# Patient Record
Sex: Female | Born: 1950 | Race: Black or African American | Hispanic: No | Smoking: Current every day smoker
Health system: Southern US, Community
[De-identification: ages and names within clinical notes are randomized; demographics above are authoritative.]

## PROBLEM LIST (undated history)

## (undated) DIAGNOSIS — K219 Gastro-esophageal reflux disease without esophagitis: Secondary | ICD-10-CM

## (undated) DIAGNOSIS — E782 Mixed hyperlipidemia: Secondary | ICD-10-CM

## (undated) DIAGNOSIS — E119 Type 2 diabetes mellitus without complications: Secondary | ICD-10-CM

## (undated) DIAGNOSIS — G4730 Sleep apnea, unspecified: Secondary | ICD-10-CM

## (undated) DIAGNOSIS — G894 Chronic pain syndrome: Secondary | ICD-10-CM

## (undated) DIAGNOSIS — M170 Bilateral primary osteoarthritis of knee: Secondary | ICD-10-CM

## (undated) DIAGNOSIS — E669 Obesity, unspecified: Secondary | ICD-10-CM

## (undated) DIAGNOSIS — D333 Benign neoplasm of cranial nerves: Secondary | ICD-10-CM

## (undated) DIAGNOSIS — F419 Anxiety disorder, unspecified: Secondary | ICD-10-CM

## (undated) DIAGNOSIS — D509 Iron deficiency anemia, unspecified: Secondary | ICD-10-CM

## (undated) DIAGNOSIS — Z9889 Other specified postprocedural states: Secondary | ICD-10-CM

## (undated) DIAGNOSIS — J209 Acute bronchitis, unspecified: Secondary | ICD-10-CM

## (undated) DIAGNOSIS — I10 Essential (primary) hypertension: Secondary | ICD-10-CM

## (undated) DIAGNOSIS — Z720 Tobacco use: Secondary | ICD-10-CM

## (undated) DIAGNOSIS — E1142 Type 2 diabetes mellitus with diabetic polyneuropathy: Secondary | ICD-10-CM

## (undated) DIAGNOSIS — R42 Dizziness and giddiness: Secondary | ICD-10-CM

## (undated) DIAGNOSIS — Z79899 Other long term (current) drug therapy: Secondary | ICD-10-CM

## (undated) DIAGNOSIS — C801 Malignant (primary) neoplasm, unspecified: Secondary | ICD-10-CM

## (undated) DIAGNOSIS — I2510 Atherosclerotic heart disease of native coronary artery without angina pectoris: Secondary | ICD-10-CM

## (undated) HISTORY — DX: Atherosclerotic heart disease of native coronary artery without angina pectoris: I25.10

## (undated) HISTORY — DX: Other specified postprocedural states: Z98.89

## (undated) HISTORY — DX: Malignant (primary) neoplasm, unspecified: C80.1

## (undated) HISTORY — DX: Other long term (current) drug therapy: Z79.899

## (undated) HISTORY — PX: LIPOMA EXCISION: SHX5283

## (undated) HISTORY — DX: Chronic pain syndrome: G89.4

## (undated) HISTORY — DX: Mixed hyperlipidemia: E78.2

## (undated) HISTORY — DX: Bilateral primary osteoarthritis of knee: M17.0

## (undated) HISTORY — DX: Acute bronchitis, unspecified: J20.9

## (undated) HISTORY — DX: Type 2 diabetes mellitus without complications: E11.9

## (undated) HISTORY — DX: Dizziness and giddiness: R42

## (undated) HISTORY — DX: Other specified postprocedural states: Z98.890

## (undated) HISTORY — DX: Essential (primary) hypertension: I10

## (undated) HISTORY — DX: Obesity, unspecified: E66.9

## (undated) HISTORY — DX: Benign neoplasm of cranial nerves: D33.3

## (undated) HISTORY — DX: Tobacco use: Z72.0

## (undated) HISTORY — DX: Type 2 diabetes mellitus with diabetic polyneuropathy: E11.42

---

## 1988-04-12 HISTORY — PX: TOTAL ABDOMINAL HYSTERECTOMY W/ BILATERAL SALPINGOOPHORECTOMY: SHX83

## 1997-10-09 HISTORY — PX: OTHER SURGICAL HISTORY: SHX169

## 1998-08-05 ENCOUNTER — Encounter: Admission: RE | Admit: 1998-08-05 | Discharge: 1998-11-03 | Payer: Self-pay

## 1998-08-09 DIAGNOSIS — Z9889 Other specified postprocedural states: Secondary | ICD-10-CM

## 1998-08-09 HISTORY — DX: Other specified postprocedural states: Z98.89

## 1998-08-09 HISTORY — PX: CARPAL TUNNEL RELEASE: SHX101

## 1998-09-08 HISTORY — PX: CARPAL TUNNEL RELEASE: SHX101

## 1998-09-10 ENCOUNTER — Encounter: Admission: RE | Admit: 1998-09-10 | Discharge: 1998-12-09 | Payer: Self-pay | Admitting: Orthopedic Surgery

## 1998-11-10 ENCOUNTER — Emergency Department (HOSPITAL_COMMUNITY): Admission: EM | Admit: 1998-11-10 | Discharge: 1998-11-10 | Payer: Self-pay

## 1999-01-03 ENCOUNTER — Encounter: Admission: RE | Admit: 1999-01-03 | Discharge: 1999-01-21 | Payer: Self-pay | Admitting: Neurosurgery

## 1999-01-27 ENCOUNTER — Encounter: Admission: RE | Admit: 1999-01-27 | Discharge: 1999-02-24 | Payer: Self-pay

## 1999-04-29 ENCOUNTER — Ambulatory Visit (HOSPITAL_COMMUNITY): Admission: RE | Admit: 1999-04-29 | Discharge: 1999-04-29 | Payer: Self-pay | Admitting: Internal Medicine

## 1999-06-29 ENCOUNTER — Encounter: Payer: Self-pay | Admitting: Emergency Medicine

## 1999-06-29 ENCOUNTER — Emergency Department (HOSPITAL_COMMUNITY): Admission: EM | Admit: 1999-06-29 | Discharge: 1999-06-29 | Payer: Self-pay | Admitting: Emergency Medicine

## 1999-09-27 ENCOUNTER — Encounter: Payer: Self-pay | Admitting: Anesthesiology

## 1999-09-27 ENCOUNTER — Encounter: Admission: RE | Admit: 1999-09-27 | Discharge: 1999-12-26 | Payer: Self-pay | Admitting: Anesthesiology

## 1999-10-25 ENCOUNTER — Encounter: Payer: Self-pay | Admitting: Anesthesiology

## 2000-01-15 ENCOUNTER — Ambulatory Visit (HOSPITAL_COMMUNITY): Admission: RE | Admit: 2000-01-15 | Discharge: 2000-01-15 | Payer: Self-pay | Admitting: Neurosurgery

## 2000-10-09 DIAGNOSIS — M810 Age-related osteoporosis without current pathological fracture: Secondary | ICD-10-CM

## 2002-10-09 HISTORY — PX: CORONARY ANGIOPLASTY WITH STENT PLACEMENT: SHX49

## 2005-03-02 ENCOUNTER — Inpatient Hospital Stay (HOSPITAL_COMMUNITY): Admission: EM | Admit: 2005-03-02 | Discharge: 2005-03-03 | Payer: Self-pay | Admitting: Emergency Medicine

## 2005-03-03 ENCOUNTER — Ambulatory Visit: Payer: Self-pay | Admitting: Cardiology

## 2005-05-10 ENCOUNTER — Ambulatory Visit: Payer: Self-pay | Admitting: Cardiology

## 2005-08-23 ENCOUNTER — Ambulatory Visit: Payer: Self-pay | Admitting: Internal Medicine

## 2005-10-20 ENCOUNTER — Ambulatory Visit: Payer: Self-pay | Admitting: Internal Medicine

## 2005-10-26 ENCOUNTER — Ambulatory Visit: Payer: Self-pay | Admitting: Internal Medicine

## 2005-11-03 ENCOUNTER — Ambulatory Visit: Payer: Self-pay | Admitting: Cardiology

## 2005-11-16 ENCOUNTER — Encounter: Admission: RE | Admit: 2005-11-16 | Discharge: 2006-02-14 | Payer: Self-pay | Admitting: Internal Medicine

## 2005-11-22 ENCOUNTER — Ambulatory Visit: Payer: Self-pay | Admitting: Internal Medicine

## 2005-12-26 ENCOUNTER — Ambulatory Visit: Payer: Self-pay | Admitting: Internal Medicine

## 2006-02-16 ENCOUNTER — Ambulatory Visit: Payer: Self-pay | Admitting: Family Medicine

## 2006-02-27 ENCOUNTER — Encounter: Admission: RE | Admit: 2006-02-27 | Discharge: 2006-02-27 | Payer: Self-pay | Admitting: Family Medicine

## 2006-03-08 ENCOUNTER — Ambulatory Visit: Payer: Self-pay | Admitting: Internal Medicine

## 2006-03-25 ENCOUNTER — Emergency Department (HOSPITAL_COMMUNITY): Admission: EM | Admit: 2006-03-25 | Discharge: 2006-03-25 | Payer: Self-pay | Admitting: Emergency Medicine

## 2006-03-29 ENCOUNTER — Ambulatory Visit: Payer: Self-pay | Admitting: Family Medicine

## 2006-04-17 ENCOUNTER — Ambulatory Visit: Admission: RE | Admit: 2006-04-17 | Discharge: 2006-04-17 | Payer: Self-pay | Admitting: Internal Medicine

## 2006-04-19 ENCOUNTER — Ambulatory Visit: Payer: Self-pay

## 2006-04-24 ENCOUNTER — Ambulatory Visit: Payer: Self-pay | Admitting: Internal Medicine

## 2006-04-27 ENCOUNTER — Ambulatory Visit (HOSPITAL_COMMUNITY): Admission: RE | Admit: 2006-04-27 | Discharge: 2006-04-27 | Payer: Self-pay | Admitting: Internal Medicine

## 2006-05-15 ENCOUNTER — Ambulatory Visit: Payer: Self-pay | Admitting: Internal Medicine

## 2006-05-17 ENCOUNTER — Ambulatory Visit: Payer: Self-pay | Admitting: Cardiology

## 2006-06-01 ENCOUNTER — Ambulatory Visit: Payer: Self-pay | Admitting: Internal Medicine

## 2006-06-03 ENCOUNTER — Emergency Department (HOSPITAL_COMMUNITY): Admission: EM | Admit: 2006-06-03 | Discharge: 2006-06-03 | Payer: Self-pay | Admitting: Emergency Medicine

## 2006-06-05 ENCOUNTER — Ambulatory Visit: Payer: Self-pay | Admitting: Internal Medicine

## 2006-06-12 ENCOUNTER — Ambulatory Visit: Payer: Self-pay | Admitting: Internal Medicine

## 2006-06-19 ENCOUNTER — Encounter: Admission: RE | Admit: 2006-06-19 | Discharge: 2006-07-12 | Payer: Self-pay | Admitting: Orthopedic Surgery

## 2006-08-17 ENCOUNTER — Ambulatory Visit: Payer: Self-pay | Admitting: Internal Medicine

## 2006-08-21 ENCOUNTER — Ambulatory Visit: Payer: Self-pay | Admitting: Internal Medicine

## 2006-09-06 ENCOUNTER — Encounter: Admission: RE | Admit: 2006-09-06 | Discharge: 2006-09-19 | Payer: Self-pay | Admitting: Orthopedic Surgery

## 2006-11-08 ENCOUNTER — Ambulatory Visit: Payer: Self-pay | Admitting: Cardiology

## 2006-11-09 ENCOUNTER — Ambulatory Visit: Payer: Self-pay | Admitting: Internal Medicine

## 2006-11-11 ENCOUNTER — Ambulatory Visit (HOSPITAL_COMMUNITY): Admission: RE | Admit: 2006-11-11 | Discharge: 2006-11-11 | Payer: Self-pay | Admitting: Internal Medicine

## 2006-11-26 ENCOUNTER — Ambulatory Visit: Payer: Self-pay | Admitting: Internal Medicine

## 2006-12-18 ENCOUNTER — Encounter: Admission: RE | Admit: 2006-12-18 | Discharge: 2007-01-23 | Payer: Self-pay | Admitting: Orthopedic Surgery

## 2006-12-20 ENCOUNTER — Ambulatory Visit: Payer: Self-pay | Admitting: Internal Medicine

## 2007-02-08 ENCOUNTER — Ambulatory Visit: Payer: Self-pay | Admitting: Internal Medicine

## 2007-03-20 ENCOUNTER — Ambulatory Visit (HOSPITAL_COMMUNITY): Admission: RE | Admit: 2007-03-20 | Discharge: 2007-03-20 | Payer: Self-pay | Admitting: Internal Medicine

## 2007-04-02 ENCOUNTER — Ambulatory Visit: Payer: Self-pay | Admitting: Internal Medicine

## 2007-05-27 ENCOUNTER — Ambulatory Visit: Payer: Self-pay | Admitting: Cardiology

## 2007-10-15 ENCOUNTER — Ambulatory Visit: Payer: Self-pay | Admitting: Internal Medicine

## 2007-10-15 DIAGNOSIS — I2510 Atherosclerotic heart disease of native coronary artery without angina pectoris: Secondary | ICD-10-CM

## 2007-10-15 DIAGNOSIS — G894 Chronic pain syndrome: Secondary | ICD-10-CM | POA: Insufficient documentation

## 2007-10-15 DIAGNOSIS — M1710 Unilateral primary osteoarthritis, unspecified knee: Secondary | ICD-10-CM

## 2007-10-15 DIAGNOSIS — I10 Essential (primary) hypertension: Secondary | ICD-10-CM

## 2007-10-15 DIAGNOSIS — F17200 Nicotine dependence, unspecified, uncomplicated: Secondary | ICD-10-CM | POA: Insufficient documentation

## 2007-10-15 DIAGNOSIS — J209 Acute bronchitis, unspecified: Secondary | ICD-10-CM

## 2007-10-15 DIAGNOSIS — E782 Mixed hyperlipidemia: Secondary | ICD-10-CM

## 2007-10-15 DIAGNOSIS — E119 Type 2 diabetes mellitus without complications: Secondary | ICD-10-CM | POA: Insufficient documentation

## 2007-10-15 DIAGNOSIS — E1140 Type 2 diabetes mellitus with diabetic neuropathy, unspecified: Secondary | ICD-10-CM

## 2007-10-15 DIAGNOSIS — E669 Obesity, unspecified: Secondary | ICD-10-CM | POA: Insufficient documentation

## 2007-10-15 LAB — CONVERTED CEMR LAB
Bilirubin Urine: NEGATIVE
Blood Glucose, Fingerstick: 104
Glucose, Urine, Semiquant: NEGATIVE
Specific Gravity, Urine: <1.005
Urobilinogen, UA: NEGATIVE
pH: 7.5

## 2007-10-16 ENCOUNTER — Encounter (INDEPENDENT_AMBULATORY_CARE_PROVIDER_SITE_OTHER): Payer: Self-pay | Admitting: Internal Medicine

## 2007-10-17 ENCOUNTER — Encounter (INDEPENDENT_AMBULATORY_CARE_PROVIDER_SITE_OTHER): Payer: Self-pay | Admitting: Internal Medicine

## 2007-10-17 LAB — CONVERTED CEMR LAB
ALT: 8 units/L (ref 0–35)
Albumin: 4.5 g/dL (ref 3.5–5.2)
Basophils Absolute: 0 10*3/uL (ref 0.0–0.1)
CO2: 28 meq/L (ref 19–32)
Calcium: 9.6 mg/dL (ref 8.4–10.5)
Chloride: 102 meq/L (ref 96–112)
Cholesterol: 185 mg/dL (ref 0–200)
Creatinine, Ser: 0.74 mg/dL (ref 0.40–1.20)
Hemoglobin: 13 g/dL (ref 12.0–15.0)
Lymphocytes Relative: 22 % (ref 12–46)
Monocytes Absolute: 0.6 10*3/uL (ref 0.1–1.0)
Neutro Abs: 5.2 10*3/uL (ref 1.7–7.7)
Potassium: 3.8 meq/L (ref 3.5–5.3)
RBC: 4.59 M/uL (ref 3.87–5.11)
RDW: 14 % (ref 11.5–15.5)
Total Protein: 7.7 g/dL (ref 6.0–8.3)

## 2007-10-22 ENCOUNTER — Ambulatory Visit: Payer: Self-pay | Admitting: Internal Medicine

## 2007-10-22 LAB — CONVERTED CEMR LAB: Blood Glucose, Fingerstick: 117

## 2007-11-04 ENCOUNTER — Telehealth (INDEPENDENT_AMBULATORY_CARE_PROVIDER_SITE_OTHER): Payer: Self-pay | Admitting: Internal Medicine

## 2007-11-21 ENCOUNTER — Telehealth (INDEPENDENT_AMBULATORY_CARE_PROVIDER_SITE_OTHER): Payer: Self-pay | Admitting: Internal Medicine

## 2008-04-02 ENCOUNTER — Telehealth (INDEPENDENT_AMBULATORY_CARE_PROVIDER_SITE_OTHER): Payer: Self-pay | Admitting: Internal Medicine

## 2008-04-03 ENCOUNTER — Ambulatory Visit: Payer: Self-pay | Admitting: Internal Medicine

## 2008-04-03 LAB — CONVERTED CEMR LAB
AST: 13 units/L (ref 0–37)
Albumin: 4.2 g/dL (ref 3.5–5.2)
Alkaline Phosphatase: 38 units/L — ABNORMAL LOW (ref 39–117)
Cholesterol: 137 mg/dL (ref 0–200)
HDL: 45 mg/dL (ref >39)
Indirect Bilirubin: 0.3 mg/dL (ref 0.0–0.9)
Total Protein: 7 g/dL (ref 6.0–8.3)
Triglycerides: 71 mg/dL (ref <150)

## 2008-04-16 ENCOUNTER — Ambulatory Visit (HOSPITAL_COMMUNITY): Admission: RE | Admit: 2008-04-16 | Discharge: 2008-04-16 | Payer: Self-pay | Admitting: Internal Medicine

## 2008-04-23 ENCOUNTER — Encounter (INDEPENDENT_AMBULATORY_CARE_PROVIDER_SITE_OTHER): Payer: Self-pay | Admitting: Internal Medicine

## 2008-05-18 ENCOUNTER — Encounter (INDEPENDENT_AMBULATORY_CARE_PROVIDER_SITE_OTHER): Payer: Self-pay | Admitting: Internal Medicine

## 2008-07-03 ENCOUNTER — Ambulatory Visit: Payer: Self-pay | Admitting: Internal Medicine

## 2008-07-05 ENCOUNTER — Telehealth (INDEPENDENT_AMBULATORY_CARE_PROVIDER_SITE_OTHER): Payer: Self-pay | Admitting: Internal Medicine

## 2008-07-07 ENCOUNTER — Telehealth (INDEPENDENT_AMBULATORY_CARE_PROVIDER_SITE_OTHER): Payer: Self-pay | Admitting: Internal Medicine

## 2008-07-17 ENCOUNTER — Telehealth (INDEPENDENT_AMBULATORY_CARE_PROVIDER_SITE_OTHER): Payer: Self-pay | Admitting: Internal Medicine

## 2008-07-20 ENCOUNTER — Encounter (INDEPENDENT_AMBULATORY_CARE_PROVIDER_SITE_OTHER): Payer: Self-pay | Admitting: Internal Medicine

## 2008-07-20 ENCOUNTER — Ambulatory Visit (HOSPITAL_COMMUNITY): Admission: RE | Admit: 2008-07-20 | Discharge: 2008-07-20 | Payer: Self-pay | Admitting: Internal Medicine

## 2008-07-23 ENCOUNTER — Encounter (INDEPENDENT_AMBULATORY_CARE_PROVIDER_SITE_OTHER): Payer: Self-pay | Admitting: Internal Medicine

## 2008-07-30 ENCOUNTER — Encounter (INDEPENDENT_AMBULATORY_CARE_PROVIDER_SITE_OTHER): Payer: Self-pay | Admitting: *Deleted

## 2008-08-02 ENCOUNTER — Encounter (INDEPENDENT_AMBULATORY_CARE_PROVIDER_SITE_OTHER): Payer: Self-pay | Admitting: Internal Medicine

## 2008-08-05 ENCOUNTER — Telehealth (INDEPENDENT_AMBULATORY_CARE_PROVIDER_SITE_OTHER): Payer: Self-pay | Admitting: Internal Medicine

## 2008-08-15 ENCOUNTER — Telehealth (INDEPENDENT_AMBULATORY_CARE_PROVIDER_SITE_OTHER): Payer: Self-pay | Admitting: Family Medicine

## 2008-08-27 ENCOUNTER — Telehealth (INDEPENDENT_AMBULATORY_CARE_PROVIDER_SITE_OTHER): Payer: Self-pay | Admitting: Internal Medicine

## 2008-09-09 ENCOUNTER — Telehealth (INDEPENDENT_AMBULATORY_CARE_PROVIDER_SITE_OTHER): Payer: Self-pay | Admitting: Internal Medicine

## 2008-11-06 ENCOUNTER — Ambulatory Visit: Payer: Self-pay | Admitting: Internal Medicine

## 2008-11-06 DIAGNOSIS — R42 Dizziness and giddiness: Secondary | ICD-10-CM

## 2008-11-06 LAB — CONVERTED CEMR LAB
Blood Glucose, Fingerstick: 104
Blood in Urine, dipstick: NEGATIVE
Hgb A1c MFr Bld: 6.2 %
Microalb, Ur: 1.1 mg/dL (ref 0.00–1.89)
Nitrite: NEGATIVE
Protein, U semiquant: NEGATIVE

## 2008-11-19 ENCOUNTER — Ambulatory Visit: Payer: Self-pay | Admitting: Internal Medicine

## 2008-12-07 ENCOUNTER — Telehealth (INDEPENDENT_AMBULATORY_CARE_PROVIDER_SITE_OTHER): Payer: Self-pay | Admitting: Internal Medicine

## 2008-12-07 LAB — CONVERTED CEMR LAB
BUN: 15 mg/dL (ref 6–23)
CO2: 27 meq/L (ref 19–32)
Calcium: 10 mg/dL (ref 8.4–10.5)
Chloride: 101 meq/L (ref 96–112)
Cholesterol: 162 mg/dL (ref 0–200)
Creatinine, Ser: 0.69 mg/dL (ref 0.40–1.20)
Eosinophils Relative: 4 % (ref 0–5)
HCT: 40.6 % (ref 36.0–46.0)
HDL: 56 mg/dL (ref >39)
Hemoglobin: 12.8 g/dL (ref 12.0–15.0)
Lymphocytes Relative: 26 % (ref 12–46)
MCHC: 31.5 g/dL (ref 30.0–36.0)
Monocytes Absolute: 0.6 10*3/uL (ref 0.1–1.0)
Monocytes Relative: 8 % (ref 3–12)
Neutro Abs: 4.5 10*3/uL (ref 1.7–7.7)
RBC: 4.52 M/uL (ref 3.87–5.11)
RDW: 13.2 % (ref 11.5–15.5)
Total Bilirubin: 0.3 mg/dL (ref 0.3–1.2)
Total CHOL/HDL Ratio: 2.9
Triglycerides: 86 mg/dL (ref <150)
VLDL: 17 mg/dL (ref 0–40)

## 2008-12-10 ENCOUNTER — Encounter: Payer: Self-pay | Admitting: Cardiovascular Disease

## 2008-12-10 ENCOUNTER — Ambulatory Visit: Payer: Self-pay | Admitting: Cardiovascular Disease

## 2008-12-10 DIAGNOSIS — I2510 Atherosclerotic heart disease of native coronary artery without angina pectoris: Secondary | ICD-10-CM

## 2008-12-11 ENCOUNTER — Encounter (INDEPENDENT_AMBULATORY_CARE_PROVIDER_SITE_OTHER): Payer: Self-pay | Admitting: Internal Medicine

## 2008-12-17 ENCOUNTER — Telehealth (INDEPENDENT_AMBULATORY_CARE_PROVIDER_SITE_OTHER): Payer: Self-pay | Admitting: *Deleted

## 2008-12-21 ENCOUNTER — Encounter: Payer: Self-pay | Admitting: Cardiovascular Disease

## 2008-12-21 ENCOUNTER — Ambulatory Visit: Payer: Self-pay

## 2008-12-30 ENCOUNTER — Telehealth (INDEPENDENT_AMBULATORY_CARE_PROVIDER_SITE_OTHER): Payer: Self-pay | Admitting: Internal Medicine

## 2009-01-07 ENCOUNTER — Telehealth (INDEPENDENT_AMBULATORY_CARE_PROVIDER_SITE_OTHER): Payer: Self-pay | Admitting: Internal Medicine

## 2009-01-19 ENCOUNTER — Telehealth (INDEPENDENT_AMBULATORY_CARE_PROVIDER_SITE_OTHER): Payer: Self-pay | Admitting: *Deleted

## 2009-01-25 ENCOUNTER — Encounter (INDEPENDENT_AMBULATORY_CARE_PROVIDER_SITE_OTHER): Payer: Self-pay | Admitting: Internal Medicine

## 2009-01-28 ENCOUNTER — Telehealth (INDEPENDENT_AMBULATORY_CARE_PROVIDER_SITE_OTHER): Payer: Self-pay | Admitting: Internal Medicine

## 2009-02-01 ENCOUNTER — Ambulatory Visit: Payer: Self-pay | Admitting: Internal Medicine

## 2009-02-01 LAB — CONVERTED CEMR LAB
Bilirubin, Direct: 0.1 mg/dL (ref 0.0–0.3)
Indirect Bilirubin: 0.3 mg/dL (ref 0.0–0.9)
LDL Cholesterol: 64 mg/dL (ref 0–99)
Total Bilirubin: 0.4 mg/dL (ref 0.3–1.2)
Total Protein: 7.3 g/dL (ref 6.0–8.3)
VLDL: 21 mg/dL (ref 0–40)

## 2009-02-05 ENCOUNTER — Encounter (INDEPENDENT_AMBULATORY_CARE_PROVIDER_SITE_OTHER): Payer: Self-pay | Admitting: Internal Medicine

## 2009-02-08 ENCOUNTER — Encounter (INDEPENDENT_AMBULATORY_CARE_PROVIDER_SITE_OTHER): Payer: Self-pay | Admitting: Internal Medicine

## 2009-03-09 ENCOUNTER — Encounter (INDEPENDENT_AMBULATORY_CARE_PROVIDER_SITE_OTHER): Payer: Self-pay | Admitting: Internal Medicine

## 2009-03-24 ENCOUNTER — Encounter (INDEPENDENT_AMBULATORY_CARE_PROVIDER_SITE_OTHER): Payer: Self-pay | Admitting: Internal Medicine

## 2009-03-25 ENCOUNTER — Telehealth (INDEPENDENT_AMBULATORY_CARE_PROVIDER_SITE_OTHER): Payer: Self-pay | Admitting: Internal Medicine

## 2009-04-03 ENCOUNTER — Emergency Department (HOSPITAL_COMMUNITY): Admission: EM | Admit: 2009-04-03 | Discharge: 2009-04-03 | Payer: Self-pay | Admitting: Emergency Medicine

## 2009-04-09 ENCOUNTER — Telehealth (INDEPENDENT_AMBULATORY_CARE_PROVIDER_SITE_OTHER): Payer: Self-pay | Admitting: Internal Medicine

## 2009-04-14 ENCOUNTER — Emergency Department (HOSPITAL_COMMUNITY): Admission: EM | Admit: 2009-04-14 | Discharge: 2009-04-14 | Payer: Self-pay | Admitting: Emergency Medicine

## 2009-04-20 ENCOUNTER — Encounter: Admission: RE | Admit: 2009-04-20 | Discharge: 2009-07-01 | Payer: Self-pay | Admitting: Internal Medicine

## 2009-05-05 ENCOUNTER — Encounter (INDEPENDENT_AMBULATORY_CARE_PROVIDER_SITE_OTHER): Payer: Self-pay | Admitting: Internal Medicine

## 2009-05-21 ENCOUNTER — Emergency Department (HOSPITAL_COMMUNITY): Admission: EM | Admit: 2009-05-21 | Discharge: 2009-05-21 | Payer: Self-pay | Admitting: Emergency Medicine

## 2009-06-17 ENCOUNTER — Ambulatory Visit: Payer: Self-pay | Admitting: Cardiovascular Disease

## 2009-06-22 ENCOUNTER — Ambulatory Visit: Payer: Self-pay | Admitting: Family Medicine

## 2009-06-30 ENCOUNTER — Encounter (INDEPENDENT_AMBULATORY_CARE_PROVIDER_SITE_OTHER): Payer: Self-pay | Admitting: Internal Medicine

## 2009-07-19 ENCOUNTER — Encounter (INDEPENDENT_AMBULATORY_CARE_PROVIDER_SITE_OTHER): Payer: Self-pay | Admitting: Internal Medicine

## 2009-08-23 ENCOUNTER — Ambulatory Visit: Payer: Self-pay | Admitting: Physical Medicine & Rehabilitation

## 2009-08-23 ENCOUNTER — Encounter
Admission: RE | Admit: 2009-08-23 | Discharge: 2009-10-07 | Payer: Self-pay | Admitting: Physical Medicine & Rehabilitation

## 2009-08-27 ENCOUNTER — Ambulatory Visit (HOSPITAL_COMMUNITY)
Admission: RE | Admit: 2009-08-27 | Discharge: 2009-08-27 | Payer: Self-pay | Admitting: Physical Medicine & Rehabilitation

## 2009-09-10 ENCOUNTER — Ambulatory Visit: Payer: Self-pay | Admitting: Physical Medicine & Rehabilitation

## 2009-09-16 ENCOUNTER — Encounter (INDEPENDENT_AMBULATORY_CARE_PROVIDER_SITE_OTHER): Payer: Self-pay | Admitting: Internal Medicine

## 2009-09-18 ENCOUNTER — Ambulatory Visit (HOSPITAL_COMMUNITY)
Admission: RE | Admit: 2009-09-18 | Discharge: 2009-09-18 | Payer: Self-pay | Admitting: Physical Medicine & Rehabilitation

## 2009-10-07 ENCOUNTER — Ambulatory Visit: Payer: Self-pay | Admitting: Physical Medicine & Rehabilitation

## 2009-11-02 ENCOUNTER — Encounter
Admission: RE | Admit: 2009-11-02 | Discharge: 2009-11-02 | Payer: Self-pay | Admitting: Physical Medicine & Rehabilitation

## 2009-12-21 ENCOUNTER — Ambulatory Visit (HOSPITAL_COMMUNITY): Admission: RE | Admit: 2009-12-21 | Discharge: 2009-12-21 | Payer: Self-pay | Admitting: Urology

## 2009-12-21 ENCOUNTER — Encounter: Admission: RE | Admit: 2009-12-21 | Discharge: 2009-12-21 | Payer: Self-pay | Admitting: Internal Medicine

## 2009-12-21 ENCOUNTER — Encounter: Payer: Self-pay | Admitting: Cardiovascular Disease

## 2010-01-03 ENCOUNTER — Ambulatory Visit: Payer: Self-pay | Admitting: Cardiovascular Disease

## 2010-01-12 ENCOUNTER — Telehealth (INDEPENDENT_AMBULATORY_CARE_PROVIDER_SITE_OTHER): Payer: Self-pay | Admitting: *Deleted

## 2010-01-17 ENCOUNTER — Encounter (INDEPENDENT_AMBULATORY_CARE_PROVIDER_SITE_OTHER): Payer: Self-pay | Admitting: Urology

## 2010-01-17 ENCOUNTER — Inpatient Hospital Stay (HOSPITAL_COMMUNITY): Admission: RE | Admit: 2010-01-17 | Discharge: 2010-01-20 | Payer: Self-pay | Admitting: Urology

## 2010-02-11 ENCOUNTER — Encounter
Admission: RE | Admit: 2010-02-11 | Discharge: 2010-05-05 | Payer: Self-pay | Admitting: Physical Medicine & Rehabilitation

## 2010-02-11 ENCOUNTER — Telehealth (INDEPENDENT_AMBULATORY_CARE_PROVIDER_SITE_OTHER): Payer: Self-pay | Admitting: Internal Medicine

## 2010-02-15 ENCOUNTER — Ambulatory Visit: Payer: Self-pay | Admitting: Physical Medicine & Rehabilitation

## 2010-03-21 ENCOUNTER — Ambulatory Visit: Payer: Self-pay | Admitting: Physical Medicine & Rehabilitation

## 2010-04-13 ENCOUNTER — Encounter: Admission: RE | Admit: 2010-04-13 | Discharge: 2010-04-13 | Payer: Self-pay | Admitting: Internal Medicine

## 2010-05-05 ENCOUNTER — Ambulatory Visit: Payer: Self-pay | Admitting: Physical Medicine & Rehabilitation

## 2010-05-19 ENCOUNTER — Encounter
Admission: RE | Admit: 2010-05-19 | Discharge: 2010-08-17 | Payer: Self-pay | Source: Home / Self Care | Admitting: Physical Medicine & Rehabilitation

## 2010-05-20 ENCOUNTER — Ambulatory Visit: Payer: Self-pay | Admitting: Physical Medicine & Rehabilitation

## 2010-06-17 ENCOUNTER — Ambulatory Visit: Payer: Self-pay | Admitting: Physical Medicine & Rehabilitation

## 2010-06-27 ENCOUNTER — Ambulatory Visit: Payer: Self-pay | Admitting: Physical Medicine & Rehabilitation

## 2010-07-12 ENCOUNTER — Ambulatory Visit: Payer: Self-pay | Admitting: Cardiovascular Disease

## 2010-07-12 DIAGNOSIS — R609 Edema, unspecified: Secondary | ICD-10-CM

## 2010-07-18 ENCOUNTER — Ambulatory Visit: Payer: Self-pay | Admitting: Internal Medicine

## 2010-07-18 ENCOUNTER — Ambulatory Visit: Payer: Self-pay

## 2010-07-18 ENCOUNTER — Encounter: Payer: Self-pay | Admitting: Cardiovascular Disease

## 2010-07-18 ENCOUNTER — Ambulatory Visit (HOSPITAL_COMMUNITY): Admission: RE | Admit: 2010-07-18 | Discharge: 2010-07-18 | Payer: Self-pay | Admitting: Cardiovascular Disease

## 2010-07-22 ENCOUNTER — Ambulatory Visit: Payer: Self-pay | Admitting: Physical Medicine & Rehabilitation

## 2010-08-04 ENCOUNTER — Ambulatory Visit: Payer: Self-pay | Admitting: Physical Medicine & Rehabilitation

## 2010-08-16 ENCOUNTER — Ambulatory Visit: Payer: Self-pay | Admitting: Cardiovascular Disease

## 2010-08-19 ENCOUNTER — Ambulatory Visit (HOSPITAL_COMMUNITY): Admission: RE | Admit: 2010-08-19 | Discharge: 2010-08-19 | Payer: Self-pay | Admitting: Urology

## 2010-08-30 ENCOUNTER — Encounter
Admission: RE | Admit: 2010-08-30 | Discharge: 2010-11-08 | Payer: Self-pay | Source: Home / Self Care | Attending: Physical Medicine & Rehabilitation | Admitting: Physical Medicine & Rehabilitation

## 2010-09-08 ENCOUNTER — Ambulatory Visit: Payer: Self-pay | Admitting: Physical Medicine & Rehabilitation

## 2010-10-11 ENCOUNTER — Encounter
Admission: RE | Admit: 2010-10-11 | Discharge: 2010-10-17 | Payer: Self-pay | Source: Home / Self Care | Attending: Physical Medicine & Rehabilitation | Admitting: Physical Medicine & Rehabilitation

## 2010-10-17 ENCOUNTER — Ambulatory Visit
Admission: RE | Admit: 2010-10-17 | Discharge: 2010-10-17 | Payer: Self-pay | Source: Home / Self Care | Attending: Physical Medicine & Rehabilitation | Admitting: Physical Medicine & Rehabilitation

## 2010-10-30 ENCOUNTER — Encounter: Payer: Self-pay | Admitting: Internal Medicine

## 2010-10-30 ENCOUNTER — Encounter: Payer: Self-pay | Admitting: Physical Medicine & Rehabilitation

## 2010-11-08 NOTE — Assessment & Plan Note (Signed)
Summary: 1 month rov/sl   Visit Type:  1 month f/u Primary Provider:  Victorino December   CC:  ankles tender/ red spot/ left side neck / face pain.Marland KitchenMarland KitchenMarland KitchenMarland Kitchenpt denies any other caridac complaints.  History of Present Illness: 60 yo AAF with h/o CAD s/p MI and stents x 3 in 2004, HTN, Hyperlipidemia, DM type II who presents today for a routine visit. Echo in March 2010 showed normal LV size and function with EF of 60% and mild aortic valve calcification. Her stress myoview showed no ischemia at that time.  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   She has been doing well. Swelling in feet is unchanged. Echo showed normal LV size and function without any significant valvular problems. She is active and describes no chest pain, SOB, palpitations.  She does describe facial and neck pain yesterday, described as tightness but no associated SOB, palpitaitons or chest pain. Her lower ext swelling improves with compression stockings and leg elevation.   Current Medications (verified): 1)  Celexa 40 Mg  Tabs (Citalopram Hydrobromide) .... One Tablet By Mouth Daily **needs Office Visit** 2)  Aspirin 81 Mg Tbec (Aspirin) .... Take One Tablet By Mouth Daily 3)  Metformin Hcl 1000 Mg Tabs (Metformin Hcl) .... Take 1 Tablet By Mouth Two Times A Day 4)  Diovan Hct 160-12.5 Mg  Tabs (Valsartan-Hydrochlorothiazide) .Marland Kitchen.. 1 By Mouth Two Times A Day 5)  Metoprolol Succinate 50 Mg  Tb24 (Metoprolol Succinate) .Marland Kitchen.. 1 By Mouth Two Times A Day 6)  Nitrolingual 0.4 Mg/spray Soln (Nitroglycerin) .... One Spray Under Tongue Every 5 Minutes As Needed For Chest Pain---May Repeat Times Three 7)  Alendronate Sodium 70 Mg  Tabs (Alendronate Sodium) .Marland Kitchen.. 1 By Mouth Q Week 8)  Simvastatin 40 Mg Tabs (Simvastatin) .Marland Kitchen.. 1 Tab At Bedtime 9)  Omeprazole 40 Mg Cpdr (Omeprazole) .... Take One Tablet By Mouth Once Daily. 10)  Vitamin D 2000 Unit Tabs (Cholecalciferol) .... Take One Tablet By  Mouth Once Daily. 11)  Fish Oil 1200 Mg Caps (Omega-3 Fatty Acids) .Marland Kitchen.. 1 Cap Once Daily 12)  Lyrica 50 Mg Caps (Pregabalin) .Marland Kitchen.. 1 Cap Three Times A Day  Allergies (verified): No Known Drug Allergies  Past History:  Past Medical History: Reviewed history from 12/10/2008 and no changes required. 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 Divorced. Disability mainly for back and chronic intermittent vertigo--since tumor removed--no surgical correction for back. Lives alone, keeps her grandson--28 yo daughter is mother.   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 Tobacco use at 1ppd for 40 years, currently 3-4 cigarettes per day.  Review of Systems       The patient complains of leg swelling.  The patient denies fatigue, malaise, fever, weight gain/loss, vision loss, decreased hearing, hoarseness, chest pain, palpitations, shortness of breath, prolonged cough, wheezing, sleep apnea, coughing up blood, abdominal pain, blood in stool, nausea, vomiting, diarrhea, heartburn, incontinence, blood in urine, muscle weakness, joint pain, rash, skin lesions, headache, fainting, dizziness, depression, anxiety, enlarged lymph nodes, easy bruising or bleeding, and environmental allergies.  Vital Signs:  Patient profile:   60 year old female Menstrual status:  hysterectomy Height:      63 inches Weight:       163.4 pounds BMI:     29.05 Pulse rate:   62 / minute Pulse rhythm:   regular Resp:     18 per minute BP sitting:   102 / 80  (left arm) Cuff size:   large  Vitals Entered By: Danielle Rankin, CMA (August 16, 2010 10:12 AM)  Physical Exam  General:  General: Well developed, well nourished, NAD Musculoskeletal: Muscle strength 5/5 all ext Psychiatric: Mood and affect normal Neck: No JVD, no carotid bruits, no thyromegaly, no lymphadenopathy. Lungs:Clear bilaterally, no wheezes, rhonci, crackles CV: RRR no murmurs, gallops rubs Abdomen: soft, NT, ND, BS present Extremities: 1+ bilateral lower ext  edema, pulses 2+.    Echocardiogram  Procedure date:  07/18/2010  Findings:       Left ventricle: The cavity size was normal. Wall thickness was   normal. Systolic function was normal. The estimated ejection   fraction was in the range of 55% to 60%. Doppler parameters are   consistent with abnormal left ventricular relaxation (grade 1   diastolic dysfunction)  Impression & Recommendations:  Problem # 1:  CAD, NATIVE VESSEL (ICD-414.01) Stable. No changes in therapy.   Her updated medication list for this problem includes:    Aspirin 81 Mg Tbec (Aspirin) .Marland Kitchen... Take one tablet by mouth daily    Metoprolol Succinate 50 Mg Tb24 (Metoprolol succinate) .Marland Kitchen... 1 by mouth two times a day    Nitrolingual 0.4 Mg/spray Soln (Nitroglycerin) ..... One spray under tongue every 5 minutes as needed for chest pain---may repeat times three  Problem # 2:  EDEMA (ICD-782.3) Likely dependent edema. Compression stockings, salt reduction and leg elevation recommended. No evidence of LV dysfunction.   Patient Instructions: 1)  Your physician recommends that you schedule a follow-up appointment in: 6 months 2)  Your physician recommends that you continue on your current medications as directed. Please refer to the Current Medication list given to you today. Prescriptions: NITROLINGUAL 0.4 MG/SPRAY  SOLN (NITROGLYCERIN) One spray under tongue every 5 minutes as needed for chest pain---may repeat times three  #25 x 6   Entered by:   Whitney Maeola Sarah RN   Authorized by:   Verne Carrow, MD   Signed by:   Ellender Hose RN on 08/16/2010   Method used:   [facility]        [facility] [address] [phone]* (retail)       [address]       [place], [place]  [address]       Ph: [phone] or [phone]       Fax: [phone]   RxID:   [id number]

## 2010-11-08 NOTE — Assessment & Plan Note (Signed)
Summary: f16m   Visit Type:  f/u visit Primary Provider:  Sander,Robyn   CC:  No cardiac complaints.  History of Present Illness: 60 yo AAF with h/o CAD s/p MI and stents x 3 in 2004, HTN, Hyperlipidemia, DM type II who presents today for a routine visit. At the visit in March of 2010, I ordered an echo which showed normal LV size and function with EF of 60% and mild aortic valve calcification. Her stress myoview showed no ischemia at that time.  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   She has been feeling great. She is active and describes no chest pain, SOB, palpitations. She has chronic issues with rectal pain and when it subsides, she gets dizzy and weak and passes out. This has been happening for the last ten years. It has not been felt to be cardiac in the past.  Recent carotids as above with no severe disease. Colonoscopy five years ago was ok per patient. She has been taking all of her medications as prescribed. She keeps her grandson and this keeps her busy. She tells me that a spot has been discovered on her kidney and she has an upcoming surgery with planned right nephrectomy on April 11th with Dr. Laverle Patter at [facility].      Current Medications (verified): 1)  Tramadol-Acetaminophen 37.5-325 Mg Tabs (Tramadol-Acetaminophen) .... 2 Tabs 4 Times A Day 2)  Celexa 40 Mg  Tabs (Citalopram Hydrobromide) .Marland Kitchen.. 1 By Mouth Daily 3)  Glipizide Xl 5 Mg Tb24 (Glipizide) .Marland Kitchen.. 1 By Mouth Two Times A Day 4)  Aspirin 325 Mg Tabs (Aspirin) .... Take 1 Tab By Mouth Every Day 5)  Metformin Hcl 1000 Mg Tabs (Metformin Hcl) .... Take 1 Tablet By Mouth Two Times A Day 6)  Niaspan 500 Mg Tbcr (Niacin (Antihyperlipidemic)) .Marland Kitchen.. 1 By Mouth Once Daily 7)  Diovan Hct 160-12.5 Mg  Tabs (Valsartan-Hydrochlorothiazide) .Marland Kitchen.. 1 By Mouth Two Times A Day 8)  Metoprolol Succinate 50 Mg  Tb24 (Metoprolol Succinate) .Marland Kitchen.. 1 By Mouth Two Times A Day 9)   Nitroquick 0.4 Mg  Subl (Nitroglycerin) .Marland Kitchen.. 1 Tab Sl As Needed Chest Pain.  May Repeat Q5 Min X 2 If Not Resolved. 10)  Alendronate Sodium 70 Mg  Tabs (Alendronate Sodium) .Marland Kitchen.. 1 By Mouth Q Week 11)  Simvastatin 80 Mg Tabs (Simvastatin) .Marland Kitchen.. 1 Tab By Mouth Daily 12)  Omeprazole 40 Mg Cpdr (Omeprazole) .... Take One Tablet By Mouth Once Daily. 13)  Vitamin D 2000 Unit Tabs (Cholecalciferol) .... Take One Tablet By Mouth Once Daily.  Allergies (verified): No Known Drug Allergies  Past History:  Past Medical History: Reviewed history from 12/10/2008 and no changes required. 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 Divorced. Disability mainly for back and chronic intermittent vertigo--since tumor removed--no surgical correction for back. Lives alone, keeps her grandson--28 yo daughter is mother.   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 Tobacco use at 1ppd for 40 years, currently 3-4 cigarettes per day.  Review of Systems       The  patient complains of dizziness.  The patient denies fatigue, malaise, fever, weight gain/loss, vision loss, decreased hearing, hoarseness, chest pain, palpitations, shortness of breath, prolonged cough, wheezing, sleep apnea, coughing up blood, abdominal pain, blood in stool, nausea, vomiting, diarrhea, heartburn, incontinence, blood in urine, muscle weakness, joint  pain, leg swelling, rash, skin lesions, headache, fainting, depression, anxiety, enlarged lymph nodes, easy bruising or bleeding, and environmental allergies.    Vital Signs:  Patient profile:   60 year old female Menstrual status:  hysterectomy Height:      63 inches Weight:      157 pounds BMI:     27.91 Pulse rate:   63 / minute BP sitting:   118 / 80  (left arm)  Vitals Entered By: Laurance Flatten CMA (January 03, 2010 9:22 AM)  Physical Exam  General:  General: Well developed, well nourished, NAD HEENT: OP clear, mucus membranes moist Psychiatric: Mood and affect normal Neck: No JVD, no carotid bruits, no thyromegaly, no lymphadenopathy. Lungs:Clear bilaterally, no wheezes, rhonci, crackles CV: RRR no murmurs, gallops rubs Abdomen: soft, NT, ND, BS present Extremities: No edema, pulses 2+.    EKG  Procedure date:  01/03/2010  Findings:      NSR, rate 63 bpm. Septal infarct, unchanged from old EKG.   Impression & Recommendations:  Problem # 1:  CAD, NATIVE VESSEL (ICD-414.01)  Stable. No change in therapy. She will not need further cardiac workup before planned surgical procedure.  OK to proceed to nephrectomy from cardiac standpoint.   Her updated medication list for this problem includes:    Aspirin 325 Mg Tabs (Aspirin) .Marland Kitchen... Take 1 tab by mouth every day    Metoprolol Succinate 50 Mg Tb24 (Metoprolol succinate) .Marland Kitchen... 1 by mouth two times a day    Nitroquick 0.4 Mg Subl (Nitroglycerin) .Marland Kitchen... 1 tab sl as needed chest pain.  may repeat q5 min x 2 if not resolved.  Orders: EKG w/ Interpretation (93000)  Problem # 2:  HYPERTENSION (ICD-401.9) Blood pressure well controlled on current therapy. No changes today.   Her updated medication list for this problem includes:    Aspirin 325 Mg Tabs (Aspirin) .Marland Kitchen... Take 1 tab by mouth every day    Diovan Hct 160-12.5 Mg Tabs (Valsartan-hydrochlorothiazide) .Marland Kitchen... 1 by mouth two times a day    Metoprolol Succinate 50 Mg Tb24  (Metoprolol succinate) .Marland Kitchen... 1 by mouth two times a day  Problem # 3:  DIZZINESS (ICD-780.4) Likely vagal secondary to severe rectal pain. May need colonoscopy to evaluate. Will leave this to primary care.   Patient Instructions: 1)  Your physician recommends that you schedule a follow-up appointment in: 6 months 2)  Your physician recommends that you continue on your current medications as directed. Please refer to the Current Medication list given to you today.

## 2010-11-08 NOTE — Progress Notes (Signed)
Summary: ? PCP  Phone Note Outgoing Call   Summary of Call: Theresa Cline pt - call and see if she is still a pt in this office getting refills on celexa and have asked that she f/u but does not appear that she has not come in Initial call taken by: Lehman Prom FNP,  Feb 11, 2010 4:23 PM  Follow-up for Phone Call        Left message on answering machine for pt to return call at [phone]. Follow-up by: Vesta Mixer CMA,  Feb 14, 2010 12:07 PM  Additional Follow-up for Phone Call Additional follow up Details #1::        Spoke with pt she is no longer out pt and will [facility]. Additional Follow-up by: Vesta Mixer CMA,  Feb 16, 2010 5:36 PM

## 2010-11-08 NOTE — Medication Information (Signed)
Summary: RX Folder/DRUG REVEIW PROGRAM  RX Folder/DRUG REVEIW PROGRAM   Imported By: Arta Bruce 11/18/2009 14:54:32  _____________________________________________________________________  External Attachment:    Type:   Image     Comment:   External Document

## 2010-11-08 NOTE — Consult Note (Signed)
Summary: [facility]  [facility]   Imported By: Marylou Mccoy 03/16/2010 16:43:39  _____________________________________________________________________  External Attachment:    Type:   Image     Comment:   External Document

## 2010-11-08 NOTE — Progress Notes (Signed)
  Faxed 12 lead,Stress over to [facility] pre-opp to fax [phone] [facility]  January 12, 2010 9:33 AM

## 2010-11-08 NOTE — Assessment & Plan Note (Signed)
Summary: 6 month rov.sl   Visit Type:  6 mo f/u Primary Provider:  Victorino December   CC:  edema/legs/feet.....denies any cp or sob.  History of Present Illness: 60 yo AAF with h/o CAD s/p MI and stents x 3 in 2004, HTN, Hyperlipidemia, DM type II who presents today for a routine visit. Echo in March 2010 showed normal LV size and function with EF of 60% and mild aortic valve calcification. Her stress myoview showed no ischemia at that time.  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   She has been feeling great. She is active and describes no chest pain, SOB, palpitations.  She has noticed bilateral lower extremity swelling for the last 3-4 weeks. Her Niaspan was stopped, Zocor was lowered to 40 mg per day and Glipizide was stopped in primary care. Lasix was tried but this did not help with the swellilng.  These medication changes have not helped with the swelling.  Colonoscopy five years ago was ok per patient. She has been taking all of her medications as prescribed.   Current Medications (verified): 1)  Tramadol-Acetaminophen 37.5-325 Mg Tabs (Tramadol-Acetaminophen) .... 2 Tabs 4 Times A Day 2)  Celexa 40 Mg  Tabs (Citalopram Hydrobromide) .... One Tablet By Mouth Daily **needs Office Visit** 3)  Aspirin 325 Mg Tabs (Aspirin) .... Take 1 Tab By Mouth Every Day 4)  Metformin Hcl 1000 Mg Tabs (Metformin Hcl) .... Take 1 Tablet By Mouth Two Times A Day 5)  Diovan Hct 160-12.5 Mg  Tabs (Valsartan-Hydrochlorothiazide) .Marland Kitchen.. 1 By Mouth Two Times A Day 6)  Metoprolol Succinate 50 Mg  Tb24 (Metoprolol Succinate) .Marland Kitchen.. 1 By Mouth Two Times A Day 7)  Nitrolingual 0.4 Mg/spray Soln (Nitroglycerin) .... One Spray Under Tongue Every 5 Minutes As Needed For Chest Pain---May Repeat Times Three 8)  Alendronate Sodium 70 Mg  Tabs (Alendronate Sodium) .Marland Kitchen.. 1 By Mouth Q Week 9)  Simvastatin 40 Mg Tabs (Simvastatin) .Marland Kitchen.. 1 Tab At Bedtime 10)  Omeprazole 40 Mg Cpdr  (Omeprazole) .... Take One Tablet By Mouth Once Daily. 11)  Vitamin D 2000 Unit Tabs (Cholecalciferol) .... Take One Tablet By Mouth Once Daily. 12)  Fish Oil 1200 Mg Caps (Omega-3 Fatty Acids) .Marland Kitchen.. 1 Cap Once Daily  Allergies (verified): No Known Drug Allergies  Past History:  Past Medical History: Reviewed history from 12/10/2008 and no changes required. 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 Divorced. Disability mainly for back and chronic intermittent vertigo--since tumor removed--no surgical correction for back. Lives alone, keeps her grandson--28 yo daughter is mother.   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 Tobacco use at 1ppd for 40 years, currently 3-4 cigarettes per day.  Review of Systems       The patient complains of leg swelling.  The patient denies fatigue, malaise, fever, weight gain/loss, vision loss, decreased hearing, hoarseness, chest pain, palpitations, shortness of breath, prolonged cough, wheezing, sleep apnea, coughing up blood, abdominal pain, blood in stool, nausea, vomiting, diarrhea, heartburn, incontinence, blood in urine, muscle weakness, joint pain, rash, skin lesions, headache, fainting, dizziness,  depression, anxiety, enlarged lymph nodes, easy bruising or bleeding, and environmental allergies.    Vital Signs:  Patient profile:   60 year old  female Menstrual status:  hysterectomy Height:      63 inches Weight:      166 pounds Pulse rate:   64 / minute Pulse rhythm:   regular BP sitting:   118 / 70  (left arm) Cuff size:   regular  Vitals Entered By: Danielle Rankin, CMA (July 12, 2010 8:48 AM)  Physical Exam  General:  General: Well developed, well nourished, NAD Musculoskeletal: Muscle strength 5/5 all ext Psychiatric: Mood and affect normal Neck: No JVD, no carotid bruits, no thyromegaly, no lymphadenopathy. Lungs:Clear bilaterally, no wheezes, rhonci, crackles CV: RRR no murmurs, gallops rubs Abdomen: soft, NT, ND, BS present Extremities: 1+ bilateral lower ext  edema, pulses 2+.    Impression & Recommendations:  Problem # 1:  CAD, NATIVE VESSEL (ICD-414.01) [place]. No angina.   Her updated medication list for this problem includes:    Aspirin 325 Mg Tabs (Aspirin) .Marland Kitchen... Take 1 tab by mouth every day    Metoprolol Succinate 50 Mg Tb24 (Metoprolol succinate) .Marland Kitchen... 1 by mouth two times a day    Nitrolingual 0.4 Mg/spray Soln (Nitroglycerin) ..... One spray under tongue every 5 minutes as needed for chest pain---may repeat times three  Problem # 2:  EDEMA (ICD-782.3) Most likely dependent edema. Not c/w DVT as it is bilateral. Will repeat echo to assess LVEF. Will see her back in 4 weeks to reassess and review echo.   Other Orders: Echocardiogram (Echo)  Patient Instructions: 1)  Your physician recommends that you schedule a follow-up appointment in: 4 weeks. 2)  Your physician recommends that you continue on your current medications as directed. Please refer to the Current Medication list given to you today. 3)  Your physician has requested that you have an echocardiogram.  Echocardiography is a painless test that uses sound waves to create images of your heart. It provides your doctor with information about the size and shape of your heart and how well your heart's chambers and valves are working.  This  procedure takes approximately one hour. There are no restrictions for this procedure. Prescriptions: NITROLINGUAL 0.4 MG/SPRAY SOLN (NITROGLYCERIN) One spray under tongue every 5 minutes as needed for chest pain---may repeat times three  #1 x 6   Entered by:   Danielle Rankin, CMA   Authorized by:   Verne Carrow, MD   Signed by:   Danielle Rankin, CMA on 07/12/2010   Method used:   [facility]        [facility] [address] [phone]* (retail)       [address]       [place], [place]  [address]       Ph: [phone] or [phone]       Fax: [phone]   RxID:   [id number]

## 2010-11-24 ENCOUNTER — Encounter: Payer: Medicare Other | Admitting: Physical Medicine & Rehabilitation

## 2010-11-24 ENCOUNTER — Encounter: Payer: Medicare Other | Attending: Physical Medicine & Rehabilitation

## 2010-11-24 DIAGNOSIS — M48061 Spinal stenosis, lumbar region without neurogenic claudication: Secondary | ICD-10-CM | POA: Insufficient documentation

## 2010-11-24 DIAGNOSIS — IMO0002 Reserved for concepts with insufficient information to code with codable children: Secondary | ICD-10-CM | POA: Insufficient documentation

## 2010-11-24 DIAGNOSIS — M79609 Pain in unspecified limb: Secondary | ICD-10-CM | POA: Insufficient documentation

## 2010-11-24 NOTE — Procedures (Signed)
Theresa Cline, VEILLEUX            ACCOUNT NO.:  [id number]  MEDICAL RECORD NO.:  [id number]           PATIENT TYPE:  LOCATION:                                 FACILITY:  PHYSICIAN:  Erick Colace, M.D.DATE OF BIRTH:  12-Mar-1951  DATE OF PROCEDURE: DATE OF DISCHARGE:                              OPERATIVE REPORT  REASON FOR PROCEDURE:  This is a left L4-5 transforaminal lumbar epidural steroid injection under fluoroscopic guidance.  INDICATIONS:  Lumbar radicular pain, has some multilevel lumbar spinal stenosis.  Pain does run down the left leg exclusively and described as dull, stabbing, aching, interferes with activity at 9/10 level, only partially response to medication management.  Informed consent was obtained after describing risks and benefits of the procedure with the patient.  These include bleeding, bruising and infection.  She elects to proceed and has given written consent.  The patient was placed prone on fluoroscopy table.  Betadine prep, sterile drape, a 25-gauge inch and half needle was used to anesthetize the skin and subcu tissue with 1% lidocaine x2 L.  Then, a 22-gauge 3.5-inch spinal needle was inserted in L4-5 intervertebral foramen, left side. AP, lateral, and oblique images.  Omnipaque 180 under live fluoro demonstrated good nerve root outline subcuticular spread followed by injection of  1 mL of 10 mg/mL dexamethasone and 2 mL of 1% MPF lidocaine.  The patient tolerated the procedure well.  Pre and post injection vitals stable.  Post injection instructions given.  Pre injection pain level 8/10, post injection 0/10.     Erick Colace, M.D. Electronically Signed    AEK/MEDQ  D:  11/24/2010 10:54:51  T:  11/24/2010 11:34:18  Job:  [id number]

## 2010-12-28 LAB — HEMOGLOBIN AND HEMATOCRIT, BLOOD: Hemoglobin: 10.6 g/dL — ABNORMAL LOW (ref 12.0–15.0)

## 2010-12-28 LAB — GLUCOSE, CAPILLARY
Glucose-Capillary: 124 mg/dL — ABNORMAL HIGH (ref 70–99)
Glucose-Capillary: 147 mg/dL — ABNORMAL HIGH (ref 70–99)
Glucose-Capillary: 160 mg/dL — ABNORMAL HIGH (ref 70–99)
Glucose-Capillary: 182 mg/dL — ABNORMAL HIGH (ref 70–99)
Glucose-Capillary: 202 mg/dL — ABNORMAL HIGH (ref 70–99)
Glucose-Capillary: 231 mg/dL — ABNORMAL HIGH (ref 70–99)
Glucose-Capillary: 87 mg/dL (ref 70–99)

## 2010-12-28 LAB — BASIC METABOLIC PANEL
BUN: 5 mg/dL — ABNORMAL LOW (ref 6–23)
CO2: 27 mEq/L (ref 19–32)
CO2: 29 mEq/L (ref 19–32)
CO2: 30 mEq/L (ref 19–32)
Calcium: 9.1 mg/dL (ref 8.4–10.5)
Chloride: 103 mEq/L (ref 96–112)
Creatinine, Ser: 1.14 mg/dL (ref 0.4–1.2)
GFR calc non Af Amer: >60 mL/min (ref >60)
Glucose, Bld: 154 mg/dL — ABNORMAL HIGH (ref 70–99)
Glucose, Bld: 168 mg/dL — ABNORMAL HIGH (ref 70–99)
Glucose, Bld: 90 mg/dL (ref 70–99)
Potassium: 3.8 mEq/L (ref 3.5–5.1)
Potassium: 4.1 mEq/L (ref 3.5–5.1)
Potassium: 4.6 mEq/L (ref 3.5–5.1)
Sodium: 133 mEq/L — ABNORMAL LOW (ref 135–145)
Sodium: 134 mEq/L — ABNORMAL LOW (ref 135–145)
Sodium: 137 mEq/L (ref 135–145)

## 2010-12-28 LAB — CBC
HCT: 35.1 % — ABNORMAL LOW (ref 36.0–46.0)
Hemoglobin: 11.6 g/dL — ABNORMAL LOW (ref 12.0–15.0)
MCHC: 32.9 g/dL (ref 30.0–36.0)
RDW: 14.1 % (ref 11.5–15.5)

## 2010-12-30 ENCOUNTER — Other Ambulatory Visit (HOSPITAL_COMMUNITY): Payer: Self-pay | Admitting: Internal Medicine

## 2010-12-30 DIAGNOSIS — Z1231 Encounter for screening mammogram for malignant neoplasm of breast: Secondary | ICD-10-CM

## 2011-01-02 ENCOUNTER — Encounter: Payer: Medicare Other | Attending: Physical Medicine & Rehabilitation

## 2011-01-02 ENCOUNTER — Ambulatory Visit: Payer: Medicare Other | Admitting: Physical Medicine & Rehabilitation

## 2011-01-02 DIAGNOSIS — IMO0002 Reserved for concepts with insufficient information to code with codable children: Secondary | ICD-10-CM | POA: Insufficient documentation

## 2011-01-02 DIAGNOSIS — M79609 Pain in unspecified limb: Secondary | ICD-10-CM | POA: Insufficient documentation

## 2011-01-02 DIAGNOSIS — M48061 Spinal stenosis, lumbar region without neurogenic claudication: Secondary | ICD-10-CM | POA: Insufficient documentation

## 2011-01-15 LAB — GLUCOSE, CAPILLARY: Glucose-Capillary: 88 mg/dL (ref 70–99)

## 2011-01-16 LAB — DIFFERENTIAL
Basophils Absolute: 0 10*3/uL (ref 0.0–0.1)
Basophils Relative: 0 % (ref 0–1)
Monocytes Absolute: 0.7 10*3/uL (ref 0.1–1.0)
Neutro Abs: 6.3 10*3/uL (ref 1.7–7.7)
Neutrophils Relative %: 68 % (ref 43–77)

## 2011-01-16 LAB — CBC
HCT: 38.9 % (ref 36.0–46.0)
Hemoglobin: 13.1 g/dL (ref 12.0–15.0)
MCV: 89.6 fL (ref 78.0–100.0)
RBC: 4.34 MIL/uL (ref 3.87–5.11)
WBC: 9.2 10*3/uL (ref 4.0–10.5)

## 2011-01-16 LAB — POCT I-STAT, CHEM 8
Calcium, Ion: 1.21 mmol/L (ref 1.12–1.32)
Chloride: 98 mEq/L (ref 96–112)
HCT: 41 % (ref 36.0–46.0)
Potassium: 3.5 mEq/L (ref 3.5–5.1)
Sodium: 139 mEq/L (ref 135–145)

## 2011-01-16 LAB — URINALYSIS, ROUTINE W REFLEX MICROSCOPIC
Ketones, ur: NEGATIVE mg/dL
Nitrite: NEGATIVE
pH: 5 (ref 5.0–8.0)

## 2011-01-16 LAB — GLUCOSE, CAPILLARY

## 2011-01-17 ENCOUNTER — Ambulatory Visit (HOSPITAL_COMMUNITY)
Admission: RE | Admit: 2011-01-17 | Discharge: 2011-01-17 | Disposition: A | Discharge status: Home / Self Care | Payer: Medicare Other | Source: Ambulatory Visit | Attending: Internal Medicine | Admitting: Internal Medicine

## 2011-01-17 DIAGNOSIS — Z1231 Encounter for screening mammogram for malignant neoplasm of breast: Secondary | ICD-10-CM | POA: Insufficient documentation

## 2011-02-03 ENCOUNTER — Ambulatory Visit: Payer: Medicare Other | Admitting: Physical Medicine & Rehabilitation

## 2011-02-03 ENCOUNTER — Encounter: Payer: Medicare Other | Attending: Physical Medicine & Rehabilitation

## 2011-02-03 DIAGNOSIS — IMO0002 Reserved for concepts with insufficient information to code with codable children: Secondary | ICD-10-CM

## 2011-02-03 DIAGNOSIS — M48061 Spinal stenosis, lumbar region without neurogenic claudication: Secondary | ICD-10-CM

## 2011-02-03 DIAGNOSIS — M1710 Unilateral primary osteoarthritis, unspecified knee: Secondary | ICD-10-CM

## 2011-02-03 DIAGNOSIS — M7650 Patellar tendinitis, unspecified knee: Secondary | ICD-10-CM

## 2011-02-03 DIAGNOSIS — M79609 Pain in unspecified limb: Secondary | ICD-10-CM | POA: Insufficient documentation

## 2011-02-03 NOTE — Assessment & Plan Note (Signed)
CHIEF COMPLAINT:  Bilateral knee pain.  HISTORY:  A 60 year old female with lumbar spinal stenosis.  She has had left lower extremity radicular discomfort and did well after left L4 selective nerve root blocks on January 02, 2011.  She has known DJD of the knees, rated as moderate mainly affecting the medial compartment and more apparent on the right side than the left side radiographically based on x-rays in 2007, however, more symptomatic on the left side currently.  She underwent a blinded injection of the left knee at her primary care physician's office, but this was not helpful for her.  She continues have knee pain and was told that she may need surgery at some point, but she has definitively ruled that out.  She has not tried any other conservative care such as physical therapy.  I reviewed her x-rays in 2007, and shows some medial joint line, moderate degenerative changes, small osteophytes.  No subchondral sclerosis.  No flattening of the femoral condyles.  Pain diagram shows rather diffuse pain, but once again she is really isolating to the knee more or less at this point.  REVIEW OF SYSTEMS:  Numbness, tingling, which have improved in the left lower extremity, confusion, anxiety, spasms, dizziness, nausea, vomiting, poor appetite.  She follows up with Dr. Myrtie Soman, for her renal carcinoma check up 6- month.  She follows with her primary doctor for her diabetes.  Current pain meds include Ultracet two p.o. t.i.d.  She is off the gabapentin because it caused her some swelling.  PHYSICAL EXAMINATION:  MUSCULOSKELETAL:  Knees have pain bilaterally to medial and lateral joint line palpation as well as palpating the quadriceps tendon and patellar tendon.  No posterior pain.  No pain along the hamstring insertion sites or the hamstring tendons.  Given her body habitus, difficult to appreciate any effusion.  Lower extremity strength is normal.  IMPRESSION:  Bilateral knee  osteoarthritis.  She may have a patellar or plus/minus quadriceps tendonitis superimposed.  PLAN: 1. We will send her to physical therapy one time a week x4 weeks for     quad strengthening and then instruct home exercise. 2. I will see her back for left knee injection under ultrasound     guidance if there is a significant suprapatellar effusion can drain     that first.  Discussed with patient, agrees with plan.  Recheck     blood pressure today, if it is still elevated, follow up with     primary MD, Dr. Nicholos Johns.     Erick Colace, M.D. Electronically Signed   AEK/MedQ D:  02/03/2011 10:46:43  T:  02/03/2011 22:11:04  Job #:  [id number]  cc:   Georgianne Fick, M.D. Fax: [phone]

## 2011-02-16 ENCOUNTER — Ambulatory Visit
Payer: Medicare Other | Attending: Physical Medicine & Rehabilitation | Admitting: Rehabilitative and Restorative Service Providers"

## 2011-02-16 DIAGNOSIS — M25669 Stiffness of unspecified knee, not elsewhere classified: Secondary | ICD-10-CM | POA: Insufficient documentation

## 2011-02-16 DIAGNOSIS — M25569 Pain in unspecified knee: Secondary | ICD-10-CM | POA: Insufficient documentation

## 2011-02-16 DIAGNOSIS — IMO0001 Reserved for inherently not codable concepts without codable children: Secondary | ICD-10-CM | POA: Insufficient documentation

## 2011-02-16 DIAGNOSIS — M6281 Muscle weakness (generalized): Secondary | ICD-10-CM | POA: Insufficient documentation

## 2011-02-20 ENCOUNTER — Ambulatory Visit: Payer: Medicare Other | Admitting: Physical Therapy

## 2011-02-21 NOTE — Assessment & Plan Note (Signed)
[facility]ALTHCARE                            CARDIOLOGY OFFICE NOTE   Theresa Cline, Theresa Cline                   MRN:          [id number]  DATE:05/27/2007                            DOB:          [birth year]    PRIMARY CARE PHYSICIAN:  Marcene Duos, M.D.   REASON FOR VISIT:  Followup coronary artery disease.   HISTORY OF PRESENT ILLNESS:  Theresa Cline was in the office back in  January. She has coronary artery disease status post previous  interventions in 2004, based on limited information. She has had  reassuring ischemic evaluation here including a reassuring Myoview in  July of 2007, with normal ejection fraction of 65%. She is not reporting  any significant problems with angina or limiting dyspnea.   Her electrocardiogram today shows sinus rhythm with decreased  anteroseptal Q-waves which are old and nonspecific ST-T wave changes.   I reviewed her medications which are outlined below. Dr.  Delrae Alfred has  been following lipids and diabetes.   ALLERGIES:  No known drug allergies.   MEDICATIONS:  1. Aspirin 325 mg p.o. daily.  2. Metoprolol 50 mg p.o. b.i.d.  3. Diovan/hydrochlorothiazide 160/12.5 mg p.o. b.i.d.  4. Glipizide 5 mg p.o. b.i.d.  5. Niaspan 500 mg p.o. q nightly.  6. Prevacid 30 mg p.o. daily.  7. Simvastatin p.o. q nightly.  8. Fosamax 75 mg weekly.  9. Metformin 1000 mg p.o. b.i.d.  10.Calcium with vitamin D 600 mg p.o. daily.  11.Citalopram 40 mg p.o. daily.   REVIEW OF SYSTEMS:  As described in History of Present Illness.   PHYSICAL EXAMINATION:  Blood pressure 112/65, heart rate 62, Weight: 172  pounds, down from 186. The patient is comfortable and in no acute  distress.  NECK: Reveals no elevated jugular pressure, without bruits.  LUNGS:  Are clear without labored breathing at rest.  CARDIAC: Reveals a regular rate and rhythm without loud murmur or  gallop.  EXTREMITIES: Show no significant pitting edema.   IMPRESSIONS AND RECOMMENDATIONS:  1. Coronary artery disease as outlined above. The patient is stable      symptomatically and we will plan to continue medical therapy with      symptom observation over the next six months. No specific changes      made today.  2. Otherwise, continue regular followup with Dr.  Delrae Alfred.     Theresa Sidle, MD  Electronically Signed    SGM/MedQ  DD: 05/27/2007  DT: 05/27/2007  Job #: [id number]   cc:   Marcene Duos, M.D.

## 2011-02-22 ENCOUNTER — Ambulatory Visit: Payer: Medicare Other

## 2011-02-24 NOTE — Assessment & Plan Note (Signed)
[facility]ALTHCARE                              CARDIOLOGY OFFICE NOTE   Theresa Cline, Theresa Cline                   MRN:          [id number]  DATE:05/17/2006                            DOB:          [birth year]    REASON FOR VISIT:  Routine follow up.   HISTORY OF PRESENT ILLNESS:  Theresa Cline was last seen in the office in  January.  Her history is outlined in my previous note.  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  She denies angina and  limiting shortness of breath and in fact had a recent followup myocardial  perfusion study on April 19, 2006 indicating no evidence of ischemia with a  normal ejection fraction of 65%.  Her medical regimen is stable and is  outlined below.  She does state that she is being considered for other  potential surgery on her left foot and is also due to see an orthopedic  surgery about her knees.  No surgical plans are in place as yet, however.   ALLERGIES:  No known drug allergies.   PRESENT MEDICATIONS:  1. Aspirin 325 mg p.o. q.d.  2. Metoprolol 150 mg p.o. b.i.d.  3. Diovan/HCT 160/12.5 mg p.o. b.i.d.  4. Glimepiride 5 mg p.o. b.i.d.  5. Niaspan 500 mg p.o. q.h.s.  6. Prevacid 30 mg p.o. q.d.  7. Zocor 20 mg p.o. q.h.s.  8. Celexa 20 mg p.o. q.d.  9. Fosamax 75 mg p.o. weekly.  10.Metformin 1000 mg p.o. b.i.d.  11.Nitro spray p.r.n.  12.Vicodin p.r.n.   REVIEW OF SYMPTOMS:  __________ .  She has bilateral knee pain when she  walks.  She is having no palpitations, dizziness or syncope.  She has had  some nausea recently.   PHYSICAL EXAMINATION:  VITAL SIGNS:  Blood pressure is 122/80, heart rate is  66, weight is 201 pounds which is down from 210.  NECK:  Examination of the neck reveals no jugular venous pressure.  LUNGS:  Clear without labored breathing.  CARDIAC:  A regular rate and rhythm, somewhat distant heart  sounds, no loud  murmur or S3 gallop.  EXTREMITIES:  No significant pitting edema.   IMPRESSION:  1. History of coronary artery disease status post previous percutaneous      interventions in 2004 at a facility in [place], [place] (details not      available).  A recent follow up Myoview study was negative for ischemia      and showed a normal ejection fraction of 65%.  Will therefore plan to      continue medical therapy and symptom observation with an office visit      over the next six months.  2. Hyperlipidemia and type 2 diabetes mellitus, followed by Dr. Concepcion Elk.      The patient states she is due for a lipid profile later this month.      Her goal LDL cholesterol should be in the 70-80 range.  Theresa Sidle, MD    SGM/MedQ  DD:  05/17/2006  DT:  05/17/2006  Job #:  [id number]   cc:   Fleet Contras, MD

## 2011-02-24 NOTE — Discharge Summary (Signed)
[name], Cline NO.:  [id number]   MEDICAL RECORD NO.:  [id number]          PATIENT TYPE:  INP   LOCATION:  3732                         FACILITY:  [facility]   PHYSICIAN:  Jonelle Sidle, M.D. LHCDATE OF BIRTH:  1951-10-04   DATE OF ADMISSION:  03/02/2005  DATE OF DISCHARGE:  03/03/2005                                 DISCHARGE SUMMARY   PROCEDURES:  Adenosine Myoview Mar 03, 2005.   REASON FOR ADMISSION:  [name] is a 60 year old female with reported  history of coronary artery disease, status post prior stenting in September  and October of 2004, at [facility] in [place], [place], who  presented to the emergency room with chest pain.  Please refer to admission  note for full details.   LABORATORY DATA:  Hemoglobin 11.6, hematocrit 34, MCV 85, WBC 5.7, platelets  211,000 on admission, potassium 3.5, glucose 92, BUN 11, creatinine 0.9,  normal liver enzymes on admission.  Cardiac enzymes:  Normal CPK-MB and  troponin-I markers x3.  Lipid profile:  Total cholesterol 123, triglycerides  109, HDL 38, LDL 63.  Iron profile:  Decreased iron at 36, normal ferritin.   Admission chest x-ray:  No acute changes.   HOSPITAL COURSE:  The patient was admitted for overnight observation and  further evaluation of chest pain in the setting of a prior history of  coronary artery disease, status post previous interventions.   Serial cardiac markers were all within normal limits.   The patient was thus referred for a pharmacologic stress Myoview test  performed on the day of subsequent discharge.  The results were reviewed by  Dr. Dietrich Pates, who reported this as a negative study.   The patient was thus cleared for discharge.   DISCHARGE MEDICATIONS:  The patient was instructed to resume all previous  home medications.  Discharge medications are:  1.  Aspirin 325 mg p.o. daily.  2.  Metformin 1 g b.i.d.  3.  Avandia 4 mg b.i.d.  4.  Metoprolol 50 mg  b.i.d.  5.  Diovan 160/12.5 mg b.i.d.  6.  Altace 10 mg daily.  7.  Flexeril 10 mg t.i.d.  8.  Glipizide ER 5 mg b.i.d.  9.  Niaspan 500 mg q.h.s.  10. Darvocet-N 100 b.i.d. and p.r.n.  11. Amitriptyline 100 mg q.h.s.  12. Lexapro 10 mg daily.  13. Crestor 10 mg daily.  14. Zetia 10 mg daily.  15. Prilosec 20 mg daily.  16. Nitrostat p.r.n.   DISCHARGE INSTRUCTIONS:  The patient will follow up with Dr. Simona Huh on  Wednesday, March 15, 2005, at 3:15 p.m.   The patient is also strongly instructed to establish with a primary care  physician here in [place].   DISCHARGE DIAGNOSES:  1.  Nonischemic chest pain.      1.  Normal serial cardiac markers.      2.  Negative adenosine Myoview on Mar 03, 2005.  2.  Coronary artery disease.      1.  Status post prior stenting September and October 2004 ([facility], [place]).  3.  Multiple cardiac risk factors.      1.  Hypertension.      2.  Hyperlipidemia.      3.  History of tobacco.      4.  Type 2 diabetes mellitus.      5.  Age.  4.  Gastroesophageal reflux disease.  5.  Anxiety disorder.  6.  Normocytic anemia.      GS/MEDQ  D:  03/03/2005  T:  03/04/2005  Job:  [id number]

## 2011-02-24 NOTE — Assessment & Plan Note (Signed)
[facility]ALTH[facility]ARE                            CARDIOLOGY OFFICE NOTE   Theresa, Cline                   MRN:          [id number]  DATE:11/08/2006                            DOB:          1950-12-25    PRIMARY CARE:  By Dr. Julieanne Manson at [facility].   REASON FOR VISIT:  Preoperative evaluation.   HISTORY OF PRESENT ILLNESS:  I last saw Theresa Cline back in August of  2007.  She has a history of previous percutaneous interventions in 2004  at a facility in [place], [place].  The details of this are not entirely  clear.  She did have a followup myocardial perfusion study in July of  2007, which was normal, showing no evidence of ischemia with a normal  ejection fraction of 65%.  She has not been bothered by angina or  limiting dyspnea on exertion.  She tells me that she has had some falls  and injured her left knee.  She is apparently to undergo arthroscopic  knee surgery in late February with Dr. Ranell Patrick.  Her electrocardiogram  today shows sinus bradycardia at 57 b.p.m. with old septal Q waves and  nonspecific T wave changes.  Her medications were reviewed.   ALLERGIES:  NO KNOWN DRUG ALLERGIES.   PRESENT MEDICATIONS:  1. Aspirin 325 mg p.o. daily.  2. Metoprolol 50 mg p.o. b.i.d.  3. Diovan HCT 160/12.5 mg p.o. b.i.d.  4. Glipizide 5 mg p.o. b.i.d.  5. Niaspan 500 mg p.o. q.h.s.  6. Prevacid 30 mg p.o. daily.  7. Simvastatin 20 mg p.o. q.h.s.  8. Fosamax 75 mg weekly.  9. Metformin 1000 mg p.o. b.i.d.  10.Fiber supplements.  11.Calcium with Vitamin D 600 mg p.o. daily.  12.Citalopram 40 mg p.o. daily.  13.Nitroglycerin spray p.r.n.  14.Vicodin q.6h. p.r.n.   REVIEW OF SYSTEMS:  As described in the History of Present Illness.   EXAMINATION:  Blood pressure is 122/78, heart rate is 58, weight is 186  pounds.  The patient is comfortable and in no acute distress.  She  ambulates with a 4-point cane.  She is limited by leg  discomfort.  HEENT:  Conjunctiva is normal.  Oropharynx is clear.  NECK:  Supple without elevated jugular venous pressure without bruits,  no thyromegaly is noted.  LUNGS:  Essentially clear without labored breathing.  CARDIAC:  A regular rate and rhythm without loud murmur or S3 gallop.  ABDOMEN:  Soft, nontender, normoactive bowel sounds.  EXTREMITIES:  No significant pitting edema.  Distal pulses are 2+.  Skin  warm and dry.  NEURO/PSYCHIATRIC:  The patient is alert and oriented x3.   IMPRESSION AND RECOMMENDATIONS:  1. History of coronary disease with percutaneous interventions in 2004      at an outside facility, details not available.  Followup myocardial      perfusion study in July of last year showed no evidence of scar or      ischemia, with a normal ejection fraction of 65% and she is not      manifesting any active angina or heart failure symptoms.  Her  medical regimen is reasonable and electrocardiogram is stable.  I      think it would be, therefore, reasonable to proceed with planned      left knee arthroscopic surgery without any other additional cardiac      testing with an acceptable perioperative risk.  She could hold      aspirin as needed temporarily for this procedure, but I would      suggest reinstituting it when she has healed postoperatively.  We      will otherwise plan to see her back in 6 months for symptom review.      She will continue to follow with Dr. Delrae Alfred.  2. Further plans to follow.     Jonelle Sidle, MD  Electronically Signed    SGM/MedQ  DD: 11/08/2006  DT: 11/08/2006  Job #: [id number]   cc:   Julieanne Manson, Dr.  Almedia Balls. Ranell Patrick, M.D.

## 2011-02-24 NOTE — H&P (Signed)
NAMEMarland Kitchen  KYNLEIGH, ARTZ NO.:  [id number]   MEDICAL RECORD NO.:  [id number]          PATIENT TYPE:  INP   LOCATION:  1831                         FACILITY:  [facility]   PHYSICIAN:  Jonelle Sidle, M.D. LHCDATE OF BIRTH:  Feb 26, 1951   DATE OF ADMISSION:  03/02/2005  DATE OF DISCHARGE:                                HISTORY & PHYSICAL   REASON FOR ADMISSION:  Ms. Volcy is a 60 year old female, recently  relocated here in [place] from [place], [place], with reported history of  coronary artery disease.  She presents to the emergency room with chest  discomfort, and is now referred to [place] for further evaluation.   The patient presents with no documentation of her cardiac history; however,  she reports having undergo prior stenting (times two) in September 2004,  followed by an additional stent the next month.  All of this again taking  place a [place] in [place], [place].   The patient also reports having been hospitalized overnight this past  February at he said hospital, and then discharged the following morning  after having ruled out for myocardial infarction.  I asked her if she has  had any repeat catheterizations since 2004, and she denies this.  However,  she does report having had a follow up stress test, but does not recall when  or where.  The patient reports having done well since her hospitalization  for chest pain this past February.  However, this morning at approximately  10:30 a.m., while sitting in a chair, she developed sudden anterior chest  pain (9/10), which she states was reminiscent of her past presentations.  However, there was no radiation to the upper extremities or neck/jaw.  She  also had no associated diaphoresis or dyspnea.  She reports having chronic  nausea.  She took an initial dose of nitroglycerin spray, with no reported  relief; however, her chest discomfort, which initially lasted only a few  seconds in duration,  then recurred, and she took a second dose of  nitroglycerin spray, at this time with reported improvement.  She activated  EMS, and was transported to the emergency room.  She was treated with 4 baby  aspirin en route, and has not had any more nitroglycerin nor additional  therapies here in the emergency room.  She reports being pain free on  arrival, and has not had any recurrent chest pain.   On presentation, electrocardiogram reveals normal sinus rhythm with no acute  changes.  Initial cardiac markers are normal.   ALLERGIES:  No known drug allergies.   HOME MEDICATIONS:  1.  Metformin 1000 mg b.i.d.  2.  Avandia 4 mg b.i.d.  3.  Aspirin 325 mg daily.  4.  Metoprolol 50 mg b.i.d.  5.  Diovan 160/12.5 mg b.i.d.  6.  Altace 10 mg daily.  7.  Flexeril 10 mg t.i.d.  8.  Glipizide ER 5 mg b.i.d.  9.  Niaspan 500 mg q.h.s.  10. Darvocet-N 100 one tablet b.i.d. and p.r.n.  11. Amitriptyline 100 mg q.h.s.  12. Lexapro 10 mg daily.  13. Crestor 10 mg  daily.  14. Zetia 10 mg daily.  15. Prilosec 20 mg daily.  16. Nitrostat as directed.   PAST MEDICAL HISTORY:  1.  Coronary artery disease (as described above; the patient reports being      on Plavix since undergoing stenting until February of this year).  2.  Type 2 diabetes mellitus.  3.  Hypertension.  4.  Hyperlipidemia.  5.  History of tobacco smoking.  6.  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  7.  She has history of panic attacks in 2000.  8.  Degenerative joint disease of the spine.  9.  Vertigo.  10. Status post hysterectomy.  11. Cesarean section (times three).  12. She was diagnosed with hemorrhoids in March of this year, with a      reportedly negative colonoscopy.   SOCIAL HISTORY:  The patient currently living in [place] with one of her  three daughters.  She has not  smoked to since August of 2004 (prior history  of smoking since the age of 49).  Denies alcohol use.   FAMILY HISTORY:  Mother, age 62, alive and well.  Father deceased in his  early 18s from complications from leukemia.  Her siblings have no know  coronary artery disease, history of hypertension.   REVIEW OF SYSTEMS:  Denies exertional chest discomfort, but has chronic  dyspnea.  Denies orthopnea, paroxysmal nocturnal dyspnea, or  tachypalpitations.  Has chronic lower extremity edema.  Denies any history  of hospitalization for congestive heart failure.  Denies history of stroke.  Has chronic nausea.  Has reflux symptoms.  Denies any recent evidence of  upper or lower GI bleed.  The remaining systems negative.   PHYSICAL EXAMINATION:  VITAL SIGNS:  Blood pressure 103/55, pulse 79 and  regular, respirations 31, temperature 98.3.  SAO2 of 96% on room air.  GENERAL:  A 60 year old female, obese, in no apparent distress.  HEENT:  Normocephalic and atraumatic.  NECK:  Bilateral carotid pulses without bruits.  Unable to assess for  jugular venous distention.  LUNGS:  Clear to auscultation in all fields.  HEART:  Regular rate and rhythm (S1 and S2).  Positive S4.  No murmurs.  ABDOMEN:  Protuberant, nontender, with intact bowel sounds.  No bruits.  EXTREMITIES:  Preserved bilateral femoral pulses with soft bilateral bruits.  Diminished but palpable dorsalis pedis pulses with 1+ pedal edema.  NEUROLOGIC:  Flat affect, but no focal deficits.   ADMISSION CHEST X-RAY:  Bibasilar atelectasis.   ELECTROCARDIOGRAM:  Normal sinus rhythm at 69 beats per minute with normal  axis.  Q wave in lead V1.  No acute changes.   LABORATORY DATA:  Hemoglobin 11.6, hematocrit 34, MCV 85.  WBC 5.7,  platelets 211, sodium 140, potassium 3.5, BUN 11, creatinine 0.9, glucose  92.  Liver enzymes normal.  Albumin 3.4.  Cardiac enzymes (POC):  MB less than 1.0.  Troponin I less than 0.05.  INR of 1.0.    IMPRESSION:  1.  Chest pain/known coronary artery disease.      1.  Relieved with nitroglycerin.  2.  Coronary artery disease.      1.  Status post prior stenting in September and October 2004 [place],          [place]).      2.  Treated with Plavix greater than 1 year.  3.  Multiple cardiac risk factors.      1.  Hypertension.      2.  Hyperlipidemia.      3.  History of tobacco.      4.  Type 2 diabetes mellitus.      5.  Age.  4.  Gastroesophageal reflux disease.  5.  Anxiety disorder.  6.  Normocytic anemia.   PLAN:  The patient will be admitted to a telemetry bed for continued monitor  and further evaluation.  We will cycle cardiac markers to rule out  myocardial infarction.  The patient is on a very good medication regimen,  which we will continue, except for Diovan, given that she is already on  Altace.  We will reassess the need for both of these medications prior to  discharge.  Additionally, she will be started on IV heparin.  Intravenous  nitroglycerin will also be added if she has any recurrent pain.  In addition  to cardiac markers, we will repeat a CBC in the morning, check an iron  profile, and stools for guaiac, TSH level, and fasting lipid profile in the  morning.   Recommendation is to proceed with cardiac catheterization in the morning, if  serial cardiac markers are abnormal.  If, however, the patient rules out for  myocardial infarction, then plan will be to proceed with an adenosine  Myoview.  We will arrange for this to be done here in the hospital.  Of  note, however, the patient will need to be reassessed with respect to her  clinical status in the morning, prior to proceeding with the scheduled  stress test.  Additionally, we will require prior cardiac catheterization  records from [facility] in [place], [place].      GS/MEDQ  D:  03/02/2005  T:  03/02/2005  Job:  [id number]

## 2011-02-28 ENCOUNTER — Ambulatory Visit: Payer: Medicare Other | Admitting: Rehabilitative and Restorative Service Providers"

## 2011-03-02 ENCOUNTER — Ambulatory Visit: Payer: Medicare Other | Admitting: Rehabilitative and Restorative Service Providers"

## 2011-03-03 ENCOUNTER — Ambulatory Visit: Payer: Medicare Other | Admitting: Physical Medicine & Rehabilitation

## 2011-03-03 ENCOUNTER — Encounter: Payer: Medicare Other | Attending: Physical Medicine & Rehabilitation

## 2011-03-03 DIAGNOSIS — M79609 Pain in unspecified limb: Secondary | ICD-10-CM | POA: Insufficient documentation

## 2011-03-03 DIAGNOSIS — IMO0002 Reserved for concepts with insufficient information to code with codable children: Secondary | ICD-10-CM | POA: Insufficient documentation

## 2011-03-03 DIAGNOSIS — M1710 Unilateral primary osteoarthritis, unspecified knee: Secondary | ICD-10-CM

## 2011-03-03 DIAGNOSIS — M48061 Spinal stenosis, lumbar region without neurogenic claudication: Secondary | ICD-10-CM | POA: Insufficient documentation

## 2011-03-13 ENCOUNTER — Encounter: Payer: Self-pay | Admitting: Cardiovascular Disease

## 2011-03-14 ENCOUNTER — Ambulatory Visit: Payer: Medicare Other | Attending: Physical Medicine & Rehabilitation | Admitting: Physical Therapy

## 2011-03-14 DIAGNOSIS — M25569 Pain in unspecified knee: Secondary | ICD-10-CM | POA: Insufficient documentation

## 2011-03-14 DIAGNOSIS — M6281 Muscle weakness (generalized): Secondary | ICD-10-CM | POA: Insufficient documentation

## 2011-03-14 DIAGNOSIS — M25669 Stiffness of unspecified knee, not elsewhere classified: Secondary | ICD-10-CM | POA: Insufficient documentation

## 2011-03-14 DIAGNOSIS — IMO0001 Reserved for inherently not codable concepts without codable children: Secondary | ICD-10-CM | POA: Insufficient documentation

## 2011-03-16 ENCOUNTER — Encounter: Payer: Medicare Other | Admitting: Physical Therapy

## 2011-03-21 ENCOUNTER — Ambulatory Visit: Payer: Medicare Other | Admitting: Rehabilitative and Restorative Service Providers"

## 2011-03-23 ENCOUNTER — Ambulatory Visit: Payer: Medicare Other | Admitting: Rehabilitative and Restorative Service Providers"

## 2011-03-29 ENCOUNTER — Ambulatory Visit
Admission: RE | Admit: 2011-03-29 | Discharge: 2011-03-29 | Disposition: A | Discharge status: Home / Self Care | Payer: Medicare Other | Source: Ambulatory Visit | Attending: Internal Medicine | Admitting: Internal Medicine

## 2011-03-29 ENCOUNTER — Other Ambulatory Visit: Payer: Self-pay | Admitting: Internal Medicine

## 2011-03-29 DIAGNOSIS — M79604 Pain in right leg: Secondary | ICD-10-CM

## 2011-03-30 ENCOUNTER — Ambulatory Visit (INDEPENDENT_AMBULATORY_CARE_PROVIDER_SITE_OTHER): Payer: Medicare Other | Admitting: Cardiovascular Disease

## 2011-03-30 ENCOUNTER — Encounter: Payer: Self-pay | Admitting: Cardiovascular Disease

## 2011-03-30 VITALS — BP 140/82 | HR 71 | Resp 16 | Ht 64.0 in | Wt 174.0 lb

## 2011-03-30 DIAGNOSIS — I10 Essential (primary) hypertension: Secondary | ICD-10-CM

## 2011-03-30 DIAGNOSIS — E785 Hyperlipidemia, unspecified: Secondary | ICD-10-CM

## 2011-03-30 DIAGNOSIS — I2510 Atherosclerotic heart disease of native coronary artery without angina pectoris: Secondary | ICD-10-CM

## 2011-03-30 MED ORDER — ATORVASTATIN CALCIUM 20 MG PO TABS: 20.0000 mg | ORAL_TABLET | Freq: Every day | ORAL | Status: DC

## 2011-03-30 MED ORDER — AMLODIPINE BESYLATE 5 MG PO TABS: 5.0000 mg | ORAL_TABLET | Freq: Every day | ORAL | Status: DC

## 2011-03-30 NOTE — Assessment & Plan Note (Signed)
Stable. Will start Lipitor 20 mg po QHS. Continue beta blocker, ASA.

## 2011-03-30 NOTE — Progress Notes (Signed)
History of Present Illness:60 yo AAF with h/o CAD s/p MI and stents x 3 in 2004, HTN, Hyperlipidemia, DM type II who presents today for a routine visit. Echo in March 2010 showed normal LV size and function with EF of 60% and mild aortic valve calcification. Her stress myoview showed no ischemia at that time.  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   She has been doing well. She is active and describes no chest pain, SOB, palpitations.  Echo showed normal LV size and function without any significant valvular problems. Her Zocor was stopped because of leg cramps but leg cramps haven't changed so she wishes to restart Zocor. Her last lipid profile this spring showed total cholesterol of 190 and LDL of 103. Her BP has been elevated at home. Many readings in the 150-170 systolically. She had a doppler yesterday at primary care that showed 3.4 cm AAA and apparently normal LE dopplers.  Past Medical History  Diagnosis Date  . S/P carpal tunnel release   . Vertigo   . Tobacco abuse   . Encounter for long-term (current) use of other medications   . Osteoporosis   . Left acoustic neuroma     Hx of  . Diabetic peripheral neuropathy   . Mixed hyperlipidemia   . Primary osteoarthritis of both knees   . Chronic pain syndrome   . Obesity   . Hypertension   . CAD (coronary artery disease)   . Diabetes mellitus type II, controlled   . Bronchitis, acute   . Diabetes type 2, controlled     Past Surgical History  Procedure Date  . Resection of left acoustic neuroma 1999  . Coronary angioplasty with stent placement 2004    Normal LV function  . Total abdominal hysterectomy w/ bilateral salpingoophorectomy 04/12/1988    For fibroid tumors; [place], [place]  . Carpal tunnel release 08/09/1998    Right  . Carpal tunnel release 09/08/1998    Left  . Removal lipoma     Right foot  . Cesarean section     x3    Current Outpatient Prescriptions    Medication Sig Dispense Refill  . alendronate (FOSAMAX) 70 MG tablet Take 70 mg by mouth every 7 (seven) days. Take with a full glass of water on an empty stomach.       Marland Kitchen aspirin 81 MG EC tablet Take 81 mg by mouth daily.        . Cholecalciferol (VITAMIN D3) 2000 UNITS TABS Take 1 tablet by mouth daily.        . citalopram (CELEXA) 40 MG tablet Take 40 mg by mouth daily. **Needs office visit.**       . fish oil-omega-3 fatty acids 1000 MG capsule Take 1 g by mouth daily.        . metFORMIN (GLUCOPHAGE) 1000 MG tablet Take 1,000 mg by mouth 2 (two) times daily with a meal.        . metoprolol (TOPROL-XL) 50 MG 24 hr tablet Take 50 mg by mouth 2 (two) times daily.        . nitroGLYCERIN (NITROLINGUAL) 0.4 MG/SPRAY spray Place 1 spray under the tongue every 5 (five) minutes as needed. May repeat up to 3 doses.       Marland Kitchen omeprazole (PRILOSEC) 40 MG capsule Take 40 mg by mouth daily.        . valsartan-hydrochlorothiazide (DIOVAN-HCT) 160-12.5 MG per tablet Take 1 tablet by mouth  2 (two) times daily.        Marland Kitchen DISCONTD: pregabalin (LYRICA) 50 MG capsule Take 50 mg by mouth 3 (three) times daily.        Marland Kitchen DISCONTD: simvastatin (ZOCOR) 40 MG tablet Take 40 mg by mouth at bedtime.          No Known Allergies  History   Social History  . Marital Status: Divorced    Spouse Name: N/A    Number of Children: N/A  . Years of Education: N/A   Occupational History  . 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 Smoking status: Current Everyday Smoker -- 1.0 packs/day for 40 years    Types: Cigarettes  . Smokeless tobacco: Not on file  . Alcohol Use: Not on file  . Drug Use: Not on file  . Sexually Active: Not on file   Other Topics Concern  . Not on file   Social History Narrative   DivorcedDisability mainly for back and chronic intermittent vertigo - since tumor removed - no surgical correction for backLives alone, keeps her grandson, 4 yr old  daughter is a mother    Family History  Problem Relation Age of Onset  . Leukemia Mother     CLL  . Diabetes type II Mother   . Other Mother     Hypercholesterolemia  . Osteoarthritis Mother   . COPD Mother   . Hypertension Mother   . Hodgkin's lymphoma Father   . Diabetes type II Sister   . Osteoporosis Sister   . Paranoid behavior Brother     Unsure if diagnosed with Schizophrenia  . Diabetes type II Sister   . Hypertension Sister   . Diabetes type II Sister   . Hypertension Sister   . Schizophrenia Sister   . Heart disease Sister     Enlarged heart  . Glaucoma Sister   . Alcohol abuse Sister   . Glaucoma Sister   . Alcohol abuse Sister   . Bipolar disorder Daughter   . Lupus Daughter     SLE    Review of Systems:  As stated in the HPI and otherwise negative.   BP 140/82  Pulse 71  Resp 16  Ht 5\' 4"  (1.626 m)  Wt 174 lb (78.926 kg)  BMI 29.87 kg/m2  Physical Examination: General: Well developed, well nourished, NAD HEENT: OP clear, mucus membranes moist SKIN: warm, dry. No rashes. Neuro: No focal deficits Musculoskeletal: Muscle strength 5/5 all ext Psychiatric: Mood and affect normal Neck: No JVD, no carotid bruits, no thyromegaly, no lymphadenopathy. Lungs:Clear bilaterally, no wheezes, rhonci, crackles Cardiovascular: Regular rate and rhythm. No murmurs, gallops or rubs. Abdomen:Soft. Bowel sounds present. Non-tender.  Extremities: No lower extremity edema. Pulses are 2 + in the bilateral DP/PT.  EKG:NSR, rate 71 bpm. Poor R wave progression.

## 2011-03-30 NOTE — Patient Instructions (Signed)
Your physician recommends that you schedule a follow-up appointment in: 6 months  

## 2011-03-30 NOTE — Assessment & Plan Note (Signed)
Elevated. Will add Norvasc 5 mg po Qdaily.

## 2011-04-03 ENCOUNTER — Encounter: Payer: Medicare Other | Admitting: Physical Medicine & Rehabilitation

## 2011-04-03 ENCOUNTER — Encounter: Payer: Medicare Other | Attending: Physical Medicine & Rehabilitation

## 2011-04-03 DIAGNOSIS — M79609 Pain in unspecified limb: Secondary | ICD-10-CM | POA: Insufficient documentation

## 2011-04-03 DIAGNOSIS — IMO0002 Reserved for concepts with insufficient information to code with codable children: Secondary | ICD-10-CM | POA: Insufficient documentation

## 2011-04-03 DIAGNOSIS — M48061 Spinal stenosis, lumbar region without neurogenic claudication: Secondary | ICD-10-CM | POA: Insufficient documentation

## 2011-04-06 ENCOUNTER — Encounter: Payer: Medicare Other | Admitting: Physical Medicine & Rehabilitation

## 2011-04-06 ENCOUNTER — Ambulatory Visit: Payer: Medicare Other

## 2011-04-06 ENCOUNTER — Encounter: Payer: Medicare Other | Attending: Physical Medicine & Rehabilitation

## 2011-04-06 DIAGNOSIS — M1710 Unilateral primary osteoarthritis, unspecified knee: Secondary | ICD-10-CM

## 2011-04-06 DIAGNOSIS — M79609 Pain in unspecified limb: Secondary | ICD-10-CM

## 2011-04-06 DIAGNOSIS — M545 Low back pain, unspecified: Secondary | ICD-10-CM | POA: Insufficient documentation

## 2011-04-06 DIAGNOSIS — M7650 Patellar tendinitis, unspecified knee: Secondary | ICD-10-CM

## 2011-04-06 DIAGNOSIS — M48061 Spinal stenosis, lumbar region without neurogenic claudication: Secondary | ICD-10-CM | POA: Insufficient documentation

## 2011-04-06 DIAGNOSIS — IMO0002 Reserved for concepts with insufficient information to code with codable children: Secondary | ICD-10-CM

## 2011-04-07 NOTE — Procedures (Signed)
Theresa Cline, Theresa Cline            ACCOUNT NO.:  [id number]  MEDICAL RECORD NO.:  [id number]           PATIENT TYPE:  LOCATION:                                 FACILITY:  PHYSICIAN:  Erick Colace, M.D.DATE OF BIRTH:  22-Oct-1950  DATE OF PROCEDURE: DATE OF DISCHARGE:                              OPERATIVE REPORT  REASON FOR VISIT:  Left L4-5 transforaminal lumbar epidural steroid injection under fluoroscopic guidance.  INDICATIONS:  Lumbar pain with radiculitis, history of spinal stenosis, has had good relief with prior lumbosacral epidural steroid injection. Pain is only partially responsive to medication management and other conservative care and interferes with activities, rating scale of 8/10.  Informed consent was obtained after describing risks and benefits of the procedure with the patient.  These include bleeding, bruising and infection.  She elects to proceed and has given written consent.  The patient placed prone on fluoroscopy table.  Betadine prep, sterile drape, 25-gauge inch and half needle was used to anesthetize the skin and subcutaneous tissue 1% lidocaine x2 mL.  Then, a 22-gauge, 3-1/2- inch spinal needle was inserted under fluoroscopic guidance and left L4- 5 intervertebral foramen.  AP, lateral and oblique images utilized. Omnipaque 180 under live fluoro demonstrated good epidural as well as nerve root spread followed by injection of 1 mL of 10 mg/mL dexamethasone, 2 mL of 1% MPF lidocaine.  The patient tolerated the procedure well.  Postprocedure instructions given.  I will see her back in 1 month.  We planned to do right knee ultrasound-guided injection of her knee OA.     Erick Colace, M.D. Electronically Signed    AEK/MEDQ  D:  04/06/2011 45:40:98  T:  04/06/2011 11:91:47  Job:  [id number]

## 2011-05-22 ENCOUNTER — Encounter: Payer: Medicare Other | Admitting: Physical Medicine & Rehabilitation

## 2011-05-22 ENCOUNTER — Encounter: Payer: Medicare Other | Attending: Physical Medicine & Rehabilitation

## 2011-05-22 DIAGNOSIS — IMO0002 Reserved for concepts with insufficient information to code with codable children: Secondary | ICD-10-CM | POA: Insufficient documentation

## 2011-05-22 DIAGNOSIS — M79609 Pain in unspecified limb: Secondary | ICD-10-CM | POA: Insufficient documentation

## 2011-05-22 DIAGNOSIS — M48061 Spinal stenosis, lumbar region without neurogenic claudication: Secondary | ICD-10-CM | POA: Insufficient documentation

## 2011-05-22 DIAGNOSIS — M1710 Unilateral primary osteoarthritis, unspecified knee: Secondary | ICD-10-CM

## 2011-05-23 NOTE — Procedures (Signed)
NAMEJORDON, BOURQUIN            ACCOUNT NO.:  [id number]  MEDICAL RECORD NO.:  [id number]           PATIENT TYPE:  LOCATION:                                 FACILITY:  PHYSICIAN:  Erick Colace, M.D.DATE OF BIRTH:  May 22, 1951  DATE OF PROCEDURE: DATE OF DISCHARGE:                              OPERATIVE REPORT  This is a right knee ultrasound-guided knee injection.  INDICATION:  Right knee osteoarthritis pain only partially response to medication management and other conservative care interfering with activities.  Informed consent was obtained after describing risks and benefits of the procedure with the patient.  These include bleeding, bruising and infection.  She elects to proceed and has given written consent.  The patient was placed in a reclined position.  Area pre-scanned with ultrasound scanner at 12 Hz.  Identified suprapatellar bursa at 3 cm. Area was marked and prepped with Betadine, and the skin and subcu tissues were infiltrated with 1 mL of 1% lidocaine using a 25-gauge inch and a half needle.  Then, a 50-mm echo block needle 22-gauge was inserted under ultrasound guidance targeting the suprapatellar bursa, needle was visualized under direct ultrasound guidance, image stored followed by injection of 1 mL of 40 mg/mL Depo-Medrol 4 mL of 1% lidocaine.  The patient tolerated procedure well.  Postprocedure instructions given.  I will see her back in 1 month.  She is starting to experience the left lower extremity discomfort 2 months post epidural L4-5, will repeat.     Erick Colace, M.D. Electronically Signed    AEK/MEDQ  D:  05/22/2011 10:00:06  T:  05/22/2011 12:44:42  Job:  [id number]

## 2011-05-30 ENCOUNTER — Other Ambulatory Visit: Payer: Self-pay | Admitting: Internal Medicine

## 2011-05-30 DIAGNOSIS — R55 Syncope and collapse: Secondary | ICD-10-CM

## 2011-06-05 ENCOUNTER — Other Ambulatory Visit: Payer: Medicare Other

## 2011-06-06 ENCOUNTER — Ambulatory Visit: Payer: Medicare Other | Admitting: Cardiology

## 2011-06-06 ENCOUNTER — Other Ambulatory Visit: Payer: Medicare Other

## 2011-06-06 ENCOUNTER — Ambulatory Visit
Admission: RE | Admit: 2011-06-06 | Discharge: 2011-06-06 | Disposition: A | Discharge status: Home / Self Care | Payer: Medicare Other | Source: Ambulatory Visit | Attending: Internal Medicine | Admitting: Internal Medicine

## 2011-06-06 DIAGNOSIS — R55 Syncope and collapse: Secondary | ICD-10-CM

## 2011-06-22 ENCOUNTER — Encounter: Payer: Medicare Other | Attending: Physical Medicine & Rehabilitation

## 2011-06-22 ENCOUNTER — Encounter: Payer: Medicare Other | Admitting: Physical Medicine & Rehabilitation

## 2011-06-22 DIAGNOSIS — M48061 Spinal stenosis, lumbar region without neurogenic claudication: Secondary | ICD-10-CM | POA: Insufficient documentation

## 2011-06-22 DIAGNOSIS — M79609 Pain in unspecified limb: Secondary | ICD-10-CM | POA: Insufficient documentation

## 2011-06-22 DIAGNOSIS — M47817 Spondylosis without myelopathy or radiculopathy, lumbosacral region: Secondary | ICD-10-CM

## 2011-06-22 DIAGNOSIS — IMO0002 Reserved for concepts with insufficient information to code with codable children: Secondary | ICD-10-CM | POA: Insufficient documentation

## 2011-06-22 NOTE — Procedures (Signed)
NAMELASHANN, Theresa Cline            ACCOUNT NO.:  [id number]  MEDICAL RECORD NO.:  [id number]           PATIENT TYPE:  O  LOCATION:  [facility]                          FACILITY:  [facility]  PHYSICIAN:  Erick Colace, M.D.DATE OF BIRTH:  Dec 08, 1950  DATE OF PROCEDURE: DATE OF DISCHARGE:                              OPERATIVE REPORT  PROCEDURE:  Left L5 dorsal ramus injection at left L4 and left L3 medial branch blocks under fluoroscopic guidance.  INDICATIONS:  Lumbar spondylosis, low back pain with some radiation to the proximal left lower extremity.  Pain was not responsive to epidural injection.  She is 8 months post radiofrequency on the left side.  Informed consent was obtained after describing the risks and benefits of the procedure with the patient, these include bleeding, bruising, and infection.  She elects to proceed and has given written consent. Because it is unclear whether her pain is more radicular versus facet mediated, repeating medial branch blocks and if positive, the plan is to repeat RF on the left side.  Time-out taken for proper patient and procedure.  The patient was placed on prone position, area marked and prepped with Betadine alcohol, entered with 25-gauge inch and half needle, 1 mL of 1% lidocaine injected into each of three sites followed by its insertion of 22-gauge 5-inch spinal needle, inserted to bone contact first starting the left S1 SAP sacral junction, bone contact made confirmed with 0.5 milliliters of Omnipaque 180 followed by injection of 0.5 mL of 1 mL of 4 mg/mL dexamethasone and 2 mL of 2% lidocaine.  Then, the left L5 SAP transverse process junction targeted bone contact made.  Omnipaque 180 x0.5 mL demonstrated no intravascular uptake, then 0.5 mL of dexamethasone lidocaine solution was injected, then left L4 SAP transverse process junction targeted bone contact made.  Omnipaque 180 x0.5 mL demonstrated no intravascular uptake and 0.5 mL of  the dexamethasone lidocaine solution was injected.  The patient tolerated the procedure well.  Pre and postinjection vitals stable.  Postinjection instructions given.  Preinjection pain level 8/10, postinjection 0/10. We will repeat RF in 3 weeks given that this will be a temporary effect.     Erick Colace, M.D. Electronically Signed    AEK/MEDQ  D:  06/22/2011 09:57:10  T:  06/22/2011 10:27:26  Job:  [id number]

## 2011-06-27 ENCOUNTER — Ambulatory Visit: Payer: Medicare Other | Admitting: Cardiovascular Disease

## 2011-07-01 ENCOUNTER — Emergency Department (HOSPITAL_COMMUNITY)
Admission: EM | Admit: 2011-07-01 | Discharge: 2011-07-02 | Disposition: A | Discharge status: Home / Self Care | Payer: Medicare Other | Attending: Emergency Medicine | Admitting: Emergency Medicine

## 2011-07-01 DIAGNOSIS — Z79899 Other long term (current) drug therapy: Secondary | ICD-10-CM | POA: Insufficient documentation

## 2011-07-01 DIAGNOSIS — R609 Edema, unspecified: Secondary | ICD-10-CM | POA: Insufficient documentation

## 2011-07-01 DIAGNOSIS — I10 Essential (primary) hypertension: Secondary | ICD-10-CM | POA: Insufficient documentation

## 2011-07-01 DIAGNOSIS — F17200 Nicotine dependence, unspecified, uncomplicated: Secondary | ICD-10-CM | POA: Insufficient documentation

## 2011-07-01 DIAGNOSIS — E119 Type 2 diabetes mellitus without complications: Secondary | ICD-10-CM | POA: Insufficient documentation

## 2011-07-01 DIAGNOSIS — M7989 Other specified soft tissue disorders: Secondary | ICD-10-CM | POA: Insufficient documentation

## 2011-07-02 LAB — URINE MICROSCOPIC-ADD ON

## 2011-07-02 LAB — DIFFERENTIAL
Basophils Absolute: 0 10*3/uL (ref 0.0–0.1)
Basophils Relative: 0 % (ref 0–1)
Eosinophils Relative: 3 % (ref 0–5)
Monocytes Absolute: 0.7 10*3/uL (ref 0.1–1.0)
Neutro Abs: 2.8 10*3/uL (ref 1.7–7.7)

## 2011-07-02 LAB — BASIC METABOLIC PANEL
GFR calc Af Amer: >60 mL/min (ref >60)
GFR calc non Af Amer: 52 mL/min — ABNORMAL LOW (ref >60)
Potassium: 3.6 mEq/L (ref 3.5–5.1)
Sodium: 137 mEq/L (ref 135–145)

## 2011-07-02 LAB — URINALYSIS, ROUTINE W REFLEX MICROSCOPIC
Nitrite: NEGATIVE
Specific Gravity, Urine: 1.009 (ref 1.005–1.030)
Urobilinogen, UA: 0.2 mg/dL (ref 0.0–1.0)

## 2011-07-02 LAB — CBC
Hemoglobin: 11.5 g/dL — ABNORMAL LOW (ref 12.0–15.0)
MCHC: 32.2 g/dL (ref 30.0–36.0)
Platelets: 214 10*3/uL (ref 150–400)
RDW: 13.1 % (ref 11.5–15.5)

## 2011-07-03 LAB — URINE CULTURE: Culture  Setup Time: 201209231126

## 2011-07-13 ENCOUNTER — Ambulatory Visit (HOSPITAL_COMMUNITY)
Admission: RE | Admit: 2011-07-13 | Discharge: 2011-07-13 | Disposition: A | Discharge status: Home / Self Care | Payer: Medicare Other | Source: Ambulatory Visit | Attending: Physical Medicine & Rehabilitation | Admitting: Physical Medicine & Rehabilitation

## 2011-07-13 ENCOUNTER — Other Ambulatory Visit: Payer: Self-pay | Admitting: Physical Medicine & Rehabilitation

## 2011-07-13 ENCOUNTER — Encounter: Payer: Medicare Other | Admitting: Physical Medicine & Rehabilitation

## 2011-07-13 ENCOUNTER — Encounter: Payer: Medicare Other | Attending: Physical Medicine & Rehabilitation

## 2011-07-13 DIAGNOSIS — M79606 Pain in leg, unspecified: Secondary | ICD-10-CM

## 2011-07-13 DIAGNOSIS — M79609 Pain in unspecified limb: Secondary | ICD-10-CM | POA: Insufficient documentation

## 2011-07-13 DIAGNOSIS — IMO0002 Reserved for concepts with insufficient information to code with codable children: Secondary | ICD-10-CM | POA: Insufficient documentation

## 2011-07-13 DIAGNOSIS — M48061 Spinal stenosis, lumbar region without neurogenic claudication: Secondary | ICD-10-CM | POA: Insufficient documentation

## 2011-07-13 DIAGNOSIS — M47817 Spondylosis without myelopathy or radiculopathy, lumbosacral region: Secondary | ICD-10-CM

## 2011-07-13 NOTE — Procedures (Signed)
Theresa Cline, Theresa Cline            ACCOUNT NO.:  [id number]  MEDICAL RECORD NO.:  [id number]           PATIENT TYPE:  O  LOCATION:  [facility]                          FACILITY:  [facility]  PHYSICIAN:  Erick Colace, M.D.DATE OF BIRTH:  11-12-1950  DATE OF PROCEDURE:  07/13/2011 DATE OF DISCHARGE:                              OPERATIVE REPORT  PROCEDURE:  This is a left L5 dorsal ramus, left L4 and left L3 medial branch radiofrequency neurotomy under fluoroscopic guidance.  INDICATION:  Lumbar spondylosis without myelopathy.  Pain is only partially responsive to medication management and other conservative care.  Interfering with activities.  Informed consent was obtained after describing risks and benefits of the procedure with the patient.  Her Oswestry score today is 60.  She elects to proceed and has given written consent.  The patient placed prone on fluoroscopy table.  Betadine prep, sterile drape, 25-gauge inch and half needle was used to anesthetize the skin and subcutaneous tissue, 1% lidocaine x2 mL.  Then, a 20 gauge 10- cm RF needle with 10-mm curved active tip was inserted under fluoroscopic guidance starting the left S1 SAP sacral junction, bone contact made, confirmed with lateral imaging.  Sensory stem at 50 Hz followed by motor stem at 2 Hz confirmed proper needle location followed by injection 1 mL of solution containing 1 mL of 4 mg/mL dexamethasone and 2 mL of 1% MPF lidocaine.  A left L5 SAP transverse process junction targeted, bone contact made, confirmed with lateral imaging.  Sensory stem at 50 Hz followed by motor stem at 2 Hz confirmed proper needle location followed by injection 1 mL of dexamethasone-lidocaine solution. Then, left L5 SAP transverse process junction targeted, bone contact made, confirmed with lateral imaging.  Sensory stem at 50 Hz followed motor stem at 2 Hz confirmed proper needle location.  Then, radiofrequency lesioning was performed 70  degrees Celsius x90 seconds. The patient tolerated the procedure well.  Postprocedure instructions given.  The patient returned in 1 month for followup visit.  ADDENDUM:  She gives a history of her grandson accidently kicking her left shin, we will check an x-ray given that she does have swelling and pain over that area.     Erick Colace, M.D. Electronically Signed    AEK/MEDQ  D:  07/13/2011 10:26:36  T:  07/13/2011 11:00:59  Job:  [id number]

## 2011-07-17 ENCOUNTER — Encounter: Payer: Self-pay | Admitting: Cardiovascular Disease

## 2011-07-17 ENCOUNTER — Ambulatory Visit (INDEPENDENT_AMBULATORY_CARE_PROVIDER_SITE_OTHER): Payer: Medicare Other | Admitting: Cardiovascular Disease

## 2011-07-17 ENCOUNTER — Other Ambulatory Visit: Payer: Self-pay | Admitting: Internal Medicine

## 2011-07-17 VITALS — BP 110/80 | HR 66 | Resp 18 | Ht 63.0 in | Wt 173.8 lb

## 2011-07-17 DIAGNOSIS — R609 Edema, unspecified: Secondary | ICD-10-CM

## 2011-07-17 DIAGNOSIS — I2510 Atherosclerotic heart disease of native coronary artery without angina pectoris: Secondary | ICD-10-CM

## 2011-07-17 DIAGNOSIS — R55 Syncope and collapse: Secondary | ICD-10-CM

## 2011-07-17 NOTE — Assessment & Plan Note (Signed)
Stable. Continue current therapy. No changes. Lipids are followed in primary care.

## 2011-07-17 NOTE — Assessment & Plan Note (Signed)
She is known to have moderate carotid artery stenosis but recent carotid artery dopplers in primary care unchanged per pt. I think her syncopal event is most likely vasovagal following the bowel movement. No evidence that this was cardiac. Her EKG today is normal. NO chest pain or change in energy level. NO further cardiac workup.

## 2011-07-17 NOTE — Progress Notes (Signed)
History of Present Illness:60 yo AAF with h/o CAD s/p MI and stents x 3 in 2004, HTN, Hyperlipidemia, DM type II who presents today for a routine visit.  Her stress myoview in 2010 showed no ischemia. 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 Echo October 2010 showed normal LV size and function without any significant valvular problems. Her last lipid profile June 2012  showed total cholesterol of 186 and LDL of 103, HDL 56.  I last saw her in June 2012. 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   Since her last visit in our office, she had a syncopal event. She tells me that she was sitting on the toilet and when she stood up, she passed out. This was following a bowel movement. She felt lightheaded when standing and passed out. No recurrences over the last two months. No chest pain. Her breathing has been ok. Her carotid arteries were looked at in primary care and dopplers per patient were unchanged. She was seen in the ED at [facility] 07/02/11 for LE edema. She was given Lasix for 10 days and this has resolved. She says her swelling has gotten better. She is elevating her legs when sitting and watching salt intake. Overall feels well.   Past Medical History  Diagnosis Date  . S/P carpal tunnel release   . Vertigo   . Tobacco abuse   . Encounter for long-term (current) use of other medications   . Osteoporosis   . Left acoustic neuroma     Hx of  . Diabetic peripheral neuropathy   . Mixed hyperlipidemia   . Primary osteoarthritis of both knees   . Chronic pain syndrome   . Obesity   . Hypertension   . CAD (coronary artery disease)   . Diabetes mellitus type II, controlled   . Bronchitis, acute   . Diabetes type 2, controlled     Past Surgical History  Procedure Date  . Resection of left acoustic neuroma 1999  . Coronary angioplasty with stent placement 2004    Normal LV function    . Total abdominal hysterectomy w/ bilateral salpingoophorectomy 04/12/1988    For fibroid tumors; [place], [place]  . Carpal tunnel release 08/09/1998    Right  . Carpal tunnel release 09/08/1998    Left  . Removal lipoma     Right foot  . Cesarean section     x3    Current Outpatient Prescriptions  Medication Sig Dispense Refill  . alendronate (FOSAMAX) 70 MG tablet Take 70 mg by mouth every 7 (seven) days. Take with a full glass of water on an empty stomach.       Marland Kitchen aspirin 81 MG EC tablet Take 81 mg by mouth daily.        . Cholecalciferol (VITAMIN D3) 2000 UNITS TABS Take 1 tablet by mouth daily.        . citalopram (CELEXA) 40 MG tablet Take 40 mg by mouth daily. **Needs office visit.**       . fish oil-omega-3 fatty acids 1000 MG capsule Take 1 g by mouth daily.        . metFORMIN (GLUCOPHAGE) 1000 MG tablet Take 1,000 mg by mouth 2 (two) times daily with a meal.        . metoprolol (TOPROL-XL) 100 MG 24 hr tablet Take 100 mg by mouth daily.        Marland Kitchen  nitroGLYCERIN (NITROLINGUAL) 0.4 MG/SPRAY spray Place 1 spray under the tongue every 5 (five) minutes as needed. May repeat up to 3 doses.       Marland Kitchen omeprazole (PRILOSEC) 40 MG capsule Take 40 mg by mouth daily.        . traMADol-acetaminophen (ULTRACET) 37.5-325 MG per tablet Take 1 tablet by mouth every 6 (six) hours as needed.        . valsartan-hydrochlorothiazide (DIOVAN-HCT) 160-12.5 MG per tablet Take 1 tablet by mouth 2 (two) times daily.          No Known Allergies  History   Social History  . Marital Status: Divorced    Spouse Name: N/A    Number of Children: N/A  . Years of Education: N/A   Occupational History  . 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 Smoking status: Current Everyday Smoker -- 1.0 packs/day for 40 years    Types: Cigarettes  . Smokeless tobacco: Not on file  . Alcohol Use: Not on file  . Drug Use: Not on file  . Sexually Active: Not on file    Other Topics Concern  . Not on file   Social History Narrative   DivorcedDisability mainly for back and chronic intermittent vertigo - since tumor removed - no surgical correction for backLives alone, keeps her grandson, 87 yr old daughter is a mother    Family History  Problem Relation Age of Onset  . Leukemia Mother     CLL  . Diabetes type II Mother   . Other Mother     Hypercholesterolemia  . Osteoarthritis Mother   . COPD Mother   . Hypertension Mother   . Hodgkin's lymphoma Father   . Diabetes type II Sister   . Osteoporosis Sister   . Paranoid behavior Brother     Unsure if diagnosed with Schizophrenia  . Diabetes type II Sister   . Hypertension Sister   . Diabetes type II Sister   . Hypertension Sister   . Schizophrenia Sister   . Heart disease Sister     Enlarged heart  . Glaucoma Sister   . Alcohol abuse Sister   . Glaucoma Sister   . Alcohol abuse Sister   . Bipolar disorder Daughter   . Lupus Daughter     SLE    Review of Systems:  As stated in the HPI and otherwise negative.   BP 110/80  Pulse 66  Resp 18  Ht 5\' 3"  (1.6 m)  Wt 173 lb 12.8 oz (78.835 kg)  BMI 30.79 kg/m2  Physical Examination: General: Well developed, well nourished, NAD HEENT: OP clear, mucus membranes moist SKIN: warm, dry. No rashes. Neuro: No focal deficits Musculoskeletal: Muscle strength 5/5 all ext Psychiatric: Mood and affect normal Neck: No JVD, no carotid bruits, no thyromegaly, no lymphadenopathy. Lungs:Clear bilaterally, no wheezes, rhonci, crackles Cardiovascular: Regular rate and rhythm. No murmurs, gallops or rubs. Abdomen:Soft. Bowel sounds present. Non-tender.  Extremities: No lower extremity edema. Pulses are 2 + in the bilateral DP/PT.  EKG:NSR, rate 66 bpm. Normal EKG

## 2011-07-17 NOTE — Patient Instructions (Signed)
Your physician wants you to follow-up in:  12 months.  You will receive a reminder letter in the mail two months in advance. If you don't receive a letter, please call our office to schedule the follow-up appointment.   

## 2011-07-21 ENCOUNTER — Other Ambulatory Visit: Payer: Self-pay | Admitting: Internal Medicine

## 2011-07-21 DIAGNOSIS — M79604 Pain in right leg: Secondary | ICD-10-CM

## 2011-07-21 DIAGNOSIS — M79605 Pain in left leg: Secondary | ICD-10-CM

## 2011-07-27 ENCOUNTER — Telehealth: Payer: Self-pay | Admitting: Cardiovascular Disease

## 2011-07-27 NOTE — Telephone Encounter (Signed)
Pt called and she had she woke up at 4am. She said she had pain in her stomach and was hurting under left breast. She said she felt her heart skip a beat last week She was concerned please call

## 2011-07-27 NOTE — Telephone Encounter (Signed)
Spoke with pt who reports she woke up this morning and had fleeting episode of pain in Left armpit area. Pain lasted only a few seconds and went away on own. No shortness of breath and no symptoms since then or prior to this episode. Also noted one skipped beat the night before last. No pain at that time. No skipped beats since this time.  She is seeing primary MD for swelling in lower legs.  I asked her to continue to monitor and let us know if she had pain again or increase in palpitations.

## 2011-07-28 ENCOUNTER — Other Ambulatory Visit: Payer: Medicare Other

## 2011-07-31 ENCOUNTER — Other Ambulatory Visit: Payer: Self-pay | Admitting: Internal Medicine

## 2011-07-31 DIAGNOSIS — M79604 Pain in right leg: Secondary | ICD-10-CM

## 2011-07-31 DIAGNOSIS — I872 Venous insufficiency (chronic) (peripheral): Secondary | ICD-10-CM

## 2011-08-01 ENCOUNTER — Ambulatory Visit
Admission: RE | Admit: 2011-08-01 | Discharge: 2011-08-01 | Disposition: A | Discharge status: Home / Self Care | Payer: Medicare Other | Source: Ambulatory Visit | Attending: Internal Medicine | Admitting: Internal Medicine

## 2011-08-01 DIAGNOSIS — I872 Venous insufficiency (chronic) (peripheral): Secondary | ICD-10-CM

## 2011-08-01 DIAGNOSIS — M79604 Pain in right leg: Secondary | ICD-10-CM

## 2011-08-02 ENCOUNTER — Other Ambulatory Visit: Payer: Medicare Other

## 2011-08-15 ENCOUNTER — Encounter: Payer: PRIVATE HEALTH INSURANCE | Attending: Physical Medicine & Rehabilitation

## 2011-08-15 ENCOUNTER — Ambulatory Visit: Payer: Medicare Other | Admitting: Physical Medicine & Rehabilitation

## 2011-08-15 DIAGNOSIS — M48061 Spinal stenosis, lumbar region without neurogenic claudication: Secondary | ICD-10-CM | POA: Insufficient documentation

## 2011-08-15 DIAGNOSIS — IMO0002 Reserved for concepts with insufficient information to code with codable children: Secondary | ICD-10-CM

## 2011-08-15 DIAGNOSIS — M79609 Pain in unspecified limb: Secondary | ICD-10-CM | POA: Insufficient documentation

## 2011-08-15 DIAGNOSIS — R209 Unspecified disturbances of skin sensation: Secondary | ICD-10-CM

## 2011-08-15 NOTE — Assessment & Plan Note (Signed)
HISTORY:  Theresa Cline returns today, she indicates that her radiofrequency neurotomy performed on July 13, 2011 was not helpful like her medial branch blocks.  She did get immediate release some pain afterwards but not longer term.  We also reviewed her x-ray of the left tib and fib which showed no acute bony changes.  She did have a hematoma over that area.  She has had no other new issues other than continued to have left lower extremity pain.  I reviewed her notes in the past we have done left L4-5 transforaminal nerve root blocks in April and then later in June.  Has not had 1 since June.  She has had complaints of left lower extremity discomfort continuing.  She also complains of jerking in her arms.  She has had no trauma.  She denies any neck pain.  She wonders whether it could be the tramadol, however, she has been on this for over a year without difficulties.  She has not had any new medicines per her report.  REVIEW OF SYSTEMS:  Positive for numbness, tremor, tingling in the left leg, primarily confusion, depression, anxiety, nausea.  Her other physicians include Dr. Nicholos Johns as well as Dr. Heloise Purpura.  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  She had a kidney tumor removed by Dr. Laverle Patter in 2011.  SOCIAL HISTORY:  Lives alone, divorced, 2-pack per week smoker.  PHYSICAL EXAMINATION:  VITAL SIGNS:  Her blood pressure 105/58, pulse 71, respirations 18, and O2 saturation 98% on room air. GENERAL:  No acute distress.  Mood and affect appropriate. MUSCULOSKELETAL:  Her back has no tenderness to palpation.  Her lumbar spine range of motion 50% forward flexion, extension, lateral rotation, and bending.  Straight leg raising test is negative.  She does have decreased DTR on the left.  Patellar normal on the right.  She has hyperactive reflexes in bilateral upper extremity and her neck range of motion is 75% range, forward flexion, extension, lateral  rotation, and bending.  Her neck has no tenderness to palpation on the cervical spine area.  IMPRESSION: 1. Lumbar spinal stenosis.  I have once again reviewed her MRI, she     has central stenosis at L4-5 which is severe , there is no real     foraminal stenosis at that level.  I think we can repeat her L4-5     transforaminal injection on the left side. 2. Arm jerking.  She has had neck pain for over 10 years.  She has     lumbar stenosis, I question whether she may be developing some     cervical stenosis, we will just start out with some x-rays of her     neck to see if she has some significant spondylosis 5 views. 3. In terms of her medications, we will keep her on the same,  I     really do not think the tramadol causing jerking the arm she has     been on this for over a year.     Erick Colace, M.D. Electronically Signed    AEK/MedQ D:  08/15/2011 11:07:45  T:  08/15/2011 11:47:25  Job #:  [id number]  cc:   Heloise Purpura, MD Fax: [phone]

## 2011-08-18 ENCOUNTER — Ambulatory Visit (HOSPITAL_COMMUNITY)
Admission: RE | Admit: 2011-08-18 | Discharge: 2011-08-18 | Disposition: A | Discharge status: Home / Self Care | Payer: PRIVATE HEALTH INSURANCE | Source: Ambulatory Visit | Attending: Urology | Admitting: Urology

## 2011-08-18 ENCOUNTER — Other Ambulatory Visit: Payer: Self-pay | Admitting: Physical Medicine & Rehabilitation

## 2011-08-18 ENCOUNTER — Ambulatory Visit (HOSPITAL_COMMUNITY)
Admission: RE | Admit: 2011-08-18 | Discharge: 2011-08-18 | Disposition: A | Discharge status: Home / Self Care | Payer: PRIVATE HEALTH INSURANCE | Source: Ambulatory Visit | Attending: Physical Medicine & Rehabilitation | Admitting: Physical Medicine & Rehabilitation

## 2011-08-18 ENCOUNTER — Other Ambulatory Visit (HOSPITAL_COMMUNITY): Payer: Self-pay | Admitting: Urology

## 2011-08-18 DIAGNOSIS — M47812 Spondylosis without myelopathy or radiculopathy, cervical region: Secondary | ICD-10-CM | POA: Insufficient documentation

## 2011-08-18 DIAGNOSIS — C649 Malignant neoplasm of unspecified kidney, except renal pelvis: Secondary | ICD-10-CM

## 2011-08-18 DIAGNOSIS — R52 Pain, unspecified: Secondary | ICD-10-CM

## 2011-08-18 DIAGNOSIS — I252 Old myocardial infarction: Secondary | ICD-10-CM | POA: Insufficient documentation

## 2011-08-18 DIAGNOSIS — F17200 Nicotine dependence, unspecified, uncomplicated: Secondary | ICD-10-CM | POA: Insufficient documentation

## 2011-08-18 DIAGNOSIS — I10 Essential (primary) hypertension: Secondary | ICD-10-CM | POA: Insufficient documentation

## 2011-08-18 DIAGNOSIS — R259 Unspecified abnormal involuntary movements: Secondary | ICD-10-CM | POA: Insufficient documentation

## 2011-09-14 ENCOUNTER — Encounter: Payer: PRIVATE HEALTH INSURANCE | Attending: Physical Medicine & Rehabilitation

## 2011-09-14 ENCOUNTER — Ambulatory Visit: Payer: Medicare Other | Admitting: Physical Medicine & Rehabilitation

## 2011-09-14 DIAGNOSIS — M48061 Spinal stenosis, lumbar region without neurogenic claudication: Secondary | ICD-10-CM | POA: Insufficient documentation

## 2011-09-14 DIAGNOSIS — IMO0002 Reserved for concepts with insufficient information to code with codable children: Secondary | ICD-10-CM | POA: Insufficient documentation

## 2011-09-14 DIAGNOSIS — M79609 Pain in unspecified limb: Secondary | ICD-10-CM | POA: Insufficient documentation

## 2011-09-14 NOTE — Assessment & Plan Note (Signed)
PROCEDURE:  Left L4-5 transforaminal lumbar epidural steroid injection under fluoroscopic guidance.  INDICATION:  Left L4 radicular pain.  Informed consent was obtained after describing risks and benefits of the procedure with the patient.  These include bleeding, bruising and infection.  She elects to proceed and has given written consent.  The patient placed prone on fluoroscopy table.  Betadine prep, sterile drape, proper patient and procedure confirmed, 1% lidocaine x2 mL injected under skin and subcu tissue, 25-gauge inch and half needle, followed by insertion of a 22-gauge, 3.5 inch spinal needle into the left L4-5 intervertebral foramen.  AP, lateral and oblique images utilized.  Omnipaque 180 under live fluoro demonstrated good nerve root and epidural spread x2 mL.  Then, a solution containing 2 mL of 1% lidocaine MPF plus 1 mL of 10 mg/mL dexamethasone were injected.  The patient tolerated the procedure well.  Postprocedure instructions given. I will see her back in 3 months.     Erick Colace, M.D. Electronically Signed    AEK/MedQ D:  09/14/2011 11:27:11  T:  09/14/2011 11:40:41  Job #:  [id number]

## 2011-10-21 ENCOUNTER — Other Ambulatory Visit: Payer: Self-pay | Admitting: Cardiovascular Disease

## 2011-10-23 ENCOUNTER — Other Ambulatory Visit: Payer: Self-pay

## 2011-10-23 MED ORDER — NITROGLYCERIN 0.4 MG/SPRAY TL SOLN: 1.0000 | Status: DC | PRN

## 2011-12-11 ENCOUNTER — Encounter: Payer: Self-pay | Admitting: Physical Medicine & Rehabilitation

## 2011-12-11 ENCOUNTER — Ambulatory Visit (HOSPITAL_BASED_OUTPATIENT_CLINIC_OR_DEPARTMENT_OTHER): Payer: PRIVATE HEALTH INSURANCE | Admitting: Physical Medicine & Rehabilitation

## 2011-12-11 ENCOUNTER — Encounter: Payer: PRIVATE HEALTH INSURANCE | Attending: Physical Medicine & Rehabilitation

## 2011-12-11 DIAGNOSIS — M7061 Trochanteric bursitis, right hip: Secondary | ICD-10-CM

## 2011-12-11 DIAGNOSIS — C801 Malignant (primary) neoplasm, unspecified: Secondary | ICD-10-CM | POA: Insufficient documentation

## 2011-12-11 DIAGNOSIS — M7062 Trochanteric bursitis, left hip: Secondary | ICD-10-CM | POA: Insufficient documentation

## 2011-12-11 DIAGNOSIS — M48061 Spinal stenosis, lumbar region without neurogenic claudication: Secondary | ICD-10-CM

## 2011-12-11 DIAGNOSIS — M79609 Pain in unspecified limb: Secondary | ICD-10-CM | POA: Insufficient documentation

## 2011-12-11 DIAGNOSIS — IMO0002 Reserved for concepts with insufficient information to code with codable children: Secondary | ICD-10-CM | POA: Insufficient documentation

## 2011-12-11 DIAGNOSIS — M76899 Other specified enthesopathies of unspecified lower limb, excluding foot: Secondary | ICD-10-CM

## 2011-12-11 NOTE — Progress Notes (Signed)
  PROCEDURE RECORD [facility]   Name: Theresa Cline DOB:1950-10-29 MRN: [id number]  Date:12/11/2011  Physician: Claudette Laws, MD    Nurse/CMA: Noralyn Pick, CMA  Allergies:  Allergies  Allergen Reactions  . Adhesive (Tape) Rash    Consent Signed: yes  Is patient diabetic? yes  CBG today? 82  Pregnant: no LMP: No LMP recorded. Patient has had a hysterectomy. (age [age])  Anticoagulants: no Anti-inflammatory: no Antibiotics: no  Procedure: Hip injection  Position: L side  RN/CMA Noralyn Pick, CMA     Time 9:39am     BP 124/76     Pulse 70     Respirations 14     O2 Sat 96     S/S 6     Pain Level 7-8/10      D/C home with Marylene Land, patient A & O X 3, D/C instructions reviewed, and sits independently.

## 2011-12-11 NOTE — Patient Instructions (Signed)
Joint Injection Care After Refer to this sheet in the next few days. These instructions provide you with information on caring for yourself after you have had a joint injection. Your caregiver also may give you more specific instructions. Your treatment has been planned according to current medical practices, but problems sometimes occur. Call your caregiver if you have any problems or questions after your procedure. After any type of joint injection, it is not uncommon to experience:  Soreness, swelling, or bruising around the injection site.   Mild numbness, tingling, or weakness around the injection site caused by the numbing medicine used before or with the injection.  It also is possible to experience the following effects associated with the specific agent after injection:  Iodine-based contrast agents:   Allergic reaction (itching, hives, widespread redness, and swelling beyond the injection site).   Corticosteroids (These effects are rare.):   Allergic reaction.   Increased blood sugar levels (If you have diabetes and you notice that your blood sugar levels have increased, notify your caregiver).   Increased blood pressure levels.   Mood swings.   Hyaluronic acid in the use of viscosupplementation.   Temporary heat or redness.   Temporary rash and itching.   Increased fluid accumulation in the injected joint.  These effects all should resolve within a day after your procedure.  HOME CARE INSTRUCTIONS  Limit yourself to light activity the day of your procedure. Avoid lifting heavy objects, bending, stooping, or twisting.   Take prescription or over-the-counter pain medication as directed by your caregiver.   You may apply ice to your injection site to reduce pain and swelling the day of your procedure. Ice may be applied 3 to 4 times:   Put ice in a plastic bag.   Place a towel between your skin and the bag.   Leave the ice on for no longer than 15 to 20 minutes  each time.  SEEK IMMEDIATE MEDICAL CARE IF:   Pain and swelling get worse rather than better or extend beyond the injection site.   Numbness does not go away.   Blood or fluid continues to leak from the injection site.   You have chest pain.   You have swelling of your face or tongue.   You have trouble breathing or you become dizzy.   You develop a fever, chills, or severe tenderness at the injection site that last longer than 1 day.  MAKE SURE YOU:  Understand these instructions.   Watch your condition.   Get help right away if you are not doing well or if you get worse.  Document Released: 06/08/2011 Document Revised: 09/14/2011 Document Reviewed: 06/08/2011 [employer] Patient Information 2012 [employer]. 

## 2011-12-11 NOTE — Progress Notes (Signed)
  Aspiration/Injection Procedure Note KAYRON KALMAR [id number] January 20, 1951  Procedure: Injection Indications: R hip troch bursitis  Procedure Details Consent: Risks of procedure as well as the alternatives and risks of each were explained to the (patient/caregiver).  Consent for procedure obtained. Time Out: Verified patient identification, verified procedure, site/side was marked, verified correct patient position, special equipment/implants available, medications/allergies/relevent history reviewed, required imaging and test results available.  Performed   Local Anesthesia Used:Lidocaine 1% plain; 4mL  1 ml 40mg /ml depomedrol Amount of Fluid Aspirated: na Character of Fluid: na Fluid was sent for:na A sterile dressing was applied.  Patient did tolerate procedure well. Estimated blood loss: nil  Addaleigh Nicholls E 12/11/2011, 10:17 AM

## 2012-01-02 ENCOUNTER — Other Ambulatory Visit (HOSPITAL_COMMUNITY): Payer: Self-pay | Admitting: Internal Medicine

## 2012-01-02 DIAGNOSIS — Z1231 Encounter for screening mammogram for malignant neoplasm of breast: Secondary | ICD-10-CM

## 2012-01-18 ENCOUNTER — Ambulatory Visit (HOSPITAL_BASED_OUTPATIENT_CLINIC_OR_DEPARTMENT_OTHER): Payer: PRIVATE HEALTH INSURANCE | Admitting: Physical Medicine & Rehabilitation

## 2012-01-18 ENCOUNTER — Encounter: Payer: PRIVATE HEALTH INSURANCE | Attending: Physical Medicine & Rehabilitation

## 2012-01-18 ENCOUNTER — Encounter: Payer: Self-pay | Admitting: Physical Medicine & Rehabilitation

## 2012-01-18 VITALS — BP 118/69 | HR 72 | Resp 16 | Ht 64.0 in | Wt 174.0 lb

## 2012-01-18 DIAGNOSIS — M1710 Unilateral primary osteoarthritis, unspecified knee: Secondary | ICD-10-CM

## 2012-01-18 DIAGNOSIS — M48061 Spinal stenosis, lumbar region without neurogenic claudication: Secondary | ICD-10-CM | POA: Insufficient documentation

## 2012-01-18 DIAGNOSIS — M79609 Pain in unspecified limb: Secondary | ICD-10-CM | POA: Insufficient documentation

## 2012-01-18 DIAGNOSIS — M1711 Unilateral primary osteoarthritis, right knee: Secondary | ICD-10-CM | POA: Insufficient documentation

## 2012-01-18 DIAGNOSIS — IMO0002 Reserved for concepts with insufficient information to code with codable children: Secondary | ICD-10-CM | POA: Insufficient documentation

## 2012-01-18 NOTE — Patient Instructions (Signed)
Knee Injection Joint injections are shots. Your caregiver will place a needle into your knee joint. The needle is used to put medicine into the joint. These shots can be used to help treat different painful knee conditions such as osteoarthritis, bursitis, local flare-ups of rheumatoid arthritis, and pseudogout. Anti-inflammatory medicines such as corticosteroids and anesthetics are the most common medicines used for joint and soft tissue injections.  PROCEDURE  The skin over the kneecap will be cleaned with an antiseptic solution.   Your caregiver will inject a small amount of a local anesthetic (a medicine like Novocaine) just under the skin in the area that was cleaned.   After the area becomes numb, a second injection is done. This second injection usually includes an anesthetic and an anti-inflammatory medicine called a steroid or cortisone. The needle is carefully placed in between the kneecap and the knee, and the medicine is injected into the joint space.   After the injection is done, the needle is removed. Your caregiver may place a bandage over the injection site. The whole procedure takes no more than a couple of minutes.  BEFORE THE PROCEDURE  Wash all of the skin around the entire knee area. Try to remove any loose, scaling skin. There is no other specific preparation necessary unless advised otherwise by your caregiver. LET YOUR CAREGIVER KNOW ABOUT:   Allergies.   Medications taken including herbs, eye drops, over the counter medications, and creams.   Use of steroids (by mouth or creams).   Possible pregnancy, if applicable.   Previous problems with anesthetics or Novocaine.   History of blood clots (thrombophlebitis).   History of bleeding or blood problems.   Previous surgery.   Other health problems.  RISKS AND COMPLICATIONS Side effects from cortisone shots are rare. They include:   Slight bruising of the skin.   Shrinkage of the normal fatty tissue under the  skin where the shot was given.   Increase in pain after the shot.   Infection.   Weakening of tendons or tendon rupture.   Allergic reaction to the medicine.   Diabetics may have a temporary increase in their blood sugar after a shot.   Cortisone can temporarily weaken the immune system. While receiving these shots, you should not get certain vaccines. Also, avoid contact with anyone who has chickenpox or measles. Especially if you have never had these diseases or have not been previously immunized. Your immune system may not be strong enough to fight off the infection while the cortisone is in your system.  AFTER THE PROCEDURE   You can go home after the procedure.   You may need to put ice on the joint 15 to 20 minutes every 3 or 4 hours until the pain goes away.   You may need to put an elastic bandage on the joint.  HOME CARE INSTRUCTIONS   Only take over-the-counter or prescription medicines for pain, discomfort, or fever as directed by your caregiver.   You should avoid stressing the joint. Unless advised otherwise, avoid activities that put a lot of pressure on a knee joint, such as:   Jogging.   Bicycling.   Recreational climbing.   Hiking.   Laying down and elevating the leg/knee above the level of your heart can help to minimize swelling.  SEEK MEDICAL CARE IF:   You have repeated or worsening swelling.   There is drainage from the puncture area.   You develop red streaking that extends above or below the   site where the needle was inserted.  SEEK IMMEDIATE MEDICAL CARE IF:   You develop a fever.   You have pain that gets worse even though you are taking pain medicine.   The area is red and warm, and you have trouble moving the joint.  MAKE SURE YOU:   Understand these instructions.   Will watch your condition.   Will get help right away if you are not doing well or get worse.  Document Released: 12/17/2006 Document Revised: 09/14/2011 Document  Reviewed: 09/13/2007 [employer] Patient Information 2012 [employer]. 

## 2012-01-18 NOTE — Progress Notes (Signed)
Knee injection With ultrasound guidance  Indication:R Knee pain not relieved by medication management and other conservative care.  Informed consent was obtained after describing risks and benefits of the procedure with the patient, this includes bleeding, bruising, infection and medication side effects. The patient wishes to proceed and has given written consent. The patient was placed in a recumbent position. The medial aspect of the knee was marked and prepped with Betadine and alcohol. It was then entered with a 25-gauge 1-1/2 inch needle and 1 mL of 1% lidocaine was injected into the skin and subcutaneous tissue. Then another 40mm ECHO block needle was inserted into the knee joint. After negative draw back for blood, a solution containing one ML of 40 mg per mL depomedrol and 3 mL of 1% lidocaine were injected. The patient tolerated the procedure well. Post procedure instructions were given.

## 2012-02-05 ENCOUNTER — Ambulatory Visit (HOSPITAL_COMMUNITY)
Admission: RE | Admit: 2012-02-05 | Discharge: 2012-02-05 | Disposition: A | Discharge status: Home / Self Care | Payer: PRIVATE HEALTH INSURANCE | Source: Ambulatory Visit | Attending: Internal Medicine | Admitting: Internal Medicine

## 2012-02-05 DIAGNOSIS — Z1231 Encounter for screening mammogram for malignant neoplasm of breast: Secondary | ICD-10-CM

## 2012-02-14 ENCOUNTER — Other Ambulatory Visit: Payer: Self-pay | Admitting: Physical Medicine & Rehabilitation

## 2012-03-24 ENCOUNTER — Other Ambulatory Visit: Payer: Self-pay | Admitting: Physical Medicine & Rehabilitation

## 2012-04-15 ENCOUNTER — Ambulatory Visit: Payer: PRIVATE HEALTH INSURANCE | Admitting: Physical Medicine & Rehabilitation

## 2012-04-15 ENCOUNTER — Encounter: Payer: PRIVATE HEALTH INSURANCE | Attending: Physical Medicine & Rehabilitation

## 2012-04-15 DIAGNOSIS — M48061 Spinal stenosis, lumbar region without neurogenic claudication: Secondary | ICD-10-CM | POA: Insufficient documentation

## 2012-04-15 DIAGNOSIS — M79609 Pain in unspecified limb: Secondary | ICD-10-CM | POA: Insufficient documentation

## 2012-04-15 DIAGNOSIS — IMO0002 Reserved for concepts with insufficient information to code with codable children: Secondary | ICD-10-CM | POA: Insufficient documentation

## 2012-04-16 ENCOUNTER — Encounter: Payer: Self-pay | Admitting: Physical Medicine & Rehabilitation

## 2012-04-16 ENCOUNTER — Ambulatory Visit (HOSPITAL_BASED_OUTPATIENT_CLINIC_OR_DEPARTMENT_OTHER): Payer: PRIVATE HEALTH INSURANCE | Admitting: Physical Medicine & Rehabilitation

## 2012-04-16 VITALS — BP 115/63 | HR 72 | Resp 14 | Ht 63.0 in | Wt 169.0 lb

## 2012-04-16 DIAGNOSIS — M1711 Unilateral primary osteoarthritis, right knee: Secondary | ICD-10-CM

## 2012-04-16 DIAGNOSIS — M1710 Unilateral primary osteoarthritis, unspecified knee: Secondary | ICD-10-CM

## 2012-04-16 NOTE — Progress Notes (Signed)
Subjective:    Patient ID: ALSACE DOWD, female    DOB: Jul 24, 1951, 61 y.o.   MRN: [id number]  HPI  Pain Inventory Average Pain 8 Pain Right Now 8 My pain is sharp, stabbing and aching  In the last 24 hours, has pain interfered with the following? General activity 10 Relation with others 10 Enjoyment of life 10 What TIME of day is your pain at its worst? all the time Sleep (in general) Poor  Pain is worse with: bending, inactivity and standing Pain improves with: medication and injections Relief from Meds: 7  Mobility use a cane how many minutes can you walk? 20-30 ability to climb steps?  no do you drive?  no  Function disabled: date disabled 1999 I need assistance with the following:  shopping  Neuro/Psych bladder control problems weakness trouble walking spasms dizziness confusion anxiety loss of taste or smell  Prior Studies Any changes since last visit?  no  Physicians involved in your care Any changes since last visit?  no   Family History  Problem Relation Age of Onset  . Leukemia Mother     CLL  . Diabetes type II Mother   . Other Mother     Hypercholesterolemia  . Osteoarthritis Mother   . COPD Mother   . Hypertension Mother   . Hodgkin's lymphoma Father   . Diabetes type II Sister   . Osteoporosis Sister   . Paranoid behavior Brother     Unsure if diagnosed with Schizophrenia  . Diabetes type II Sister   . Hypertension Sister   . Diabetes type II Sister   . Hypertension Sister   . Schizophrenia Sister   . Heart disease Sister     Enlarged heart  . Glaucoma Sister   . Alcohol abuse Sister   . Glaucoma Sister   . Alcohol abuse Sister   . Bipolar disorder Daughter   . Lupus Daughter     SLE   History   Social History  . Marital Status: Divorced    Spouse Name: N/A    Number of Children: N/A  . Years of Education: N/A   Occupational History  . 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 Smoking status: Current Everyday Smoker -- 1.0 packs/day for 40 years    Types: Cigarettes  . Smokeless tobacco: None  . Alcohol Use: None  . Drug Use: None  . Sexually Active: None   Other Topics Concern  . None   Social History Narrative   DivorcedDisability mainly for back and chronic intermittent vertigo - since tumor removed - no surgical correction for backLives alone, keeps her grandson, 24 yr old daughter is a mother   Past Surgical History  Procedure Date  . Resection of left acoustic neuroma 1999  . Coronary angioplasty with stent placement 2004    Normal LV function  . Total abdominal hysterectomy w/ bilateral salpingoophorectomy 04/12/1988    For fibroid tumors; [place], [place]  . Carpal tunnel release 08/09/1998    Right  . Carpal tunnel release 09/08/1998    Left  . Removal lipoma     Right foot  . Cesarean section     x3   Past Medical History  Diagnosis Date  . S/P carpal tunnel release   . Vertigo   . Tobacco abuse   . Encounter for long-term (current) use of other medications   .  Osteoporosis   . Left acoustic neuroma     Hx of  . Diabetic peripheral neuropathy   . Mixed hyperlipidemia   . Primary osteoarthritis of both knees   . Chronic pain syndrome   . Obesity   . Hypertension   . CAD (coronary artery disease)   . Diabetes mellitus type II, controlled   . Bronchitis, acute   . Diabetes type 2, controlled   . Cancer     Renal s/p nephrectomy   BP 115/63  Pulse 72  Resp 14  Ht 5\' 3"  (1.6 m)  Wt 169 lb (76.658 kg)  BMI 29.94 kg/m2  SpO2 98%     Review of Systems  Constitutional: Positive for diaphoresis and appetite change.  Musculoskeletal: Positive for gait problem.  Neurological: Positive for dizziness and weakness.  Psychiatric/Behavioral: Positive for confusion and dysphoric mood.  All other systems reviewed and are negative.       Objective:   Physical Exam        Assessment & Plan:  1.  Right knee osteoarthritis  Right knee injection under ultrasound guidance Indication is right knee osteo- arthritisonly partially responsive to conservative care. She's had good response to ultrasound guided knee injections. Informed consent was obtained after describing risks and benefits of the procedure with patient this includes bleeding bruising and infection. She elects to proceed and has given written consent Patient placed in a recumbent position A lateral knee marked and prepped with Betadine and alcohol. 1 cc 1% lidocaine infiltrated into the skin and subcutaneous tissue A 22-gauge echo block needle 40 mm was inserted into the right suprapatellar bursa. 1 cc of 40 mg per cc Depo-Medrol and 4 cc of 1% lidocaine injected. Patient tolerated procedure well. Will inject the left knee next month

## 2012-04-30 ENCOUNTER — Telehealth: Payer: Self-pay | Admitting: Cardiovascular Disease

## 2012-04-30 ENCOUNTER — Telehealth: Payer: Self-pay

## 2012-04-30 NOTE — Telephone Encounter (Signed)
Patient called was told spoke to Dr.McAlhany he advised continue same medication.Neck pain not likely to be caused by blockage.Advised if continues to have left neck pain and irregular heart beat call back.

## 2012-04-30 NOTE — Telephone Encounter (Signed)
New msg Pt wants about heart skipping beats, no chest pain or pressure. Please call

## 2012-04-30 NOTE — Telephone Encounter (Signed)
Patient called stated last night while sitting on couch she had  pain left neck that radiated into chest then had sensation of heart skipping.States episode lasted appox 1 second.This morning she had a flushing sensation and felt like heart skipping lasted appox 1/2 second,this time no left neck pain,no chest pain.Patient stated she had carotid dopplers 06/06/11 was told she had blockage and she was worried about blockage.Will let Dr.McAlhany know and call her back.

## 2012-04-30 NOTE — Telephone Encounter (Signed)
Correction E Cardio 24 hr monitor was not put on patient entered in wrong chart.

## 2012-04-30 NOTE — Telephone Encounter (Signed)
E Cardio 24hr holter monitor was put on patient today 04/30/12 at 11:30 am.Patient will return monitor 05/02/12.

## 2012-05-18 ENCOUNTER — Other Ambulatory Visit: Payer: Self-pay | Admitting: Cardiovascular Disease

## 2012-05-24 ENCOUNTER — Ambulatory Visit (HOSPITAL_BASED_OUTPATIENT_CLINIC_OR_DEPARTMENT_OTHER): Payer: PRIVATE HEALTH INSURANCE | Admitting: Physical Medicine & Rehabilitation

## 2012-05-24 ENCOUNTER — Encounter: Payer: Self-pay | Admitting: Physical Medicine & Rehabilitation

## 2012-05-24 ENCOUNTER — Encounter: Payer: PRIVATE HEALTH INSURANCE | Attending: Physical Medicine & Rehabilitation

## 2012-05-24 VITALS — BP 122/62 | HR 72 | Resp 14 | Ht 63.0 in | Wt 161.0 lb

## 2012-05-24 DIAGNOSIS — M1710 Unilateral primary osteoarthritis, unspecified knee: Secondary | ICD-10-CM

## 2012-05-24 DIAGNOSIS — IMO0002 Reserved for concepts with insufficient information to code with codable children: Secondary | ICD-10-CM | POA: Insufficient documentation

## 2012-05-24 DIAGNOSIS — M79609 Pain in unspecified limb: Secondary | ICD-10-CM | POA: Insufficient documentation

## 2012-05-24 DIAGNOSIS — M48061 Spinal stenosis, lumbar region without neurogenic claudication: Secondary | ICD-10-CM | POA: Insufficient documentation

## 2012-05-24 MED ORDER — CYCLOBENZAPRINE HCL 5 MG PO TABS: 5.0000 mg | ORAL_TABLET | Freq: Three times a day (TID) | ORAL | Status: DC | PRN

## 2012-05-24 NOTE — Progress Notes (Signed)
Subjective:    Patient ID: Theresa Cline, female    DOB: 1951/08/07, 61 y.o.   MRN: [id number]  HPI  Pain Inventory Average Pain 9 Pain Right Now 9 My pain is sharp, stabbing and aching  In the last 24 hours, has pain interfered with the following? General activity 9 Relation with others 10 Enjoyment of life 10 What TIME of day is your pain at its worst? all the time Sleep (in general) Poor  Pain is worse with: walking, bending, inactivity and standing Pain improves with: medication Relief from Meds: 8  Mobility use a cane  Function disabled: date disabled 03/1998 I need assistance with the following:  shopping  Neuro/Psych numbness tremor tingling trouble walking dizziness confusion anxiety loss of taste or smell  Prior Studies Any changes since last visit?  no  Physicians involved in your care Any changes since last visit?  no   Family History  Problem Relation Age of Onset  . Leukemia Mother     CLL  . Diabetes type II Mother   . Other Mother     Hypercholesterolemia  . Osteoarthritis Mother   . COPD Mother   . Hypertension Mother   . Hodgkin's lymphoma Father   . Diabetes type II Sister   . Osteoporosis Sister   . Paranoid behavior Brother     Unsure if diagnosed with Schizophrenia  . Diabetes type II Sister   . Hypertension Sister   . Diabetes type II Sister   . Hypertension Sister   . Schizophrenia Sister   . Heart disease Sister     Enlarged heart  . Glaucoma Sister   . Alcohol abuse Sister   . Glaucoma Sister   . Alcohol abuse Sister   . Bipolar disorder Daughter   . Lupus Daughter     SLE   History   Social History  . Marital Status: Divorced    Spouse Name: N/A    Number of Children: N/A  . Years of Education: N/A   Occupational History  . 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 Smoking status: Current Everyday Smoker -- 1.0 packs/day for 40 years    Types:  Cigarettes  . Smokeless tobacco: None  . Alcohol Use: None  . Drug Use: None  . Sexually Active: None   Other Topics Concern  . None   Social History Narrative   DivorcedDisability mainly for back and chronic intermittent vertigo - since tumor removed - no surgical correction for backLives alone, keeps her grandson, 28 yr old daughter is a mother   Past Surgical History  Procedure Date  . Resection of left acoustic neuroma 1999  . Coronary angioplasty with stent placement 2004    Normal LV function  . Total abdominal hysterectomy w/ bilateral salpingoophorectomy 04/12/1988    For fibroid tumors; [place], [place]  . Carpal tunnel release 08/09/1998    Right  . Carpal tunnel release 09/08/1998    Left  . Removal lipoma     Right foot  . Cesarean section     x3   Past Medical History  Diagnosis Date  . S/P carpal tunnel release   . Vertigo   . Tobacco abuse   . Encounter for long-term (current) use of other medications   . Osteoporosis   . Left acoustic neuroma     Hx of  . Diabetic peripheral neuropathy   .  Mixed hyperlipidemia   . Primary osteoarthritis of both knees   . Chronic pain syndrome   . Obesity   . Hypertension   . CAD (coronary artery disease)   . Diabetes mellitus type II, controlled   . Bronchitis, acute   . Diabetes type 2, controlled   . Cancer     Renal s/p nephrectomy   BP 122/62  Pulse 72  Resp 14  Ht 5\' 3"  (1.6 m)  Wt 161 lb (73.029 kg)  BMI 28.52 kg/m2     Review of Systems  Constitutional: Positive for diaphoresis, appetite change and unexpected weight change.  Respiratory: Positive for shortness of breath.   Gastrointestinal: Positive for nausea.  Musculoskeletal: Positive for myalgias, arthralgias and gait problem.  Neurological: Positive for dizziness and numbness.  Psychiatric/Behavioral: Positive for confusion. The patient is nervous/anxious.   All other systems reviewed and are negative.       Objective:   Physical  Exam        Assessment & Plan:   Left knee injection under ultrasound guidance  Indication is left knee osteo- arthritisonly partially responsive to conservative care.  She's had good response to ultrasound guided knee injections.  Informed consent was obtained after describing risks and benefits of the procedure with patient this includes bleeding bruising and infection. She elects to proceed and has given written consent  Patient placed in a recumbent position  A lateral knee marked and prepped with Betadine and alcohol. 1 cc 1% lidocaine infiltrated into the skin and subcutaneous tissue  A 22-gauge echo block needle 40 mm was inserted into the right suprapatellar bursa. 1 cc of 40 mg per cc Depo-Medrol and 4 cc of 1% lidocaine injected. Patient tolerated procedure well.  Will inject theright knee Oct

## 2012-05-24 NOTE — Patient Instructions (Addendum)
May ice the left knee when you get home 20 minutes up to 4 times a day over the next day May resume regular activities tomorrow I will inject the right knee in October

## 2012-07-19 ENCOUNTER — Encounter: Payer: PRIVATE HEALTH INSURANCE | Attending: Physical Medicine & Rehabilitation

## 2012-07-19 ENCOUNTER — Ambulatory Visit (HOSPITAL_BASED_OUTPATIENT_CLINIC_OR_DEPARTMENT_OTHER): Payer: PRIVATE HEALTH INSURANCE | Admitting: Physical Medicine & Rehabilitation

## 2012-07-19 ENCOUNTER — Encounter: Payer: Self-pay | Admitting: Physical Medicine & Rehabilitation

## 2012-07-19 VITALS — BP 114/72 | HR 76 | Resp 16 | Ht 63.0 in | Wt 166.0 lb

## 2012-07-19 DIAGNOSIS — M1711 Unilateral primary osteoarthritis, right knee: Secondary | ICD-10-CM

## 2012-07-19 DIAGNOSIS — IMO0002 Reserved for concepts with insufficient information to code with codable children: Secondary | ICD-10-CM | POA: Insufficient documentation

## 2012-07-19 DIAGNOSIS — M48061 Spinal stenosis, lumbar region without neurogenic claudication: Secondary | ICD-10-CM | POA: Insufficient documentation

## 2012-07-19 DIAGNOSIS — M1710 Unilateral primary osteoarthritis, unspecified knee: Secondary | ICD-10-CM

## 2012-07-19 DIAGNOSIS — M79609 Pain in unspecified limb: Secondary | ICD-10-CM | POA: Insufficient documentation

## 2012-07-19 NOTE — Progress Notes (Signed)
Subjective:    Patient ID: Theresa Cline, female    DOB: 12-16-1950, 61 y.o.   MRN: [id number]  HPI  Pain Inventory Average Pain 8 Pain Right Now 8 My pain is sharp, stabbing and aching  In the last 24 hours, has pain interfered with the following? General activity 10 Relation with others 10 Enjoyment of life 10 What TIME of day is your pain at its worst? all the time Sleep (in general) Poor  Pain is worse with: bending, inactivity and standing Pain improves with: medication and injections Relief from Meds: 7  Mobility use a cane how many minutes can you walk? 20-30 ability to climb steps?  no do you drive?  no  Function disabled: date disabled 1999 I need assistance with the following:  shopping  Neuro/Psych bladder control problems weakness trouble walking spasms dizziness confusion anxiety loss of taste or smell  Prior Studies Any changes since last visit?  no  Physicians involved in your care Any changes since last visit?  no   Family History  Problem Relation Age of Onset  . Leukemia Mother     CLL  . Diabetes type II Mother   . Other Mother     Hypercholesterolemia  . Osteoarthritis Mother   . COPD Mother   . Hypertension Mother   . Hodgkin's lymphoma Father   . Diabetes type II Sister   . Osteoporosis Sister   . Paranoid behavior Brother     Unsure if diagnosed with Schizophrenia  . Diabetes type II Sister   . Hypertension Sister   . Diabetes type II Sister   . Hypertension Sister   . Schizophrenia Sister   . Heart disease Sister     Enlarged heart  . Glaucoma Sister   . Alcohol abuse Sister   . Glaucoma Sister   . Alcohol abuse Sister   . Bipolar disorder Daughter   . Lupus Daughter     SLE   History   Social History  . Marital Status: Divorced    Spouse Name: N/A    Number of Children: N/A  . Years of Education: N/A   Occupational History  . 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 Smoking status: Current Every Day Smoker -- 1.0 packs/day for 40 years    Types: Cigarettes  . Smokeless tobacco: None  . Alcohol Use: None  . Drug Use: None  . Sexually Active: None   Other Topics Concern  . None   Social History Narrative   DivorcedDisability mainly for back and chronic intermittent vertigo - since tumor removed - no surgical correction for backLives alone, keeps her grandson, 90 yr old daughter is a mother   Past Surgical History  Procedure Date  . Resection of left acoustic neuroma 1999  . Coronary angioplasty with stent placement 2004    Normal LV function  . Total abdominal hysterectomy w/ bilateral salpingoophorectomy 04/12/1988    For fibroid tumors; [place], [place]  . Carpal tunnel release 08/09/1998    Right  . Carpal tunnel release 09/08/1998    Left  . Removal lipoma     Right foot  . Cesarean section     x3   Past Medical History  Diagnosis Date  . S/P carpal tunnel release   . Vertigo   . Tobacco abuse   . Encounter for long-term (current) use of other medications   .  Osteoporosis   . Left acoustic neuroma     Hx of  . Diabetic peripheral neuropathy   . Mixed hyperlipidemia   . Primary osteoarthritis of both knees   . Chronic pain syndrome   . Obesity   . Hypertension   . CAD (coronary artery disease)   . Diabetes mellitus type II, controlled   . Bronchitis, acute   . Diabetes type 2, controlled   . Cancer     Renal s/p nephrectomy   BP 114/72  Pulse 76  Resp 16  Ht 5\' 3"  (1.6 m)  Wt 166 lb (75.297 kg)  BMI 29.41 kg/m2  SpO2 98%     Review of Systems  Constitutional: Positive for diaphoresis and appetite change.  Musculoskeletal: Positive for gait problem.  Neurological: Positive for dizziness and weakness.  Psychiatric/Behavioral: Positive for confusion and dysphoric mood.  All other systems reviewed and are negative.       Objective:   Physical Exam        Assessment & Plan:  1.  Right knee osteoarthritis  Right knee injection under ultrasound guidance Indication is right knee osteo- arthritisonly partially responsive to conservative care. She's had good response to ultrasound guided knee injections. Informed consent was obtained after describing risks and benefits of the procedure with patient this includes bleeding bruising and infection. She elects to proceed and has given written consent Patient placed in a recumbent position A lateral knee marked and prepped with Betadine and alcohol. 1 cc 1% lidocaine infiltrated into the skin and subcutaneous tissue A 22-gauge echo block needle 40 mm was inserted into the right suprapatellar bursa. 1 cc of 40 mg per cc Depo-Medrol and 4 cc of 1% lidocaine injected. Patient tolerated procedure well. Will inject the left knee next month

## 2012-07-19 NOTE — Patient Instructions (Signed)
you'll see me in one month for the left knee injection I recommend aquatic exercise or bicycling to help reduce knee pain during exercise

## 2012-08-29 ENCOUNTER — Ambulatory Visit (HOSPITAL_BASED_OUTPATIENT_CLINIC_OR_DEPARTMENT_OTHER): Payer: PRIVATE HEALTH INSURANCE | Admitting: Physical Medicine & Rehabilitation

## 2012-08-29 ENCOUNTER — Encounter: Payer: Self-pay | Admitting: Physical Medicine & Rehabilitation

## 2012-08-29 ENCOUNTER — Encounter: Payer: PRIVATE HEALTH INSURANCE | Attending: Physical Medicine & Rehabilitation

## 2012-08-29 VITALS — BP 146/86 | HR 69 | Resp 14 | Ht 63.0 in | Wt 167.0 lb

## 2012-08-29 DIAGNOSIS — IMO0002 Reserved for concepts with insufficient information to code with codable children: Secondary | ICD-10-CM | POA: Insufficient documentation

## 2012-08-29 DIAGNOSIS — M79609 Pain in unspecified limb: Secondary | ICD-10-CM | POA: Insufficient documentation

## 2012-08-29 DIAGNOSIS — M25461 Effusion, right knee: Secondary | ICD-10-CM

## 2012-08-29 DIAGNOSIS — M48061 Spinal stenosis, lumbar region without neurogenic claudication: Secondary | ICD-10-CM | POA: Insufficient documentation

## 2012-08-29 DIAGNOSIS — M25469 Effusion, unspecified knee: Secondary | ICD-10-CM

## 2012-08-29 MED ORDER — DICLOFENAC SODIUM 1 % TD GEL: 2.0000 g | Freq: Four times a day (QID) | TRANSDERMAL | Status: DC

## 2012-08-29 NOTE — Patient Instructions (Signed)
Will run some fluid analysis to see whether he may have gout or pseudogout If this looks normal then you'll need to see Dr. Devonne Doughty about knee arthroscopy or MRI We will start Voltaren gel for your knee pain Your ultrasound showed no tears of your ligament on this side either knee or your patellar lig

## 2012-08-29 NOTE — Progress Notes (Signed)
  Subjective:    Patient ID: Theresa Cline, female    DOB: January 14, 1951, 61 y.o.   MRN: [id number]  HPI  Complains of right greater than left knee pain. Underwent right knee injection last month which lasted for short  Time No hip pain.  Review of Systems     Objective:   Physical Exam  Musculoskeletal:       Right knee: She exhibits decreased range of motion, swelling and bony tenderness. She exhibits no ecchymosis, no deformity, no erythema, no LCL laxity, normal patellar mobility and no MCL laxity. tenderness found. Patellar tendon tenderness noted.       Left knee: She exhibits decreased range of motion and swelling. She exhibits no ecchymosis, no deformity, no erythema, no LCL laxity, normal patellar mobility and no MCL laxity. tenderness found. Patellar tendon tenderness noted.          Assessment & Plan:  1. Knee pain Limited ultrasound of the right knee demonstrates intact MCL Normal LCL No suprapatellar effusion Patellar tendon is normal Popliteal fossa bursitis 1 cm x 2.5 cm at a depth of 1.5 cm  Ultrasound guided aspiration of the Baker cyst Right knee Area was pre-scanned marked and prepped with Betadine and under direct ultrasound imaging visualization a 21-gauge 2 inch needle was inserted into the Baker's cyst. 3 cc of fluid were aspirated. This was clear no blood tinge Synovial fluid was sent for crystals  If Crystal announces is negative then recommend orthopedic referral to assess for meniscal tear Trial of Voltaren gel

## 2012-09-17 ENCOUNTER — Telehealth: Payer: Self-pay

## 2012-09-17 DIAGNOSIS — M1711 Unilateral primary osteoarthritis, right knee: Secondary | ICD-10-CM

## 2012-09-17 NOTE — Telephone Encounter (Signed)
Patient tried to make appointment with orthopedic but they would like a referral.  She would like to go to Dr Patsy Lager.  Please advise and place order.

## 2012-09-17 NOTE — Telephone Encounter (Signed)
I ordered a referral not sure if it's been a work

## 2012-09-18 NOTE — Telephone Encounter (Signed)
Referral faxed to [facility].  [phone] Fax.

## 2012-09-24 ENCOUNTER — Encounter: Payer: PRIVATE HEALTH INSURANCE | Attending: Physical Medicine & Rehabilitation

## 2012-09-24 ENCOUNTER — Encounter: Payer: Self-pay | Admitting: Physical Medicine & Rehabilitation

## 2012-09-24 ENCOUNTER — Ambulatory Visit (HOSPITAL_BASED_OUTPATIENT_CLINIC_OR_DEPARTMENT_OTHER): Payer: PRIVATE HEALTH INSURANCE | Admitting: Physical Medicine & Rehabilitation

## 2012-09-24 VITALS — BP 114/61 | HR 81 | Resp 14 | Ht 63.0 in | Wt 171.4 lb

## 2012-09-24 DIAGNOSIS — E1140 Type 2 diabetes mellitus with diabetic neuropathy, unspecified: Secondary | ICD-10-CM

## 2012-09-24 DIAGNOSIS — M48061 Spinal stenosis, lumbar region without neurogenic claudication: Secondary | ICD-10-CM | POA: Insufficient documentation

## 2012-09-24 DIAGNOSIS — IMO0002 Reserved for concepts with insufficient information to code with codable children: Secondary | ICD-10-CM | POA: Insufficient documentation

## 2012-09-24 DIAGNOSIS — M25561 Pain in right knee: Secondary | ICD-10-CM

## 2012-09-24 DIAGNOSIS — E1142 Type 2 diabetes mellitus with diabetic polyneuropathy: Secondary | ICD-10-CM

## 2012-09-24 DIAGNOSIS — M1710 Unilateral primary osteoarthritis, unspecified knee: Secondary | ICD-10-CM

## 2012-09-24 DIAGNOSIS — E1149 Type 2 diabetes mellitus with other diabetic neurological complication: Secondary | ICD-10-CM

## 2012-09-24 DIAGNOSIS — M79609 Pain in unspecified limb: Secondary | ICD-10-CM | POA: Insufficient documentation

## 2012-09-24 DIAGNOSIS — M25569 Pain in unspecified knee: Secondary | ICD-10-CM

## 2012-09-24 MED ORDER — TRAMADOL HCL 50 MG PO TABS: 100.0000 mg | ORAL_TABLET | Freq: Three times a day (TID) | ORAL | Status: DC | PRN

## 2012-09-24 NOTE — Progress Notes (Signed)
Subjective:    Patient ID: Theresa Cline, female    DOB: 01/12/1951, 61 y.o.   MRN: [id number]  HPI History of renal carcinoma which is doing well. Has another appointment with urology in about 6 months. Has one kidney. Complains of numbness in the right foot as well as left little toe Has a history of diabetic neuropathy Also has a history of lumbar stenosis with last MRI in 2011. This demonstrated L4-L5 stenosis. In addition she has a history of knee pain. Did not benefit from ultrasound-guided knee injection in October or with aspiration of Baker cyst in November. Pain Inventory Average Pain 10 Pain Right Now 10 My pain is sharp, stabbing and aching  In the last 24 hours, has pain interfered with the following? General activity 10 Relation with others 10 Enjoyment of life 10 What TIME of day is your pain at its worst? all of the time Sleep (in general) Poor  Pain is worse with: walking, inactivity and standing Pain improves with: medication Relief from Meds: 2  Mobility use a cane how many minutes can you walk? 5-10 ability to climb steps?  no do you drive?  no  Function disabled: date disabled 1999 I need assistance with the following:  shopping  Neuro/Psych bladder control problems weakness numbness tremor tingling trouble walking dizziness confusion anxiety loss of taste or smell  Prior Studies Any changes since last visit?  no  Physicians involved in your care Any changes since last visit?  no   Family History  Problem Relation Age of Onset  . Leukemia Mother     CLL  . Diabetes type II Mother   . Other Mother     Hypercholesterolemia  . Osteoarthritis Mother   . COPD Mother   . Hypertension Mother   . Hodgkin's lymphoma Father   . Diabetes type II Sister   . Osteoporosis Sister   . Paranoid behavior Brother     Unsure if diagnosed with Schizophrenia  . Diabetes type II Sister   . Hypertension Sister   . Diabetes type II Sister   .  Hypertension Sister   . Schizophrenia Sister   . Heart disease Sister     Enlarged heart  . Glaucoma Sister   . Alcohol abuse Sister   . Glaucoma Sister   . Alcohol abuse Sister   . Bipolar disorder Daughter   . Lupus Daughter     SLE   History   Social History  . Marital Status: Divorced    Spouse Name: N/A    Number of Children: N/A  . Years of Education: N/A   Occupational History  . 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 Smoking status: Current Every Day Smoker -- 1.0 packs/day for 40 years    Types: Cigarettes  . Smokeless tobacco: Never Used  . Alcohol Use: None  . Drug Use: None  . Sexually Active: None   Other Topics Concern  . None   Social History Narrative   DivorcedDisability mainly for back and chronic intermittent vertigo - since tumor removed - no surgical correction for backLives alone, keeps her grandson, 61 yr old daughter is a mother   Past Surgical History  Procedure Date  . Resection of left acoustic neuroma 1999  . Coronary angioplasty with stent placement 2004    Normal LV function  . Total abdominal hysterectomy w/ bilateral salpingoophorectomy 04/12/1988  For fibroid tumors; [place], [place]  . Carpal tunnel release 08/09/1998    Right  . Carpal tunnel release 09/08/1998    Left  . Removal lipoma     Right foot  . Cesarean section     x3   Past Medical History  Diagnosis Date  . S/P carpal tunnel release   . Vertigo   . Tobacco abuse   . Encounter for long-term (current) use of other medications   . Osteoporosis   . Left acoustic neuroma     Hx of  . Diabetic peripheral neuropathy   . Mixed hyperlipidemia   . Primary osteoarthritis of both knees   . Chronic pain syndrome   . Obesity   . Hypertension   . CAD (coronary artery disease)   . Diabetes mellitus type II, controlled   . Bronchitis, acute   . Diabetes type 2, controlled   . Cancer     Renal s/p nephrectomy    BP 114/61  Pulse 81  Resp 14  Ht 5\' 3"  (1.6 m)  Wt 171 lb 6.4 oz (77.747 kg)  BMI 30.36 kg/m2  SpO2 96%    Review of Systems  Constitutional: Positive for diaphoresis and appetite change.  Cardiovascular: Positive for leg swelling.  Musculoskeletal:       Knee pain       Objective:   Physical Exam  Constitutional: She is oriented to person, place, and time. She appears well-developed and well-nourished.  HENT:  Head: Normocephalic and atraumatic.  Musculoskeletal:       Right knee: She exhibits effusion. She exhibits normal range of motion, no LCL laxity, normal patellar mobility and no MCL laxity.       Lumbar back: She exhibits decreased range of motion and pain.       Negative SLR Lumbosacral paraspinal tenderness  Neurological: She is alert and oriented to person, place, and time. She has normal strength.       Decreased sensation around the dorsal foot scar which was from attempted lipoma resection by podiatrist Left S1 dermatomal reduced sensation  Psychiatric: She has a normal mood and affect.          Assessment & Plan:  1. Bilateral knee pain I believe she has meniscal tears and would like orthopedics to reevaluate.  2. Lumbar spinal stenosis certainly this can cause pain radiating to the lower tremor these. Also causing back pain  3. Diabetic neuropathy her exam does not show a glove stocking distribution.  4. Chronic pain syndrome we'll change from Ultracet to tramadol,If orthopedics does not think the knees are the main cause of her knee pain will need to reorder MRI of the lumbar spine to see if there is any progression in spinal stenosis  Not a good candidate for nonsteroidal anti-inflammatories due to one kidney  Over half of the 25 min visit was spent counseling and coordinating care.

## 2012-10-04 ENCOUNTER — Telehealth: Payer: Self-pay | Admitting: *Deleted

## 2012-10-04 NOTE — Telephone Encounter (Signed)
Orthopedic office has been called and they have received the referral. They will be in touch with patient once they get word from doctor.  Patient is aware.

## 2012-10-04 NOTE — Telephone Encounter (Signed)
Calling to find out if referral was made to orthopedics and what she should do.  Diane I see the order. Can you let Samona know if this has been done?

## 2012-10-05 ENCOUNTER — Emergency Department (HOSPITAL_COMMUNITY)
Admission: EM | Admit: 2012-10-05 | Discharge: 2012-10-05 | Disposition: A | Discharge status: Home / Self Care | Payer: PRIVATE HEALTH INSURANCE | Attending: Emergency Medicine | Admitting: Emergency Medicine

## 2012-10-05 ENCOUNTER — Encounter (HOSPITAL_COMMUNITY): Payer: Self-pay | Admitting: Emergency Medicine

## 2012-10-05 DIAGNOSIS — R609 Edema, unspecified: Secondary | ICD-10-CM | POA: Insufficient documentation

## 2012-10-05 DIAGNOSIS — Z79899 Other long term (current) drug therapy: Secondary | ICD-10-CM | POA: Insufficient documentation

## 2012-10-05 DIAGNOSIS — R269 Unspecified abnormalities of gait and mobility: Secondary | ICD-10-CM | POA: Insufficient documentation

## 2012-10-05 DIAGNOSIS — F17200 Nicotine dependence, unspecified, uncomplicated: Secondary | ICD-10-CM | POA: Insufficient documentation

## 2012-10-05 DIAGNOSIS — E1149 Type 2 diabetes mellitus with other diabetic neurological complication: Secondary | ICD-10-CM | POA: Insufficient documentation

## 2012-10-05 DIAGNOSIS — E782 Mixed hyperlipidemia: Secondary | ICD-10-CM | POA: Insufficient documentation

## 2012-10-05 DIAGNOSIS — I10 Essential (primary) hypertension: Secondary | ICD-10-CM | POA: Insufficient documentation

## 2012-10-05 DIAGNOSIS — M549 Dorsalgia, unspecified: Secondary | ICD-10-CM | POA: Insufficient documentation

## 2012-10-05 DIAGNOSIS — G894 Chronic pain syndrome: Secondary | ICD-10-CM | POA: Insufficient documentation

## 2012-10-05 DIAGNOSIS — G909 Disorder of the autonomic nervous system, unspecified: Secondary | ICD-10-CM | POA: Insufficient documentation

## 2012-10-05 DIAGNOSIS — D649 Anemia, unspecified: Secondary | ICD-10-CM

## 2012-10-05 DIAGNOSIS — I252 Old myocardial infarction: Secondary | ICD-10-CM | POA: Insufficient documentation

## 2012-10-05 DIAGNOSIS — E669 Obesity, unspecified: Secondary | ICD-10-CM | POA: Insufficient documentation

## 2012-10-05 DIAGNOSIS — I2510 Atherosclerotic heart disease of native coronary artery without angina pectoris: Secondary | ICD-10-CM | POA: Insufficient documentation

## 2012-10-05 DIAGNOSIS — M810 Age-related osteoporosis without current pathological fracture: Secondary | ICD-10-CM | POA: Insufficient documentation

## 2012-10-05 DIAGNOSIS — Z8739 Personal history of other diseases of the musculoskeletal system and connective tissue: Secondary | ICD-10-CM | POA: Insufficient documentation

## 2012-10-05 DIAGNOSIS — Z8669 Personal history of other diseases of the nervous system and sense organs: Secondary | ICD-10-CM | POA: Insufficient documentation

## 2012-10-05 DIAGNOSIS — R600 Localized edema: Secondary | ICD-10-CM

## 2012-10-05 DIAGNOSIS — Z8709 Personal history of other diseases of the respiratory system: Secondary | ICD-10-CM | POA: Insufficient documentation

## 2012-10-05 LAB — CBC WITH DIFFERENTIAL/PLATELET
Basophils Absolute: 0 K/uL (ref 0.0–0.1)
Basophils Relative: 1 % (ref 0–1)
Eosinophils Absolute: 0.2 10*3/uL (ref 0.0–0.7)
Eosinophils Relative: 4 % (ref 0–5)
HCT: 32.4 % — ABNORMAL LOW (ref 36.0–46.0)
Hemoglobin: 10.7 g/dL — ABNORMAL LOW (ref 12.0–15.0)
Lymphocytes Relative: 34 % (ref 12–46)
Lymphs Abs: 2 10*3/uL (ref 0.7–4.0)
MCH: 29.3 pg (ref 26.0–34.0)
MCHC: 33 g/dL (ref 30.0–36.0)
MCV: 88.8 fL (ref 78.0–100.0)
Monocytes Absolute: 0.6 K/uL (ref 0.1–1.0)
Monocytes Relative: 11 % (ref 3–12)
Neutro Abs: 2.9 K/uL (ref 1.7–7.7)
Neutrophils Relative %: 51 % (ref 43–77)
Platelets: 222 K/uL (ref 150–400)
RBC: 3.65 MIL/uL — ABNORMAL LOW (ref 3.87–5.11)
RDW: 12.7 % (ref 11.5–15.5)
WBC: 5.7 K/uL (ref 4.0–10.5)

## 2012-10-05 LAB — COMPREHENSIVE METABOLIC PANEL
Albumin: 3.4 g/dL — ABNORMAL LOW (ref 3.5–5.2)
BUN: 17 mg/dL (ref 6–23)
Calcium: 9.6 mg/dL (ref 8.4–10.5)
Creatinine, Ser: 1.06 mg/dL (ref 0.50–1.10)
GFR calc Af Amer: 63 mL/min — ABNORMAL LOW (ref >90)
Glucose, Bld: 80 mg/dL (ref 70–99)
Total Protein: 7.2 g/dL (ref 6.0–8.3)

## 2012-10-05 LAB — COMPREHENSIVE METABOLIC PANEL WITH GFR
ALT: 8 U/L (ref 0–35)
AST: 20 U/L (ref 0–37)
Alkaline Phosphatase: 67 U/L (ref 39–117)
CO2: 30 meq/L (ref 19–32)
Chloride: 97 meq/L (ref 96–112)
GFR calc non Af Amer: 55 mL/min — ABNORMAL LOW (ref >90)
Potassium: 3.8 meq/L (ref 3.5–5.1)
Sodium: 137 meq/L (ref 135–145)
Total Bilirubin: 0.2 mg/dL — ABNORMAL LOW (ref 0.3–1.2)

## 2012-10-05 LAB — URINALYSIS, ROUTINE W REFLEX MICROSCOPIC
Bilirubin Urine: NEGATIVE
Glucose, UA: NEGATIVE mg/dL
Hgb urine dipstick: NEGATIVE
Ketones, ur: NEGATIVE mg/dL
Leukocytes, UA: NEGATIVE
Nitrite: NEGATIVE
Protein, ur: NEGATIVE mg/dL
Specific Gravity, Urine: 1.009 (ref 1.005–1.030)
Urobilinogen, UA: 0.2 mg/dL (ref 0.0–1.0)
pH: 5.5 (ref 5.0–8.0)

## 2012-10-05 LAB — PRO B NATRIURETIC PEPTIDE: Pro B Natriuretic peptide (BNP): 77.6 pg/mL (ref 0–125)

## 2012-10-05 MED ORDER — OXYCODONE-ACETAMINOPHEN 5-325 MG PO TABS: ORAL_TABLET | ORAL | Status: DC

## 2012-10-05 MED ORDER — OXYCODONE-ACETAMINOPHEN 5-325 MG PO TABS
2.0000 | ORAL_TABLET | Freq: Once | ORAL | Status: AC
  Administered 2012-10-05: 2 via ORAL
  Filled 2012-10-05: qty 2

## 2012-10-05 MED ORDER — FUROSEMIDE 20 MG PO TABS
20.0000 mg | ORAL_TABLET | Freq: Once | ORAL | Status: AC
  Administered 2012-10-05: 20 mg via ORAL
  Filled 2012-10-05: qty 1

## 2012-10-05 MED ORDER — POTASSIUM CHLORIDE ER 10 MEQ PO TBCR: 10.0000 meq | EXTENDED_RELEASE_TABLET | Freq: Every day | ORAL | Status: DC

## 2012-10-05 MED ORDER — FUROSEMIDE 20 MG PO TABS: 20.0000 mg | ORAL_TABLET | Freq: Every day | ORAL | Status: DC

## 2012-10-05 NOTE — ED Notes (Signed)
Pt c/o leg swelling since Thanksgiving that has gotten worse for the past 3 days.  Takes gabapentin for diabetic neuropathy.

## 2012-10-05 NOTE — ED Provider Notes (Signed)
History     CSN: [id number]  Arrival date & time 10/05/12  1030   First MD Initiated Contact with Patient 10/05/12 1138      Chief Complaint  Patient presents with  . Leg Swelling    (Consider location/radiation/quality/duration/timing/severity/associated sxs/prior treatment) HPI Comments: Patient has a worsening history of bilateral lower extremity pain and swelling over the last several days. She reports that she's had a problem with swelling for the last one to 2 years. She reports that she's had back problems and gait difficulty exacerbated by a history of vertigo for approximately 20 years. She does use a cane most of the time when she is ambulatory. Family reports that she was evaluated in the emergency once before for the swelling with no definitive diagnosis. She also sees her primary care physician but no definite diagnosis has been given at that time either. She does take Diovan for blood pressure. 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 She does not have a history of congestive heart failure that she is aware of. She also denies any liver problems. No recent change in medications. No fever or chills, rash. She reports both legs are symmetric and equally painful. She takes tramadol for chronic pain and also for discomfort in her right knee that has been a problem recently. That is not enough to take care of her pain issue and thus came to the emergency department for further evaluation. She denies any chest pain, shortness of breath, pleurisy. No cough, congestion, sore throat or other symptoms of a URI.  The history is provided by the patient, a relative and medical records.    Past Medical History  Diagnosis Date  . S/P carpal tunnel release   . Vertigo   . Tobacco abuse   . Encounter for long-term (current) use of other medications   . Osteoporosis   . Left acoustic neuroma     Hx of  . Diabetic peripheral neuropathy   . Mixed  hyperlipidemia   . Primary osteoarthritis of both knees   . Chronic pain syndrome   . Obesity   . Hypertension   . CAD (coronary artery disease)   . Diabetes mellitus type II, controlled   . Bronchitis, acute   . Diabetes type 2, controlled   . Cancer     Renal s/p nephrectomy    Past Surgical History  Procedure Date  . Resection of left acoustic neuroma 1999  . Coronary angioplasty with stent placement 2004    Normal LV function  . Total abdominal hysterectomy w/ bilateral salpingoophorectomy 04/12/1988    For fibroid tumors; [place], [place]  . Carpal tunnel release 08/09/1998    Right  . Carpal tunnel release 09/08/1998    Left  . Removal lipoma     Right foot  . Cesarean section     x3    Family History  Problem Relation Age of Onset  . Leukemia Mother     CLL  . Diabetes type II Mother   . Other Mother     Hypercholesterolemia  . Osteoarthritis Mother   . COPD Mother   . Hypertension Mother   . Hodgkin's lymphoma Father   . Diabetes type II Sister   . Osteoporosis Sister   . Paranoid behavior Brother     Unsure if diagnosed with Schizophrenia  . Diabetes type II Sister   . Hypertension Sister   . Diabetes type II Sister   .  Hypertension Sister   . Schizophrenia Sister   . Heart disease Sister     Enlarged heart  . Glaucoma Sister   . Alcohol abuse Sister   . Glaucoma Sister   . Alcohol abuse Sister   . Bipolar disorder Daughter   . Lupus Daughter     SLE    History  Substance Use Topics  . Smoking status: Current Every Day Smoker -- 1.0 packs/day for 40 years    Types: Cigarettes  . Smokeless tobacco: Never Used  . Alcohol Use: Not on file    OB History    Grav Para Term Preterm Abortions TAB SAB Ect Mult Living                  Review of Systems  Constitutional: Negative for fever and chills.  HENT: Negative for congestion, sore throat and rhinorrhea.   Respiratory: Negative for chest tightness.   Cardiovascular: Positive for leg  swelling. Negative for chest pain and palpitations.  Gastrointestinal: Negative for nausea, vomiting and abdominal pain.  Musculoskeletal: Positive for back pain and gait problem.  Neurological: Negative for headaches.  All other systems reviewed and are negative.    Allergies  Adhesive  Home Medications   Current Outpatient Rx  Name  Route  Sig  Dispense  Refill  . ALENDRONATE SODIUM 70 MG PO TABS   Oral   Take 70 mg by mouth every 7 (seven) days. Take with a full glass of water on an empty stomach.          . AMLODIPINE BESYLATE 5 MG PO TABS   Oral   Take 5 mg by mouth daily.         . ATORVASTATIN CALCIUM 40 MG PO TABS   Oral   Take 40 mg by mouth daily.          Marland Kitchen VITAMIN D3 2000 UNITS PO TABS   Oral   Take 1 tablet by mouth daily.           Marland Kitchen CITALOPRAM HYDROBROMIDE 40 MG PO TABS   Oral   Take 40 mg by mouth daily. **Needs office visit.**          . CYCLOBENZAPRINE HCL 5 MG PO TABS   Oral   Take 10 mg by mouth 3 (three) times daily as needed.         . OMEGA-3 FATTY ACIDS 1000 MG PO CAPS   Oral   Take 1 g by mouth daily.           Marland Kitchen METFORMIN HCL 1000 MG PO TABS   Oral   Take 1,000 mg by mouth 2 (two) times daily with a meal.           . METOPROLOL SUCCINATE ER 100 MG PO TB24   Oral   Take 100 mg by mouth daily.           Marland Kitchen OMEPRAZOLE 40 MG PO CPDR   Oral   Take 40 mg by mouth daily.           . TRAMADOL HCL 50 MG PO TABS   Oral   Take 2 tablets (100 mg total) by mouth every 8 (eight) hours as needed for pain.   180 tablet   1   . VALSARTAN-HYDROCHLOROTHIAZIDE 160-12.5 MG PO TABS   Oral   Take 1 tablet by mouth 2 (two) times daily.           . FUROSEMIDE 20 MG  PO TABS   Oral   Take 1 tablet (20 mg total) by mouth daily.   7 tablet   0   . OXYCODONE-ACETAMINOPHEN 5-325 MG PO TABS      1-2 tablets po q 6 hours prn moderate to severe pain   20 tablet   0   . POTASSIUM CHLORIDE ER 10 MEQ PO TBCR   Oral   Take 1  tablet (10 mEq total) by mouth daily.   7 tablet   0     BP 112/55  Pulse 77  Temp 97.6 F (36.4 C) (Oral)  Resp 16  SpO2 99%  Physical Exam  Vitals reviewed. Constitutional: She appears well-developed and well-nourished.  HENT:  Head: Normocephalic and atraumatic.  Neck: Normal range of motion. Neck supple.  Cardiovascular: Normal rate, regular rhythm, S1 normal and S2 normal.  Exam reveals no decreased pulses.   No murmur heard. Pulses:      Dorsalis pedis pulses are 2+ on the right side, and 2+ on the left side.  Pulmonary/Chest: Effort normal. No respiratory distress. She has no wheezes.  Abdominal: Soft. She exhibits no distension. There is no tenderness.  Musculoskeletal:       Right lower leg: She exhibits tenderness, swelling and edema. She exhibits no bony tenderness, no deformity and no laceration.       Left lower leg: She exhibits tenderness, swelling and edema. She exhibits no bony tenderness, no deformity and no laceration.       B lower extremity swelling, edema at 2 +.  Diffuse tenderness at both lower legs.  Neurological: She is alert.  Skin: Skin is warm.    ED Course  Procedures (including critical care time)  Labs Reviewed  CBC WITH DIFFERENTIAL - Abnormal; Notable for the following:    RBC 3.65 (*)     Hemoglobin 10.7 (*)     HCT 32.4 (*)     All other components within normal limits  COMPREHENSIVE METABOLIC PANEL - Abnormal; Notable for the following:    Albumin 3.4 (*)     Total Bilirubin 0.2 (*)     GFR calc non Af Amer 55 (*)     GFR calc Af Amer 63 (*)     All other components within normal limits  PRO B NATRIURETIC PEPTIDE  URINALYSIS, ROUTINE W REFLEX MICROSCOPIC   No results found.   1. Peripheral edema   2. Anemia     ra sat is 99% and I interpret to be normal  MDM   Patient with acute on chronic lower extremity edema that is symmetric. No significant risk factors for DVT or PE. BNP is normal. She's not in any respiratory  distress. She denies any chest pain or pleuritic pain. Her renal function is normal. Her hemoglobin is slightly low but close to her baseline. She is given TED hose for her symptoms and Percocet was given which reports significantly improve her pain. Plan is for her to followup as an outpatient with her primary care physician. I will also prescribe her by mouth Lasix. She reports that she has been on Lasix in the past who her primary care physician and at that time her potassium was relative lower. We'll also give her a supplement of potassium to take with her Lasix.        Gavin Pound. Odalys Win, MD 10/05/12 1440

## 2012-10-05 NOTE — Discharge Instructions (Signed)
Edema Edema is an abnormal build-up of fluids in tissues. Because this is partly dependent on gravity (water flows to the lowest place), it is more common in the legs and thighs (lower extremities). It is also common in the looser tissues, like around the eyes. Painless swelling of the feet and ankles is common and increases as a person ages. It may affect both legs and may include the calves or even thighs. When squeezed, the fluid may move out of the affected area and may leave a dent for a few moments. CAUSES   Prolonged standing or sitting in one place for extended periods of time. Movement helps pump tissue fluid into the veins, and absence of movement prevents this, resulting in edema.  Varicose veins. The valves in the veins do not work as well as they should. This causes fluid to leak into the tissues.  Fluid and salt overload.  Injury, burn, or surgery to the leg, ankle, or foot, may damage veins and allow fluid to leak out.  Sunburn damages vessels. Leaky vessels allow fluid to go out into the sunburned tissues.  Allergies (from insect bites or stings, medications or chemicals) cause swelling by allowing vessels to become leaky.  Protein in the blood helps keep fluid in your vessels. Low protein, as in malnutrition, allows fluid to leak out.  Hormonal changes, including pregnancy and menstruation, cause fluid retention. This fluid may leak out of vessels and cause edema.  Medications that cause fluid retention. Examples are sex hormones, blood pressure medications, steroid treatment, or anti-depressants.  Some illnesses cause edema, especially heart failure, kidney disease, or liver disease.  Surgery that cuts veins or lymph nodes, such as surgery done for the heart or for breast cancer, may result in edema. DIAGNOSIS  Your caregiver is usually easily able to determine what is causing your swelling (edema) by simply asking what is wrong (getting a history) and examining you (doing  a physical). Sometimes x-rays, EKG (electrocardiogram or heart tracing), and blood work may be done to evaluate for underlying medical illness. TREATMENT  General treatment includes:  Leg elevation (or elevation of the affected body part).  Restriction of fluid intake.  Prevention of fluid overload.  Compression of the affected body part. Compression with elastic bandages or support stockings squeezes the tissues, preventing fluid from entering and forcing it back into the blood vessels.  Diuretics (also called water pills or fluid pills) pull fluid out of your body in the form of increased urination. These are effective in reducing the swelling, but can have side effects and must be used only under your caregiver's supervision. Diuretics are appropriate only for some types of edema. The specific treatment can be directed at any underlying causes discovered. Heart, liver, or kidney disease should be treated appropriately. HOME CARE INSTRUCTIONS   Elevate the legs (or affected body part) above the level of the heart, while lying down.  Avoid sitting or standing still for prolonged periods of time.  Avoid putting anything directly under the knees when lying down, and do not wear constricting clothing or garters on the upper legs.  Exercising the legs causes the fluid to work back into the veins and lymphatic channels. This may help the swelling go down.  The pressure applied by elastic bandages or support stockings can help reduce ankle swelling.  A low-salt diet may help reduce fluid retention and decrease the ankle swelling.  Take any medications exactly as prescribed. SEEK MEDICAL CARE IF:  Your edema is   not responding to recommended treatments. SEEK IMMEDIATE MEDICAL CARE IF:   You develop shortness of breath or chest pain.  You cannot breathe when you lay down; or if, while lying down, you have to get up and go to the window to get your breath.  You are having increasing  swelling without relief from treatment.  You develop a fever over 102 F (38.9 C).  You develop pain or redness in the areas that are swollen.  Tell your caregiver right away if you have gained 3 lb/1.4 kg in 1 day or 5 lb/2.3 kg in a week. MAKE SURE YOU:   Understand these instructions.  Will watch your condition.  Will get help right away if you are not doing well or get worse. Document Released: 09/25/2005 Document Revised: 03/26/2012 Document Reviewed: 05/13/2008 [employer] Patient Information 2013 [employer].  

## 2012-10-08 ENCOUNTER — Ambulatory Visit: Payer: PRIVATE HEALTH INSURANCE | Attending: Physical Medicine & Rehabilitation | Admitting: Physical Therapy

## 2012-10-08 DIAGNOSIS — IMO0001 Reserved for inherently not codable concepts without codable children: Secondary | ICD-10-CM | POA: Insufficient documentation

## 2012-10-08 DIAGNOSIS — M546 Pain in thoracic spine: Secondary | ICD-10-CM | POA: Insufficient documentation

## 2012-10-08 DIAGNOSIS — R262 Difficulty in walking, not elsewhere classified: Secondary | ICD-10-CM | POA: Insufficient documentation

## 2012-10-14 ENCOUNTER — Ambulatory Visit: Payer: PRIVATE HEALTH INSURANCE | Attending: Physical Medicine & Rehabilitation | Admitting: Physical Therapy

## 2012-10-14 DIAGNOSIS — M546 Pain in thoracic spine: Secondary | ICD-10-CM | POA: Insufficient documentation

## 2012-10-14 DIAGNOSIS — R262 Difficulty in walking, not elsewhere classified: Secondary | ICD-10-CM | POA: Insufficient documentation

## 2012-10-14 DIAGNOSIS — IMO0001 Reserved for inherently not codable concepts without codable children: Secondary | ICD-10-CM | POA: Insufficient documentation

## 2012-10-17 ENCOUNTER — Ambulatory Visit: Payer: PRIVATE HEALTH INSURANCE | Admitting: Physical Therapy

## 2012-10-18 ENCOUNTER — Telehealth: Payer: Self-pay | Admitting: *Deleted

## 2012-10-18 NOTE — Telephone Encounter (Signed)
Patient called stating she is having increased pain. She is taking Tramadol 50 mg 2 tabs every 8 hours prn pain. She would like to know if this can be increased. Advised patient to make an appointment to discuss this with the Dr. In the office.

## 2012-10-21 ENCOUNTER — Ambulatory Visit: Payer: PRIVATE HEALTH INSURANCE | Admitting: Physical Therapy

## 2012-10-22 ENCOUNTER — Ambulatory Visit (HOSPITAL_BASED_OUTPATIENT_CLINIC_OR_DEPARTMENT_OTHER): Payer: PRIVATE HEALTH INSURANCE | Admitting: Physical Medicine & Rehabilitation

## 2012-10-22 ENCOUNTER — Encounter: Payer: Self-pay | Admitting: Physical Medicine & Rehabilitation

## 2012-10-22 ENCOUNTER — Encounter: Payer: PRIVATE HEALTH INSURANCE | Attending: Physical Medicine & Rehabilitation

## 2012-10-22 VITALS — BP 110/63 | HR 73 | Resp 14 | Ht 63.0 in | Wt 175.0 lb

## 2012-10-22 DIAGNOSIS — M48061 Spinal stenosis, lumbar region without neurogenic claudication: Secondary | ICD-10-CM | POA: Insufficient documentation

## 2012-10-22 DIAGNOSIS — IMO0002 Reserved for concepts with insufficient information to code with codable children: Secondary | ICD-10-CM

## 2012-10-22 DIAGNOSIS — M5416 Radiculopathy, lumbar region: Secondary | ICD-10-CM

## 2012-10-22 DIAGNOSIS — M79609 Pain in unspecified limb: Secondary | ICD-10-CM | POA: Insufficient documentation

## 2012-10-22 NOTE — Progress Notes (Signed)
Subjective:    Patient ID: Theresa Cline, female    DOB: March 19, 1951, 62 y.o.   MRN: [id number]  HPI Back pain and knee pain, right greater than left,  Right greater than left hip pain Back pain has been getting worse over time  Knee pain getting worse with time has an appointment with orthopedics scheduled,Only partial relief with ultrasound-guided injections Has been to the emergency department for edema of the legs. Has received Lasix. Will followup with primary physician Pain Inventory Average Pain 10 Pain Right Now 9 My pain is sharp, dull, stabbing and aching  In the last 24 hours, has pain interfered with the following? General activity 10 Relation with others 10 Enjoyment of life 10 What TIME of day is your pain at its worst? all the time Sleep (in general) Poor  Pain is worse with: walking, bending and inactivity Pain improves with: rest Relief from Meds: 0  Mobility use a cane how many minutes can you walk? 10 ability to climb steps?  no do you drive?  no  Function disabled: date disabled 06/99 I need assistance with the following:  household duties and shopping  Neuro/Psych bladder control problems weakness numbness tremor tingling trouble walking spasms dizziness confusion anxiety loss of taste or smell  Prior Studies Any changes since last visit?  no  Physicians involved in your care Any changes since last visit?  no   Family History  Problem Relation Age of Onset  . Leukemia Mother     CLL  . Diabetes type II Mother   . Other Mother     Hypercholesterolemia  . Osteoarthritis Mother   . COPD Mother   . Hypertension Mother   . Hodgkin's lymphoma Father   . Diabetes type II Sister   . Osteoporosis Sister   . Paranoid behavior Brother     Unsure if diagnosed with Schizophrenia  . Diabetes type II Sister   . Hypertension Sister   . Diabetes type II Sister   . Hypertension Sister   . Schizophrenia Sister   . Heart disease Sister      Enlarged heart  . Glaucoma Sister   . Alcohol abuse Sister   . Glaucoma Sister   . Alcohol abuse Sister   . Bipolar disorder Daughter   . Lupus Daughter     SLE   History   Social History  . Marital Status: Divorced    Spouse Name: N/A    Number of Children: N/A  . Years of Education: N/A   Occupational History  . 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 Smoking status: Current Every Day Smoker -- 1.0 packs/day for 40 years    Types: Cigarettes  . Smokeless tobacco: Never Used  . Alcohol Use: None  . Drug Use: None  . Sexually Active: None   Other Topics Concern  . None   Social History Narrative   DivorcedDisability mainly for back and chronic intermittent vertigo - since tumor removed - no surgical correction for backLives alone, keeps her grandson, 32 yr old daughter is a mother   Past Surgical History  Procedure Date  . Resection of left acoustic neuroma 1999  . Coronary angioplasty with stent placement 2004    Normal LV function  . Total abdominal hysterectomy w/ bilateral salpingoophorectomy 04/12/1988    For fibroid tumors; [place], [place]  . Carpal tunnel release 08/09/1998  Right  . Carpal tunnel release 09/08/1998    Left  . Removal lipoma     Right foot  . Cesarean section     x3   Past Medical History  Diagnosis Date  . S/P carpal tunnel release   . Vertigo   . Tobacco abuse   . Encounter for long-term (current) use of other medications   . Osteoporosis   . Left acoustic neuroma     Hx of  . Diabetic peripheral neuropathy   . Mixed hyperlipidemia   . Primary osteoarthritis of both knees   . Chronic pain syndrome   . Obesity   . Hypertension   . CAD (coronary artery disease)   . Diabetes mellitus type II, controlled   . Bronchitis, acute   . Diabetes type 2, controlled   . Cancer     Renal s/p nephrectomy   BP 110/63  Pulse 73  Resp 14  Ht 5\' 3"  (1.6 m)  Wt 175 lb (79.379 kg)   BMI 31.00 kg/m2  SpO2 97%     Review of Systems  Constitutional: Positive for appetite change and unexpected weight change.  Cardiovascular: Positive for leg swelling.  Gastrointestinal: Positive for nausea.  Musculoskeletal: Positive for back pain and gait problem.  Neurological: Positive for dizziness, tremors, weakness and numbness.  Psychiatric/Behavioral: Positive for confusion. The patient is nervous/anxious.   All other systems reviewed and are negative.       Objective:   Physical Exam  Nursing note and vitals reviewed. Constitutional: She is oriented to person, place, and time. She appears well-developed and well-nourished.  HENT:  Head: Normocephalic and atraumatic.  Eyes: Conjunctivae normal and EOM are normal. Pupils are equal, round, and reactive to light.  Neurological: She is alert and oriented to person, place, and time. She has normal strength and normal reflexes.  Psychiatric: She has a normal mood and affect.   Tenderness to palpation over the greater trochanter of the hip bilateral Knee has tenderness around the patella. Negative straight leg raising test       Assessment & Plan:  1. Trochanteric bursitis despite medication management and other conservative care will inject today 2. Lumbar spinal stenosis with increasing back pain. Last MRI was more than 3 years ago. Will repeat. 3. 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 Question whether back maybe radiating some pain.  She will followup with orthopedics because of history of knee effusion which may be due to a intra-articular problem such as meniscal tear.  Right trochanteric bursa injection Indication right trochanteric bursitis with pain only partially response to medication management and other conservative care. Pain interferes with sleep Patient placed in a left lateral decubitus position area marked and prepped with Betadine and alcohol then entered with a 25-gauge 1.5 inch needle  inserted to bone contact. 1 cc of 40 mg per cc Depo-Medrol and 4 cc of lidocaine were injected patient tolerated procedure well. Post procedure instructions given

## 2012-10-22 NOTE — Patient Instructions (Signed)
See Dr Ranell Patrick for your knee See me next month for L hip injection and followup MRI result Take tylenol with tramadol

## 2012-10-23 ENCOUNTER — Ambulatory Visit: Payer: PRIVATE HEALTH INSURANCE | Admitting: Physical Therapy

## 2012-10-25 ENCOUNTER — Other Ambulatory Visit: Payer: Self-pay | Admitting: Physical Medicine & Rehabilitation

## 2012-10-25 DIAGNOSIS — M48061 Spinal stenosis, lumbar region without neurogenic claudication: Secondary | ICD-10-CM

## 2012-10-28 ENCOUNTER — Ambulatory Visit: Payer: PRIVATE HEALTH INSURANCE | Admitting: Physical Therapy

## 2012-10-28 ENCOUNTER — Ambulatory Visit (HOSPITAL_COMMUNITY): Payer: PRIVATE HEALTH INSURANCE

## 2012-10-28 ENCOUNTER — Ambulatory Visit (HOSPITAL_COMMUNITY): Admission: RE | Admit: 2012-10-28 | Payer: PRIVATE HEALTH INSURANCE | Source: Ambulatory Visit

## 2012-10-30 ENCOUNTER — Ambulatory Visit: Payer: PRIVATE HEALTH INSURANCE | Admitting: Rehabilitation

## 2012-10-31 ENCOUNTER — Ambulatory Visit: Payer: PRIVATE HEALTH INSURANCE | Admitting: Cardiovascular Disease

## 2012-10-31 ENCOUNTER — Ambulatory Visit (HOSPITAL_COMMUNITY): Admission: RE | Admit: 2012-10-31 | Payer: PRIVATE HEALTH INSURANCE | Source: Ambulatory Visit

## 2012-11-01 ENCOUNTER — Telehealth: Payer: Self-pay | Admitting: Physical Medicine & Rehabilitation

## 2012-11-01 DIAGNOSIS — M4800 Spinal stenosis, site unspecified: Secondary | ICD-10-CM

## 2012-11-01 NOTE — Telephone Encounter (Signed)
Order put in for With and Without Contrast.

## 2012-11-01 NOTE — Telephone Encounter (Signed)
[facility] to do MRI on Saturday, but I need an order for with and without contrast. It has been auth'd through [employer]. Need today please

## 2012-11-02 ENCOUNTER — Ambulatory Visit
Admission: RE | Admit: 2012-11-02 | Discharge: 2012-11-02 | Disposition: A | Discharge status: Home / Self Care | Payer: PRIVATE HEALTH INSURANCE | Source: Ambulatory Visit | Attending: Physical Medicine & Rehabilitation | Admitting: Physical Medicine & Rehabilitation

## 2012-11-02 DIAGNOSIS — M5416 Radiculopathy, lumbar region: Secondary | ICD-10-CM

## 2012-11-02 MED ORDER — GADOBENATE DIMEGLUMINE 529 MG/ML IV SOLN
15.0000 mL | Freq: Once | INTRAVENOUS | Status: AC | PRN
  Administered 2012-11-02: 15 mL via INTRAVENOUS

## 2012-11-04 ENCOUNTER — Ambulatory Visit: Payer: PRIVATE HEALTH INSURANCE | Admitting: Physical Therapy

## 2012-11-06 ENCOUNTER — Ambulatory Visit: Payer: PRIVATE HEALTH INSURANCE | Admitting: Physical Therapy

## 2012-11-08 ENCOUNTER — Ambulatory Visit (HOSPITAL_BASED_OUTPATIENT_CLINIC_OR_DEPARTMENT_OTHER): Payer: PRIVATE HEALTH INSURANCE | Admitting: Physical Medicine & Rehabilitation

## 2012-11-08 ENCOUNTER — Encounter: Payer: Self-pay | Admitting: Physical Medicine & Rehabilitation

## 2012-11-08 VITALS — BP 137/84 | HR 101 | Resp 14 | Ht 63.0 in | Wt 169.6 lb

## 2012-11-08 DIAGNOSIS — M1710 Unilateral primary osteoarthritis, unspecified knee: Secondary | ICD-10-CM

## 2012-11-08 DIAGNOSIS — M48061 Spinal stenosis, lumbar region without neurogenic claudication: Secondary | ICD-10-CM

## 2012-11-08 DIAGNOSIS — IMO0002 Reserved for concepts with insufficient information to code with codable children: Secondary | ICD-10-CM

## 2012-11-08 MED ORDER — TRAMADOL HCL 50 MG PO TABS: 100.0000 mg | ORAL_TABLET | Freq: Three times a day (TID) | ORAL | Status: DC | PRN

## 2012-11-08 NOTE — Patient Instructions (Addendum)
Recommend knee surgery before considering any spine surgery If your back starts flaring up there are injections that I can do in this office Continue current medications

## 2012-11-08 NOTE — Progress Notes (Signed)
Subjective:    Theresa Cline ID: Theresa Cline, female    DOB: 02-03-1951, 62 y.o.   MRN: [id number]  HPI Had appointment with orthopedics. Bilateral knee injections done. These are feeling better. Recommended to have total knee replacements later this year. Lumbar spine MRI results reviewed with Theresa Cline. Primary has L4-L5 severe stenosis. Moderate stenosis at L3-L4 multifactorial due to facet arthropathy as well as disc bulging and ligament hypertrophy Pain Inventory Average Pain 0 Pain Right Now 0 My pain is no pain  In the last 24 hours, has pain interfered with the following? General activity 0 Relation with others 0 Enjoyment of life 0 What TIME of day is your pain at its worst? no pain Sleep (in general) Poor  Pain is worse with: unsure Pain improves with: no pain Relief from Meds: 0  Mobility use a cane how many minutes can you walk? 20-30 ability to climb steps?  no do you drive?  no  Function disabled: date disabled 1999 I need assistance with the following:  shopping  Neuro/Psych bladder control problems weakness spasms dizziness confusion anxiety loss of taste or smell  Prior Studies Any changes since last visit?  no  Physicians involved in your care Any changes since last visit?  no   Family History  Problem Relation Age of Onset  . Leukemia Mother     CLL  . Diabetes type II Mother   . Other Mother     Hypercholesterolemia  . Osteoarthritis Mother   . COPD Mother   . Hypertension Mother   . Hodgkin's lymphoma Father   . Diabetes type II Sister   . Osteoporosis Sister   . Paranoid behavior Brother     Unsure if diagnosed with Schizophrenia  . Diabetes type II Sister   . Hypertension Sister   . Diabetes type II Sister   . Hypertension Sister   . Schizophrenia Sister   . Heart disease Sister     Enlarged heart  . Glaucoma Sister   . Alcohol abuse Sister   . Glaucoma Sister   . Alcohol abuse Sister   . Bipolar disorder Daughter   .  Lupus Daughter     SLE   History   Social History  . Marital Status: Divorced    Spouse Name: N/A    Number of Children: N/A  . Years of Education: N/A   Occupational History  . 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 Smoking status: Current Every Day Smoker -- 1.0 packs/day for 40 years    Types: Cigarettes  . Smokeless tobacco: Never Used  . Alcohol Use: None  . Drug Use: None  . Sexually Active: None   Other Topics Concern  . None   Social History Narrative   DivorcedDisability mainly for back and chronic intermittent vertigo - since tumor removed - no surgical correction for backLives alone, keeps her grandson, 87 yr old daughter is a mother   Past Surgical History  Procedure Date  . Resection of left acoustic neuroma 1999  . Coronary angioplasty with stent placement 2004    Normal LV function  . Total abdominal hysterectomy w/ bilateral salpingoophorectomy 04/12/1988    For fibroid tumors; [place], [place]  . Carpal tunnel release 08/09/1998    Right  . Carpal tunnel release 09/08/1998    Left  . Removal lipoma     Right foot  . Cesarean section  x3   Past Medical History  Diagnosis Date  . S/P carpal tunnel release   . Vertigo   . Tobacco abuse   . Encounter for long-term (current) use of other medications   . Osteoporosis   . Left acoustic neuroma     Hx of  . Diabetic peripheral neuropathy   . Mixed hyperlipidemia   . Primary osteoarthritis of both knees   . Chronic pain syndrome   . Obesity   . Hypertension   . CAD (coronary artery disease)   . Diabetes mellitus type II, controlled   . Bronchitis, acute   . Diabetes type 2, controlled   . Cancer     Renal s/p nephrectomy   BP 137/84  Pulse 101  Resp 14  Ht 5\' 3"  (1.6 m)  Wt 169 lb 9.6 oz (76.93 kg)  BMI 30.04 kg/m2  SpO2 97%    Review of Systems  Musculoskeletal: Positive for back pain.  Neurological: Positive for dizziness and  weakness.       Spasms   Psychiatric/Behavioral: Positive for confusion. The Theresa Cline is nervous/anxious.   All other systems reviewed and are negative.       Objective:   Physical Exam  Nursing note and vitals reviewed. Constitutional: Theresa Cline is oriented to person, place, and time. Theresa Cline appears well-developed and well-nourished.  HENT:  Head: Normocephalic and atraumatic.  Eyes: Conjunctivae normal and EOM are normal. Pupils are equal, round, and reactive to light.  Neck: Normal range of motion. Neck supple.  Neurological: Theresa Cline is alert and oriented to person, place, and time.  Psychiatric: Theresa Cline has a normal mood and affect.    Knees have mild tenderness medial aspect mainly on the left side. Minimal swelling. Back has tenderness to palpation in the lumbar paraspinal muscles. Lumbar range of motion is normal. Lower tremor strength is normal. General no acute distress      Assessment & Plan:  1. Knee osteoarthritis will followup with orthopedic surgeon 2. Lumbar spinal stenosis. He can do transforaminal L4-5 injections in the future if Theresa Cline is getting more claudication symptoms. We discussed that Theresa Cline should get the knee surgery done before Theresa Cline decides on any type of spine surgery. We can continue current medications. I'll see the Theresa Cline back in about 3 months Finish physical therapy. Theresa Cline is getting relief with TENS unit

## 2012-11-12 ENCOUNTER — Other Ambulatory Visit (HOSPITAL_COMMUNITY): Payer: Self-pay | Admitting: Internal Medicine

## 2012-11-12 ENCOUNTER — Ambulatory Visit: Payer: PRIVATE HEALTH INSURANCE | Attending: Physical Medicine & Rehabilitation | Admitting: Physical Therapy

## 2012-11-12 DIAGNOSIS — R0602 Shortness of breath: Secondary | ICD-10-CM

## 2012-11-12 DIAGNOSIS — IMO0001 Reserved for inherently not codable concepts without codable children: Secondary | ICD-10-CM | POA: Insufficient documentation

## 2012-11-12 DIAGNOSIS — R262 Difficulty in walking, not elsewhere classified: Secondary | ICD-10-CM | POA: Insufficient documentation

## 2012-11-12 DIAGNOSIS — M546 Pain in thoracic spine: Secondary | ICD-10-CM | POA: Insufficient documentation

## 2012-11-13 ENCOUNTER — Other Ambulatory Visit (HOSPITAL_COMMUNITY): Payer: PRIVATE HEALTH INSURANCE

## 2012-11-14 ENCOUNTER — Ambulatory Visit: Payer: PRIVATE HEALTH INSURANCE | Admitting: Physical Therapy

## 2012-11-18 ENCOUNTER — Ambulatory Visit: Payer: PRIVATE HEALTH INSURANCE | Admitting: Physical Therapy

## 2012-11-18 ENCOUNTER — Ambulatory Visit (HOSPITAL_COMMUNITY): Payer: PRIVATE HEALTH INSURANCE | Attending: Cardiology

## 2012-11-18 DIAGNOSIS — R0989 Other specified symptoms and signs involving the circulatory and respiratory systems: Secondary | ICD-10-CM | POA: Insufficient documentation

## 2012-11-18 DIAGNOSIS — E785 Hyperlipidemia, unspecified: Secondary | ICD-10-CM | POA: Insufficient documentation

## 2012-11-18 DIAGNOSIS — R0602 Shortness of breath: Secondary | ICD-10-CM

## 2012-11-18 DIAGNOSIS — R609 Edema, unspecified: Secondary | ICD-10-CM | POA: Insufficient documentation

## 2012-11-18 DIAGNOSIS — R0609 Other forms of dyspnea: Secondary | ICD-10-CM | POA: Insufficient documentation

## 2012-11-18 DIAGNOSIS — I10 Essential (primary) hypertension: Secondary | ICD-10-CM | POA: Insufficient documentation

## 2012-11-18 DIAGNOSIS — E119 Type 2 diabetes mellitus without complications: Secondary | ICD-10-CM | POA: Insufficient documentation

## 2012-11-18 NOTE — Progress Notes (Signed)
Echocardiogram performed.  

## 2012-11-19 ENCOUNTER — Ambulatory Visit: Payer: PRIVATE HEALTH INSURANCE | Admitting: Physical Therapy

## 2012-11-19 ENCOUNTER — Encounter (HOSPITAL_COMMUNITY): Payer: Self-pay | Admitting: Internal Medicine

## 2012-11-22 ENCOUNTER — Ambulatory Visit: Payer: PRIVATE HEALTH INSURANCE | Admitting: Physical Medicine & Rehabilitation

## 2012-12-04 ENCOUNTER — Encounter: Payer: Self-pay | Admitting: Cardiovascular Disease

## 2012-12-04 ENCOUNTER — Ambulatory Visit (INDEPENDENT_AMBULATORY_CARE_PROVIDER_SITE_OTHER): Payer: PRIVATE HEALTH INSURANCE | Admitting: Cardiovascular Disease

## 2012-12-04 VITALS — BP 117/70 | HR 68 | Ht 63.0 in | Wt 175.0 lb

## 2012-12-04 DIAGNOSIS — I714 Abdominal aortic aneurysm, without rupture: Secondary | ICD-10-CM

## 2012-12-04 DIAGNOSIS — I779 Disorder of arteries and arterioles, unspecified: Secondary | ICD-10-CM

## 2012-12-04 MED ORDER — NITROGLYCERIN 0.4 MG/SPRAY TL SOLN: 1.0000 | Status: DC | PRN

## 2012-12-04 MED ORDER — FUROSEMIDE 20 MG PO TABS: 20.0000 mg | ORAL_TABLET | Freq: Every day | ORAL | Status: DC

## 2012-12-04 NOTE — Patient Instructions (Addendum)
Your physician wants you to follow-up in:  6 months.  You will receive a reminder letter in the mail two months in advance. If you don't receive a letter, please call our office to schedule the follow-up appointment.  Your physician has requested that you have an abdominal aorta duplex. During this test, an ultrasound is used to evaluate the aorta. Allow 30 minutes for this exam. Do not eat after midnight the day before and avoid carbonated beverages   Your physician has requested that you have a carotid duplex. This test is an ultrasound of the carotid arteries in your neck. It looks at blood flow through these arteries that supply the brain with blood. Allow one hour for this exam. There are no restrictions or special instructions.   Your physician has recommended you make the following change in your medication: Stop Norvasc.  Start Furosemide 20 mg by mouth daily

## 2012-12-04 NOTE — Progress Notes (Signed)
History of Present Illness: 62 yo AAF with h/o CAD s/p MI and stents x 3 in 2004, HTN, Hyperlipidemia, DM type II who presents today for a routine visit. Her stress myoview in 2010 showed no ischemia. She is known to have moderate carotid artery disease. Echo October 2010 showed normal LV size and function without any significant valvular problems. 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 I saw her in October 2012 after she had a syncopal event. She told me that she was sitting on the toilet and when she stood up, she passed out. This was following a bowel movement. She felt lightheaded when standing and passed out. No recurrences after that. Her carotid arteries were looked at in primary care and dopplers per patient were unchanged. She was seen in the ED at [facility] 07/02/11 for LE edema. She was given Lasix for 10 days and this resolved.   She is here today for follow up. She is doing well overall. She has no chest pain or SOB. Her only complaint is of lower extremity edema. She was seen in the ED 10/05/12 for LE edema and was given Lasix 20 mg po Qdaily for 7 days and this helped with her swelling. Since she stopped the Lasix, she has had mild daily swelling that resolves at night.   Primary Care Physician: Nicholos Johns  Last Lipid Profile:Lipid Panel     Component Value Date/Time   CHOL 127 02/01/2009 2152   TRIG 107 02/01/2009 2152   HDL 42 02/01/2009 2152   CHOLHDL 3.0 Ratio 02/01/2009 2152   VLDL 21 02/01/2009 2152   LDLCALC 64 02/01/2009 2152     Past Medical History  Diagnosis Date  . S/P carpal tunnel release   . Vertigo   . Tobacco abuse   . Encounter for long-term (current) use of other medications   . Osteoporosis   . Left acoustic neuroma     Hx of  . Diabetic peripheral neuropathy   . Mixed hyperlipidemia   . Primary osteoarthritis of both knees   . Chronic pain syndrome   . Obesity   . Hypertension   . CAD  (coronary artery disease)   . Diabetes mellitus type II, controlled   . Bronchitis, acute   . Diabetes type 2, controlled   . Cancer     Renal s/p nephrectomy    Past Surgical History  Procedure Laterality Date  . Resection of left acoustic neuroma  1999  . Coronary angioplasty with stent placement  2004    Normal LV function  . Total abdominal hysterectomy w/ bilateral salpingoophorectomy  04/12/1988    For fibroid tumors; [place], [place]  . Carpal tunnel release  08/09/1998    Right  . Carpal tunnel release  09/08/1998    Left  . Removal lipoma      Right foot  . Cesarean section      x3    Current Outpatient Prescriptions  Medication Sig Dispense Refill  . alendronate (FOSAMAX) 70 MG tablet Take 70 mg by mouth every 7 (seven) days. Take with a full glass of water on an empty stomach.       Marland Kitchen amLODipine (NORVASC) 5 MG tablet Take 5 mg by mouth daily.      Marland Kitchen aspirin 81 MG tablet Take 81 mg by mouth daily.      Marland Kitchen atorvastatin (LIPITOR) 40 MG tablet Take 40 mg by mouth daily.       Marland Kitchen  Cholecalciferol (VITAMIN D3) 2000 UNITS TABS Take 1 tablet by mouth daily.        . citalopram (CELEXA) 40 MG tablet Take 40 mg by mouth daily. **Needs office visit.**       . cyclobenzaprine (FLEXERIL) 5 MG tablet Take 10 mg by mouth 3 (three) times daily as needed.      . fish oil-omega-3 fatty acids 1000 MG capsule Take 1 g by mouth daily.        . metFORMIN (GLUCOPHAGE) 1000 MG tablet Take 1,000 mg by mouth 2 (two) times daily with a meal.        . omeprazole (PRILOSEC) 40 MG capsule Take 40 mg by mouth daily.        . traMADol (ULTRAM) 50 MG tablet Take 2 tablets (100 mg total) by mouth every 8 (eight) hours as needed for pain.  180 tablet  2  . traMADol-acetaminophen (ULTRACET) 37.5-325 MG per tablet       . valsartan-hydrochlorothiazide (DIOVAN-HCT) 160-12.5 MG per tablet Take 1 tablet by mouth 2 (two) times daily.        . vitamin E 400 UNIT capsule Take 400 Units by mouth daily.      .  metoprolol (TOPROL-XL) 100 MG 24 hr tablet Take 100 mg by mouth daily.         No current facility-administered medications for this visit.    Allergies  Allergen Reactions  . Adhesive (Tape) Rash    History   Social History  . Marital Status: Divorced    Spouse Name: N/A    Number of Children: N/A  . Years of Education: N/A   Occupational History  . 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 Smoking status: Current Every Day Smoker -- 1.00 packs/day for 40 years    Types: Cigarettes  . Smokeless tobacco: Never Used  . Alcohol Use: Not on file  . Drug Use: Not on file  . Sexually Active: Not on file   Other Topics Concern  . Not on file   Social History Narrative   Divorced   Disability mainly for back and chronic intermittent vertigo - since tumor removed - no surgical correction for back   Lives alone, keeps her grandson, 59 yr old daughter is a mother    Family History  Problem Relation Age of Onset  . Leukemia Mother     CLL  . Diabetes type II Mother   . Other Mother     Hypercholesterolemia  . Osteoarthritis Mother   . COPD Mother   . Hypertension Mother   . Hodgkin's lymphoma Father   . Diabetes type II Sister   . Osteoporosis Sister   . Paranoid behavior Brother     Unsure if diagnosed with Schizophrenia  . Diabetes type II Sister   . Hypertension Sister   . Diabetes type II Sister   . Hypertension Sister   . Schizophrenia Sister   . Heart disease Sister     Enlarged heart  . Glaucoma Sister   . Alcohol abuse Sister   . Glaucoma Sister   . Alcohol abuse Sister   . Bipolar disorder Daughter   . Lupus Daughter     SLE    Review of Systems:  As stated in the HPI and otherwise negative.   BP 117/70  Pulse 68  Ht 5\' 3"  (1.6 m)  Wt 175 lb (  79.379 kg)  BMI 31.01 kg/m2  Physical Examination: General: Well developed, well nourished, NAD HEENT: OP clear, mucus membranes moist SKIN: warm,  dry. No rashes. Neuro: No focal deficits Musculoskeletal: Muscle strength 5/5 all ext Psychiatric: Mood and affect normal Neck: No JVD, no carotid bruits, no thyromegaly, no lymphadenopathy. Lungs:Clear bilaterally, no wheezes, rhonci, crackles Cardiovascular: Regular rate and rhythm. No murmurs, gallops or rubs. Abdomen:Soft. Bowel sounds present. Non-tender.  Extremities: No lower extremity edema. Pulses are 2 + in the bilateral DP/PT.  EKG: NSR, rate 68 bpm. Old septal infarct.   Echo 11/18/12:  Left ventricle: The cavity size was normal. Wall thickness was normal. Systolic function was normal. The estimated ejection fraction was in the range of 55% to 60%. Wall motion was normal; there were no regional wall motion abnormalities. Doppler parameters are consistent with abnormal left ventricular relaxation (grade 1 diastolic dysfunction). - Atrial septum: There was redundancy of the septum, with borderline criteria for aneurysm. - Pulmonary arteries: Systolic pressure was mildly increased. PA peak pressure: 43mm Hg (S).  Assessment and Plan:   1. CAD: Stable. Continue current therapy. No changes. Lipids are followed in primary care.   2. Carotid artery disease: Known to have moderate carotid artery disease. Will arrange f/u dopplers.   3. AAA: Known history of AAA. Will arrange f/u abdominal aortic u/s.   4. Lower extremity edema: Will hold Norvasc and will start Lasix 20 mg po Qdaily. She needs a BMET in one week. She is going to have routine blood work in primary care in 1 week. Will ask her primary care doctor to check a BMET with routine blood work.

## 2012-12-06 ENCOUNTER — Encounter: Payer: PRIVATE HEALTH INSURANCE | Attending: Physical Medicine & Rehabilitation

## 2012-12-06 ENCOUNTER — Ambulatory Visit (HOSPITAL_BASED_OUTPATIENT_CLINIC_OR_DEPARTMENT_OTHER): Payer: PRIVATE HEALTH INSURANCE | Admitting: Physical Medicine & Rehabilitation

## 2012-12-06 ENCOUNTER — Encounter: Payer: Self-pay | Admitting: Physical Medicine & Rehabilitation

## 2012-12-06 VITALS — BP 116/70 | HR 72 | Resp 14 | Ht 63.0 in | Wt 174.0 lb

## 2012-12-06 DIAGNOSIS — M7062 Trochanteric bursitis, left hip: Secondary | ICD-10-CM

## 2012-12-06 DIAGNOSIS — M48061 Spinal stenosis, lumbar region without neurogenic claudication: Secondary | ICD-10-CM | POA: Insufficient documentation

## 2012-12-06 DIAGNOSIS — M76899 Other specified enthesopathies of unspecified lower limb, excluding foot: Secondary | ICD-10-CM

## 2012-12-06 DIAGNOSIS — M79609 Pain in unspecified limb: Secondary | ICD-10-CM | POA: Insufficient documentation

## 2012-12-06 DIAGNOSIS — IMO0002 Reserved for concepts with insufficient information to code with codable children: Secondary | ICD-10-CM | POA: Insufficient documentation

## 2012-12-06 NOTE — Progress Notes (Signed)
Subjective:    Patient ID: Theresa Cline, female    DOB: 1951-01-05, 62 y.o.   MRN: [id number]  HPI Recently had echocardiogram. Ejection fraction 5560%. Mild diastolic dysfunction. Knees are still doing okay after injection by orthopedics. Her back still bothers her but no evidence of radiculopathy Pain Inventory Average Pain 2 Pain Right Now 5 My pain is sharp, stabbing and aching  In the last 24 hours, has pain interfered with the following? General activity 6 Relation with others 6 Enjoyment of life 4 What TIME of day is your pain at its worst? all the time Sleep (in general) Fair  Pain is worse with: walking and bending Pain improves with: rest Relief from Meds: 2  Mobility use a cane ability to climb steps?  no do you drive?  no  Function Do you have any goals in this area?  no  Neuro/Psych tremor tingling trouble walking dizziness confusion anxiety loss of taste or smell  Prior Studies Any changes since last visit?  no  Physicians involved in your care Any changes since last visit?  no   Family History  Problem Relation Age of Onset  . Leukemia Mother     CLL  . Diabetes type II Mother   . Other Mother     Hypercholesterolemia  . Osteoarthritis Mother   . COPD Mother   . Hypertension Mother   . Hodgkin's lymphoma Father   . Diabetes type II Sister   . Osteoporosis Sister   . Paranoid behavior Brother     Unsure if diagnosed with Schizophrenia  . Diabetes type II Sister   . Hypertension Sister   . Diabetes type II Sister   . Hypertension Sister   . Schizophrenia Sister   . Heart disease Sister     Enlarged heart  . Glaucoma Sister   . Alcohol abuse Sister   . Glaucoma Sister   . Alcohol abuse Sister   . Bipolar disorder Daughter   . Lupus Daughter     SLE   History   Social History  . Marital Status: Divorced    Spouse Name: N/A    Number of Children: N/A  . Years of Education: N/A   Occupational History  . 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 Smoking status: Current Every Day Smoker -- 1.00 packs/day for 40 years    Types: Cigarettes  . Smokeless tobacco: Never Used  . Alcohol Use: None  . Drug Use: None  . Sexually Active: None   Other Topics Concern  . None   Social History Narrative   Divorced   Disability mainly for back and chronic intermittent vertigo - since tumor removed - no surgical correction for back   Lives alone, keeps her grandson, 72 yr old daughter is a mother   Past Surgical History  Procedure Laterality Date  . Resection of left acoustic neuroma  1999  . Coronary angioplasty with stent placement  2004    Normal LV function  . Total abdominal hysterectomy w/ bilateral salpingoophorectomy  04/12/1988    For fibroid tumors; [place], [place]  . Carpal tunnel release  08/09/1998    Right  . Carpal tunnel release  09/08/1998    Left  . Removal lipoma      Right foot  . Cesarean section      x3   Past Medical History  Diagnosis Date  .  S/P carpal tunnel release   . Vertigo   . Tobacco abuse   . Encounter for long-term (current) use of other medications   . Osteoporosis   . Left acoustic neuroma     Hx of  . Diabetic peripheral neuropathy   . Mixed hyperlipidemia   . Primary osteoarthritis of both knees   . Chronic pain syndrome   . Obesity   . Hypertension   . CAD (coronary artery disease)   . Diabetes mellitus type II, controlled   . Bronchitis, acute   . Diabetes type 2, controlled   . Cancer     Renal s/p nephrectomy   BP 116/70  Pulse 72  Resp 14  Ht 5\' 3"  (1.6 m)  Wt 174 lb (78.926 kg)  BMI 30.83 kg/m2  SpO2 94%     Review of Systems  Constitutional: Positive for diaphoresis and appetite change.  Cardiovascular: Positive for leg swelling.  Gastrointestinal: Positive for nausea.  Musculoskeletal: Positive for back pain and gait problem.  Neurological: Positive for dizziness and tremors.   Psychiatric/Behavioral: The patient is nervous/anxious.   All other systems reviewed and are negative.       Objective:   Physical Exam  Nursing note and vitals reviewed. Constitutional: She appears well-developed and well-nourished.  HENT:  Head: Normocephalic and atraumatic.  Eyes: EOM are normal. Pupils are equal, round, and reactive to light.  Musculoskeletal:       Left hip: She exhibits bony tenderness. She exhibits normal range of motion and normal strength.       Lumbar back: She exhibits tenderness and pain. She exhibits normal range of motion, no deformity and no spasm.  And tenderness over left greater trochanter  Lumbar paraspinal tenderness to palpation in the lower lumbar and sacral area  Psychiatric: She has a normal mood and affect.          Assessment & Plan:  1. Lumbar spinal stenosis no evidence of neurogenic claudication 2. End-stage osteoarthritis of the knees 3. Left trochanteric bursitis with pain only partially responsive to medication management and other conservative care,Will inject today 4. 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 Continue tramadol  Left greater trochanter bursa injection Informed consent was obtained after describing risks and benefits of the procedure with the patient these include bleeding bruising and infection she elects to proceed and has given written consent Patient pace in a right lateral decubitus position area marked and prepped with Betadine alcohol entered with a 21-gauge 2 inch needle. Needle inserted to bone contact and slightly withdrawn. Then a solution containing 1 cc of 40 November cc Depo-Medrol and 4 cc 1% lidocaine were injected. Patient tolerated procedure well. Post procedure instructions given

## 2012-12-06 NOTE — Patient Instructions (Signed)
We did a left greater trochanteric bursa injection today 40 mg of Depo-Medrol and 4 cc of 1% lidocaine. See me in 3 months Call me if he develops shooting pains down her legs, we may need to do an epidural injection C. Your orthopedic surgeon in regards to your knees

## 2012-12-11 ENCOUNTER — Encounter (INDEPENDENT_AMBULATORY_CARE_PROVIDER_SITE_OTHER): Payer: PRIVATE HEALTH INSURANCE

## 2012-12-11 DIAGNOSIS — I714 Abdominal aortic aneurysm, without rupture: Secondary | ICD-10-CM

## 2012-12-17 ENCOUNTER — Encounter (INDEPENDENT_AMBULATORY_CARE_PROVIDER_SITE_OTHER): Payer: PRIVATE HEALTH INSURANCE

## 2012-12-17 DIAGNOSIS — I6529 Occlusion and stenosis of unspecified carotid artery: Secondary | ICD-10-CM

## 2013-01-05 ENCOUNTER — Other Ambulatory Visit: Payer: Self-pay | Admitting: Cardiovascular Disease

## 2013-01-07 ENCOUNTER — Ambulatory Visit (INDEPENDENT_AMBULATORY_CARE_PROVIDER_SITE_OTHER): Payer: PRIVATE HEALTH INSURANCE | Admitting: Radiology

## 2013-01-07 ENCOUNTER — Ambulatory Visit (INDEPENDENT_AMBULATORY_CARE_PROVIDER_SITE_OTHER): Payer: PRIVATE HEALTH INSURANCE | Admitting: Neurology

## 2013-01-07 DIAGNOSIS — R200 Anesthesia of skin: Secondary | ICD-10-CM

## 2013-01-07 DIAGNOSIS — R209 Unspecified disturbances of skin sensation: Secondary | ICD-10-CM

## 2013-01-07 DIAGNOSIS — M79609 Pain in unspecified limb: Secondary | ICD-10-CM

## 2013-01-07 DIAGNOSIS — Z0289 Encounter for other administrative examinations: Secondary | ICD-10-CM

## 2013-01-07 NOTE — Procedures (Signed)
History: 62 years old female, referred by her primary care physician Dr.Ramachandran for evaluation of bilateral feet paresthesia, chronic low back pain for 2 years.  On exam, she has healed right dorsum ankle scar. Bilateral ankle dorsi and plantar flexion strengths was normal, she has trace bilateral Achilles reflexes,  Nerve conduction study:  Bilateral sural sensory responses were normal. Bilateral peroneal to EDB, and tibial motor responses were normal.  bilateral tibial  H. reflexes were normal and symmetric.  Electromyography: Selected needle examination was performed at right lower extremity muscles, and right lumbosacral paraspinal muscles.  The needle examination of right tibialis anterior, tibialis posterior, medial gastrocnemius, vastus lateralis, biceps femoris short head was normal.   There was no spontaneous activity at right lumbosacral paraspinal muscles, right L4, L5, S1,.  In conclusion: This is a normal study. There is no electrodiagnostic evidence of large fiber peripheral neuropathy, or right lumbosacral radiculopathy

## 2013-02-03 ENCOUNTER — Ambulatory Visit: Payer: PRIVATE HEALTH INSURANCE | Admitting: Physical Medicine & Rehabilitation

## 2013-02-03 ENCOUNTER — Ambulatory Visit (HOSPITAL_BASED_OUTPATIENT_CLINIC_OR_DEPARTMENT_OTHER): Payer: PRIVATE HEALTH INSURANCE | Admitting: Physical Medicine & Rehabilitation

## 2013-02-03 ENCOUNTER — Encounter: Payer: PRIVATE HEALTH INSURANCE | Attending: Physical Medicine & Rehabilitation

## 2013-02-03 ENCOUNTER — Encounter: Payer: Self-pay | Admitting: Physical Medicine & Rehabilitation

## 2013-02-03 VITALS — BP 125/60 | HR 70 | Resp 14 | Ht 63.0 in | Wt 176.0 lb

## 2013-02-03 DIAGNOSIS — M48061 Spinal stenosis, lumbar region without neurogenic claudication: Secondary | ICD-10-CM | POA: Insufficient documentation

## 2013-02-03 DIAGNOSIS — M1710 Unilateral primary osteoarthritis, unspecified knee: Secondary | ICD-10-CM

## 2013-02-03 DIAGNOSIS — IMO0002 Reserved for concepts with insufficient information to code with codable children: Secondary | ICD-10-CM | POA: Insufficient documentation

## 2013-02-03 DIAGNOSIS — M79609 Pain in unspecified limb: Secondary | ICD-10-CM | POA: Insufficient documentation

## 2013-02-03 MED ORDER — DICLOFENAC SODIUM 1 % TD GEL: 2.0000 g | Freq: Four times a day (QID) | TRANSDERMAL | Status: DC

## 2013-02-03 MED ORDER — TRAMADOL HCL 50 MG PO TABS: 100.0000 mg | ORAL_TABLET | Freq: Three times a day (TID) | ORAL | Status: DC | PRN

## 2013-02-03 NOTE — Patient Instructions (Addendum)
Call Dr. Ranell Patrick regarding your knee surgery He will prescribe pain medicine after the surgery

## 2013-02-03 NOTE — Progress Notes (Signed)
Subjective:    Patient ID: ANTOINE Cline, female    DOB: 07-28-1951, 62 y.o.   MRN: [id number]  HPI Recurrence of knee pain Left knee popped the other day, no falls Patient plans to get total knee replacement done Her back still bothers her but no evidence of radiculopathy  Pain Inventory Average Pain 9 Pain Right Now 9 My pain is sharp, burning and aching  In the last 24 hours, has pain interfered with the following? General activity 9 Relation with others 10 Enjoyment of life 10 What TIME of day is your pain at its worst? all the time Sleep (in general) Fair  Pain is worse with: walking and sitting Pain improves with: therapy/exercise Relief from Meds: 4  Mobility use a cane how many minutes can you walk? 20-30 ability to climb steps?  no do you drive?  no  Function retired  Neuro/Psych bowel control problems spasms dizziness anxiety loss of taste or smell  Prior Studies Any changes since last visit?  no  Physicians involved in your care Any changes since last visit?  no   Family History  Problem Relation Age of Onset  . Leukemia Mother     CLL  . Diabetes type II Mother   . Other Mother     Hypercholesterolemia  . Osteoarthritis Mother   . COPD Mother   . Hypertension Mother   . Hodgkin's lymphoma Father   . Diabetes type II Sister   . Osteoporosis Sister   . Paranoid behavior Brother     Unsure if diagnosed with Schizophrenia  . Diabetes type II Sister   . Hypertension Sister   . Diabetes type II Sister   . Hypertension Sister   . Schizophrenia Sister   . Heart disease Sister     Enlarged heart  . Glaucoma Sister   . Alcohol abuse Sister   . Glaucoma Sister   . Alcohol abuse Sister   . Bipolar disorder Daughter   . Lupus Daughter     SLE   History   Social History  . Marital Status: Divorced    Spouse Name: N/A    Number of Children: N/A  . Years of Education: N/A   Occupational History  . 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 Smoking status: Current Every Day Smoker -- 1.00 packs/day for 40 years    Types: Cigarettes  . Smokeless tobacco: Never Used  . Alcohol Use: None  . Drug Use: None  . Sexually Active: None   Other Topics Concern  . None   Social History Narrative   Divorced   Disability mainly for back and chronic intermittent vertigo - since tumor removed - no surgical correction for back   Lives alone, keeps her grandson, 1 yr old daughter is a mother   Past Surgical History  Procedure Laterality Date  . Resection of left acoustic neuroma  1999  . Coronary angioplasty with stent placement  2004    Normal LV function  . Total abdominal hysterectomy w/ bilateral salpingoophorectomy  04/12/1988    For fibroid tumors; [place], [place]  . Carpal tunnel release  08/09/1998    Right  . Carpal tunnel release  09/08/1998    Left  . Removal lipoma      Right foot  . Cesarean section      x3   Past Medical History  Diagnosis Date  . S/P  carpal tunnel release   . Vertigo   . Tobacco abuse   . Encounter for long-term (current) use of other medications   . Osteoporosis   . Left acoustic neuroma     Hx of  . Diabetic peripheral neuropathy   . Mixed hyperlipidemia   . Primary osteoarthritis of both knees   . Chronic pain syndrome   . Obesity   . Hypertension   . CAD (coronary artery disease)   . Diabetes mellitus type II, controlled   . Bronchitis, acute   . Diabetes type 2, controlled   . Cancer     Renal s/p nephrectomy   BP 125/60  Pulse 70  Resp 14  Ht 5\' 3"  (1.6 m)  Wt 176 lb (79.833 kg)  BMI 31.18 kg/m2  SpO2 96%     Review of Systems  Constitutional: Positive for diaphoresis and appetite change.  Neurological: Positive for dizziness.  Psychiatric/Behavioral: The patient is nervous/anxious.   All other systems reviewed and are negative.       Objective:   Physical Exam  Bilateral knees have limited  flexion. Full extension. Mild effusion bilaterally Back has tenderness palpation lumbar and thoracic paraspinals as well as over the gluteus muscles as well as the greater trochanters. Motor strength is 5/5 in bilateral lower extremities in hip flexors knee extensors ankle dorsi flexion plantar flexor Gait no evidence of toe drag or knee stability      Assessment & Plan:  1. Lumbar spinal stenosis no evidence of neurogenic claudication  2. End-stage osteoarthritis of the knees  3. Left trochanteric bursitis with pain only partially responsive to medication management and other conservative care,Will inject today  4. 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

## 2013-02-24 ENCOUNTER — Other Ambulatory Visit (HOSPITAL_COMMUNITY): Payer: Self-pay | Admitting: Internal Medicine

## 2013-02-24 DIAGNOSIS — Z1231 Encounter for screening mammogram for malignant neoplasm of breast: Secondary | ICD-10-CM

## 2013-02-28 ENCOUNTER — Ambulatory Visit: Payer: PRIVATE HEALTH INSURANCE | Admitting: Physical Medicine & Rehabilitation

## 2013-03-05 ENCOUNTER — Ambulatory Visit (HOSPITAL_COMMUNITY)
Admission: RE | Admit: 2013-03-05 | Discharge: 2013-03-05 | Disposition: A | Discharge status: Home / Self Care | Payer: PRIVATE HEALTH INSURANCE | Source: Ambulatory Visit | Attending: Internal Medicine | Admitting: Internal Medicine

## 2013-03-05 DIAGNOSIS — Z1231 Encounter for screening mammogram for malignant neoplasm of breast: Secondary | ICD-10-CM | POA: Insufficient documentation

## 2013-03-07 ENCOUNTER — Ambulatory Visit (HOSPITAL_COMMUNITY)
Admission: RE | Admit: 2013-03-07 | Discharge: 2013-03-07 | Disposition: A | Discharge status: Home / Self Care | Payer: PRIVATE HEALTH INSURANCE | Source: Ambulatory Visit | Attending: Urology | Admitting: Urology

## 2013-03-07 ENCOUNTER — Other Ambulatory Visit (HOSPITAL_COMMUNITY): Payer: Self-pay | Admitting: Urology

## 2013-03-07 DIAGNOSIS — C649 Malignant neoplasm of unspecified kidney, except renal pelvis: Secondary | ICD-10-CM

## 2013-03-07 DIAGNOSIS — I10 Essential (primary) hypertension: Secondary | ICD-10-CM | POA: Insufficient documentation

## 2013-03-07 DIAGNOSIS — F17200 Nicotine dependence, unspecified, uncomplicated: Secondary | ICD-10-CM | POA: Insufficient documentation

## 2013-06-05 ENCOUNTER — Encounter: Payer: PRIVATE HEALTH INSURANCE | Attending: Physical Medicine & Rehabilitation

## 2013-06-05 ENCOUNTER — Encounter: Payer: Self-pay | Admitting: Physical Medicine & Rehabilitation

## 2013-06-05 ENCOUNTER — Ambulatory Visit (HOSPITAL_BASED_OUTPATIENT_CLINIC_OR_DEPARTMENT_OTHER): Payer: PRIVATE HEALTH INSURANCE | Admitting: Physical Medicine & Rehabilitation

## 2013-06-05 VITALS — BP 112/67 | HR 66 | Resp 16 | Ht 63.0 in | Wt 154.0 lb

## 2013-06-05 DIAGNOSIS — Z5181 Encounter for therapeutic drug level monitoring: Secondary | ICD-10-CM

## 2013-06-05 DIAGNOSIS — M48062 Spinal stenosis, lumbar region with neurogenic claudication: Secondary | ICD-10-CM | POA: Insufficient documentation

## 2013-06-05 DIAGNOSIS — M7120 Synovial cyst of popliteal space [Baker], unspecified knee: Secondary | ICD-10-CM | POA: Insufficient documentation

## 2013-06-05 DIAGNOSIS — M1710 Unilateral primary osteoarthritis, unspecified knee: Secondary | ICD-10-CM | POA: Insufficient documentation

## 2013-06-05 DIAGNOSIS — G894 Chronic pain syndrome: Secondary | ICD-10-CM | POA: Insufficient documentation

## 2013-06-05 DIAGNOSIS — M48061 Spinal stenosis, lumbar region without neurogenic claudication: Secondary | ICD-10-CM

## 2013-06-05 DIAGNOSIS — Z79899 Other long term (current) drug therapy: Secondary | ICD-10-CM

## 2013-06-05 DIAGNOSIS — M76899 Other specified enthesopathies of unspecified lower limb, excluding foot: Secondary | ICD-10-CM

## 2013-06-05 DIAGNOSIS — M7061 Trochanteric bursitis, right hip: Secondary | ICD-10-CM

## 2013-06-05 NOTE — Progress Notes (Signed)
Subjective:    Patient ID: Theresa Cline, female    DOB: 14-Aug-1951, 62 y.o.   MRN: [id number]  HPI Awaiting Left knee replacement, swelling in back of knee. Back pain radiating to LE, worse when on back Difficult to lay on back due to hip bursitis Pain Inventory Average Pain 8 Pain Right Now 8 My pain is sharp, tingling and aching  In the last 24 hours, has pain interfered with the following? General activity 9 Relation with others 10 Enjoyment of life 0 What TIME of day is your pain at its worst? constant Sleep (in general) Fair  Pain is worse with: walking, bending and inactivity Pain improves with: injections Relief from Meds: 0  Mobility use a cane how many minutes can you walk? 20 ability to climb steps?  no do you drive?  no  Function disabled: date disabled 1999 I need assistance with the following:  shopping  Neuro/Psych dizziness confusion depression anxiety loss of taste or smell  Prior Studies Any changes since last visit?  no  Physicians involved in your care Any changes since last visit?  no   Family History  Problem Relation Age of Onset  . Leukemia Mother     CLL  . Diabetes type II Mother   . Other Mother     Hypercholesterolemia  . Osteoarthritis Mother   . COPD Mother   . Hypertension Mother   . Hodgkin's lymphoma Father   . Diabetes type II Sister   . Osteoporosis Sister   . Paranoid behavior Brother     Unsure if diagnosed with Schizophrenia  . Diabetes type II Sister   . Hypertension Sister   . Diabetes type II Sister   . Hypertension Sister   . Schizophrenia Sister   . Heart disease Sister     Enlarged heart  . Glaucoma Sister   . Alcohol abuse Sister   . Glaucoma Sister   . Alcohol abuse Sister   . Bipolar disorder Daughter   . Lupus Daughter     SLE   History   Social History  . Marital Status: Divorced    Spouse Name: N/A    Number of Children: N/A  . Years of Education: N/A   Occupational History  .  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 Smoking status: Current Every Day Smoker -- 1.00 packs/day for 40 years    Types: Cigarettes  . Smokeless tobacco: Never Used  . Alcohol Use: None  . Drug Use: None  . Sexual Activity: None   Other Topics Concern  . None   Social History Narrative   Divorced   Disability mainly for back and chronic intermittent vertigo - since tumor removed - no surgical correction for back   Lives alone, keeps her grandson, 20 yr old daughter is a mother   Past Surgical History  Procedure Laterality Date  . Resection of left acoustic neuroma  1999  . Coronary angioplasty with stent placement  2004    Normal LV function  . Total abdominal hysterectomy w/ bilateral salpingoophorectomy  04/12/1988    For fibroid tumors; [place], [place]  . Carpal tunnel release  08/09/1998    Right  . Carpal tunnel release  09/08/1998    Left  . Removal lipoma      Right foot  . Cesarean section      x3   Past Medical History  Diagnosis Date  . S/P carpal tunnel release   . Vertigo   . Tobacco abuse   . Encounter for long-term (current) use of other medications   . Osteoporosis   . Left acoustic neuroma     Hx of  . Diabetic peripheral neuropathy   . Mixed hyperlipidemia   . Primary osteoarthritis of both knees   . Chronic pain syndrome   . Obesity   . Hypertension   . CAD (coronary artery disease)   . Diabetes mellitus type II, controlled   . Bronchitis, acute   . Diabetes type 2, controlled   . Cancer     Renal s/p nephrectomy   BP 112/67  Pulse 66  Resp 16  Ht 5\' 3"  (1.6 m)  Wt 154 lb (69.854 kg)  BMI 27.29 kg/m2  SpO2 96%     Review of Systems  Constitutional: Positive for fever, chills, appetite change and unexpected weight change.  Gastrointestinal: Positive for nausea and vomiting.  Neurological: Positive for dizziness.  Psychiatric/Behavioral: Positive for confusion and dysphoric mood.  The patient is nervous/anxious.   All other systems reviewed and are negative.       Objective:   Physical Exam Bilateral knees have limited flexion. Full extension. Mod effusion Left knee Back has tenderness palpation lumbar and thoracic paraspinals as well as over the gluteus muscles as well as the greater trochanters.  Motor strength is 5/5 in bilateral lower extremities in hip flexors knee extensors ankle dorsi flexion plantar flexor  Gait no evidence of toe drag or knee stability        Assessment & Plan:  1. 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 End-stage osteoarthritis of the knees , Left knee Baker's cyst 3. R>L trochanteric bursitis with pain only partially responsive to medication management and other conservative care,Will inject today  4. Chronic pain syndrome managed with nonnarcotic medications  Trochanteric bursa injection  without ultrasound guidance  Indication Trochanteric bursitis. Exam has tenderness over the greater trochanter of the hip. Pain has not responded to conservative care such as exercise therapy and oral medications. Pain interferes with sleep or with mobility Informed consent was obtained after describing risks and benefits of the procedure with the patient these include bleeding bruising and infection. Patient has signed written consent form. Patient placed in a lateral decubitus position with the affected hip superior. Point of maximal pain was palpated marked and prepped with Betadine and entered with a needle to bone contact. Needle slightly withdrawn then 40 mg Depo-Medrol with 4 cc 1% lidocaine were injected. Patient tolerated procedure well. Post procedure instructions given.

## 2013-06-05 NOTE — Patient Instructions (Addendum)
Hip Bursitis  Bursitis is a swelling and soreness (inflammation) of a fluid-filled sac (bursa). This sac overlies and protects the joints.   CAUSES   · Injury.  · Overuse of the muscles surrounding the joint.  · Arthritis.  · Gout.  · Infection.  · Cold weather.  · Inadequate warm-up and conditioning prior to activities.  The cause may not be known.   SYMPTOMS   · Mild to severe irritation.  · Tenderness and swelling over the outside of the hip.  · Pain with motion of the hip.  · If the bursa becomes infected, a fever may be present. Redness, tenderness, and warmth will develop over the hip.  Symptoms usually lessen in 3 to 4 weeks with treatment, but can come back.  TREATMENT  If conservative treatment does not work, your caregiver may advise draining the bursa and injecting cortisone into the area. This may speed up the healing process. This may also be used as an initial treatment of choice.  HOME CARE INSTRUCTIONS   · Apply ice to the affected area for 15-20 minutes every 3 to 4 hours while awake for the first 2 days. Put the ice in a plastic bag and place a towel between the bag of ice and your skin.  · Rest the painful joint as much as possible, but continue to put the joint through a normal range of motion at least 4 times per day. When the pain lessens, begin normal, slow movements and usual activities to help prevent stiffness of the hip.  · Only take over-the-counter or prescription medicines for pain, discomfort, or fever as directed by your caregiver.  · Use crutches to limit weight bearing on the hip joint, if advised.  · Elevate your painful hip to reduce swelling. Use pillows for propping and cushioning your legs and hips.  · Gentle massage may provide comfort and decrease swelling.  SEEK IMMEDIATE MEDICAL CARE IF:   · Your pain increases even during treatment, or you are not improving.  · You have a fever.  · You have heat and inflammation over the involved bursa.  · You have any other questions or  concerns.  MAKE SURE YOU:   · Understand these instructions.  · Will watch your condition.  · Will get help right away if you are not doing well or get worse.  Document Released: 03/17/2002 Document Revised: 12/18/2011 Document Reviewed: 10/14/2008  [employer]® Patient Information ©2014 [employer].

## 2013-06-11 ENCOUNTER — Encounter: Payer: Self-pay | Admitting: Cardiovascular Disease

## 2013-06-11 ENCOUNTER — Ambulatory Visit (INDEPENDENT_AMBULATORY_CARE_PROVIDER_SITE_OTHER): Payer: PRIVATE HEALTH INSURANCE | Admitting: Cardiovascular Disease

## 2013-06-11 VITALS — BP 144/84 | HR 70 | Ht 63.0 in | Wt 151.0 lb

## 2013-06-11 DIAGNOSIS — I714 Abdominal aortic aneurysm, without rupture, unspecified: Secondary | ICD-10-CM

## 2013-06-11 DIAGNOSIS — R609 Edema, unspecified: Secondary | ICD-10-CM

## 2013-06-11 DIAGNOSIS — R600 Localized edema: Secondary | ICD-10-CM

## 2013-06-11 DIAGNOSIS — I2510 Atherosclerotic heart disease of native coronary artery without angina pectoris: Secondary | ICD-10-CM

## 2013-06-11 DIAGNOSIS — I779 Disorder of arteries and arterioles, unspecified: Secondary | ICD-10-CM

## 2013-06-11 NOTE — Progress Notes (Signed)
History of Present Illness: 62 yo AAF with h/o CAD s/p MI and stents x 3 in 2004, HTN, Hyperlipidemia, DM type II who presents today for a routine visit. Her stress myoview in 2010 showed no ischemia. She is known to have mild carotid artery disease. Echo October 2010 showed normal LV size and function without any significant valvular problems. 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 I repeated her AAA u/s March 2014 and the AAA is small. I saw her in October 2012 after she had a syncopal event. She told me that she was sitting on the toilet and when she stood up, she passed out. This was following a bowel movement. She felt lightheaded when standing and passed out. She does have vertigo and had an acoustic neuroma resected in 1999. No recurrences after that. She is on Lasix for chronic lower ext edema.   She is here today for follow up. She is doing well overall. She has no chest pain or SOB. She forgot her medications last night so her BP is up today. LE edema is resolved on Lasix.   Primary Care Physician: Nicholos Johns  Last Lipid Profile: Followed in primary care.   Past Medical History  Diagnosis Date  . S/P carpal tunnel release   . Vertigo   . Tobacco abuse   . Encounter for long-term (current) use of other medications   . Osteoporosis   . Left acoustic neuroma     Hx of  . Diabetic peripheral neuropathy   . Mixed hyperlipidemia   . Primary osteoarthritis of both knees   . Chronic pain syndrome   . Obesity   . Hypertension   . CAD (coronary artery disease)   . Diabetes mellitus type II, controlled   . Bronchitis, acute   . Diabetes type 2, controlled   . Cancer     Renal s/p nephrectomy    Past Surgical History  Procedure Laterality Date  . Resection of left acoustic neuroma  1999  . Coronary angioplasty with stent placement  2004    Normal LV function  . Total abdominal hysterectomy w/ bilateral  salpingoophorectomy  04/12/1988    For fibroid tumors; [place], [place]  . Carpal tunnel release  08/09/1998    Right  . Carpal tunnel release  09/08/1998    Left  . Removal lipoma      Right foot  . Cesarean section      x3    Current Outpatient Prescriptions  Medication Sig Dispense Refill  . alendronate (FOSAMAX) 70 MG tablet Take 70 mg by mouth every 7 (seven) days. Take with a full glass of water on an empty stomach.       Marland Kitchen amLODipine (NORVASC) 5 MG tablet TAKE 1 TABLET EVERY DAY  30 tablet  5  . aspirin 81 MG tablet Take 81 mg by mouth daily.      Marland Kitchen atorvastatin (LIPITOR) 40 MG tablet Take 40 mg by mouth daily.       . citalopram (CELEXA) 40 MG tablet Take 40 mg by mouth daily. **Needs office visit.**       . cyclobenzaprine (FLEXERIL) 5 MG tablet Take 10 mg by mouth 3 (three) times daily as needed.      . diclofenac sodium (VOLTAREN) 1 % GEL Apply 2 g topically 4 (four) times daily.  3 Tube  2  . fish oil-omega-3 fatty acids 1000 MG capsule Take 1  g by mouth daily.        . furosemide (LASIX) 20 MG tablet Take 1 tablet (20 mg total) by mouth daily.  30 tablet  6  . metFORMIN (GLUCOPHAGE) 1000 MG tablet Take 1,000 mg by mouth 2 (two) times daily with a meal.        . metoprolol (TOPROL-XL) 100 MG 24 hr tablet Take 100 mg by mouth daily.        . nitroGLYCERIN (NITROLINGUAL) 0.4 MG/SPRAY spray Place 1 spray under the tongue every 5 (five) minutes as needed for chest pain.  12 g  6  . omeprazole (PRILOSEC) 40 MG capsule Take 40 mg by mouth daily.        . traMADol (ULTRAM) 50 MG tablet Take 2 tablets (100 mg total) by mouth every 8 (eight) hours as needed for pain.  180 tablet  2  . valsartan-hydrochlorothiazide (DIOVAN-HCT) 160-12.5 MG per tablet Take 1 tablet by mouth 2 (two) times daily.        . vitamin E 400 UNIT capsule Take 400 Units by mouth daily.       No current facility-administered medications for this visit.    Allergies  Allergen Reactions  . Gabapentin Swelling    . Adhesive [Tape] Rash    History   Social History  . Marital Status: Divorced    Spouse Name: N/A    Number of Children: N/A  . Years of Education: N/A   Occupational History  . 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 Smoking status: Current Every Day Smoker -- 1.00 packs/day for 40 years    Types: Cigarettes  . Smokeless tobacco: Never Used  . Alcohol Use: Not on file  . Drug Use: Not on file  . Sexual Activity: Not on file   Other Topics Concern  . Not on file   Social History Narrative   Divorced   Disability mainly for back and chronic intermittent vertigo - since tumor removed - no surgical correction for back   Lives alone, keeps her grandson, 54 yr old daughter is a mother    Family History  Problem Relation Age of Onset  . Leukemia Mother     CLL  . Diabetes type II Mother   . Other Mother     Hypercholesterolemia  . Osteoarthritis Mother   . COPD Mother   . Hypertension Mother   . Hodgkin's lymphoma Father   . Diabetes type II Sister   . Osteoporosis Sister   . Paranoid behavior Brother     Unsure if diagnosed with Schizophrenia  . Diabetes type II Sister   . Hypertension Sister   . Diabetes type II Sister   . Hypertension Sister   . Schizophrenia Sister   . Heart disease Sister     Enlarged heart  . Glaucoma Sister   . Alcohol abuse Sister   . Glaucoma Sister   . Alcohol abuse Sister   . Bipolar disorder Daughter   . Lupus Daughter     SLE    Review of Systems:  As stated in the HPI and otherwise negative.   BP 144/84  Pulse 70  Ht 5\' 3"  (1.6 m)  Wt 151 lb (68.493 kg)  BMI 26.76 kg/m2  Physical Examination: General: Well developed, well nourished, NAD HEENT: OP clear, mucus membranes moist SKIN: warm, dry. No rashes. Neuro: No focal deficits Musculoskeletal: Muscle  strength 5/5 all ext Psychiatric: Mood and affect normal Neck: No JVD, no carotid bruits, no thyromegaly, no  lymphadenopathy. Lungs:Clear bilaterally, no wheezes, rhonci, crackles Cardiovascular: Regular rate and rhythm. No murmurs, gallops or rubs. Abdomen:Soft. Bowel sounds present. Non-tender.  Extremities: No lower extremity edema. Pulses are 2 + in the bilateral DP/PT.  Assessment and Plan:   1. CAD: Stable. Continue current therapy. No changes. Lipids are followed in primary care.   2. Carotid artery disease: Mild disease by dopplers March 2014. Repeat March 2015.    3. AAA: Known history of AAA. Mild by u/s March 2014. Repeat March 2016.    4. Lower extremity edema: Norvasc has been held. LE edema better on scheduled Lasix.  She will use extra as needed.

## 2013-06-11 NOTE — Patient Instructions (Addendum)
Your physician wants you to follow-up in:  6 months. You will receive a reminder letter in the mail two months in advance. If you don't receive a letter, please call our office to schedule the follow-up appointment.   

## 2013-07-21 ENCOUNTER — Ambulatory Visit (INDEPENDENT_AMBULATORY_CARE_PROVIDER_SITE_OTHER): Payer: PRIVATE HEALTH INSURANCE | Admitting: Podiatry

## 2013-07-21 ENCOUNTER — Encounter: Payer: Self-pay | Admitting: Podiatry

## 2013-07-21 DIAGNOSIS — E1149 Type 2 diabetes mellitus with other diabetic neurological complication: Secondary | ICD-10-CM

## 2013-07-21 DIAGNOSIS — Q828 Other specified congenital malformations of skin: Secondary | ICD-10-CM

## 2013-07-21 NOTE — Progress Notes (Signed)
Subjective:     Patient ID: Theresa Cline, female   DOB: 18-Jul-1951, 62 y.o.   MRN: [id number]  Diabetes   patient states that she is a diabetic has neurological symptoms and reduced circulation and has lesions that become very painful. States the diabetic shoes have been helpful but she is due for a new pair   Review of Systems  All other systems reviewed and are negative.       Objective:   Physical Exam  Nursing note and vitals reviewed. Constitutional: She appears well-nourished.  Musculoskeletal: Normal range of motion.  Neurological: She is alert.  Skin: Skin is warm.   Patient is keratotic lesions fifth digit left over right and fifth metatarsal bilateral.   lesions are painful when pressed. Are diminished bilateral  Assessment:     At risk diabetic with lesion formation and structural malalignment    Plan:     H&P performed and discussion about daily inspections reviewed. I then debrided the lesions on both feet which was tolerated well with no iatrogenic bleeding noted. We will get approval for diabetic shoes

## 2013-08-20 ENCOUNTER — Other Ambulatory Visit: Payer: Self-pay | Admitting: Physical Medicine & Rehabilitation

## 2013-08-23 ENCOUNTER — Other Ambulatory Visit: Payer: Self-pay | Admitting: Physical Medicine & Rehabilitation

## 2013-08-25 NOTE — Telephone Encounter (Signed)
Continue current prescription with 2 refills

## 2013-08-28 ENCOUNTER — Encounter: Payer: PRIVATE HEALTH INSURANCE | Attending: Physical Medicine & Rehabilitation

## 2013-08-28 ENCOUNTER — Encounter: Payer: Self-pay | Admitting: Physical Medicine & Rehabilitation

## 2013-08-28 ENCOUNTER — Ambulatory Visit (HOSPITAL_BASED_OUTPATIENT_CLINIC_OR_DEPARTMENT_OTHER): Payer: PRIVATE HEALTH INSURANCE | Admitting: Physical Medicine & Rehabilitation

## 2013-08-28 ENCOUNTER — Encounter: Payer: Self-pay | Admitting: *Deleted

## 2013-08-28 VITALS — BP 136/86 | HR 68 | Resp 14 | Ht 63.0 in | Wt 149.6 lb

## 2013-08-28 DIAGNOSIS — M7120 Synovial cyst of popliteal space [Baker], unspecified knee: Secondary | ICD-10-CM | POA: Insufficient documentation

## 2013-08-28 DIAGNOSIS — M48062 Spinal stenosis, lumbar region with neurogenic claudication: Secondary | ICD-10-CM | POA: Insufficient documentation

## 2013-08-28 DIAGNOSIS — M76899 Other specified enthesopathies of unspecified lower limb, excluding foot: Secondary | ICD-10-CM | POA: Diagnosis not present

## 2013-08-28 DIAGNOSIS — G894 Chronic pain syndrome: Secondary | ICD-10-CM | POA: Insufficient documentation

## 2013-08-28 DIAGNOSIS — M549 Dorsalgia, unspecified: Secondary | ICD-10-CM | POA: Diagnosis present

## 2013-08-28 DIAGNOSIS — M7061 Trochanteric bursitis, right hip: Secondary | ICD-10-CM

## 2013-08-28 DIAGNOSIS — M1710 Unilateral primary osteoarthritis, unspecified knee: Secondary | ICD-10-CM | POA: Insufficient documentation

## 2013-08-28 NOTE — Progress Notes (Signed)
Subjective:    Patient ID: Theresa Cline, female    DOB: 12-Jan-1951, 62 y.o.   MRN: [id number]  HPI 4 falls in the last 2 months no LOC, tripped on dishwasher.  No serious injury.  Had balance program in 2009.  This has been going on since Brain surgery 1999 Left acoustic neuroma  Left hip more painful than Right, cannot lay more than 10 minutes  Also scheduled for sleep study Pain Inventory Average Pain 9 Pain Right Now 9 My pain is tingling and aching  In the last 24 hours, has pain interfered with the following? General activity 9 Relation with others 9 Enjoyment of life 9 What TIME of day is your pain at its worst? constant Sleep (in general) Poor  Pain is worse with: walking, bending and standing Pain improves with: rest and injections Relief from Meds: 0  Mobility use a cane how many minutes can you walk? 30 ability to climb steps?  no do you drive?  no  Function retired  Neuro/Psych weakness dizziness confusion depression anxiety loss of taste or smell  Prior Studies Any changes since last visit?  no  Physicians involved in your care Any changes since last visit?  no   Family History  Problem Relation Age of Onset  . Leukemia Mother     CLL  . Diabetes type II Mother   . Other Mother     Hypercholesterolemia  . Osteoarthritis Mother   . COPD Mother   . Hypertension Mother   . Hodgkin's lymphoma Father   . Diabetes type II Sister   . Osteoporosis Sister   . Paranoid behavior Brother     Unsure if diagnosed with Schizophrenia  . Diabetes type II Sister   . Hypertension Sister   . Diabetes type II Sister   . Hypertension Sister   . Schizophrenia Sister   . Heart disease Sister     Enlarged heart  . Glaucoma Sister   . Alcohol abuse Sister   . Glaucoma Sister   . Alcohol abuse Sister   . Bipolar disorder Daughter   . Lupus Daughter     SLE   History   Social History  . Marital Status: Divorced    Spouse Name: N/A    Number  of Children: N/A  . Years of Education: N/A   Occupational History  . 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 Smoking status: Current Every Day Smoker -- 1.00 packs/day for 40 years    Types: Cigarettes  . Smokeless tobacco: Never Used  . Alcohol Use: None  . Drug Use: None  . Sexual Activity: None   Other Topics Concern  . None   Social History Narrative   Divorced   Disability mainly for back and chronic intermittent vertigo - since tumor removed - no surgical correction for back   Lives alone, keeps her grandson, 80 yr old daughter is a mother   Past Surgical History  Procedure Laterality Date  . Resection of left acoustic neuroma  1999  . Coronary angioplasty with stent placement  2004    Normal LV function  . Total abdominal hysterectomy w/ bilateral salpingoophorectomy  04/12/1988    For fibroid tumors; [place], [place]  . Carpal tunnel release  08/09/1998    Right  . Carpal tunnel release  09/08/1998    Left  . Removal lipoma  Right foot  . Cesarean section      x3   Past Medical History  Diagnosis Date  . S/P carpal tunnel release   . Vertigo   . Tobacco abuse   . Encounter for long-term (current) use of other medications   . Osteoporosis   . Left acoustic neuroma     Hx of  . Diabetic peripheral neuropathy   . Mixed hyperlipidemia   . Primary osteoarthritis of both knees   . Chronic pain syndrome   . Obesity   . Hypertension   . CAD (coronary artery disease)   . Diabetes mellitus type II, controlled   . Bronchitis, acute   . Diabetes type 2, controlled   . Cancer     Renal s/p nephrectomy   BP 136/86  Pulse 68  Resp 14  Ht 5\' 3"  (1.6 m)  Wt 149 lb 9.6 oz (67.858 kg)  BMI 26.51 kg/m2  SpO2 93%     Review of Systems  Constitutional: Positive for appetite change.  Gastrointestinal: Positive for nausea and vomiting.  Musculoskeletal: Positive for back pain.  Neurological: Positive for  dizziness and weakness.  Psychiatric/Behavioral: Positive for confusion and dysphoric mood. The patient is nervous/anxious.   All other systems reviewed and are negative.       Objective:   Physical Exam  Tenderness to palpation over the left greater trochanter of the hip      Assessment & Plan:  1.  L>R hip trochanteric bursitis   Left Trochanteric bursa injection  without ultrasound guidance  Indication Left Trochanteric bursitis. Exam has tenderness over the greater trochanter of the hip. Pain has not responded to conservative care such as exercise therapy and oral medications. Pain interferes with sleep or with mobility Informed consent was obtained after describing risks and benefits of the procedure with the patient these include bleeding bruising and infection. Patient has signed written consent form. Patient placed in a lateral decubitus position with the affected hip superior. Point of maximal pain was palpated marked and prepped with Betadine and entered with a needle to bone contact. Needle slightly withdrawn then 6mg  betamethasone with 4 cc 1% lidocaine were injected. Patient tolerated procedure well. Post procedure instructions given.  2.  Balance d/o multifactorial, hx acoustic neuroma with residual deficit.  Also has orthopedic issues and peripheral neuropathy, patient will resume her home exercise program from physical therapy. Also recommended formal physical therapy, the patient will call me if she wishes to proceed with this

## 2013-09-25 ENCOUNTER — Encounter (HOSPITAL_COMMUNITY): Payer: Self-pay | Admitting: Emergency Medicine

## 2013-09-25 ENCOUNTER — Emergency Department (HOSPITAL_COMMUNITY): Payer: PRIVATE HEALTH INSURANCE

## 2013-09-25 ENCOUNTER — Inpatient Hospital Stay (HOSPITAL_COMMUNITY)
Admission: EM | Admit: 2013-09-25 | Discharge: 2013-09-28 | DRG: 195 | Disposition: A | Discharge status: Home / Self Care | Payer: PRIVATE HEALTH INSURANCE | Attending: Internal Medicine | Admitting: Internal Medicine

## 2013-09-25 DIAGNOSIS — Z806 Family history of leukemia: Secondary | ICD-10-CM

## 2013-09-25 DIAGNOSIS — Z833 Family history of diabetes mellitus: Secondary | ICD-10-CM

## 2013-09-25 DIAGNOSIS — Z7982 Long term (current) use of aspirin: Secondary | ICD-10-CM

## 2013-09-25 DIAGNOSIS — Z79899 Other long term (current) drug therapy: Secondary | ICD-10-CM

## 2013-09-25 DIAGNOSIS — F17200 Nicotine dependence, unspecified, uncomplicated: Secondary | ICD-10-CM | POA: Diagnosis present

## 2013-09-25 DIAGNOSIS — E876 Hypokalemia: Secondary | ICD-10-CM

## 2013-09-25 DIAGNOSIS — I2510 Atherosclerotic heart disease of native coronary artery without angina pectoris: Secondary | ICD-10-CM | POA: Diagnosis present

## 2013-09-25 DIAGNOSIS — IMO0002 Reserved for concepts with insufficient information to code with codable children: Secondary | ICD-10-CM

## 2013-09-25 DIAGNOSIS — Z905 Acquired absence of kidney: Secondary | ICD-10-CM

## 2013-09-25 DIAGNOSIS — E119 Type 2 diabetes mellitus without complications: Secondary | ICD-10-CM

## 2013-09-25 DIAGNOSIS — J209 Acute bronchitis, unspecified: Secondary | ICD-10-CM

## 2013-09-25 DIAGNOSIS — Z8249 Family history of ischemic heart disease and other diseases of the circulatory system: Secondary | ICD-10-CM

## 2013-09-25 DIAGNOSIS — G894 Chronic pain syndrome: Secondary | ICD-10-CM | POA: Diagnosis present

## 2013-09-25 DIAGNOSIS — E782 Mixed hyperlipidemia: Secondary | ICD-10-CM | POA: Diagnosis present

## 2013-09-25 DIAGNOSIS — E1140 Type 2 diabetes mellitus with diabetic neuropathy, unspecified: Secondary | ICD-10-CM | POA: Diagnosis present

## 2013-09-25 DIAGNOSIS — R197 Diarrhea, unspecified: Secondary | ICD-10-CM

## 2013-09-25 DIAGNOSIS — Z818 Family history of other mental and behavioral disorders: Secondary | ICD-10-CM

## 2013-09-25 DIAGNOSIS — I10 Essential (primary) hypertension: Secondary | ICD-10-CM

## 2013-09-25 DIAGNOSIS — E1142 Type 2 diabetes mellitus with diabetic polyneuropathy: Secondary | ICD-10-CM | POA: Diagnosis present

## 2013-09-25 DIAGNOSIS — Z85528 Personal history of other malignant neoplasm of kidney: Secondary | ICD-10-CM

## 2013-09-25 DIAGNOSIS — Z807 Family history of other malignant neoplasms of lymphoid, hematopoietic and related tissues: Secondary | ICD-10-CM

## 2013-09-25 DIAGNOSIS — E669 Obesity, unspecified: Secondary | ICD-10-CM | POA: Diagnosis present

## 2013-09-25 DIAGNOSIS — J09X2 Influenza due to identified novel influenza A virus with other respiratory manifestations: Principal | ICD-10-CM

## 2013-09-25 DIAGNOSIS — E1149 Type 2 diabetes mellitus with other diabetic neurological complication: Secondary | ICD-10-CM | POA: Diagnosis present

## 2013-09-25 LAB — CBC WITH DIFFERENTIAL/PLATELET
Eosinophils Absolute: 0 10*3/uL (ref 0.0–0.7)
Eosinophils Relative: 0 % (ref 0–5)
HCT: 36.1 % (ref 36.0–46.0)
Lymphocytes Relative: 9 % — ABNORMAL LOW (ref 12–46)
Lymphs Abs: 0.4 10*3/uL — ABNORMAL LOW (ref 0.7–4.0)
MCH: 28.8 pg (ref 26.0–34.0)
MCV: 84.5 fL (ref 78.0–100.0)
Monocytes Absolute: 0.7 10*3/uL (ref 0.1–1.0)
Monocytes Relative: 17 % — ABNORMAL HIGH (ref 3–12)
Platelets: 172 10*3/uL (ref 150–400)
RBC: 4.27 MIL/uL (ref 3.87–5.11)
WBC: 4.1 10*3/uL (ref 4.0–10.5)

## 2013-09-25 LAB — URINALYSIS, ROUTINE W REFLEX MICROSCOPIC
Bilirubin Urine: NEGATIVE
Glucose, UA: NEGATIVE mg/dL
Hgb urine dipstick: NEGATIVE
Specific Gravity, Urine: 1.015 (ref 1.005–1.030)
Urobilinogen, UA: 0.2 mg/dL (ref 0.0–1.0)

## 2013-09-25 LAB — COMPREHENSIVE METABOLIC PANEL
ALT: 12 U/L (ref 0–35)
AST: 24 U/L (ref 0–37)
Albumin: 3.3 g/dL — ABNORMAL LOW (ref 3.5–5.2)
Alkaline Phosphatase: 57 U/L (ref 39–117)
Chloride: 91 mEq/L — ABNORMAL LOW (ref 96–112)
GFR calc Af Amer: 88 mL/min — ABNORMAL LOW (ref >90)
GFR calc non Af Amer: 76 mL/min — ABNORMAL LOW (ref >90)
Glucose, Bld: 122 mg/dL — ABNORMAL HIGH (ref 70–99)
Potassium: 2.7 mEq/L — CL (ref 3.5–5.1)
Total Bilirubin: 0.3 mg/dL (ref 0.3–1.2)

## 2013-09-25 LAB — GLUCOSE, CAPILLARY: Glucose-Capillary: 92 mg/dL (ref 70–99)

## 2013-09-25 MED ORDER — POTASSIUM CHLORIDE CRYS ER 20 MEQ PO TBCR
40.0000 meq | EXTENDED_RELEASE_TABLET | Freq: Once | ORAL | Status: AC
  Administered 2013-09-25: 40 meq via ORAL
  Filled 2013-09-25: qty 2

## 2013-09-25 MED ORDER — HYDROCODONE-ACETAMINOPHEN 5-325 MG PO TABS
1.0000 | ORAL_TABLET | Freq: Four times a day (QID) | ORAL | Status: DC | PRN
  Administered 2013-09-25 – 2013-09-27 (×5): 2 via ORAL
  Filled 2013-09-25 (×5): qty 2

## 2013-09-25 MED ORDER — IOHEXOL 300 MG/ML  SOLN
100.0000 mL | Freq: Once | INTRAMUSCULAR | Status: AC | PRN
  Administered 2013-09-25: 80 mL via INTRAVENOUS

## 2013-09-25 MED ORDER — VALSARTAN-HYDROCHLOROTHIAZIDE 160-12.5 MG PO TABS: 1.0000 | ORAL_TABLET | Freq: Two times a day (BID) | ORAL | Status: DC

## 2013-09-25 MED ORDER — ALBUTEROL SULFATE (5 MG/ML) 0.5% IN NEBU: 2.5000 mg | INHALATION_SOLUTION | Freq: Four times a day (QID) | RESPIRATORY_TRACT | Status: DC | PRN

## 2013-09-25 MED ORDER — CITALOPRAM HYDROBROMIDE 40 MG PO TABS
60.0000 mg | ORAL_TABLET | Freq: Every day | ORAL | Status: DC
  Administered 2013-09-25 – 2013-09-26 (×2): 60 mg via ORAL
  Filled 2013-09-25 (×2): qty 1

## 2013-09-25 MED ORDER — MORPHINE SULFATE 4 MG/ML IJ SOLN
4.0000 mg | Freq: Once | INTRAMUSCULAR | Status: AC
  Administered 2013-09-25: 4 mg via INTRAVENOUS
  Filled 2013-09-25: qty 1

## 2013-09-25 MED ORDER — FLUTICASONE PROPIONATE 50 MCG/ACT NA SUSP: 1.0000 | Freq: Every day | NASAL | Status: DC | PRN

## 2013-09-25 MED ORDER — CLONAZEPAM 0.5 MG PO TABS
0.5000 mg | ORAL_TABLET | Freq: Every day | ORAL | Status: DC
  Administered 2013-09-25 – 2013-09-27 (×3): 0.5 mg via ORAL
  Filled 2013-09-25 (×3): qty 1

## 2013-09-25 MED ORDER — ASPIRIN EC 81 MG PO TBEC
81.0000 mg | DELAYED_RELEASE_TABLET | Freq: Every day | ORAL | Status: DC
  Administered 2013-09-25 – 2013-09-28 (×4): 81 mg via ORAL
  Filled 2013-09-25 (×4): qty 1

## 2013-09-25 MED ORDER — LEVOFLOXACIN IN D5W 500 MG/100ML IV SOLN
500.0000 mg | INTRAVENOUS | Status: DC
  Administered 2013-09-25 – 2013-09-26 (×2): 500 mg via INTRAVENOUS
  Filled 2013-09-25 (×3): qty 100

## 2013-09-25 MED ORDER — METOPROLOL SUCCINATE ER 100 MG PO TB24
100.0000 mg | ORAL_TABLET | Freq: Every day | ORAL | Status: DC
  Administered 2013-09-26 – 2013-09-28 (×3): 100 mg via ORAL
  Filled 2013-09-25 (×3): qty 1

## 2013-09-25 MED ORDER — HYDRALAZINE HCL 20 MG/ML IJ SOLN
5.0000 mg | Freq: Four times a day (QID) | INTRAMUSCULAR | Status: DC | PRN
  Administered 2013-09-25: 5 mg via INTRAVENOUS
  Filled 2013-09-25: qty 1

## 2013-09-25 MED ORDER — VITAMIN E 180 MG (400 UNIT) PO CAPS
400.0000 [IU] | ORAL_CAPSULE | Freq: Every day | ORAL | Status: DC
  Administered 2013-09-25 – 2013-09-28 (×4): 400 [IU] via ORAL
  Filled 2013-09-25 (×4): qty 1

## 2013-09-25 MED ORDER — POTASSIUM CHLORIDE 10 MEQ/100ML IV SOLN
10.0000 meq | INTRAVENOUS | Status: DC
  Administered 2013-09-25 (×2): 10 meq via INTRAVENOUS
  Filled 2013-09-25 (×2): qty 100

## 2013-09-25 MED ORDER — SODIUM CHLORIDE 0.9 % IJ SOLN
3.0000 mL | Freq: Two times a day (BID) | INTRAMUSCULAR | Status: DC
  Administered 2013-09-25 – 2013-09-27 (×3): 3 mL via INTRAVENOUS

## 2013-09-25 MED ORDER — ONDANSETRON HCL 4 MG/2ML IJ SOLN: 4.0000 mg | Freq: Once | INTRAMUSCULAR | Status: DC

## 2013-09-25 MED ORDER — IRBESARTAN 150 MG PO TABS
150.0000 mg | ORAL_TABLET | Freq: Two times a day (BID) | ORAL | Status: DC
  Administered 2013-09-25 – 2013-09-28 (×6): 150 mg via ORAL
  Filled 2013-09-25 (×7): qty 1

## 2013-09-25 MED ORDER — INSULIN ASPART 100 UNIT/ML ~~LOC~~ SOLN
0.0000 [IU] | Freq: Three times a day (TID) | SUBCUTANEOUS | Status: DC
  Administered 2013-09-26: 12:00:00 via SUBCUTANEOUS
  Administered 2013-09-27: 2 [IU] via SUBCUTANEOUS

## 2013-09-25 MED ORDER — GUAIFENESIN-DM 100-10 MG/5ML PO SYRP
5.0000 mL | ORAL_SOLUTION | ORAL | Status: DC | PRN
  Administered 2013-09-26 – 2013-09-27 (×2): 5 mL via ORAL
  Filled 2013-09-25 (×2): qty 10

## 2013-09-25 MED ORDER — HYDROMORPHONE HCL PF 1 MG/ML IJ SOLN: 1.0000 mg | Freq: Once | INTRAMUSCULAR | Status: DC

## 2013-09-25 MED ORDER — ENOXAPARIN SODIUM 40 MG/0.4ML ~~LOC~~ SOLN
40.0000 mg | SUBCUTANEOUS | Status: DC
  Administered 2013-09-25 – 2013-09-27 (×3): 40 mg via SUBCUTANEOUS
  Filled 2013-09-25 (×4): qty 0.4

## 2013-09-25 MED ORDER — HYDROCHLOROTHIAZIDE 12.5 MG PO CAPS
12.5000 mg | ORAL_CAPSULE | Freq: Two times a day (BID) | ORAL | Status: DC
  Administered 2013-09-25 – 2013-09-28 (×6): 12.5 mg via ORAL
  Filled 2013-09-25 (×8): qty 1

## 2013-09-25 MED ORDER — IPRATROPIUM BROMIDE 0.02 % IN SOLN: 0.5000 mg | Freq: Four times a day (QID) | RESPIRATORY_TRACT | Status: DC | PRN

## 2013-09-25 MED ORDER — AMLODIPINE BESYLATE 5 MG PO TABS
5.0000 mg | ORAL_TABLET | Freq: Every day | ORAL | Status: DC
  Administered 2013-09-25 – 2013-09-28 (×4): 5 mg via ORAL
  Filled 2013-09-25 (×4): qty 1

## 2013-09-25 MED ORDER — IOHEXOL 300 MG/ML  SOLN
50.0000 mL | Freq: Once | INTRAMUSCULAR | Status: AC | PRN
  Administered 2013-09-25: 50 mL via ORAL

## 2013-09-25 MED ORDER — SODIUM CHLORIDE 0.9 % IV SOLN
INTRAVENOUS | Status: AC
  Administered 2013-09-25: 17:00:00 via INTRAVENOUS

## 2013-09-25 MED ORDER — TRAMADOL HCL 50 MG PO TABS
50.0000 mg | ORAL_TABLET | Freq: Three times a day (TID) | ORAL | Status: DC | PRN
  Filled 2013-09-25: qty 1

## 2013-09-25 MED ORDER — ATORVASTATIN CALCIUM 40 MG PO TABS
40.0000 mg | ORAL_TABLET | Freq: Every day | ORAL | Status: DC
  Administered 2013-09-25 – 2013-09-28 (×4): 40 mg via ORAL
  Filled 2013-09-25 (×4): qty 1

## 2013-09-25 NOTE — ED Notes (Signed)
Pt unable to obtain UA specimen. Encouraged pt to notify staff when she needs to go again.

## 2013-09-25 NOTE — Progress Notes (Signed)
Called ED to get report on pt. Put on hold for 4 minutes. Hung up and called back. No pick up

## 2013-09-25 NOTE — ED Notes (Signed)
Per pt/family, cold symptoms on and off for 2 weeks-saw PCP and was given cough med and antibiotics-not helping and has not gotten better

## 2013-09-25 NOTE — ED Provider Notes (Signed)
CSN: [id number]     Arrival date & time 09/25/13  0945 History   First MD Initiated Contact with Patient 09/25/13 1003     Chief Complaint  Patient presents with  . Fever   (Consider location/radiation/quality/duration/timing/severity/associated sxs/prior Treatment) HPI Comments: Patient presents to the emergency department with chief complaint of cough and cold-like symptoms. She states that she's had a cough for several weeks, but it has acutely worsened over the past 2 days. Patient was seen by her primary care provider at the beginning of December, and was given some antibiotics, but she states that these have not been helping. She endorses running a fever up to 102. She denies nausea, vomiting, diarrhea, or constipation. Additionally, she endorses some right lower quadrant abdominal pain, and states that she has had a right nephrectomy approximately 2 years ago. This was removed secondary to cancer. She states that she also has generalized arthralgias and myalgias.  The history is provided by the patient. No language interpreter was used.    Past Medical History  Diagnosis Date  . S/P carpal tunnel release   . Vertigo   . Tobacco abuse   . Encounter for long-term (current) use of other medications   . Osteoporosis   . Left acoustic neuroma     Hx of  . Diabetic peripheral neuropathy   . Mixed hyperlipidemia   . Primary osteoarthritis of both knees   . Chronic pain syndrome   . Obesity   . Hypertension   . CAD (coronary artery disease)   . Diabetes mellitus type II, controlled   . Bronchitis, acute   . Diabetes type 2, controlled   . Cancer     Renal s/p nephrectomy   Past Surgical History  Procedure Laterality Date  . Resection of left acoustic neuroma  1999  . Coronary angioplasty with stent placement  2004    Normal LV function  . Total abdominal hysterectomy w/ bilateral salpingoophorectomy  04/12/1988    For fibroid tumors; [place], [place]  . Carpal tunnel release   08/09/1998    Right  . Carpal tunnel release  09/08/1998    Left  . Removal lipoma      Right foot  . Cesarean section      x3   Family History  Problem Relation Age of Onset  . Leukemia Mother     CLL  . Diabetes type II Mother   . Other Mother     Hypercholesterolemia  . Osteoarthritis Mother   . COPD Mother   . Hypertension Mother   . Hodgkin's lymphoma Father   . Diabetes type II Sister   . Osteoporosis Sister   . Paranoid behavior Brother     Unsure if diagnosed with Schizophrenia  . Diabetes type II Sister   . Hypertension Sister   . Diabetes type II Sister   . Hypertension Sister   . Schizophrenia Sister   . Heart disease Sister     Enlarged heart  . Glaucoma Sister   . Alcohol abuse Sister   . Glaucoma Sister   . Alcohol abuse Sister   . Bipolar disorder Daughter   . Lupus Daughter     SLE   History  Substance Use Topics  . Smoking status: Current Every Day Smoker -- 1.00 packs/day for 40 years    Types: Cigarettes  . Smokeless tobacco: Never Used  . Alcohol Use: Not on file   OB History   Grav Para Term Preterm Abortions TAB SAB Ect  Mult Living                 Review of Systems  All other systems reviewed and are negative.    Allergies  Gabapentin and Adhesive  Home Medications   Current Outpatient Rx  Name  Route  Sig  Dispense  Refill  . alendronate (FOSAMAX) 70 MG tablet   Oral   Take 70 mg by mouth every 7 (seven) days. Take with a full glass of water on an empty stomach.          Marland Kitchen amLODipine (NORVASC) 5 MG tablet      TAKE 1 TABLET EVERY DAY   30 tablet   5   . aspirin 81 MG tablet   Oral   Take 81 mg by mouth daily.         Marland Kitchen atorvastatin (LIPITOR) 40 MG tablet   Oral   Take 40 mg by mouth daily.          . citalopram (CELEXA) 40 MG tablet   Oral   Take 60 mg by mouth daily. **Needs office visit.**         . cyclobenzaprine (FLEXERIL) 5 MG tablet   Oral   Take 10 mg by mouth 3 (three) times daily as  needed.         . diclofenac sodium (VOLTAREN) 1 % GEL   Topical   Apply 2 g topically 4 (four) times daily.   3 Tube   2   . fish oil-omega-3 fatty acids 1000 MG capsule   Oral   Take 1 g by mouth daily.           . furosemide (LASIX) 20 MG tablet   Oral   Take 1 tablet (20 mg total) by mouth daily.   30 tablet   6   . metFORMIN (GLUCOPHAGE) 1000 MG tablet   Oral   Take 1,000 mg by mouth 2 (two) times daily with a meal.           . metoprolol (TOPROL-XL) 100 MG 24 hr tablet   Oral   Take 100 mg by mouth daily.           . nitroGLYCERIN (NITROLINGUAL) 0.4 MG/SPRAY spray   Sublingual   Place 1 spray under the tongue every 5 (five) minutes as needed for chest pain.   12 g   6   . omeprazole (PRILOSEC) 40 MG capsule   Oral   Take 40 mg by mouth daily.           . traMADol (ULTRAM) 50 MG tablet      TAKE 2 TABLETS (100 MG TOTAL) BY MOUTH EVERY 8 (EIGHT) HOURS AS NEEDED FOR PAIN.   180 tablet   2   . valsartan-hydrochlorothiazide (DIOVAN-HCT) 160-12.5 MG per tablet   Oral   Take 1 tablet by mouth 2 (two) times daily.           . vitamin E 400 UNIT capsule   Oral   Take 400 Units by mouth daily.          BP 173/86  Pulse 86  Temp(Src) 98.9 F (37.2 C) (Oral)  Resp 18  SpO2 96% Physical Exam  Nursing note and vitals reviewed. Constitutional: She is oriented to person, place, and time. She appears well-developed and well-nourished.  HENT:  Head: Normocephalic and atraumatic.  Eyes: Conjunctivae and EOM are normal. Pupils are equal, round, and reactive to light.  Neck: Normal range  of motion. Neck supple.  Cardiovascular: Normal rate and regular rhythm.  Exam reveals no gallop and no friction rub.   No murmur heard. Pulmonary/Chest: Effort normal and breath sounds normal. No respiratory distress. She has no wheezes. She has no rales. She exhibits no tenderness.  Abdominal: Soft. Bowel sounds are normal. She exhibits no distension and no mass.  There is tenderness. There is no rebound and no guarding.  Right lower quadrant tenderness palpation, no other focal abdominal tenderness, no fluid wave, or signs of peritonitis  Musculoskeletal: Normal range of motion. She exhibits no edema and no tenderness.  Neurological: She is alert and oriented to person, place, and time.  Skin: Skin is warm and dry.  Psychiatric: She has a normal mood and affect. Her behavior is normal. Judgment and thought content normal.    ED Course  Procedures (including critical care time) Results for orders placed during the hospital encounter of 09/25/13  URINALYSIS, ROUTINE W REFLEX MICROSCOPIC      Result Value Range   Color, Urine YELLOW  YELLOW   APPearance CLEAR  CLEAR   Specific Gravity, Urine 1.015  1.005 - 1.030   pH 7.0  5.0 - 8.0   Glucose, UA NEGATIVE  NEGATIVE mg/dL   Hgb urine dipstick NEGATIVE  NEGATIVE   Bilirubin Urine NEGATIVE  NEGATIVE   Ketones, ur NEGATIVE  NEGATIVE mg/dL   Protein, ur NEGATIVE  NEGATIVE mg/dL   Urobilinogen, UA 0.2  0.0 - 1.0 mg/dL   Nitrite NEGATIVE  NEGATIVE   Leukocytes, UA NEGATIVE  NEGATIVE  CBC WITH DIFFERENTIAL      Result Value Range   WBC 4.1  4.0 - 10.5 K/uL   RBC 4.27  3.87 - 5.11 MIL/uL   Hemoglobin 12.3  12.0 - 15.0 g/dL   HCT 95.6  21.3 - 08.6 %   MCV 84.5  78.0 - 100.0 fL   MCH 28.8  26.0 - 34.0 pg   MCHC 34.1  30.0 - 36.0 g/dL   RDW 57.8  46.9 - 62.9 %   Platelets 172  150 - 400 K/uL   Neutrophils Relative % 74  43 - 77 %   Neutro Abs 3.0  1.7 - 7.7 K/uL   Lymphocytes Relative 9 (*) 12 - 46 %   Lymphs Abs 0.4 (*) 0.7 - 4.0 K/uL   Monocytes Relative 17 (*) 3 - 12 %   Monocytes Absolute 0.7  0.1 - 1.0 K/uL   Eosinophils Relative 0  0 - 5 %   Eosinophils Absolute 0.0  0.0 - 0.7 K/uL   Basophils Relative 1  0 - 1 %   Basophils Absolute 0.0  0.0 - 0.1 K/uL  COMPREHENSIVE METABOLIC PANEL      Result Value Range   Sodium 131 (*) 135 - 145 mEq/L   Potassium 2.7 (*) 3.5 - 5.1 mEq/L   Chloride  91 (*) 96 - 112 mEq/L   CO2 24  19 - 32 mEq/L   Glucose, Bld 122 (*) 70 - 99 mg/dL   BUN 11  6 - 23 mg/dL   Creatinine, Ser 5.28  0.50 - 1.10 mg/dL   Calcium 9.2  8.4 - 41.3 mg/dL   Total Protein 7.2  6.0 - 8.3 g/dL   Albumin 3.3 (*) 3.5 - 5.2 g/dL   AST 24  0 - 37 U/L   ALT 12  0 - 35 U/L   Alkaline Phosphatase 57  39 - 117 U/L   Total Bilirubin  0.3  0.3 - 1.2 mg/dL   GFR calc non Af Amer 76 (*) >90 mL/min   GFR calc Af Amer 88 (*) >90 mL/min  POCT I-STAT TROPONIN I      Result Value Range   Troponin i, poc 0.01  0.00 - 0.08 ng/mL   Comment 3            Dg Chest 2 View  09/25/2013   CLINICAL DATA:  Fever with cough and congestion as well as headache and body aches for 2 weeks.  EXAM: CHEST  2 VIEW  COMPARISON:  03/07/2013  FINDINGS: The heart size and mediastinal contours are within normal limits. Both lungs are clear. The remainder of the exam is unchanged.  IMPRESSION: No active cardiopulmonary disease.   Electronically Signed   By: Elberta Fortis M.D.   On: 09/25/2013 10:53   Ct Abdomen Pelvis W Contrast  09/25/2013   CLINICAL DATA:  Fever.  Right lower quadrant pain.  EXAM: CT ABDOMEN AND PELVIS WITH CONTRAST  TECHNIQUE: Multidetector CT imaging of the abdomen and pelvis was performed using the standard protocol following bolus administration of intravenous contrast.  CONTRAST:  50mL OMNIPAQUE IOHEXOL 300 MG/ML SOLN, 80mL OMNIPAQUE IOHEXOL 300 MG/ML SOLN  COMPARISON:  CT 03/05/2012.  FINDINGS: Liver normal. Spleen normal. Pancreas normal. Gallbladder is nondistended. No biliary distention. Hepatic veins, portal vein, splenic vein patent.  Adrenals normal. Right nephrectomy. Right renal bed is unremarkable. Left kidney is unremarkable. No hydronephrosis. No evidence of obstructing ureteral stone. Bladder is nondistended. Hysterectomy. No adnexal mass. No free fluid.  No significant adenopathy. Shotty retroperitoneal lymph nodes are noted. These are nonspecific. Aortoiliac and visceral  vascular calcification consistent with atherosclerotic vascular disease. Left renal origin atherosclerotic vascular disease is noted. Given that the left kidney is the patient's remaining kidney further evaluation of the left renal artery should be considered. Left renal vein is patent.  Appendix is not identified. Several slightly distended loops of small and large bowel are noted. This is a nonspecific finding. Mild soft tissue prominence in the region of the ileocecal region. A cecal mass cannot be excluded. Although these changes may be related to stool, inflammatory, infectious, or malignant etiologies could present in this fashion and colonoscopy suggested for further evaluation. No free air is identified. Circumferential soft tissue thickening noted in the region the gastric antrum. Endoscopic evaluation is suggested as inflammatory and/or malignant changes could present in this fashion. Distal esophagus is normal.  Heart size is normal. Abdominal wall is intact. Lung bases are clear. Degenerative changes lumbar spine and both hips. Degenerative changes of the SI joints.  IMPRESSION: 1. Circumferential gastric antral wall thickening. Antritis or gastric malignancy could present in this fashion and endoscopic evaluation is suggested for further evaluation. Soft tissue thickening in the ileocecal region. Colonoscopy suggested for further evaluation of this region. 2. Right nephrectomy.  No evidence of metastatic disease. 3. Aortoiliac and visceral atherosclerotic vascular disease. There appears to be a left renal artery origin stenosis. Given that this is the patient's only kidney further evaluation of the left renal artery is suggested.   Electronically Signed   By: Maisie Fus  Register   On: 09/25/2013 13:03       EKG Interpretation   None       MDM   1. Hypokalemia     Patient with cough, and myalgias, generally feeling poor. Will check labs, chest x-ray. Also, the patient states that she has  some right lower quadrant tenderness. She  has also had a right nephrectomy. Will order CT scan for further evaluation, but labs first to check creatinine.  Patient discussed with Dr. Effie Shy.  Patient will be admitted by medicine for hypokalemia.    Roxy Horseman, PA-C 09/25/13 [phone]

## 2013-09-25 NOTE — ED Provider Notes (Signed)
Medical screening examination/treatment/procedure(s) were performed by non-physician practitioner and as supervising physician I was immediately available for consultation/collaboration.  Kniyah Khun L Kayson Bullis, MD 09/25/13 1758 

## 2013-09-25 NOTE — H&P (Signed)
History and Physical   Theresa Cline:[id number] DOB: 09/24/1951 DOA: 09/25/2013  Referring physician: Dr. Effie Shy PCP: Georgianne Fick, MD  Specialists: none  Chief Complaint: cough/SOB/diarrhea  HPI: Theresa Cline is a 62 y.o. female has a past medical history significant for DM, CAD s/p MI and stenting followed by [facility], chronic pain, remote R nephrectomy due to cancer presents to the ED with cold-like symptoms for the past 2 weeks worsening in the past 2 days. She saw her PCP about 2 weeks ago when she was started on Amoxicillin. She appreciated no improvement and in fact about 2-3 days ago she started having profuse diarrhea and poor po intake due to loss of appetite. Endorses subjective fever/chills/myalgias for the past 3 days as well. Also has a cough for the past 2 weeks. In the ED blood work was remarkable for K of 2.7, CXR without acute findings.   Review of Systems: as per HPI otherwise negative.   Past Medical History  Diagnosis Date  . S/P carpal tunnel release   . Vertigo   . Tobacco abuse   . Encounter for long-term (current) use of other medications   . Osteoporosis   . Left acoustic neuroma     Hx of  . Diabetic peripheral neuropathy   . Mixed hyperlipidemia   . Primary osteoarthritis of both knees   . Chronic pain syndrome   . Obesity   . Hypertension   . CAD (coronary artery disease)   . Diabetes mellitus type II, controlled   . Bronchitis, acute   . Diabetes type 2, controlled   . Cancer     Renal s/p nephrectomy   Past Surgical History  Procedure Laterality Date  . Resection of left acoustic neuroma  1999  . Coronary angioplasty with stent placement  2004    Normal LV function  . Total abdominal hysterectomy w/ bilateral salpingoophorectomy  04/12/1988    For fibroid tumors; [place], [place]  . Carpal tunnel release  08/09/1998    Right  . Carpal tunnel release  09/08/1998    Left  . Removal lipoma      Right foot  .  Cesarean section      x3   Social History:  reports that she has been smoking Cigarettes.  She has a 40 pack-year smoking history. She has never used smokeless tobacco. She reports that she does not drink alcohol or use illicit drugs.  Allergies  Allergen Reactions  . Gabapentin Swelling  . Adhesive [Tape] Rash    Family History  Problem Relation Age of Onset  . Leukemia Mother     CLL  . Diabetes type II Mother   . Other Mother     Hypercholesterolemia  . Osteoarthritis Mother   . COPD Mother   . Hypertension Mother   . Hodgkin's lymphoma Father   . Diabetes type II Sister   . Osteoporosis Sister   . Paranoid behavior Brother     Unsure if diagnosed with Schizophrenia  . Diabetes type II Sister   . Hypertension Sister   . Diabetes type II Sister   . Hypertension Sister   . Schizophrenia Sister   . Heart disease Sister     Enlarged heart  . Glaucoma Sister   . Alcohol abuse Sister   . Glaucoma Sister   . Alcohol abuse Sister   . Bipolar disorder Daughter   . Lupus Daughter     SLE   Prior to Admission medications  Medication Sig Start Date End Date Taking? Authorizing Provider  alendronate (FOSAMAX) 70 MG tablet Take 70 mg by mouth every Sunday. Take with a full glass of water on an empty stomach.   Yes Historical Provider, MD  amLODipine (NORVASC) 5 MG tablet Take 5 mg by mouth daily.   Yes Historical Provider, MD  amoxicillin (AMOXIL) 500 MG capsule Take 500 mg by mouth 3 (three) times daily. 09/11/13  Yes Historical Provider, MD  aspirin EC 81 MG tablet Take 81 mg by mouth daily.   Yes Historical Provider, MD  atorvastatin (LIPITOR) 40 MG tablet Take 40 mg by mouth daily.  08/19/12  Yes Historical Provider, MD  citalopram (CELEXA) 40 MG tablet Take 60 mg by mouth daily. **Needs office visit.**   Yes Historical Provider, MD  clonazePAM (KLONOPIN) 0.5 MG tablet Take 0.5 mg by mouth at bedtime.   Yes Historical Provider, MD  fish oil-omega-3 fatty acids 1000 MG  capsule Take 1 g by mouth daily.     Yes Historical Provider, MD  fluticasone (FLONASE) 50 MCG/ACT nasal spray Place 1 spray into both nostrils daily as needed for allergies or rhinitis.   Yes Historical Provider, MD  furosemide (LASIX) 20 MG tablet Take 10 mg by mouth daily.   Yes Historical Provider, MD  guaiFENesin-dextromethorphan (ROBITUSSIN DM) 100-10 MG/5ML syrup Take 5 mLs by mouth every 4 (four) hours as needed for cough.   Yes Historical Provider, MD  metFORMIN (GLUCOPHAGE) 1000 MG tablet Take 1,000 mg by mouth 2 (two) times daily with a meal.     Yes Historical Provider, MD  metoprolol (TOPROL-XL) 100 MG 24 hr tablet Take 100 mg by mouth daily.     Yes Historical Provider, MD  nitroGLYCERIN (NITROLINGUAL) 0.4 MG/SPRAY spray Place 1 spray under the tongue every 5 (five) minutes x 3 doses as needed for chest pain.   Yes Historical Provider, MD  omeprazole (PRILOSEC) 40 MG capsule Take 40 mg by mouth daily.     Yes Historical Provider, MD  traMADol (ULTRAM) 50 MG tablet Take 50 mg by mouth every 8 (eight) hours as needed for moderate pain.   Yes Historical Provider, MD  valsartan-hydrochlorothiazide (DIOVAN-HCT) 160-12.5 MG per tablet Take 1 tablet by mouth 2 (two) times daily.     Yes Historical Provider, MD  vitamin E 400 UNIT capsule Take 400 Units by mouth daily.   Yes Historical Provider, MD   Physical Exam: Filed Vitals:   09/25/13 1300 09/25/13 1433 09/25/13 1446 09/25/13 1451  BP:  189/97 195/78   Pulse: 74 73 76 79  Temp:      TempSrc:      Resp: 15     SpO2: 98% 95% 98% 96%     General:  No apparent distress  Eyes: PERRL, EOMI, no scleral icterus  ENT: moist oropharynx  Neck: supple, no JVD  Cardiovascular: regular rate without MRG; 2+ peripheral pulses  Respiratory: decreased breath sounds overall, no wheezing  Abdomen: soft, non tender to palpation, positive bowel sounds, no guarding, no rebound  Skin: no rashes   Musculoskeletal: no peripheral edema    Psychiatric: normal mood and affect   Neurologic: non focal  Labs on Admission:  Basic Metabolic Panel:  Recent Labs Lab 09/25/13 1050  NA 131*  K 2.7*  CL 91*  CO2 24  GLUCOSE 122*  BUN 11  CREATININE 0.80  CALCIUM 9.2   Liver Function Tests:  Recent Labs Lab 09/25/13 1050  AST 24  ALT 12  ALKPHOS 57  BILITOT 0.3  PROT 7.2  ALBUMIN 3.3*   CBC:  Recent Labs Lab 09/25/13 1050  WBC 4.1  NEUTROABS 3.0  HGB 12.3  HCT 36.1  MCV 84.5  PLT 172   BNP (last 3 results)  Recent Labs  10/05/12 1110  PROBNP 77.6   Radiological Exams on Admission: Dg Chest 2 View  09/25/2013   CLINICAL DATA:  Fever with cough and congestion as well as headache and body aches for 2 weeks.  EXAM: CHEST  2 VIEW  COMPARISON:  03/07/2013  FINDINGS: The heart size and mediastinal contours are within normal limits. Both lungs are clear. The remainder of the exam is unchanged.  IMPRESSION: No active cardiopulmonary disease.   Electronically Signed   By: Elberta Fortis M.D.   On: 09/25/2013 10:53   Ct Abdomen Pelvis W Contrast  09/25/2013   CLINICAL DATA:  Fever.  Right lower quadrant pain.  EXAM: CT ABDOMEN AND PELVIS WITH CONTRAST  TECHNIQUE: Multidetector CT imaging of the abdomen and pelvis was performed using the standard protocol following bolus administration of intravenous contrast.  CONTRAST:  50mL OMNIPAQUE IOHEXOL 300 MG/ML SOLN, 80mL OMNIPAQUE IOHEXOL 300 MG/ML SOLN  COMPARISON:  CT 03/05/2012.  FINDINGS: Liver normal. Spleen normal. Pancreas normal. Gallbladder is nondistended. No biliary distention. Hepatic veins, portal vein, splenic vein patent.  Adrenals normal. Right nephrectomy. Right renal bed is unremarkable. Left kidney is unremarkable. No hydronephrosis. No evidence of obstructing ureteral stone. Bladder is nondistended. Hysterectomy. No adnexal mass. No free fluid.  No significant adenopathy. Shotty retroperitoneal lymph nodes are noted. These are nonspecific. Aortoiliac  and visceral vascular calcification consistent with atherosclerotic vascular disease. Left renal origin atherosclerotic vascular disease is noted. Given that the left kidney is the patient's remaining kidney further evaluation of the left renal artery should be considered. Left renal vein is patent.  Appendix is not identified. Several slightly distended loops of small and large bowel are noted. This is a nonspecific finding. Mild soft tissue prominence in the region of the ileocecal region. A cecal mass cannot be excluded. Although these changes may be related to stool, inflammatory, infectious, or malignant etiologies could present in this fashion and colonoscopy suggested for further evaluation. No free air is identified. Circumferential soft tissue thickening noted in the region the gastric antrum. Endoscopic evaluation is suggested as inflammatory and/or malignant changes could present in this fashion. Distal esophagus is normal.  Heart size is normal. Abdominal wall is intact. Lung bases are clear. Degenerative changes lumbar spine and both hips. Degenerative changes of the SI joints.  IMPRESSION: 1. Circumferential gastric antral wall thickening. Antritis or gastric malignancy could present in this fashion and endoscopic evaluation is suggested for further evaluation. Soft tissue thickening in the ileocecal region. Colonoscopy suggested for further evaluation of this region. 2. Right nephrectomy.  No evidence of metastatic disease. 3. Aortoiliac and visceral atherosclerotic vascular disease. There appears to be a left renal artery origin stenosis. Given that this is the patient's only kidney further evaluation of the left renal artery is suggested.   Electronically Signed   By: Maisie Fus  Register   On: 09/25/2013 13:03    EKG: Independently reviewed.  Assessment/Plan Principal Problem:   BRONCHITIS, ACUTE Active Problems:   DIABETES MELLITUS, TYPE II, CONTROLLED   DIABETIC PERIPHERAL NEUROPATHY    Mixed hyperlipidemia   OBESITY   TOBACCO ABUSE   Chronic pain syndrome   HYPERTENSION   CAD   Hypokalemia   Diarrhea   Generalized  weakness - will test for flu given myalgias. Does have sick contacts with flu-like symptoms.  Cough with sputum production - patient not with a COPD diagnosis but smoked all her life. I suspect underlying COPD may benefit from PFTs as an outpatient. Duonebs, Levofloxacin. No wheezing and will hold steroids.  Tobacco abuse - counseled for cessation.  CAD - seems stable, no chest pain.  Diarrhea - IV hydration, will check for C diff given recent Abx as an outpatient.  Hypokalemia - due to diarrhea, replete and recheck.  HTN CT scan with gastric antral wall thickening - she will likely benefit from GI evaluation in the future. CT findings were discussed with the patient and recommended a GI referral once her acute issues resolve and likely as an outpatient.   Diet: diabetic Fluids: NS DVT Prophylaxis: Lovenox  Code Status: Full  Family Communication: daughter  Disposition Plan: inpatient  Time spent: 40  Costin M. Elvera Lennox, MD [facility] Pager [phone]  If 7PM-7AM, please contact night-coverage [email or web address] Password [email or web address] 09/25/2013, 4:52 PM

## 2013-09-26 DIAGNOSIS — I10 Essential (primary) hypertension: Secondary | ICD-10-CM

## 2013-09-26 DIAGNOSIS — J209 Acute bronchitis, unspecified: Secondary | ICD-10-CM

## 2013-09-26 DIAGNOSIS — J09X2 Influenza due to identified novel influenza A virus with other respiratory manifestations: Principal | ICD-10-CM | POA: Diagnosis present

## 2013-09-26 LAB — GLUCOSE, CAPILLARY
Glucose-Capillary: 75 mg/dL (ref 70–99)
Glucose-Capillary: 308 mg/dL — ABNORMAL HIGH (ref 70–99)
Glucose-Capillary: 78 mg/dL (ref 70–99)

## 2013-09-26 LAB — BASIC METABOLIC PANEL
BUN: 10 mg/dL (ref 6–23)
CO2: 22 mEq/L (ref 19–32)
Chloride: 98 mEq/L (ref 96–112)
GFR calc Af Amer: 85 mL/min — ABNORMAL LOW (ref >90)
GFR calc non Af Amer: 73 mL/min — ABNORMAL LOW (ref >90)
Glucose, Bld: 104 mg/dL — ABNORMAL HIGH (ref 70–99)
Potassium: 3.1 mEq/L — ABNORMAL LOW (ref 3.5–5.1)
Sodium: 134 mEq/L — ABNORMAL LOW (ref 135–145)

## 2013-09-26 LAB — CBC
HCT: 35.9 % — ABNORMAL LOW (ref 36.0–46.0)
Hemoglobin: 12.1 g/dL (ref 12.0–15.0)
RBC: 4.22 MIL/uL (ref 3.87–5.11)
RDW: 13.9 % (ref 11.5–15.5)
WBC: 3.4 10*3/uL — ABNORMAL LOW (ref 4.0–10.5)

## 2013-09-26 LAB — INFLUENZA PANEL BY PCR (TYPE A & B)
H1N1 flu by pcr: NOT DETECTED
Influenza A By PCR: POSITIVE — AB

## 2013-09-26 MED ORDER — OSELTAMIVIR PHOSPHATE 75 MG PO CAPS
75.0000 mg | ORAL_CAPSULE | Freq: Two times a day (BID) | ORAL | Status: DC
  Administered 2013-09-26 – 2013-09-28 (×5): 75 mg via ORAL
  Filled 2013-09-26 (×6): qty 1

## 2013-09-26 MED ORDER — CITALOPRAM HYDROBROMIDE 40 MG PO TABS
40.0000 mg | ORAL_TABLET | Freq: Every day | ORAL | Status: DC
  Administered 2013-09-27 – 2013-09-28 (×2): 40 mg via ORAL
  Filled 2013-09-26 (×2): qty 1

## 2013-09-26 MED ORDER — POTASSIUM CHLORIDE CRYS ER 20 MEQ PO TBCR
40.0000 meq | EXTENDED_RELEASE_TABLET | Freq: Once | ORAL | Status: AC
  Administered 2013-09-26: 40 meq via ORAL
  Filled 2013-09-26: qty 2

## 2013-09-26 NOTE — Progress Notes (Signed)
UR completed. Patient changed to inpatient r/t requiring IV antibiotics and IVF 

## 2013-09-26 NOTE — Progress Notes (Signed)
[facility] PROGRESS NOTE  Theresa Cline:[id number] DOB: December 31, 1950 DOA: 09/25/2013  PCP: Georgianne Fick, MD  Brief HPI: Theresa Cline is a 62 y.o. female has a past medical history significant for DM, CAD s/p MI and stenting followed by [facility] cardiology, chronic pain, remote R nephrectomy due to cancer who presented to the ED with cold-like symptoms for the past 2 weeks worsening in the past 2 days. She saw her PCP about 2 weeks ago when she was started on Amoxicillin. She appreciated no improvement and in fact about 2-3 days ago she started having profuse diarrhea and poor po intake due to loss of appetite. Endorses subjective fever/chills/myalgias for the past 3 days as well.   Past medical history:  Past Medical History  Diagnosis Date  . S/P carpal tunnel release   . Vertigo   . Tobacco abuse   . Encounter for long-term (current) use of other medications   . Osteoporosis   . Left acoustic neuroma     Hx of  . Diabetic peripheral neuropathy   . Mixed hyperlipidemia   . Primary osteoarthritis of both knees   . Chronic pain syndrome   . Obesity   . Hypertension   . CAD (coronary artery disease)   . Diabetes mellitus type II, controlled   . Bronchitis, acute   . Diabetes type 2, controlled   . Cancer     Renal s/p nephrectomy    Consultants: None  Procedures: None  Antibiotics: Levaquin Tamiflu  Subjective: Patient feels slightly better. Two episodes of diarrhea since last night. Has a dry cough. Generalized weakness and body aches.  Objective: Vital Signs  Filed Vitals:   09/25/13 1848 09/25/13 1857 09/25/13 2200 09/26/13 0600  BP: 194/110 180/101 136/77 154/84  Pulse: 94 92 84 88  Temp:   99.2 F (37.3 C) 99.1 F (37.3 C)  TempSrc:   Oral Oral  Resp:   18 18  Height:      Weight:      SpO2:   97% 96%   No intake or output data in the 24 hours ending 09/26/13 1119 Filed Weights   09/25/13 1708  Weight: 62.8 kg (138 lb 7.2 oz)     General appearance: alert, cooperative, appears stated age and no distress Resp: Coarse breath sounds bilaterally without wheezing. No crackles. Cardio: regular rate and rhythm, S1, S2 normal, no murmur, click, rub or gallop GI: soft, non-tender; bowel sounds normal; no masses,  no organomegaly Extremities: extremities normal, atraumatic, no cyanosis or edema Neurologic: No focal deficits.  Lab Results:  Basic Metabolic Panel:  Recent Labs Lab 09/25/13 1050 09/26/13 0505  NA 131* 134*  K 2.7* 3.1*  CL 91* 98  CO2 24 22  GLUCOSE 122* 104*  BUN 11 10  CREATININE 0.80 0.83  CALCIUM 9.2 8.8   Liver Function Tests:  Recent Labs Lab 09/25/13 1050  AST 24  ALT 12  ALKPHOS 57  BILITOT 0.3  PROT 7.2  ALBUMIN 3.3*   CBC:  Recent Labs Lab 09/25/13 1050 09/26/13 0505  WBC 4.1 3.4*  NEUTROABS 3.0  --   HGB 12.3 12.1  HCT 36.1 35.9*  MCV 84.5 85.1  PLT 172 133*   BNP (last 3 results)  Recent Labs  10/05/12 1110  PROBNP 77.6   CBG:  Recent Labs Lab 09/25/13 1650 09/25/13 2106 09/26/13 0746  GLUCAP 92 105* 75     Studies/Results: Dg Chest 2 View  09/25/2013   CLINICAL DATA:  Fever with cough and congestion as well as headache and body aches for 2 weeks.  EXAM: CHEST  2 VIEW  COMPARISON:  03/07/2013  FINDINGS: The heart size and mediastinal contours are within normal limits. Both lungs are clear. The remainder of the exam is unchanged.  IMPRESSION: No active cardiopulmonary disease.   Electronically Signed   By: Elberta Fortis M.D.   On: 09/25/2013 10:53   Ct Abdomen Pelvis W Contrast  09/25/2013   CLINICAL DATA:  Fever.  Right lower quadrant pain.  EXAM: CT ABDOMEN AND PELVIS WITH CONTRAST  TECHNIQUE: Multidetector CT imaging of the abdomen and pelvis was performed using the standard protocol following bolus administration of intravenous contrast.  CONTRAST:  50mL OMNIPAQUE IOHEXOL 300 MG/ML SOLN, 80mL OMNIPAQUE IOHEXOL 300 MG/ML SOLN  COMPARISON:  CT  03/05/2012.  FINDINGS: Liver normal. Spleen normal. Pancreas normal. Gallbladder is nondistended. No biliary distention. Hepatic veins, portal vein, splenic vein patent.  Adrenals normal. Right nephrectomy. Right renal bed is unremarkable. Left kidney is unremarkable. No hydronephrosis. No evidence of obstructing ureteral stone. Bladder is nondistended. Hysterectomy. No adnexal mass. No free fluid.  No significant adenopathy. Shotty retroperitoneal lymph nodes are noted. These are nonspecific. Aortoiliac and visceral vascular calcification consistent with atherosclerotic vascular disease. Left renal origin atherosclerotic vascular disease is noted. Given that the left kidney is the patient's remaining kidney further evaluation of the left renal artery should be considered. Left renal vein is patent.  Appendix is not identified. Several slightly distended loops of small and large bowel are noted. This is a nonspecific finding. Mild soft tissue prominence in the region of the ileocecal region. A cecal mass cannot be excluded. Although these changes may be related to stool, inflammatory, infectious, or malignant etiologies could present in this fashion and colonoscopy suggested for further evaluation. No free air is identified. Circumferential soft tissue thickening noted in the region the gastric antrum. Endoscopic evaluation is suggested as inflammatory and/or malignant changes could present in this fashion. Distal esophagus is normal.  Heart size is normal. Abdominal wall is intact. Lung bases are clear. Degenerative changes lumbar spine and both hips. Degenerative changes of the SI joints.  IMPRESSION: 1. Circumferential gastric antral wall thickening. Antritis or gastric malignancy could present in this fashion and endoscopic evaluation is suggested for further evaluation. Soft tissue thickening in the ileocecal region. Colonoscopy suggested for further evaluation of this region. 2. Right nephrectomy.  No evidence  of metastatic disease. 3. Aortoiliac and visceral atherosclerotic vascular disease. There appears to be a left renal artery origin stenosis. Given that this is the patient's only kidney further evaluation of the left renal artery is suggested.   Electronically Signed   By: Maisie Fus  Register   On: 09/25/2013 13:03    Medications:  Scheduled: . amLODipine  5 mg Oral Daily  . aspirin EC  81 mg Oral Daily  . atorvastatin  40 mg Oral Daily  . citalopram  60 mg Oral Daily  . clonazePAM  0.5 mg Oral QHS  . enoxaparin (LOVENOX) injection  40 mg Subcutaneous Q24H  . irbesartan  150 mg Oral BID   And  . hydrochlorothiazide  12.5 mg Oral BID  . insulin aspart  0-9 Units Subcutaneous TID WC  . levofloxacin (LEVAQUIN) IV  500 mg Intravenous Q24H  . metoprolol succinate  100 mg Oral Daily  . oseltamivir  75 mg Oral BID  . potassium chloride  40 mEq Oral Once  . sodium chloride  3 mL Intravenous  Q12H  . vitamin E  400 Units Oral Daily   Continuous: . sodium chloride 75 mL/hr at 09/25/13 1719   Cline:WJXBJYNWG, fluticasone, guaiFENesin-dextromethorphan, hydrALAZINE, HYDROcodone-acetaminophen, ipratropium, traMADol  Assessment/Plan:  Principal Problem:   BRONCHITIS, ACUTE Active Problems:   DIABETES MELLITUS, TYPE II, CONTROLLED   DIABETIC PERIPHERAL NEUROPATHY   Mixed hyperlipidemia   OBESITY   TOBACCO ABUSE   Chronic pain syndrome   HYPERTENSION   CAD   Hypokalemia   Diarrhea    Influenza A Start Tamiflu. Other symptoms mostly explained by influenza.   Cough with sputum production Possibly from Influenza but cannot rule out component of bronchitis. Continue Nebs as needed and antibiotics. May benefit from PFT as OP.   Tobacco abuse Counseled for cessation.   History of CAD Seems stable, no chest pain.   Diarrhea Possibly from inlfuenza. C diff is pending.   Hypokalemia Due to diarrhea, replete and recheck. Check Mg.  Benign HTN, Uncontrolled Continue home medications.  Monitor BP closely.  CT scan with gastric antral wall thickening and ileocecal changes Discussed with patient and daughter. This can be further pursued by her PCP. She states she has had colonoscopy before but it may be time for another. Will need GI referral as OP.   Code Status: Full Code  DVT Prophylaxis: Lovenox    Family Communication: Discussed with patient and daughter  Disposition Plan: Home when improved    LOS: 1 day   [facility]  [facility] Pager [phone] 09/26/2013, 11:19 AM  If 8PM-8AM, please contact night-coverage at [email or web address], password [facility]

## 2013-09-27 DIAGNOSIS — R197 Diarrhea, unspecified: Secondary | ICD-10-CM

## 2013-09-27 LAB — GLUCOSE, CAPILLARY
Glucose-Capillary: 70 mg/dL (ref 70–99)
Glucose-Capillary: 184 mg/dL — ABNORMAL HIGH (ref 70–99)
Glucose-Capillary: 103 mg/dL — ABNORMAL HIGH (ref 70–99)
Glucose-Capillary: 92 mg/dL (ref 70–99)

## 2013-09-27 LAB — CBC
Hemoglobin: 12.3 g/dL (ref 12.0–15.0)
MCH: 28.5 pg (ref 26.0–34.0)
MCV: 86.3 fL (ref 78.0–100.0)
Platelets: 144 10*3/uL — ABNORMAL LOW (ref 150–400)
RBC: 4.31 MIL/uL (ref 3.87–5.11)
RDW: 13.9 % (ref 11.5–15.5)
WBC: 3.5 10*3/uL — ABNORMAL LOW (ref 4.0–10.5)

## 2013-09-27 LAB — BASIC METABOLIC PANEL
CO2: 27 mEq/L (ref 19–32)
Calcium: 9.2 mg/dL (ref 8.4–10.5)
Creatinine, Ser: 0.98 mg/dL (ref 0.50–1.10)
Glucose, Bld: 79 mg/dL (ref 70–99)
Potassium: 3.6 mEq/L (ref 3.5–5.1)
Sodium: 136 mEq/L (ref 135–145)

## 2013-09-27 MED ORDER — POTASSIUM CHLORIDE CRYS ER 20 MEQ PO TBCR
40.0000 meq | EXTENDED_RELEASE_TABLET | Freq: Once | ORAL | Status: AC
  Administered 2013-09-27: 40 meq via ORAL
  Filled 2013-09-27: qty 2

## 2013-09-27 MED ORDER — LEVOFLOXACIN 500 MG PO TABS
500.0000 mg | ORAL_TABLET | ORAL | Status: DC
  Administered 2013-09-27: 500 mg via ORAL
  Filled 2013-09-27 (×2): qty 1

## 2013-09-27 NOTE — Progress Notes (Addendum)
[facility] PROGRESS NOTE  Theresa Cline ZOX:[id number] DOB: [birth year] DOA: 09/25/2013  PCP: Georgianne Fick, MD  Brief HPI: Theresa Cline is a 62 y.o. female has a past medical history significant for DM, CAD s/p MI and stenting followed by [facility] cardiology, chronic pain, remote R nephrectomy due to cancer who presented to the ED with cold-like symptoms for the past 2 weeks worsening in the past 2 days. She saw her PCP about 2 weeks ago when she was started on Amoxicillin. She appreciated no improvement and in fact about 2-3 days ago she started having profuse diarrhea and poor po intake due to loss of appetite. Endorses subjective fever/chills/myalgias for the past 3 days as well.   Past medical history:  Past Medical History  Diagnosis Date  . S/P carpal tunnel release   . Vertigo   . Tobacco abuse   . Encounter for long-term (current) use of other medications   . Osteoporosis   . Left acoustic neuroma     Hx of  . Diabetic peripheral neuropathy   . Mixed hyperlipidemia   . Primary osteoarthritis of both knees   . Chronic pain syndrome   . Obesity   . Hypertension   . CAD (coronary artery disease)   . Diabetes mellitus type II, controlled   . Bronchitis, acute   . Diabetes type 2, controlled   . Cancer     Renal s/p nephrectomy    Consultants: None  Procedures: None  Antibiotics: Levaquin Tamiflu  Subjective: Patient feels better. No significant episodes of diarrhea. No nausea or vomiting. Cough and breathing is better. Still has slight headache.   Objective: Vital Signs  Filed Vitals:   09/26/13 0600 09/26/13 1352 09/26/13 2128 09/27/13 0612  BP: 154/84 130/66 150/87 147/64  Pulse: 88 61 63 64  Temp: 99.1 F (37.3 C) 98.4 F (36.9 C) 98.6 F (37 C) 97.5 F (36.4 C)  TempSrc: Oral Oral Oral Oral  Resp: 18 18  18   Height:      Weight:      SpO2: 96% 98% 99% 97%    Intake/Output Summary (Last 24 hours) at 09/27/13 0946 Last data  filed at 09/27/13 0815  Gross per 24 hour  Intake  912.5 ml  Output   1750 ml  Net -837.5 ml   Filed Weights   09/25/13 1708  Weight: 62.8 kg (138 lb 7.2 oz)    General appearance: alert, cooperative, appears stated age and no distress Resp: Coarse breath sounds bilaterally without wheezing. No crackles. Cardio: regular rate and rhythm, S1, S2 normal, no murmur, click, rub or gallop GI: soft, non-tender; bowel sounds normal; no masses,  no organomegaly Extremities: extremities normal, atraumatic, no cyanosis or edema Neurologic: No focal deficits.  Lab Results:  Basic Metabolic Panel:  Recent Labs Lab 09/25/13 1050 09/26/13 0505 09/26/13 0535 09/27/13 0556  NA 131* 134*  --  136  K 2.7* 3.1*  --  3.6  CL 91* 98  --  99  CO2 24 22  --  27  GLUCOSE 122* 104*  --  79  BUN 11 10  --  10  CREATININE 0.80 0.83  --  0.98  CALCIUM 9.2 8.8  --  9.2  MG  --   --  1.6  --    Liver Function Tests:  Recent Labs Lab 09/25/13 1050  AST 24  ALT 12  ALKPHOS 57  BILITOT 0.3  PROT 7.2  ALBUMIN 3.3*   CBC:  Recent  Labs Lab 09/25/13 1050 09/26/13 0505 09/27/13 0556  WBC 4.1 3.4* 3.5*  NEUTROABS 3.0  --   --   HGB 12.3 12.1 12.3  HCT 36.1 35.9* 37.2  MCV 84.5 85.1 86.3  PLT 172 133* 144*   BNP (last 3 results)  Recent Labs  10/05/12 1110  PROBNP 77.6   CBG:  Recent Labs Lab 09/26/13 0746 09/26/13 1138 09/26/13 1712 09/26/13 2140 09/27/13 0805  GLUCAP 75 308* 78 91 70     Studies/Results: Dg Chest 2 View  09/25/2013   CLINICAL DATA:  Fever with cough and congestion as well as headache and body aches for 2 weeks.  EXAM: CHEST  2 VIEW  COMPARISON:  03/07/2013  FINDINGS: The heart size and mediastinal contours are within normal limits. Both lungs are clear. The remainder of the exam is unchanged.  IMPRESSION: No active cardiopulmonary disease.   Electronically Signed   By: Elberta Fortis M.D.   On: 09/25/2013 10:53   Ct Abdomen Pelvis W  Contrast  09/25/2013   CLINICAL DATA:  Fever.  Right lower quadrant pain.  EXAM: CT ABDOMEN AND PELVIS WITH CONTRAST  TECHNIQUE: Multidetector CT imaging of the abdomen and pelvis was performed using the standard protocol following bolus administration of intravenous contrast.  CONTRAST:  50mL OMNIPAQUE IOHEXOL 300 MG/ML SOLN, 80mL OMNIPAQUE IOHEXOL 300 MG/ML SOLN  COMPARISON:  CT 03/05/2012.  FINDINGS: Liver normal. Spleen normal. Pancreas normal. Gallbladder is nondistended. No biliary distention. Hepatic veins, portal vein, splenic vein patent.  Adrenals normal. Right nephrectomy. Right renal bed is unremarkable. Left kidney is unremarkable. No hydronephrosis. No evidence of obstructing ureteral stone. Bladder is nondistended. Hysterectomy. No adnexal mass. No free fluid.  No significant adenopathy. Shotty retroperitoneal lymph nodes are noted. These are nonspecific. Aortoiliac and visceral vascular calcification consistent with atherosclerotic vascular disease. Left renal origin atherosclerotic vascular disease is noted. Given that the left kidney is the patient's remaining kidney further evaluation of the left renal artery should be considered. Left renal vein is patent.  Appendix is not identified. Several slightly distended loops of small and large bowel are noted. This is a nonspecific finding. Mild soft tissue prominence in the region of the ileocecal region. A cecal mass cannot be excluded. Although these changes may be related to stool, inflammatory, infectious, or malignant etiologies could present in this fashion and colonoscopy suggested for further evaluation. No free air is identified. Circumferential soft tissue thickening noted in the region the gastric antrum. Endoscopic evaluation is suggested as inflammatory and/or malignant changes could present in this fashion. Distal esophagus is normal.  Heart size is normal. Abdominal wall is intact. Lung bases are clear. Degenerative changes lumbar spine  and both hips. Degenerative changes of the SI joints.  IMPRESSION: 1. Circumferential gastric antral wall thickening. Antritis or gastric malignancy could present in this fashion and endoscopic evaluation is suggested for further evaluation. Soft tissue thickening in the ileocecal region. Colonoscopy suggested for further evaluation of this region. 2. Right nephrectomy.  No evidence of metastatic disease. 3. Aortoiliac and visceral atherosclerotic vascular disease. There appears to be a left renal artery origin stenosis. Given that this is the patient's only kidney further evaluation of the left renal artery is suggested.   Electronically Signed   By: Maisie Fus  Register   On: 09/25/2013 13:03    Medications:  Scheduled: . amLODipine  5 mg Oral Daily  . aspirin EC  81 mg Oral Daily  . atorvastatin  40 mg Oral Daily  .  citalopram  40 mg Oral Daily  . clonazePAM  0.5 mg Oral QHS  . enoxaparin (LOVENOX) injection  40 mg Subcutaneous Q24H  . irbesartan  150 mg Oral BID   And  . hydrochlorothiazide  12.5 mg Oral BID  . insulin aspart  0-9 Units Subcutaneous TID WC  . levofloxacin  500 mg Oral Daily  . metoprolol succinate  100 mg Oral Daily  . oseltamivir  75 mg Oral BID  . sodium chloride  3 mL Intravenous Q12H  . vitamin E  400 Units Oral Daily   Continuous:   WUJ:WJXBJYNWG, fluticasone, guaiFENesin-dextromethorphan, hydrALAZINE, HYDROcodone-acetaminophen, ipratropium, traMADol  Assessment/Plan:  Principal Problem:   Influenza due to identified novel influenza A virus with other respiratory manifestations Active Problems:   DIABETES MELLITUS, TYPE II, CONTROLLED   DIABETIC PERIPHERAL NEUROPATHY   Mixed hyperlipidemia   OBESITY   TOBACCO ABUSE   Chronic pain syndrome   HYPERTENSION   CAD   BRONCHITIS, ACUTE   Hypokalemia   Diarrhea    Influenza A Continue Tamiflu. Other symptoms mostly explained by influenza.   Cough with sputum production Possibly from Influenza but cannot  rule out component of bronchitis. Continue Nebs as needed and antibiotics. May benefit from PFT as OP. Change to oral Abx.  Tobacco abuse Counseled for cessation.   History of CAD Seems stable, no chest pain.   Diarrhea Possibly from inlfuenza. C diff is pending but unlikely to be.   Hypokalemia Due to diarrhea. Repleted.  Benign HTN, Uncontrolled Continue home medications. Monitor BP closely. BP is better.  History of DM2 Stable.CBG borderline low. Patient encouraged to eat meals. Holding Metformin.  CT scan with gastric antral wall thickening and ileocecal changes Discussed with patient and daughter. This can be further pursued by her PCP. She states she has had colonoscopy before but that it may be time for another. Will need GI referral as OP.   Code Status: Full Code  DVT Prophylaxis: Lovenox    Family Communication: Discussed with patient  Disposition Plan: Home when improved. Anticipate DC in AM. Ambulate in hallway.    LOS: 2 days   [facility]  [facility] Pager [phone] 09/27/2013, 9:46 AM  If 8PM-8AM, please contact night-coverage at [email or web address], password [facility]

## 2013-09-28 LAB — BASIC METABOLIC PANEL
Calcium: 9.4 mg/dL (ref 8.4–10.5)
GFR calc non Af Amer: 67 mL/min — ABNORMAL LOW (ref >90)
Potassium: 3.4 mEq/L — ABNORMAL LOW (ref 3.5–5.1)
Sodium: 137 mEq/L (ref 135–145)

## 2013-09-28 LAB — CBC
Hemoglobin: 12.1 g/dL (ref 12.0–15.0)
MCH: 28.6 pg (ref 26.0–34.0)
MCHC: 33.6 g/dL (ref 30.0–36.0)
Platelets: 147 10*3/uL — ABNORMAL LOW (ref 150–400)
RBC: 4.23 MIL/uL (ref 3.87–5.11)

## 2013-09-28 LAB — GLUCOSE, CAPILLARY: Glucose-Capillary: 83 mg/dL (ref 70–99)

## 2013-09-28 MED ORDER — POTASSIUM CHLORIDE CRYS ER 20 MEQ PO TBCR
40.0000 meq | EXTENDED_RELEASE_TABLET | Freq: Once | ORAL | Status: AC
  Administered 2013-09-28: 40 meq via ORAL
  Filled 2013-09-28: qty 2

## 2013-09-28 MED ORDER — LEVOFLOXACIN 500 MG PO TABS: 500.0000 mg | ORAL_TABLET | ORAL | Status: DC

## 2013-09-28 MED ORDER — OSELTAMIVIR PHOSPHATE 75 MG PO CAPS: 75.0000 mg | ORAL_CAPSULE | Freq: Two times a day (BID) | ORAL | Status: DC

## 2013-09-28 MED ORDER — ALBUTEROL SULFATE HFA 108 (90 BASE) MCG/ACT IN AERS: 2.0000 | INHALATION_SPRAY | Freq: Four times a day (QID) | RESPIRATORY_TRACT | Status: DC | PRN

## 2013-09-28 NOTE — Progress Notes (Signed)
Patient discharged to home. DC instructions given. prescriptions x 3 also given. No concerns voiced. Left unit in wheelchair pushed by nurse tech accompanied by family members. Left in good condition. Theresa Cline

## 2013-09-28 NOTE — Discharge Summary (Signed)
[facility]  Physician Discharge Summary   Patient ID: Theresa Cline MRN: [id number] DOB/AGE: 64-Apr-1952 64 y.o.  Admit date: 09/25/2013 Discharge date: 09/28/2013  PCP: Georgianne Fick, MD  DISCHARGE DIAGNOSES:  Principal Problem:   Influenza due to identified novel influenza A virus with other respiratory manifestations Active Problems:   DIABETES MELLITUS, TYPE II, CONTROLLED   DIABETIC PERIPHERAL NEUROPATHY   Mixed hyperlipidemia   OBESITY   TOBACCO ABUSE   Chronic pain syndrome   HYPERTENSION   CAD   BRONCHITIS, ACUTE   Hypokalemia   Diarrhea   RECOMMENDATIONS FOR OUTPATIENT FOLLOW UP: 1. Follow up with PCP in 1 week. Consider PFT as OP. 2. Please see CT results. Patient may need referral to gastroenterology.  DISCHARGE CONDITION: fair  Diet recommendation: Low Sodium  Filed Weights   09/25/13 1708  Weight: 62.8 kg (138 lb 7.2 oz)    INITIAL HISTORY: Theresa Cline is a 62 y.o. female has a past medical history significant for DM, CAD s/p MI and stenting followed by [facility] cardiology, chronic pain, remote R nephrectomy due to cancer who presented to the ED with cold-like symptoms for the past 2 weeks worsening in the past 2 days. She saw her PCP about 2 weeks ago when she was started on Amoxicillin. She appreciated no improvement and in fact about 2-3 days ago she started having profuse diarrhea and poor po intake due to loss of appetite. Endorses subjective fever/chills/myalgias for the past 3 days as well.   Consultations:  None  Procedures:  None  HOSPITAL COURSE:   Influenza A  Patient tested positive for Influenza A. She was initiated on Tamiflu. Her symptoms have improved.   Cough with sputum production  Possibly from Influenza but cannot rule out component of bronchitis considering her history of smoking. She was given Nebs as needed and antibiotics. May benefit from PFT as OP. She will be discharged on Albuterol Inhaler and  Levaquin for few more days.  Tobacco abuse  Counseled for cessation.   History of CAD  Seems stable, no chest pain.   Diarrhea  Most likely from inlfuenza. C diff is pending but unlikely to be. We will follow results and call in Flagyl if needed. Patient's diarrhea has improved and stool is more solid.  Hypokalemia  Probably due to diarrhea. Will give dose prior to discharge.  Benign HTN, Uncontrolled  Improved after initiation of her anti hypertensives. Continue home medications.  History of DM2  Continue home medications. CBG was low here initially due to poor oral intake. HBA1c was not checked.  CT scan with gastric antral wall thickening and ileocecal changes  Discussed with patient and daughter. This can be further pursued by her PCP. She states she has had colonoscopy before but that it may be time for another. Will need GI referral as OP. She will follow up with her PCP for the same.  Overall patient is improved. She is keen on going home. C diff is pending but we will follow results and call in prescription if needed. Diarrhea is significantly improved.   PERTINENT LABS:  The results of significant diagnostics from this hospitalization (including imaging, microbiology, ancillary and laboratory) are listed below for reference.    Labs: Basic Metabolic Panel:  Recent Labs Lab 09/25/13 1050 09/26/13 0505 09/26/13 0535 09/27/13 0556 09/28/13 0521  NA 131* 134*  --  136 137  K 2.7* 3.1*  --  3.6 3.4*  CL 91* 98  --  99 102  CO2 24 22  --  27 24  GLUCOSE 122* 104*  --  79 83  BUN 11 10  --  10 12  CREATININE 0.80 0.83  --  0.98 0.89  CALCIUM 9.2 8.8  --  9.2 9.4  MG  --   --  1.6  --   --    Liver Function Tests:  Recent Labs Lab 09/25/13 1050  AST 24  ALT 12  ALKPHOS 57  BILITOT 0.3  PROT 7.2  ALBUMIN 3.3*   CBC:  Recent Labs Lab 09/25/13 1050 09/26/13 0505 09/27/13 0556 09/28/13 0521  WBC 4.1 3.4* 3.5* 3.4*  NEUTROABS 3.0  --   --   --   HGB  12.3 12.1 12.3 12.1  HCT 36.1 35.9* 37.2 36.0  MCV 84.5 85.1 86.3 85.1  PLT 172 133* 144* 147*   BNP: BNP (last 3 results)  Recent Labs  10/05/12 1110  PROBNP 77.6   CBG:  Recent Labs Lab 09/27/13 0805 09/27/13 1147 09/27/13 1701 09/27/13 2148 09/28/13 0756  GLUCAP 70 184* 103* 92 83     IMAGING STUDIES Dg Chest 2 View  09/25/2013   CLINICAL DATA:  Fever with cough and congestion as well as headache and body aches for 2 weeks.  EXAM: CHEST  2 VIEW  COMPARISON:  03/07/2013  FINDINGS: The heart size and mediastinal contours are within normal limits. Both lungs are clear. The remainder of the exam is unchanged.  IMPRESSION: No active cardiopulmonary disease.   Electronically Signed   By: Elberta Fortis M.D.   On: 09/25/2013 10:53   Ct Abdomen Pelvis W Contrast  09/25/2013   CLINICAL DATA:  Fever.  Right lower quadrant pain.  EXAM: CT ABDOMEN AND PELVIS WITH CONTRAST  TECHNIQUE: Multidetector CT imaging of the abdomen and pelvis was performed using the standard protocol following bolus administration of intravenous contrast.  CONTRAST:  50mL OMNIPAQUE IOHEXOL 300 MG/ML SOLN, 80mL OMNIPAQUE IOHEXOL 300 MG/ML SOLN  COMPARISON:  CT 03/05/2012.  FINDINGS: Liver normal. Spleen normal. Pancreas normal. Gallbladder is nondistended. No biliary distention. Hepatic veins, portal vein, splenic vein patent.  Adrenals normal. Right nephrectomy. Right renal bed is unremarkable. Left kidney is unremarkable. No hydronephrosis. No evidence of obstructing ureteral stone. Bladder is nondistended. Hysterectomy. No adnexal mass. No free fluid.  No significant adenopathy. Shotty retroperitoneal lymph nodes are noted. These are nonspecific. Aortoiliac and visceral vascular calcification consistent with atherosclerotic vascular disease. Left renal origin atherosclerotic vascular disease is noted. Given that the left kidney is the patient's remaining kidney further evaluation of the left renal artery should be  considered. Left renal vein is patent.  Appendix is not identified. Several slightly distended loops of small and large bowel are noted. This is a nonspecific finding. Mild soft tissue prominence in the region of the ileocecal region. A cecal mass cannot be excluded. Although these changes may be related to stool, inflammatory, infectious, or malignant etiologies could present in this fashion and colonoscopy suggested for further evaluation. No free air is identified. Circumferential soft tissue thickening noted in the region the gastric antrum. Endoscopic evaluation is suggested as inflammatory and/or malignant changes could present in this fashion. Distal esophagus is normal.  Heart size is normal. Abdominal wall is intact. Lung bases are clear. Degenerative changes lumbar spine and both hips. Degenerative changes of the SI joints.  IMPRESSION: 1. Circumferential gastric antral wall thickening. Antritis or gastric malignancy could present in this fashion and endoscopic evaluation is suggested for further evaluation.  Soft tissue thickening in the ileocecal region. Colonoscopy suggested for further evaluation of this region. 2. Right nephrectomy.  No evidence of metastatic disease. 3. Aortoiliac and visceral atherosclerotic vascular disease. There appears to be a left renal artery origin stenosis. Given that this is the patient's only kidney further evaluation of the left renal artery is suggested.   Electronically Signed   By: Maisie Fus  Register   On: 09/25/2013 13:03    DISCHARGE EXAMINATION: Filed Vitals:   09/26/13 2128 09/27/13 0612 09/27/13 1356 09/27/13 2133  BP: 150/87 147/64 138/71 130/66  Pulse: 63 64 66 64  Temp: 98.6 F (37 C) 97.5 F (36.4 C) 99.1 F (37.3 C) 97.7 F (36.5 C)  TempSrc: Oral Oral Oral Oral  Resp:  18 14 14   Height:      Weight:      SpO2: 99% 97% 98% 99%   General appearance: alert, cooperative, appears stated age and no distress Resp: coarse breath sounds but no  crackles or wheezing. Cardio: regular rate and rhythm, S1, S2 normal, no murmur, click, rub or gallop GI: soft, non-tender; bowel sounds normal; no masses,  no organomegaly Neurologic: Alert and oriented X 3, normal strength and tone. Normal symmetric reflexes. Normal coordination and gait  DISPOSITION: Home   Future Appointments Provider Department Dept Phone   10/24/2013 9:00 AM [facility] Nurse [facility] [phone]   10/27/2013 10:00 AM Erick Colace, MD [phone]      ALLERGIES:  Allergies  Allergen Reactions  . Gabapentin Swelling  . Adhesive [Tape] Rash    Current Discharge Medication List    START taking these medications   Details  albuterol (PROVENTIL HFA;VENTOLIN HFA) 108 (90 BASE) MCG/ACT inhaler Inhale 2 puffs into the lungs every 6 (six) hours as needed for wheezing or shortness of breath. Qty: 1 Inhaler, Refills: 2    levofloxacin (LEVAQUIN) 500 MG tablet Take 1 tablet (500 mg total) by mouth daily. For 5 more days Qty: 5 tablet, Refills: 0    oseltamivir (TAMIFLU) 75 MG capsule Take 1 capsule (75 mg total) by mouth 2 (two) times daily. For 7 more doses. Qty: 7 capsule, Refills: 0      CONTINUE these medications which have NOT CHANGED   Details  alendronate (FOSAMAX) 70 MG tablet Take 70 mg by mouth every Sunday. Take with a full glass of water on an empty stomach.    amLODipine (NORVASC) 5 MG tablet Take 5 mg by mouth daily.    aspirin EC 81 MG tablet Take 81 mg by mouth daily.    atorvastatin (LIPITOR) 40 MG tablet Take 40 mg by mouth daily.     citalopram (CELEXA) 40 MG tablet Take 60 mg by mouth daily. **Needs office visit.**    clonazePAM (KLONOPIN) 0.5 MG tablet Take 0.5 mg by mouth at bedtime.    fish oil-omega-3 fatty acids 1000 MG capsule Take 1 g by mouth daily.      fluticasone (FLONASE) 50 MCG/ACT nasal spray Place 1 spray into both nostrils daily as needed for allergies or  rhinitis.    furosemide (LASIX) 20 MG tablet Take 10 mg by mouth daily.    guaiFENesin-dextromethorphan (ROBITUSSIN DM) 100-10 MG/5ML syrup Take 5 mLs by mouth every 4 (four) hours as needed for cough.    metFORMIN (GLUCOPHAGE) 1000 MG tablet Take 1,000 mg by mouth 2 (two) times daily with a meal.      metoprolol (TOPROL-XL) 100 MG 24 hr tablet  Take 100 mg by mouth daily.      nitroGLYCERIN (NITROLINGUAL) 0.4 MG/SPRAY spray Place 1 spray under the tongue every 5 (five) minutes x 3 doses as needed for chest pain.    omeprazole (PRILOSEC) 40 MG capsule Take 40 mg by mouth daily.      traMADol (ULTRAM) 50 MG tablet Take 50 mg by mouth every 8 (eight) hours as needed for moderate pain.    valsartan-hydrochlorothiazide (DIOVAN-HCT) 160-12.5 MG per tablet Take 1 tablet by mouth 2 (two) times daily.      vitamin E 400 UNIT capsule Take 400 Units by mouth daily.      STOP taking these medications     amoxicillin (AMOXIL) 500 MG capsule        Follow-up Information   Follow up with [facility], MD. Schedule an appointment as soon as possible for a visit in 1 week.   Specialty:  Internal Medicine   Contact information:   [address] [place] [address] [phone]       TOTAL DISCHARGE TIME: 35 mins  [facility]  [facility] Pager [phone]  09/28/2013, 8:43 AM

## 2013-09-29 NOTE — Progress Notes (Signed)
Discharge summary sent to payer through [employer]  

## 2013-10-24 ENCOUNTER — Ambulatory Visit (INDEPENDENT_AMBULATORY_CARE_PROVIDER_SITE_OTHER): Payer: Commercial Managed Care - HMO | Admitting: *Deleted

## 2013-10-24 DIAGNOSIS — M2040 Other hammer toe(s) (acquired), unspecified foot: Secondary | ICD-10-CM

## 2013-10-24 DIAGNOSIS — E1149 Type 2 diabetes mellitus with other diabetic neurological complication: Secondary | ICD-10-CM

## 2013-10-24 NOTE — Patient Instructions (Signed)
Our office will notify you once your diabetic shoes and insoles arrive. At that time an appointment will be needed to pick them up.  

## 2013-10-24 NOTE — Progress Notes (Signed)
Measured for diabetic shoes and insoles. 

## 2013-10-27 ENCOUNTER — Ambulatory Visit (HOSPITAL_BASED_OUTPATIENT_CLINIC_OR_DEPARTMENT_OTHER): Payer: Medicare PPO | Admitting: Physical Medicine & Rehabilitation

## 2013-10-27 ENCOUNTER — Encounter: Payer: Self-pay | Admitting: Physical Medicine & Rehabilitation

## 2013-10-27 ENCOUNTER — Encounter: Payer: Medicare PPO | Attending: Physical Medicine & Rehabilitation

## 2013-10-27 VITALS — BP 152/74 | HR 72 | Resp 14 | Ht 63.0 in | Wt 148.0 lb

## 2013-10-27 DIAGNOSIS — M7120 Synovial cyst of popliteal space [Baker], unspecified knee: Secondary | ICD-10-CM | POA: Insufficient documentation

## 2013-10-27 DIAGNOSIS — M1710 Unilateral primary osteoarthritis, unspecified knee: Secondary | ICD-10-CM | POA: Insufficient documentation

## 2013-10-27 DIAGNOSIS — M76899 Other specified enthesopathies of unspecified lower limb, excluding foot: Secondary | ICD-10-CM

## 2013-10-27 DIAGNOSIS — M7061 Trochanteric bursitis, right hip: Secondary | ICD-10-CM

## 2013-10-27 DIAGNOSIS — M48062 Spinal stenosis, lumbar region with neurogenic claudication: Secondary | ICD-10-CM | POA: Insufficient documentation

## 2013-10-27 DIAGNOSIS — G894 Chronic pain syndrome: Secondary | ICD-10-CM | POA: Insufficient documentation

## 2013-10-27 DIAGNOSIS — M7062 Trochanteric bursitis, left hip: Principal | ICD-10-CM

## 2013-10-27 NOTE — Progress Notes (Signed)
Subjective:    Patient ID: Theresa Cline, female    DOB: 04/01/1951, 63 y.o.   MRN: [id number]  HPI  Pain Inventory Average Pain 8 Pain Right Now 7 My pain is stabbing and aching  In the last 24 hours, has pain interfered with the following? General activity 10 Relation with others 10 Enjoyment of life 10 What TIME of day is your pain at its worst? all day Sleep (in general) Poor  Pain is worse with: walking, bending, sitting, inactivity, standing and some activites Pain improves with: injections Relief from Meds: 4  Mobility walk with assistance use a cane how many minutes can you walk? 20-30 ability to climb steps?  no do you drive?  no  Function Do you have any goals in this area?  no  Neuro/Psych tingling dizziness confusion depression anxiety loss of taste or smell  Prior Studies Any changes since last visit?  yes x-rays CT/MRI  Physicians involved in your care Any changes since last visit?  no   Family History  Problem Relation Age of Onset  . Leukemia Mother     CLL  . Diabetes type II Mother   . Other Mother     Hypercholesterolemia  . Osteoarthritis Mother   . COPD Mother   . Hypertension Mother   . Hodgkin's lymphoma Father   . Diabetes type II Sister   . Osteoporosis Sister   . Paranoid behavior Brother     Unsure if diagnosed with Schizophrenia  . Diabetes type II Sister   . Hypertension Sister   . Diabetes type II Sister   . Hypertension Sister   . Schizophrenia Sister   . Heart disease Sister     Enlarged heart  . Glaucoma Sister   . Alcohol abuse Sister   . Glaucoma Sister   . Alcohol abuse Sister   . Bipolar disorder Daughter   . Lupus Daughter     SLE   History   Social History  . Marital Status: Divorced    Spouse Name: N/A    Number of Children: N/A  . Years of Education: N/A   Occupational History  . 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 Smoking  status: Current Every Day Smoker -- 1.00 packs/day for 40 years    Types: Cigarettes  . Smokeless tobacco: Never Used  . Alcohol Use: No  . Drug Use: No  . Sexual Activity: None   Other Topics Concern  . None   Social History Narrative   Divorced   Disability mainly for back and chronic intermittent vertigo - since tumor removed - no surgical correction for back   Lives alone, keeps her grandson, 53 yr old daughter is a mother   Past Surgical History  Procedure Laterality Date  . Resection of left acoustic neuroma  1999  . Coronary angioplasty with stent placement  2004    Normal LV function  . Total abdominal hysterectomy w/ bilateral salpingoophorectomy  04/12/1988    For fibroid tumors; [place], [place]  . Carpal tunnel release  08/09/1998    Right  . Carpal tunnel release  09/08/1998    Left  . Removal lipoma      Right foot  . Cesarean section      x3   Past Medical History  Diagnosis Date  . S/P carpal tunnel release   . Vertigo   . Tobacco  abuse   . Encounter for long-term (current) use of other medications   . Osteoporosis   . Left acoustic neuroma     Hx of  . Diabetic peripheral neuropathy   . Mixed hyperlipidemia   . Primary osteoarthritis of both knees   . Chronic pain syndrome   . Obesity   . Hypertension   . CAD (coronary artery disease)   . Diabetes mellitus type II, controlled   . Bronchitis, acute   . Diabetes type 2, controlled   . Cancer     Renal s/p nephrectomy   BP 152/74  Pulse 72  Resp 14  Ht 5\' 3"  (1.6 m)  Wt 148 lb (67.132 kg)  BMI 26.22 kg/m2  SpO2 96%     Review of Systems  Constitutional: Positive for fever, chills and appetite change.  HENT:       Loss of taste or smell  Respiratory: Positive for shortness of breath.   Cardiovascular: Positive for leg swelling.  Gastrointestinal: Positive for nausea and vomiting.  Genitourinary:       Bladder control problems  Neurological: Positive for dizziness.       Tingling    Psychiatric/Behavioral: Positive for confusion and dysphoric mood. The patient is nervous/anxious.   All other systems reviewed and are negative.       Objective:   Physical Exam Tenderness over the right trochanteric bursa, severe         Assessment & Plan:  #1. Right hip trochanteric bursitis  Trochanteric bursa injection  without ultrasound guidance  Indication Trochanteric bursitis. Exam has tenderness over the greater trochanter of the hip. Pain has not responded to conservative care such as exercise therapy and oral medications. Pain interferes with sleep or with mobility Informed consent was obtained after describing risks and benefits of the procedure with the patient these include bleeding bruising and infection. Patient has signed written consent form. Patient placed in a lateral decubitus position with the affected hip superior. Point of maximal pain was palpated marked and prepped with Betadine and entered with a needle to bone contact. Needle slightly withdrawn then 6 mg celestone with 4 cc 1% lidocaine were injected. Patient tolerated procedure well. Post procedure instructions given.

## 2013-10-27 NOTE — Patient Instructions (Signed)
Right hip trochanteric bursa injection today Celestone 6 mg Lidocaine 1% 4 cc

## 2013-11-07 ENCOUNTER — Encounter (HOSPITAL_COMMUNITY): Payer: Self-pay | Admitting: Emergency Medicine

## 2013-11-07 ENCOUNTER — Emergency Department (HOSPITAL_COMMUNITY): Payer: Medicare PPO

## 2013-11-07 ENCOUNTER — Emergency Department (HOSPITAL_COMMUNITY)
Admission: EM | Admit: 2013-11-07 | Discharge: 2013-11-07 | Disposition: A | Discharge status: Home / Self Care | Payer: Medicare PPO | Attending: Emergency Medicine | Admitting: Emergency Medicine

## 2013-11-07 DIAGNOSIS — G894 Chronic pain syndrome: Secondary | ICD-10-CM | POA: Insufficient documentation

## 2013-11-07 DIAGNOSIS — Y92009 Unspecified place in unspecified non-institutional (private) residence as the place of occurrence of the external cause: Secondary | ICD-10-CM | POA: Insufficient documentation

## 2013-11-07 DIAGNOSIS — I2510 Atherosclerotic heart disease of native coronary artery without angina pectoris: Secondary | ICD-10-CM | POA: Insufficient documentation

## 2013-11-07 DIAGNOSIS — W010XXA Fall on same level from slipping, tripping and stumbling without subsequent striking against object, initial encounter: Secondary | ICD-10-CM | POA: Insufficient documentation

## 2013-11-07 DIAGNOSIS — Z85528 Personal history of other malignant neoplasm of kidney: Secondary | ICD-10-CM | POA: Insufficient documentation

## 2013-11-07 DIAGNOSIS — M810 Age-related osteoporosis without current pathological fracture: Secondary | ICD-10-CM | POA: Insufficient documentation

## 2013-11-07 DIAGNOSIS — M1710 Unilateral primary osteoarthritis, unspecified knee: Secondary | ICD-10-CM | POA: Insufficient documentation

## 2013-11-07 DIAGNOSIS — E1142 Type 2 diabetes mellitus with diabetic polyneuropathy: Secondary | ICD-10-CM | POA: Insufficient documentation

## 2013-11-07 DIAGNOSIS — Y9383 Activity, rough housing and horseplay: Secondary | ICD-10-CM | POA: Insufficient documentation

## 2013-11-07 DIAGNOSIS — Z7982 Long term (current) use of aspirin: Secondary | ICD-10-CM | POA: Insufficient documentation

## 2013-11-07 DIAGNOSIS — S139XXA Sprain of joints and ligaments of unspecified parts of neck, initial encounter: Secondary | ICD-10-CM | POA: Insufficient documentation

## 2013-11-07 DIAGNOSIS — E1149 Type 2 diabetes mellitus with other diabetic neurological complication: Secondary | ICD-10-CM | POA: Insufficient documentation

## 2013-11-07 DIAGNOSIS — S0990XA Unspecified injury of head, initial encounter: Secondary | ICD-10-CM

## 2013-11-07 DIAGNOSIS — Z9861 Coronary angioplasty status: Secondary | ICD-10-CM | POA: Insufficient documentation

## 2013-11-07 DIAGNOSIS — E782 Mixed hyperlipidemia: Secondary | ICD-10-CM | POA: Insufficient documentation

## 2013-11-07 DIAGNOSIS — F17200 Nicotine dependence, unspecified, uncomplicated: Secondary | ICD-10-CM | POA: Insufficient documentation

## 2013-11-07 DIAGNOSIS — Z79899 Other long term (current) drug therapy: Secondary | ICD-10-CM | POA: Insufficient documentation

## 2013-11-07 DIAGNOSIS — S161XXA Strain of muscle, fascia and tendon at neck level, initial encounter: Secondary | ICD-10-CM

## 2013-11-07 DIAGNOSIS — I10 Essential (primary) hypertension: Secondary | ICD-10-CM | POA: Insufficient documentation

## 2013-11-07 DIAGNOSIS — R42 Dizziness and giddiness: Secondary | ICD-10-CM | POA: Insufficient documentation

## 2013-11-07 MED ORDER — OXYCODONE-ACETAMINOPHEN 5-325 MG PO TABS
1.0000 | ORAL_TABLET | Freq: Once | ORAL | Status: AC
  Administered 2013-11-07: 1 via ORAL
  Filled 2013-11-07: qty 1

## 2013-11-07 NOTE — Discharge Instructions (Signed)
Cervical Sprain A cervical sprain is an injury in the neck in which the strong, fibrous tissues (ligaments) that connect your neck bones stretch or tear. Cervical sprains can range from mild to severe. Severe cervical sprains can cause the neck vertebrae to be unstable. This can lead to damage of the spinal cord and can result in serious nervous system problems. The amount of time it takes for a cervical sprain to get better depends on the cause and extent of the injury. Most cervical sprains heal in 1 to 3 weeks. CAUSES  Severe cervical sprains may be caused by:   Contact sport injuries (such as from football, rugby, wrestling, hockey, auto racing, gymnastics, diving, martial arts, or boxing).   Motor vehicle collisions.   Whiplash injuries. This is an injury from a sudden forward-and backward whipping movement of the head and neck.  Falls.  Mild cervical sprains may be caused by:   Being in an awkward position, such as while cradling a telephone between your ear and shoulder.   Sitting in a chair that does not offer proper support.   Working at a poorly Landscape architect station.   Looking up or down for long periods of time.  SYMPTOMS   Pain, soreness, stiffness, or a burning sensation in the front, back, or sides of the neck. This discomfort may develop immediately after the injury or slowly, 24 hours or more after the injury.   Pain or tenderness directly in the middle of the back of the neck.   Shoulder or upper back pain.   Limited ability to move the neck.   Headache.   Dizziness.   Weakness, numbness, or tingling in the hands or arms.   Muscle spasms.   Difficulty swallowing or chewing.   Tenderness and swelling of the neck.  DIAGNOSIS  Most of the time your health care provider can diagnose a cervical sprain by taking your history and doing a physical exam. Your health care provider will ask about previous neck injuries and any known neck  problems, such as arthritis in the neck. X-rays may be taken to find out if there are any other problems, such as with the bones of the neck. Other tests, such as a CT scan or MRI, may also be needed.  TREATMENT  Treatment depends on the severity of the cervical sprain. Mild sprains can be treated with rest, keeping the neck in place (immobilization), and pain medicines. Severe cervical sprains are immediately immobilized. Further treatment is done to help with pain, muscle spasms, and other symptoms and may include:  Medicines, such as pain relievers, numbing medicines, or muscle relaxants.   Physical therapy. This may involve stretching exercises, strengthening exercises, and posture training. Exercises and improved posture can help stabilize the neck, strengthen muscles, and help stop symptoms from returning.  HOME CARE INSTRUCTIONS   Put ice on the injured area.   Put ice in a plastic bag.   Place a towel between your skin and the bag.   Leave the ice on for 15 20 minutes, 3 4 times a day.   If your injury was severe, you may have been given a cervical collar to wear. A cervical collar is a two-piece collar designed to keep your neck from moving while it heals.  Do not remove the collar unless instructed by your health care provider.  If you have long hair, keep it outside of the collar.  Ask your health care provider before making any adjustments to your collar.  Minor adjustments may be required over time to improve comfort and reduce pressure on your chin or on the back of your head.  Ifyou are allowed to remove the collar for cleaning or bathing, follow your health care provider's instructions on how to do so safely.  Keep your collar clean by wiping it with mild soap and water and drying it completely. If the collar you have been given includes removable pads, remove them every 1 2 days and hand wash them with soap and water. Allow them to air dry. They should be completely  dry before you wear them in the collar.  If you are allowed to remove the collar for cleaning and bathing, wash and dry the skin of your neck. Check your skin for irritation or sores. If you see any, tell your health care provider.  Do not drive while wearing the collar.   Only take over-the-counter or prescription medicines for pain, discomfort, or fever as directed by your health care provider.   Keep all follow-up appointments as directed by your health care provider.   Keep all physical therapy appointments as directed by your health care provider.   Make any needed adjustments to your workstation to promote good posture.   Avoid positions and activities that make your symptoms worse.   Warm up and stretch before being active to help prevent problems.  SEEK MEDICAL CARE IF:   Your pain is not controlled with medicine.   You are unable to decrease your pain medicine over time as planned.   Your activity level is not improving as expected.  SEEK IMMEDIATE MEDICAL CARE IF:   You develop any bleeding.  You develop stomach upset.  You have signs of an allergic reaction to your medicine.   Your symptoms get worse.   You develop new, unexplained symptoms.   You have numbness, tingling, weakness, or paralysis in any part of your body.  MAKE SURE YOU:   Understand these instructions.  Will watch your condition.  Will get help right away if you are not doing well or get worse. Document Released: 07/23/2007 Document Revised: 07/16/2013 Document Reviewed: 04/02/2013 [facility] Patient Information 2014 [place].  Head Injury, Adult You have received a head injury. It does not appear serious at this time. Headaches and vomiting are common following head injury. It should be easy to awaken from sleeping. Sometimes it is necessary for you to stay in the emergency department for a while for observation. Sometimes admission to the hospital may be needed. After  injuries such as yours, most problems occur within the first 24 hours, but side effects may occur up to 7 10 days after the injury. It is important for you to carefully monitor your condition and contact your health care provider or seek immediate medical care if there is a change in your condition. WHAT ARE THE TYPES OF HEAD INJURIES? Head injuries can be as minor as a bump. Some head injuries can be more severe. More severe head injuries include:  A jarring injury to the brain (concussion).  A bruise of the brain (contusion). This mean there is bleeding in the brain that can cause swelling.  A cracked skull (skull fracture).  Bleeding in the brain that collects, clots, and forms a bump (hematoma). WHAT CAUSES A HEAD INJURY? A serious head injury is most likely to happen to someone who is in a car wreck and is not wearing a seat belt. Other causes of major head injuries include bicycle or  motorcycle accidents, sports injuries, and falls. HOW ARE HEAD INJURIES DIAGNOSED? A complete history of the event leading to the injury and your current symptoms will be helpful in diagnosing head injuries. Many times, pictures of the brain, such as CT or MRI are needed to see the extent of the injury. Often, an overnight hospital stay is necessary for observation.  WHEN SHOULD I SEEK IMMEDIATE MEDICAL CARE?  You should get help right away if:  You have confusion or drowsiness.  You feel sick to your stomach (nauseous) or have continued, forceful vomiting.  You have dizziness or unsteadiness that is getting worse.  You have severe, continued headaches not relieved by medicine. Only take over-the-counter or prescription medicines for pain, fever, or discomfort as directed by your health care provider.  You do not have normal function of the arms or legs or are unable to walk.  You notice changes in the black spots in the center of the colored part of your eye (pupil).  You have a clear or bloody fluid  coming from your nose or ears.  You have a loss of vision. During the next 24 hours after the injury, you must stay with someone who can watch you for the warning signs. This person should contact local emergency services (911 in the [place] if you have seizures, you become unconscious, or you are unable to wake up. HOW CAN I PREVENT A HEAD INJURY IN THE FUTURE? The most important factor for preventing major head injuries is avoiding motor vehicle accidents. To minimize the potential for damage to your head, it is crucial to wear seat belts while riding in motor vehicles. Wearing helmets while bike riding and playing collision sports (like football) is also helpful. Also, avoiding dangerous activities around the house will further help reduce your risk of head injury.  WHEN CAN I RETURN TO NORMAL ACTIVITIES AND ATHLETICS? You should be reevaluated by your health care provider before returning to these activities. If you have any of the following symptoms, you should not return to activities or contact sports until 1 week after the symptoms have stopped:  Persistent headache.  Dizziness or vertigo.  Poor attention and concentration.  Confusion.  Memory problems.  Nausea or vomiting.  Fatigue or tire easily.  Irritability.  Intolerant of bright lights or loud noises.  Anxiety or depression.  Disturbed sleep. MAKE SURE YOU:   Understand these instructions.  Will watch your condition.  Will get help right away if you are not doing well or get worse. Document Released: 09/25/2005 Document Revised: 07/16/2013 Document Reviewed: 06/02/2013 [facility] Patient Information 2014 [place].

## 2013-11-07 NOTE — ED Notes (Signed)
Patient states that while playing with her grandson she tripped over his leg, fell and hit her forehead on the base of her couch which is wood. She denies loss of consciousness. Yolanda Bonine was afraid and called 911.

## 2013-11-07 NOTE — ED Notes (Signed)
Bed: WA14 Expected date:  Expected time:  Means of arrival:  Comments: EMS-fall 

## 2013-11-07 NOTE — ED Notes (Addendum)
Charted in error.

## 2013-11-07 NOTE — ED Provider Notes (Signed)
TIME SEEN: 7:30 PM  CHIEF COMPLAINT: Head injury  HPI: Patient is a 63 year old female with a history of chronic vertigo, diabetes, hyperlipidemia, hypertension, CAD, renal carcinoma status post nephrectomy who presents to the emergency department after she had a mechanical fall today and hit her head. She reports she was playing with her grandson and she actually tripped over his leg and fell forward striking her head at the base of her couch which is wooden. She denies any loss of consciousness. She's complaining of some mild neck tightness. No numbness, weakness, incontinence. She denies any chest pain, shortness breath, palpitations or dizziness that led her fall. No recent infectious symptoms. She is not on anticoagulation.  ROS: See HPI Constitutional: no fever  Eyes: no drainage  ENT: no runny nose   Cardiovascular:  no chest pain  Resp: no SOB  GI: no vomiting GU: no dysuria Integumentary: no rash  Allergy: no hives  Musculoskeletal: no leg swelling  Neurological: no slurred speech ROS otherwise negative  PAST MEDICAL HISTORY/PAST SURGICAL HISTORY:  Past Medical History  Diagnosis Date  . S/P carpal tunnel release   . Vertigo   . Tobacco abuse   . Encounter for long-term (current) use of other medications   . Osteoporosis   . Left acoustic neuroma     Hx of  . Diabetic peripheral neuropathy   . Mixed hyperlipidemia   . Primary osteoarthritis of both knees   . Chronic pain syndrome   . Obesity   . Hypertension   . CAD (coronary artery disease)   . Diabetes mellitus type II, controlled   . Bronchitis, acute   . Diabetes type 2, controlled   . Cancer     Renal s/p nephrectomy    MEDICATIONS:  Prior to Admission medications   Medication Sig Start Date End Date Taking? Authorizing Provider  albuterol (PROVENTIL HFA;VENTOLIN HFA) 108 (90 BASE) MCG/ACT inhaler Inhale 2 puffs into the lungs every 6 (six) hours as needed for wheezing or shortness of breath. 09/28/13    Bonnielee Haff, MD  alendronate (FOSAMAX) 70 MG tablet Take 70 mg by mouth every Sunday. Take with a full glass of water on an empty stomach.    Historical Provider, MD  amLODipine (NORVASC) 5 MG tablet Take 5 mg by mouth daily.    Historical Provider, MD  aspirin EC 81 MG tablet Take 81 mg by mouth daily.    Historical Provider, MD  atorvastatin (LIPITOR) 40 MG tablet Take 40 mg by mouth daily.  08/19/12   Historical Provider, MD  citalopram (CELEXA) 40 MG tablet Take 60 mg by mouth daily. **Needs office visit.**    Historical Provider, MD  clonazePAM (KLONOPIN) 0.5 MG tablet Take 0.5 mg by mouth at bedtime.    Historical Provider, MD  fish oil-omega-3 fatty acids 1000 MG capsule Take 1 g by mouth daily.      Historical Provider, MD  fluticasone (FLONASE) 50 MCG/ACT nasal spray Place 1 spray into both nostrils daily as needed for allergies or rhinitis.    Historical Provider, MD  furosemide (LASIX) 20 MG tablet Take 10 mg by mouth daily.    Historical Provider, MD  guaiFENesin-dextromethorphan (ROBITUSSIN DM) 100-10 MG/5ML syrup Take 5 mLs by mouth every 4 (four) hours as needed for cough.    Historical Provider, MD  levofloxacin (LEVAQUIN) 500 MG tablet Take 1 tablet (500 mg total) by mouth daily. For 5 more days 09/28/13   Bonnielee Haff, MD  metFORMIN (GLUCOPHAGE) 1000 MG tablet  Take 1,000 mg by mouth 2 (two) times daily with a meal.      Historical Provider, MD  metoprolol (TOPROL-XL) 100 MG 24 hr tablet Take 100 mg by mouth daily.      Historical Provider, MD  nitroGLYCERIN (NITROLINGUAL) 0.4 MG/SPRAY spray Place 1 spray under the tongue every 5 (five) minutes x 3 doses as needed for chest pain.    Historical Provider, MD  omeprazole (PRILOSEC) 40 MG capsule Take 40 mg by mouth daily.      Historical Provider, MD  oseltamivir (TAMIFLU) 75 MG capsule Take 1 capsule (75 mg total) by mouth 2 (two) times daily. For 7 more doses. 09/28/13   Bonnielee Haff, MD  traMADol (ULTRAM) 50 MG tablet Take 50  mg by mouth every 8 (eight) hours as needed for moderate pain.    Historical Provider, MD  valsartan-hydrochlorothiazide (DIOVAN-HCT) 160-12.5 MG per tablet Take 1 tablet by mouth 2 (two) times daily.      Historical Provider, MD  vitamin E 400 UNIT capsule Take 400 Units by mouth daily.    Historical Provider, MD    ALLERGIES:  Allergies  Allergen Reactions  . Gabapentin Swelling  . Adhesive [Tape] Rash    SOCIAL HISTORY:  History  Substance Use Topics  . Smoking status: Current Every Day Smoker -- 1.00 packs/day for 40 years    Types: Cigarettes  . Smokeless tobacco: Never Used  . Alcohol Use: No    FAMILY HISTORY: Family History  Problem Relation Age of Onset  . Leukemia Mother     CLL  . Diabetes type II Mother   . Other Mother     Hypercholesterolemia  . Osteoarthritis Mother   . COPD Mother   . Hypertension Mother   . Hodgkin's lymphoma Father   . Diabetes type II Sister   . Osteoporosis Sister   . Paranoid behavior Brother     Unsure if diagnosed with Schizophrenia  . Diabetes type II Sister   . Hypertension Sister   . Diabetes type II Sister   . Hypertension Sister   . Schizophrenia Sister   . Heart disease Sister     Enlarged heart  . Glaucoma Sister   . Alcohol abuse Sister   . Glaucoma Sister   . Alcohol abuse Sister   . Bipolar disorder Daughter   . Lupus Daughter     SLE    EXAM: BP 141/78  Pulse 73  SpO2 100% CONSTITUTIONAL: Alert and oriented and responds appropriately to questions. Well-appearing; well-nourished; GCS 15 HEAD: Normocephalic; atraumatic EYES: Conjunctivae clear, PERRL, EOMI ENT: normal nose; no rhinorrhea; moist mucous membranes; pharynx without lesions noted; no dental injury; no septal hematoma NECK: Supple, no meningismus, no LAD; mild tenderness to palpation diffusely across patient's neck with no midline step-off or deformity, cervical collar in place CARD: RRR; S1 and S2 appreciated; no murmurs, no clicks, no rubs, no  gallops RESP: Normal chest excursion without splinting or tachypnea; breath sounds clear and equal bilaterally; no wheezes, no rhonchi, no rales; chest wall stable, nontender to palpation ABD/GI: Normal bowel sounds; non-distended; soft, non-tender, no rebound, no guarding PELVIS:  stable, nontender to palpation BACK:  The back appears normal and is non-tender to palpation, there is no CVA tenderness; no midline spinal tenderness, step-off or deformity EXT: Normal ROM in all joints; non-tender to palpation; no edema; normal capillary refill; no cyanosis    SKIN: Normal color for age and race; warm NEURO: Moves all extremities equally, strength 5/5  in all 4 extremities, sensation to light touch intact diffusely, cranial nerves II through XII intact PSYCH: The patient's mood and manner are appropriate. Grooming and personal hygiene are appropriate.  MEDICAL DECISION MAKING: Patient with mechanical fall with head injury. She is complaining of some neck pain but denies pain medication. She is neurologically intact. She describes vertiginous symptoms as this is chronic. No new neurologic deficit. We'll obtain CT head and cervical spine. No other sign of injury on exam.  ED PROGRESS: Patient's CT head and cervical spine show no acute abnormality. Have cleared her C-spine clinically and removed her c-collar. Given head injury return precautions. Patient verbalizes understanding and is comfortable with plan.     Lyman, DO 11/07/13 2106

## 2013-11-27 ENCOUNTER — Ambulatory Visit: Payer: Medicare PPO | Admitting: Physical Medicine & Rehabilitation

## 2013-12-26 ENCOUNTER — Ambulatory Visit: Payer: Medicare PPO | Admitting: Physical Medicine & Rehabilitation

## 2013-12-30 ENCOUNTER — Encounter: Payer: Self-pay | Admitting: Physical Medicine & Rehabilitation

## 2013-12-30 ENCOUNTER — Encounter: Payer: Medicare PPO | Attending: Physical Medicine & Rehabilitation

## 2013-12-30 ENCOUNTER — Ambulatory Visit (HOSPITAL_BASED_OUTPATIENT_CLINIC_OR_DEPARTMENT_OTHER): Payer: Commercial Managed Care - HMO | Admitting: Physical Medicine & Rehabilitation

## 2013-12-30 VITALS — BP 128/77 | HR 66 | Resp 16 | Ht 63.0 in | Wt 148.0 lb

## 2013-12-30 DIAGNOSIS — M549 Dorsalgia, unspecified: Secondary | ICD-10-CM | POA: Diagnosis present

## 2013-12-30 DIAGNOSIS — M7120 Synovial cyst of popliteal space [Baker], unspecified knee: Secondary | ICD-10-CM | POA: Insufficient documentation

## 2013-12-30 DIAGNOSIS — G894 Chronic pain syndrome: Secondary | ICD-10-CM | POA: Insufficient documentation

## 2013-12-30 DIAGNOSIS — M76899 Other specified enthesopathies of unspecified lower limb, excluding foot: Secondary | ICD-10-CM

## 2013-12-30 DIAGNOSIS — M1710 Unilateral primary osteoarthritis, unspecified knee: Secondary | ICD-10-CM | POA: Insufficient documentation

## 2013-12-30 DIAGNOSIS — M48062 Spinal stenosis, lumbar region with neurogenic claudication: Secondary | ICD-10-CM | POA: Diagnosis not present

## 2013-12-30 DIAGNOSIS — M7062 Trochanteric bursitis, left hip: Secondary | ICD-10-CM

## 2013-12-30 MED ORDER — DULOXETINE HCL 30 MG PO CPEP: 30.0000 mg | ORAL_CAPSULE | Freq: Every day | ORAL | Status: DC

## 2013-12-30 MED ORDER — TRAMADOL HCL 50 MG PO TABS: 100.0000 mg | ORAL_TABLET | Freq: Three times a day (TID) | ORAL | Status: DC | PRN

## 2013-12-30 NOTE — Progress Notes (Signed)
Subjective:    Patient ID: Theresa Cline, female    DOB: 11-05-1950, 63 y.o.   MRN: [id number]  HPI  Pain Inventory Average Pain 8 Pain Right Now 10 My pain is sharp, stabbing and aching  In the last 24 hours, has pain interfered with the following? General activity 8 Relation with others 9 Enjoyment of life 10 What TIME of day is your pain at its worst? constant Sleep (in general) Poor  Pain is worse with: bending, inactivity and standing Pain improves with: pacing activities Relief from Meds: 5  Mobility use a cane how many minutes can you walk? 30-40 ability to climb steps?  no do you drive?  no  Function disabled: date disabled 03/1998 retired I need assistance with the following:  shopping  Neuro/Psych bladder control problems numbness tremor spasms anxiety loss of taste or smell  Prior Studies Any changes since last visit?  no  Physicians involved in your care Any changes since last visit?  no   Family History  Problem Relation Age of Onset  . Leukemia Mother     CLL  . Diabetes type II Mother   . Other Mother     Hypercholesterolemia  . Osteoarthritis Mother   . COPD Mother   . Hypertension Mother   . Hodgkin's lymphoma Father   . Diabetes type II Sister   . Osteoporosis Sister   . Paranoid behavior Brother     Unsure if diagnosed with Schizophrenia  . Diabetes type II Sister   . Hypertension Sister   . Diabetes type II Sister   . Hypertension Sister   . Schizophrenia Sister   . Heart disease Sister     Enlarged heart  . Glaucoma Sister   . Alcohol abuse Sister   . Glaucoma Sister   . Alcohol abuse Sister   . Bipolar disorder Daughter   . Lupus Daughter     SLE   History   Social History  . Marital Status: Divorced    Spouse Name: N/A    Number of Children: N/A  . Years of Education: N/A   Occupational History  . 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 Smoking  status: Current Every Day Smoker -- 1.00 packs/day for 40 years    Types: Cigarettes  . Smokeless tobacco: Never Used  . Alcohol Use: No  . Drug Use: No  . Sexual Activity: None   Other Topics Concern  . None   Social History Narrative   Divorced   Disability mainly for back and chronic intermittent vertigo - since tumor removed - no surgical correction for back   Lives alone, keeps her grandson, 58 yr old daughter is a mother   Past Surgical History  Procedure Laterality Date  . Resection of left acoustic neuroma  1999  . Coronary angioplasty with stent placement  2004    Normal LV function  . Total abdominal hysterectomy w/ bilateral salpingoophorectomy  04/12/1988    For fibroid tumors; [place], [place]  . Carpal tunnel release  08/09/1998    Right  . Carpal tunnel release  09/08/1998    Left  . Removal lipoma      Right foot  . Cesarean section      x3   Past Medical History  Diagnosis Date  . S/P carpal tunnel release   . Vertigo   . Tobacco abuse   .  Encounter for long-term (current) use of other medications   . Osteoporosis   . Left acoustic neuroma     Hx of  . Diabetic peripheral neuropathy   . Mixed hyperlipidemia   . Primary osteoarthritis of both knees   . Chronic pain syndrome   . Obesity   . Hypertension   . CAD (coronary artery disease)   . Diabetes mellitus type II, controlled   . Bronchitis, acute   . Diabetes type 2, controlled   . Cancer     Renal s/p nephrectomy   BP 128/77  Pulse 66  Resp 16  Ht 5\' 3"  (1.6 m)  Wt 148 lb (67.132 kg)  BMI 26.22 kg/m2  SpO2 97%  Opioid Risk Score:   Fall Risk Score: High Fall Risk (>13 points) (patient educated handout given)   Review of Systems  Constitutional: Positive for diaphoresis and appetite change.  Genitourinary: Positive for difficulty urinating.  Musculoskeletal: Positive for arthralgias, gait problem and myalgias.  Neurological: Positive for tremors and numbness.  Hematological:  Bruises/bleeds easily.  Psychiatric/Behavioral: The patient is nervous/anxious.   All other systems reviewed and are negative.       Objective:   Physical Exam        Assessment & Plan:  Left Trochanteric bursitis. Exam has tenderness over the greater trochanter of the hip. Pain has not responded to conservative care such as exercise therapy and oral medications. Pain interferes with sleep or with mobility Informed consent was obtained after describing risks and benefits of the procedure with the patient these include bleeding bruising and infection. Patient has signed written consent form. Patient placed in a lateral decubitus position with the affected hip superior. Point of maximal pain was palpated marked and prepped with Betadine and entered with a needle to bone contact. Needle slightly withdrawn then 6mg  betamethasone with 4 cc 1% lidocaine were injected. Patient tolerated procedure well. Post procedure instructions given.

## 2013-12-30 NOTE — Patient Instructions (Signed)
If you feel funny switching from Celexa to Cymbalta may need to go up to twice a day Cymbalta

## 2014-01-10 ENCOUNTER — Emergency Department (HOSPITAL_COMMUNITY)
Admission: EM | Admit: 2014-01-10 | Discharge: 2014-01-10 | Disposition: A | Discharge status: Home / Self Care | Payer: Medicare PPO | Attending: Emergency Medicine | Admitting: Emergency Medicine

## 2014-01-10 ENCOUNTER — Encounter (HOSPITAL_COMMUNITY): Payer: Self-pay | Admitting: Emergency Medicine

## 2014-01-10 DIAGNOSIS — M1710 Unilateral primary osteoarthritis, unspecified knee: Secondary | ICD-10-CM | POA: Insufficient documentation

## 2014-01-10 DIAGNOSIS — L03116 Cellulitis of left lower limb: Secondary | ICD-10-CM

## 2014-01-10 DIAGNOSIS — F17200 Nicotine dependence, unspecified, uncomplicated: Secondary | ICD-10-CM | POA: Insufficient documentation

## 2014-01-10 DIAGNOSIS — G8929 Other chronic pain: Secondary | ICD-10-CM | POA: Insufficient documentation

## 2014-01-10 DIAGNOSIS — Z86011 Personal history of benign neoplasm of the brain: Secondary | ICD-10-CM | POA: Diagnosis not present

## 2014-01-10 DIAGNOSIS — M79609 Pain in unspecified limb: Secondary | ICD-10-CM | POA: Diagnosis present

## 2014-01-10 DIAGNOSIS — I10 Essential (primary) hypertension: Secondary | ICD-10-CM | POA: Insufficient documentation

## 2014-01-10 DIAGNOSIS — Z9861 Coronary angioplasty status: Secondary | ICD-10-CM | POA: Insufficient documentation

## 2014-01-10 DIAGNOSIS — E782 Mixed hyperlipidemia: Secondary | ICD-10-CM | POA: Insufficient documentation

## 2014-01-10 DIAGNOSIS — L02419 Cutaneous abscess of limb, unspecified: Secondary | ICD-10-CM | POA: Diagnosis not present

## 2014-01-10 DIAGNOSIS — E1142 Type 2 diabetes mellitus with diabetic polyneuropathy: Secondary | ICD-10-CM | POA: Diagnosis not present

## 2014-01-10 DIAGNOSIS — E1149 Type 2 diabetes mellitus with other diabetic neurological complication: Secondary | ICD-10-CM | POA: Insufficient documentation

## 2014-01-10 DIAGNOSIS — Z79899 Other long term (current) drug therapy: Secondary | ICD-10-CM | POA: Diagnosis not present

## 2014-01-10 DIAGNOSIS — Z791 Long term (current) use of non-steroidal anti-inflammatories (NSAID): Secondary | ICD-10-CM | POA: Diagnosis not present

## 2014-01-10 DIAGNOSIS — M810 Age-related osteoporosis without current pathological fracture: Secondary | ICD-10-CM | POA: Diagnosis not present

## 2014-01-10 DIAGNOSIS — I2510 Atherosclerotic heart disease of native coronary artery without angina pectoris: Secondary | ICD-10-CM | POA: Insufficient documentation

## 2014-01-10 DIAGNOSIS — L03119 Cellulitis of unspecified part of limb: Principal | ICD-10-CM

## 2014-01-10 DIAGNOSIS — Z85528 Personal history of other malignant neoplasm of kidney: Secondary | ICD-10-CM | POA: Diagnosis not present

## 2014-01-10 DIAGNOSIS — Z905 Acquired absence of kidney: Secondary | ICD-10-CM | POA: Diagnosis not present

## 2014-01-10 DIAGNOSIS — Z7982 Long term (current) use of aspirin: Secondary | ICD-10-CM | POA: Insufficient documentation

## 2014-01-10 MED ORDER — CLINDAMYCIN HCL 150 MG PO CAPS: 300.0000 mg | ORAL_CAPSULE | Freq: Three times a day (TID) | ORAL | Status: DC

## 2014-01-10 MED ORDER — HYDROXYZINE HCL 25 MG PO TABS
25.0000 mg | ORAL_TABLET | Freq: Once | ORAL | Status: AC
  Administered 2014-01-10: 25 mg via ORAL
  Filled 2014-01-10: qty 1

## 2014-01-10 MED ORDER — HYDROXYZINE HCL 25 MG PO TABS: 25.0000 mg | ORAL_TABLET | Freq: Three times a day (TID) | ORAL | Status: DC

## 2014-01-10 MED ORDER — CLINDAMYCIN HCL 300 MG PO CAPS
300.0000 mg | ORAL_CAPSULE | Freq: Once | ORAL | Status: AC
Start: 2014-01-10 — End: 2014-01-10
  Administered 2014-01-10: 300 mg via ORAL
  Filled 2014-01-10: qty 1

## 2014-01-10 NOTE — Discharge Instructions (Signed)
Take medications as directed. Caution Hydroxyzine can cause sedation, do not drive with medication. Return to Emergency department tomorrow for reevaluation of Left Ankle to ensure cellulitis is not spreading. Return to Emergency department sooner if you develop drastic worsening pain, fever, feeling that your heart is racing, or feeling like you are going to pass out.    Cellulitis Cellulitis is an infection of the skin and the tissue under the skin. The infected area is usually red and tender. This happens most often in the arms and lower legs. HOME CARE   Take your antibiotic medicine as told. Finish the medicine even if you start to feel better.  Keep the infected arm or leg raised (elevated).  Put a warm cloth on the area up to 4 times per day.  Only take medicines as told by your doctor.  Keep all doctor visits as told. GET HELP RIGHT AWAY IF:   You have a fever.  You feel very sleepy.  You throw up (vomit) or have watery poop (diarrhea).  You feel sick and have muscle aches and pains.  You see red streaks on the skin coming from the infected area.  Your red area gets bigger or turns a dark color.  Your bone or joint under the infected area is painful after the skin heals.  Your infection comes back in the same area or different area.  You have a puffy (swollen) bump in the infected area.  You have new symptoms. MAKE SURE YOU:   Understand these instructions.  Will watch your condition.  Will get help right away if you are not doing well or get worse. Document Released: 03/13/2008 Document Revised: 03/26/2012 Document Reviewed: 12/11/2011 [facility] Patient Information 2014 [place], [place].

## 2014-01-10 NOTE — ED Notes (Signed)
MD at bedside. 

## 2014-01-10 NOTE — ED Notes (Signed)
Pt complains of bug bites on bilateral ankles

## 2014-01-10 NOTE — ED Provider Notes (Signed)
CSN: [id number]     Arrival date & time 01/10/14  2117 History   This chart was scribed for non-physician practitioner Sherrie George, PA-C,working with Leota Jacobsen, MD, by Neta Ehlers, ED Scribe. This patient was seen in room WA23/WA23 and the patient's care was started at 9:47 PM.  First MD Initiated Contact with Patient 01/10/14 2140     Chief Complaint  Patient presents with  . Insect Bite    The history is provided by the patient. No language interpreter was used.   HPI Comments: Theresa Cline is a 63 y.o. Female, with a h/o DM II, who presents to the Emergency Department complaining of suspected insect bites to her bilateral ankles. She reports the bites occurred two days ago while she was taking an indoor, afternoon class at the [facility]. She reports she did not notice the bites when they occurred, and she noticed the bites only when she experienced subsequent itching, swelling, and pain. She characterizes the pain as "constant" and "aching." Ms. Hatton reports the pain is increased with weight-bearing and ambulation. Nothing has improved the pain; she has used tramadol and Voltaren without relief. She denies any new numbess; she has diabetic neuropathy.  She has a h/o cardiac disease and renal cancer; she had a nephrectomy.  She denies a h/o gout. She reports she experienced edema while taking Gabapentin, and she continues to experience intermittent edema even though she has stopped taking the Gabapentin. The pt smokes a pack a week; she has smoked for approximately 50 years. She denies alcohol use.  Past Medical History  Diagnosis Date  . S/P carpal tunnel release   . Vertigo   . Tobacco abuse   . Encounter for long-term (current) use of other medications   . Osteoporosis   . Left acoustic neuroma     Hx of  . Diabetic peripheral neuropathy   . Mixed hyperlipidemia   . Primary osteoarthritis of both knees   . Chronic pain syndrome   . Obesity   . Hypertension    . CAD (coronary artery disease)   . Diabetes mellitus type II, controlled   . Bronchitis, acute   . Diabetes type 2, controlled   . Cancer     Renal s/p nephrectomy   Past Surgical History  Procedure Laterality Date  . Resection of left acoustic neuroma  1999  . Coronary angioplasty with stent placement  2004    Normal LV function  . Total abdominal hysterectomy w/ bilateral salpingoophorectomy  04/12/1988    For fibroid tumors; [place], [place]  . Carpal tunnel release  08/09/1998    Right  . Carpal tunnel release  09/08/1998    Left  . Removal lipoma      Right foot  . Cesarean section      x3   Family History  Problem Relation Age of Onset  . Leukemia Mother     CLL  . Diabetes type II Mother   . Other Mother     Hypercholesterolemia  . Osteoarthritis Mother   . COPD Mother   . Hypertension Mother   . Hodgkin's lymphoma Father   . Diabetes type II Sister   . Osteoporosis Sister   . Paranoid behavior Brother     Unsure if diagnosed with Schizophrenia  . Diabetes type II Sister   . Hypertension Sister   . Diabetes type II Sister   . Hypertension Sister   . Schizophrenia Sister   . Heart disease Sister  Enlarged heart  . Glaucoma Sister   . Alcohol abuse Sister   . Glaucoma Sister   . Alcohol abuse Sister   . Bipolar disorder Daughter   . Lupus Daughter     SLE   History  Substance Use Topics  . Smoking status: Current Every Day Smoker -- 1.00 packs/day for 40 years    Types: Cigarettes  . Smokeless tobacco: Never Used  . Alcohol Use: No   No OB history provided.  Review of Systems  Constitutional: Negative for fever.  Respiratory: Negative for shortness of breath.   Cardiovascular: Negative for chest pain and palpitations.  Skin: Positive for color change.  All other systems reviewed and are negative.   Allergies  Gabapentin; Lyrica; and Adhesive  Home Medications   Current Outpatient Rx  Name  Route  Sig  Dispense  Refill  . albuterol  (PROVENTIL HFA;VENTOLIN HFA) 108 (90 BASE) MCG/ACT inhaler   Inhalation   Inhale 2 puffs into the lungs every 6 (six) hours as needed for wheezing or shortness of breath.   1 Inhaler   2   . alendronate (FOSAMAX) 70 MG tablet   Oral   Take 70 mg by mouth every Sunday. Take with a full glass of water on an empty stomach.         Marland Kitchen amLODipine (NORVASC) 5 MG tablet   Oral   Take 5 mg by mouth daily.         Marland Kitchen aspirin EC 81 MG tablet   Oral   Take 81 mg by mouth daily.         Marland Kitchen atorvastatin (LIPITOR) 40 MG tablet   Oral   Take 40 mg by mouth daily.          . Cholecalciferol (VITAMIN D3) 2000 UNITS TABS   Oral   Take 1 tablet by mouth at bedtime.         . clonazePAM (KLONOPIN) 0.5 MG tablet   Oral   Take 0.5 mg by mouth at bedtime.         . diclofenac sodium (VOLTAREN) 1 % GEL   Topical   Apply 2 g topically 4 (four) times daily.         . DULoxetine (CYMBALTA) 30 MG capsule   Oral   Take 1 capsule (30 mg total) by mouth daily.   30 capsule   3   . fish oil-omega-3 fatty acids 1000 MG capsule   Oral   Take 1 g by mouth daily.           . fluticasone (FLONASE) 50 MCG/ACT nasal spray   Each Nare   Place 1 spray into both nostrils daily as needed for allergies or rhinitis.         . furosemide (LASIX) 20 MG tablet   Oral   Take 10 mg by mouth daily.         . metFORMIN (GLUCOPHAGE) 1000 MG tablet   Oral   Take 1,000 mg by mouth 2 (two) times daily with a meal.           . metoprolol (TOPROL-XL) 100 MG 24 hr tablet   Oral   Take 100 mg by mouth daily.           . nitroGLYCERIN (NITROLINGUAL) 0.4 MG/SPRAY spray   Sublingual   Place 1 spray under the tongue every 5 (five) minutes x 3 doses as needed for chest pain.         Marland Kitchen  omeprazole (PRILOSEC) 40 MG capsule   Oral   Take 40 mg by mouth daily.           . traMADol (ULTRAM) 50 MG tablet   Oral   Take 2 tablets (100 mg total) by mouth every 8 (eight) hours as needed for  moderate pain.   180 tablet   2   . valsartan-hydrochlorothiazide (DIOVAN-HCT) 160-12.5 MG per tablet   Oral   Take 1 tablet by mouth 2 (two) times daily.           . vitamin E 400 UNIT capsule   Oral   Take 400 Units by mouth daily.          Triage Vitals: BP 109/57  Pulse 80  Temp(Src) 98.2 F (36.8 C) (Oral)  Resp 18  Ht 5\' 3"  (1.6 m)  Wt 136 lb (61.689 kg)  BMI 24.10 kg/m2  SpO2 96%  Physical Exam  Nursing note and vitals reviewed. Constitutional: She is oriented to person, place, and time. She appears well-developed and well-nourished. No distress.  HENT:  Head: Normocephalic and atraumatic.  Eyes: EOM are normal.  Neck: Neck supple. No JVD present. No tracheal deviation present.  Cardiovascular: Normal rate, regular rhythm and intact distal pulses.  Exam reveals no gallop and no friction rub.   No murmur heard. Good cap refill.   Pulmonary/Chest: Effort normal. No respiratory distress. She has no wheezes. She has no rhonchi. She has no rales.  Musculoskeletal: Normal range of motion. She exhibits tenderness.  Warmth over lateral malleolus bilaterally, worse on the left. Erythema over the lateral malleolus bilaterally, worse on the left. Swelling over the latera malleolus bilaterally, worse on the left.  Tenderness to palpation of the soft tissue surrounding Left lateral ankle. No bony tenderness. No pain with ROM. Pedal pulses present bilaterally.Good capillary refill < 2 sec  Left ankle: Erythema spreads 3 inches past the ankle.   Neurological: She is alert and oriented to person, place, and time.  Skin: Skin is warm and dry. She is not diaphoretic.  No fissures between toes.   Psychiatric: She has a normal mood and affect. Her behavior is normal.    ED Course  Procedures (including critical care time)  DIAGNOSTIC STUDIES: Oxygen Saturation is 96% on room air, normal by my interpretation.    COORDINATION OF CARE:  10:01 PM- Discussed treatment plan with  patient, and the patient agreed to the plan. The plan includes antibiotics and antihistamines.   10:01 PM- Consulted with Dr. Freida Busman. Dr. Freida Busman assessed the pt.   Labs Review Labs Reviewed - No data to display Imaging Review No results found.   EKG Interpretation None      MDM   Final diagnoses:  None   Filed Vitals:   01/10/14 2137  BP: 109/57  Pulse: 80  Temp: 98.2 F (36.8 C)  TempSrc: Oral  Resp: 18  Height: 5\' 3"  (1.6 m)  Weight: 136 lb (61.689 kg)  SpO2: 96%   Patient afebrile with normal VS.  Given hx and exam, no concern for gout, septic joint, hemarthrosis, or reactive arthritis.  Patient does not appear to have any systemic symptoms currently. Cellulitis of left ankle noted, with no apparent joint involvement by examination.  Plan to treat patient's sxs and start on antibiotics. Patient ankle erythema outlined with skin marker. Plan to have patient return tomorrow for repeat evaluation to look for any evidence of spreading erythema. Patient agrees with this plan. Discharged in good condition  Meds given in ED:  Medications  hydrOXYzine (ATARAX/VISTARIL) tablet 25 mg (25 mg Oral Given 01/10/14 2235)  clindamycin (CLEOCIN) capsule 300 mg (300 mg Oral Given 01/10/14 2212)    Discharge Medication List as of 01/10/2014 10:15 PM    START taking these medications   Details  clindamycin (CLEOCIN) 150 MG capsule Take 2 capsules (300 mg total) by mouth 3 (three) times daily. May dispense as 150mg  capsules, Starting 01/10/2014, Until Discontinued, Print    hydrOXYzine (ATARAX/VISTARIL) 25 MG tablet Take 1 tablet (25 mg total) by mouth 3 (three) times daily., Starting 01/10/2014, Until Discontinued, Print       I personally performed the services described in this documentation, which was scribed in my presence. The recorded information has been reviewed and is accurate.   Sherrie George, PA-C 01/11/14 1905

## 2014-01-15 NOTE — ED Provider Notes (Signed)
Medical screening examination/treatment/procedure(s) were performed by non-physician practitioner and as supervising physician I was immediately available for consultation/collaboration.   Leota Jacobsen, MD 01/15/14 [phone]

## 2014-01-20 ENCOUNTER — Encounter: Payer: Self-pay | Admitting: Cardiovascular Disease

## 2014-01-20 ENCOUNTER — Ambulatory Visit (INDEPENDENT_AMBULATORY_CARE_PROVIDER_SITE_OTHER): Payer: Commercial Managed Care - HMO | Admitting: Cardiovascular Disease

## 2014-01-20 VITALS — BP 130/88 | HR 66 | Ht 63.0 in | Wt 148.0 lb

## 2014-01-20 DIAGNOSIS — I2510 Atherosclerotic heart disease of native coronary artery without angina pectoris: Secondary | ICD-10-CM | POA: Diagnosis not present

## 2014-01-20 DIAGNOSIS — R609 Edema, unspecified: Secondary | ICD-10-CM | POA: Diagnosis not present

## 2014-01-20 DIAGNOSIS — I714 Abdominal aortic aneurysm, without rupture, unspecified: Secondary | ICD-10-CM

## 2014-01-20 DIAGNOSIS — I739 Peripheral vascular disease, unspecified: Secondary | ICD-10-CM

## 2014-01-20 DIAGNOSIS — I10 Essential (primary) hypertension: Secondary | ICD-10-CM

## 2014-01-20 DIAGNOSIS — R600 Localized edema: Secondary | ICD-10-CM

## 2014-01-20 DIAGNOSIS — I779 Disorder of arteries and arterioles, unspecified: Secondary | ICD-10-CM

## 2014-01-20 NOTE — Patient Instructions (Signed)
Your physician wants you to follow-up in:  12 months.  You will receive a reminder letter in the mail two months in advance. If you don't receive a letter, please call our office to schedule the follow-up appointment.   

## 2014-01-20 NOTE — Progress Notes (Signed)
History of Present Illness: 63 yo AAF with h/o CAD s/p MI and stents x 3 in 2004, HTN, Hyperlipidemia, DM type II who presents today for cardiac follow up.  Her stress myoview in 2010 showed no ischemia. She is known to have mild carotid artery disease, last dopplers 2014. 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 I repeated her AAA u/s March 2014 and the AAA is small. I saw her in October 2012 after she had a syncopal event. She told me that she was sitting on the toilet and when she stood up, she passed out. This was following a bowel movement. She felt lightheaded when standing and passed out. She does have vertigo and had an acoustic neuroma resected in 1999. No recurrences after that. She is on Lasix for chronic lower ext edema. Echo February 2014 with normal LV function, no valve issues.   She is here today for follow up. She is doing well overall. She has no chest pain or SOB. LE edema is resolved on Lasix. She takes 10 mg daily. She was sent to see Dr. Einar Gip from primary care. She is not sure why. She had an echo in his office and was told this was for LE swelling. She has not heard the results of the echo yet.   Primary Care Physician: Ashby Dawes  Last Lipid Profile: Followed in primary care.   Past Medical History  Diagnosis Date  . S/P carpal tunnel release   . Vertigo   . Tobacco abuse   . Encounter for long-term (current) use of other medications   . Osteoporosis   . Left acoustic neuroma     Hx of  . Diabetic peripheral neuropathy   . Mixed hyperlipidemia   . Primary osteoarthritis of both knees   . Chronic pain syndrome   . Obesity   . Hypertension   . CAD (coronary artery disease)   . Diabetes mellitus type II, controlled   . Bronchitis, acute   . Diabetes type 2, controlled   . Cancer     Renal s/p nephrectomy    Past Surgical History  Procedure Laterality Date  . Resection of left acoustic neuroma  1999  .  Coronary angioplasty with stent placement  2004    Normal LV function  . Total abdominal hysterectomy w/ bilateral salpingoophorectomy  04/12/1988    For fibroid tumors; [place], [place]  . Carpal tunnel release  08/09/1998    Right  . Carpal tunnel release  09/08/1998    Left  . Removal lipoma      Right foot  . Cesarean section      x3    Current Outpatient Prescriptions  Medication Sig Dispense Refill  . albuterol (PROVENTIL HFA;VENTOLIN HFA) 108 (90 BASE) MCG/ACT inhaler Inhale 2 puffs into the lungs every 6 (six) hours as needed for wheezing or shortness of breath.  1 Inhaler  2  . alendronate (FOSAMAX) 70 MG tablet Take 70 mg by mouth every Sunday. Take with a full glass of water on an empty stomach.      Marland Kitchen amLODipine (NORVASC) 5 MG tablet Take 5 mg by mouth daily.      Marland Kitchen aspirin EC 81 MG tablet Take 81 mg by mouth daily.      Marland Kitchen atorvastatin (LIPITOR) 40 MG tablet Take 40 mg by mouth daily.       . Cholecalciferol (VITAMIN D3) 2000 UNITS TABS Take 1  tablet by mouth daily.       . citalopram (CELEXA) 40 MG tablet Take 1 tablet by mouth daily.      . clindamycin (CLEOCIN) 150 MG capsule Take 2 capsules (300 mg total) by mouth 3 (three) times daily. May dispense as 150mg  capsules  60 capsule  0  . clonazePAM (KLONOPIN) 0.5 MG tablet Take 0.5 mg by mouth at bedtime.      . diclofenac sodium (VOLTAREN) 1 % GEL Apply 2 g topically 4 (four) times daily.      . DULoxetine (CYMBALTA) 30 MG capsule Take 1 capsule (30 mg total) by mouth daily.  30 capsule  3  . fish oil-omega-3 fatty acids 1000 MG capsule Take 1 g by mouth daily.        . fluticasone (FLONASE) 50 MCG/ACT nasal spray Place 1 spray into both nostrils daily as needed for allergies or rhinitis.      . furosemide (LASIX) 20 MG tablet Take 10 mg by mouth daily.      . hydrOXYzine (ATARAX/VISTARIL) 25 MG tablet Take 1 tablet (25 mg total) by mouth 3 (three) times daily.  12 tablet  0  . metFORMIN (GLUCOPHAGE) 1000 MG tablet Take  1,000 mg by mouth 2 (two) times daily with a meal.        . metoprolol (TOPROL-XL) 100 MG 24 hr tablet Take 100 mg by mouth daily.        . nitroGLYCERIN (NITROLINGUAL) 0.4 MG/SPRAY spray Place 1 spray under the tongue every 5 (five) minutes x 3 doses as needed for chest pain.      Marland Kitchen omeprazole (PRILOSEC) 40 MG capsule Take 40 mg by mouth daily.        . traMADol (ULTRAM) 50 MG tablet Take 2 tablets (100 mg total) by mouth every 8 (eight) hours as needed for moderate pain.  180 tablet  2  . valsartan-hydrochlorothiazide (DIOVAN-HCT) 320-25 MG per tablet Take 1 tablet by mouth at bedtime.      . vitamin E 400 UNIT capsule Take 400 Units by mouth daily.       No current facility-administered medications for this visit.    Allergies  Allergen Reactions  . Gabapentin Swelling  . Lyrica [Pregabalin] Swelling  . Adhesive [Tape] Rash    History   Social History  . Marital Status: Divorced    Spouse Name: N/A    Number of Children: N/A  . Years of Education: N/A   Occupational History  . 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 Smoking status: Current Every Day Smoker -- 1.00 packs/day for 40 years    Types: Cigarettes  . Smokeless tobacco: Never Used  . Alcohol Use: No  . Drug Use: No  . Sexual Activity: Not on file   Other Topics Concern  . Not on file   Social History Narrative   Divorced   Disability mainly for back and chronic intermittent vertigo - since tumor removed - no surgical correction for back   Lives alone, keeps her grandson, 39 yr old daughter is a mother    Family History  Problem Relation Age of Onset  . Leukemia Mother     CLL  . Diabetes type II Mother   . Other Mother     Hypercholesterolemia  . Osteoarthritis Mother   . COPD Mother   . Hypertension Mother   . Hodgkin's lymphoma  Father   . Diabetes type II Sister   . Osteoporosis Sister   . Paranoid behavior Brother     Unsure if diagnosed  with Schizophrenia  . Diabetes type II Sister   . Hypertension Sister   . Diabetes type II Sister   . Hypertension Sister   . Schizophrenia Sister   . Heart disease Sister     Enlarged heart  . Glaucoma Sister   . Alcohol abuse Sister   . Glaucoma Sister   . Alcohol abuse Sister   . Bipolar disorder Daughter   . Lupus Daughter     SLE    Review of Systems:  As stated in the HPI and otherwise negative.   BP 130/88  Pulse 66  Ht 5\' 3"  (1.6 m)  Wt 148 lb (67.132 kg)  BMI 26.22 kg/m2  Physical Examination: General: Well developed, well nourished, NAD HEENT: OP clear, mucus membranes moist SKIN: warm, dry. No rashes. Neuro: No focal deficits Musculoskeletal: Muscle strength 5/5 all ext Psychiatric: Mood and affect normal Neck: No JVD, no carotid bruits, no thyromegaly, no lymphadenopathy. Lungs:Clear bilaterally, no wheezes, rhonci, crackles Cardiovascular: Regular rate and rhythm. No murmurs, gallops or rubs. Abdomen:Soft. Bowel sounds present. Non-tender.  Extremities: No lower extremity edema. Pulses are 2 + in the bilateral DP/PT.  Echo 11/18/12: Left ventricle: The cavity size was normal. Wall thickness was normal. Systolic function was normal. The estimated ejection fraction was in the range of 55% to 60%. Wall motion was normal; there were no regional wall motion abnormalities. Doppler parameters are consistent with abnormal left ventricular relaxation (grade 1 diastolic dysfunction). - Atrial septum: There was redundancy of the septum, with borderline criteria for aneurysm. - Pulmonary arteries: Systolic pressure was mildly increased. PA peak pressure: 60mm Hg (S).  Assessment and Plan:   1. CAD: Stable. Continue current therapy. She is on an ASA. No changes. Lipids are followed in primary care. Will get echo from Dr. Irven Shelling office.   2. Carotid artery disease: Mild disease by dopplers March 2014. Repeat March 2016. Will arrange at f/u in one year.    3.  AAA: Known history of AAA. Mild by u/s March 2014. Repeat March 2016.    4. Lower extremity edema: LE edema is better on scheduled Lasix.  She will use extra as needed.   5. HTN: BP controlled. No changes.

## 2014-01-21 ENCOUNTER — Encounter: Payer: Commercial Managed Care - HMO | Admitting: Podiatry

## 2014-01-21 NOTE — Progress Notes (Signed)
Patient presents to pick up diabetic shoes and insoles. Patient tried shoes on and said they felt tight. I will send these back and reorder a size 9 XW. Will contact pt when they come in.

## 2014-01-27 ENCOUNTER — Telehealth: Payer: Self-pay | Admitting: Cardiovascular Disease

## 2014-01-27 NOTE — Telephone Encounter (Signed)
ROI faxed to Dr.J Einar Gip [phone]

## 2014-02-02 ENCOUNTER — Encounter: Payer: Self-pay | Admitting: Physical Medicine & Rehabilitation

## 2014-02-02 ENCOUNTER — Encounter: Payer: Medicare PPO | Attending: Physical Medicine & Rehabilitation

## 2014-02-02 ENCOUNTER — Ambulatory Visit (HOSPITAL_BASED_OUTPATIENT_CLINIC_OR_DEPARTMENT_OTHER): Payer: Medicare PPO | Admitting: Physical Medicine & Rehabilitation

## 2014-02-02 VITALS — BP 150/87 | HR 69 | Resp 14 | Ht 63.0 in | Wt 154.0 lb

## 2014-02-02 DIAGNOSIS — IMO0002 Reserved for concepts with insufficient information to code with codable children: Secondary | ICD-10-CM

## 2014-02-02 DIAGNOSIS — M1710 Unilateral primary osteoarthritis, unspecified knee: Secondary | ICD-10-CM

## 2014-02-02 DIAGNOSIS — G894 Chronic pain syndrome: Secondary | ICD-10-CM | POA: Insufficient documentation

## 2014-02-02 DIAGNOSIS — M48061 Spinal stenosis, lumbar region without neurogenic claudication: Secondary | ICD-10-CM

## 2014-02-02 DIAGNOSIS — M7120 Synovial cyst of popliteal space [Baker], unspecified knee: Secondary | ICD-10-CM | POA: Insufficient documentation

## 2014-02-02 DIAGNOSIS — M76899 Other specified enthesopathies of unspecified lower limb, excluding foot: Secondary | ICD-10-CM | POA: Insufficient documentation

## 2014-02-02 DIAGNOSIS — M48062 Spinal stenosis, lumbar region with neurogenic claudication: Secondary | ICD-10-CM | POA: Insufficient documentation

## 2014-02-02 NOTE — Progress Notes (Signed)
Subjective:    Patient ID: Theresa Cline, female    DOB: 09/23/1951, 63 y.o.   MRN: [id number]  HPI Asian complains of left knee pain. History of left knee osteoarthritis had a left knee injection in August of 2013, doing relatively well until she had to kneel on the back of the seat getting out of an SUV. Left knee has been painful since that time. This occurred about 5 days ago Right knee is not painful Hip not painful DM controlled Pain Inventory Average Pain 8 Pain Right Now 7 My pain is constant, stabbing and aching  In the last 24 hours, has pain interfered with the following? General activity 7 Relation with others 7 Enjoyment of life 7 What TIME of day is your pain at its worst? constant Sleep (in general) Poor  Pain is worse with: walking and standing Pain improves with: therapy/exercise and pacing activities Relief from Meds: 2  Mobility use a cane how many minutes can you walk? 20 ability to climb steps?  no do you drive?  no  Function not employed: date last employed 06/99 disabled: date disabled 05/99 I need assistance with the following:  household duties and shopping  Neuro/Psych bladder control problems weakness numbness tremor tingling spasms dizziness confusion anxiety loss of taste or smell  Prior Studies Any changes since last visit?  no  Physicians involved in your care Any changes since last visit?  no   Family History  Problem Relation Age of Onset  . Leukemia Mother     CLL  . Diabetes type II Mother   . Other Mother     Hypercholesterolemia  . Osteoarthritis Mother   . COPD Mother   . Hypertension Mother   . Hodgkin's lymphoma Father   . Diabetes type II Sister   . Osteoporosis Sister   . Paranoid behavior Brother     Unsure if diagnosed with Schizophrenia  . Diabetes type II Sister   . Hypertension Sister   . Diabetes type II Sister   . Hypertension Sister   . Schizophrenia Sister   . Heart disease Sister    Enlarged heart  . Glaucoma Sister   . Alcohol abuse Sister   . Glaucoma Sister   . Alcohol abuse Sister   . Bipolar disorder Daughter   . Lupus Daughter     SLE   History   Social History  . Marital Status: Divorced    Spouse Name: N/A    Number of Children: N/A  . Years of Education: N/A   Occupational History  . 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 Smoking status: Current Every Day Smoker -- 1.00 packs/day for 40 years    Types: Cigarettes  . Smokeless tobacco: Never Used  . Alcohol Use: No  . Drug Use: No  . Sexual Activity: None   Other Topics Concern  . None   Social History Narrative   Divorced   Disability mainly for back and chronic intermittent vertigo - since tumor removed - no surgical correction for back   Lives alone, keeps her grandson, 78 yr old daughter is a mother   Past Surgical History  Procedure Laterality Date  . Resection of left acoustic neuroma  1999  . Coronary angioplasty with stent placement  2004    Normal LV function  . Total abdominal hysterectomy w/ bilateral salpingoophorectomy  04/12/1988  For fibroid tumors; [place], [place]  . Carpal tunnel release  08/09/1998    Right  . Carpal tunnel release  09/08/1998    Left  . Removal lipoma      Right foot  . Cesarean section      x3   Past Medical History  Diagnosis Date  . S/P carpal tunnel release   . Vertigo   . Tobacco abuse   . Encounter for long-term (current) use of other medications   . Osteoporosis   . Left acoustic neuroma     Hx of  . Diabetic peripheral neuropathy   . Mixed hyperlipidemia   . Primary osteoarthritis of both knees   . Chronic pain syndrome   . Obesity   . Hypertension   . CAD (coronary artery disease)   . Diabetes mellitus type II, controlled   . Bronchitis, acute   . Diabetes type 2, controlled   . Cancer     Renal s/p nephrectomy   BP 150/87  Pulse 69  Resp 14  Ht 5\' 3"  (1.6 m)  Wt  154 lb (69.854 kg)  BMI 27.29 kg/m2  SpO2 97%  Opioid Risk Score:   Fall Risk Score: High Fall Risk (>13 points) (patient educated handout declined)   Review of Systems  Constitutional: Positive for appetite change and unexpected weight change.  Gastrointestinal: Positive for nausea and vomiting.  Genitourinary: Positive for difficulty urinating.  Neurological: Positive for dizziness, tremors, weakness and numbness.  Hematological: Bruises/bleeds easily.  Psychiatric/Behavioral: The patient is nervous/anxious.   All other systems reviewed and are negative.      Objective:   Physical Exam Tenderness to palpation bilateral lumbar paraspinal muscles Tenderness to palpation bilateral hip greater trochanter Tenderness to palpation left medial and lateral joint line greater than right medial and lateral joint line of the knee Mild left knee effusion 1+ bilateral pretibial       Assessment & Plan:  1. Lumbar spinal stenosis chronic pain stable  2.  Osteoarthritis bilateral knees left knee with exacerbation. Patient is now ready to return to orthopedics to schedule for left knee replacement. She will contact Dr. Veverly Fells

## 2014-02-02 NOTE — Patient Instructions (Signed)
If you do undergo knee surgery Dr. Veverly Fells we will prescribe your post operative pain medicines. This will likely be something stronger than the tramadol for the first couple months

## 2014-02-09 ENCOUNTER — Ambulatory Visit (INDEPENDENT_AMBULATORY_CARE_PROVIDER_SITE_OTHER): Payer: Commercial Managed Care - HMO | Admitting: Podiatry

## 2014-02-09 ENCOUNTER — Encounter: Payer: Self-pay | Admitting: Podiatry

## 2014-02-09 DIAGNOSIS — M2040 Other hammer toe(s) (acquired), unspecified foot: Secondary | ICD-10-CM

## 2014-02-09 DIAGNOSIS — E1149 Type 2 diabetes mellitus with other diabetic neurological complication: Secondary | ICD-10-CM

## 2014-02-09 NOTE — Progress Notes (Signed)
Dispensed diabetic shoes and 3 pairs of insoles with instructions for wearing.  

## 2014-02-11 NOTE — Progress Notes (Signed)
Subjective:     Patient ID: Theresa Cline, female   DOB: 02-Sep-1951, 63 y.o.   MRN: [id number]  HPI patient presents stating I'm here to pickup my diabetic shoes   Review of Systems     Objective:   Physical Exam Neurovascular status unchanged with structural deformity noted and history of pre-ulcerated type callus formation    Assessment:     At risk diabetic here to pickup diabetic shoes    Plan:     Diabetic shoes were dispensed with instructions on usage and reappoint in the next several months earlier if any issues should occur

## 2014-03-19 ENCOUNTER — Other Ambulatory Visit: Payer: Self-pay | Admitting: Internal Medicine

## 2014-03-19 DIAGNOSIS — R55 Syncope and collapse: Secondary | ICD-10-CM

## 2014-03-20 ENCOUNTER — Other Ambulatory Visit (HOSPITAL_COMMUNITY): Payer: Self-pay | Admitting: Urology

## 2014-03-20 ENCOUNTER — Ambulatory Visit (HOSPITAL_COMMUNITY)
Admission: RE | Admit: 2014-03-20 | Discharge: 2014-03-20 | Disposition: A | Discharge status: Home / Self Care | Payer: Medicare PPO | Source: Ambulatory Visit | Attending: Urology | Admitting: Urology

## 2014-03-20 DIAGNOSIS — C649 Malignant neoplasm of unspecified kidney, except renal pelvis: Secondary | ICD-10-CM

## 2014-03-20 DIAGNOSIS — Z85528 Personal history of other malignant neoplasm of kidney: Secondary | ICD-10-CM | POA: Diagnosis present

## 2014-03-25 ENCOUNTER — Telehealth: Payer: Self-pay | Admitting: Cardiovascular Disease

## 2014-03-25 NOTE — Telephone Encounter (Signed)
[facility] [place] gave to [facility] 6.17.15/km

## 2014-04-14 ENCOUNTER — Ambulatory Visit
Admission: RE | Admit: 2014-04-14 | Discharge: 2014-04-14 | Disposition: A | Discharge status: Home / Self Care | Payer: Commercial Managed Care - HMO | Source: Ambulatory Visit | Attending: Internal Medicine | Admitting: Internal Medicine

## 2014-04-14 DIAGNOSIS — R55 Syncope and collapse: Secondary | ICD-10-CM

## 2014-04-20 ENCOUNTER — Other Ambulatory Visit: Payer: Self-pay | Admitting: Physical Medicine & Rehabilitation

## 2014-04-20 MED ORDER — TRAMADOL HCL 50 MG PO TABS: ORAL_TABLET | ORAL | Status: DC

## 2014-04-20 NOTE — Addendum Note (Signed)
Addended by: Amado Coe on: 04/20/2014 03:52 PM   Modules accepted: Orders

## 2014-04-21 ENCOUNTER — Other Ambulatory Visit: Payer: Self-pay

## 2014-04-21 MED ORDER — TRAMADOL HCL 50 MG PO TABS: ORAL_TABLET | ORAL | Status: DC

## 2014-05-04 ENCOUNTER — Encounter: Payer: Medicare PPO | Attending: Physical Medicine & Rehabilitation

## 2014-05-04 ENCOUNTER — Encounter: Payer: Self-pay | Admitting: Physical Medicine & Rehabilitation

## 2014-05-04 ENCOUNTER — Ambulatory Visit (HOSPITAL_BASED_OUTPATIENT_CLINIC_OR_DEPARTMENT_OTHER): Payer: Commercial Managed Care - HMO | Admitting: Physical Medicine & Rehabilitation

## 2014-05-04 VITALS — BP 182/104 | HR 73 | Resp 14 | Ht 63.0 in | Wt 139.0 lb

## 2014-05-04 DIAGNOSIS — F17200 Nicotine dependence, unspecified, uncomplicated: Secondary | ICD-10-CM | POA: Diagnosis not present

## 2014-05-04 DIAGNOSIS — IMO0002 Reserved for concepts with insufficient information to code with codable children: Secondary | ICD-10-CM | POA: Insufficient documentation

## 2014-05-04 DIAGNOSIS — M1710 Unilateral primary osteoarthritis, unspecified knee: Secondary | ICD-10-CM | POA: Insufficient documentation

## 2014-05-04 DIAGNOSIS — E1149 Type 2 diabetes mellitus with other diabetic neurological complication: Secondary | ICD-10-CM | POA: Insufficient documentation

## 2014-05-04 DIAGNOSIS — I10 Essential (primary) hypertension: Secondary | ICD-10-CM | POA: Diagnosis not present

## 2014-05-04 DIAGNOSIS — M48061 Spinal stenosis, lumbar region without neurogenic claudication: Secondary | ICD-10-CM | POA: Diagnosis not present

## 2014-05-04 DIAGNOSIS — E785 Hyperlipidemia, unspecified: Secondary | ICD-10-CM | POA: Insufficient documentation

## 2014-05-04 DIAGNOSIS — E1142 Type 2 diabetes mellitus with diabetic polyneuropathy: Secondary | ICD-10-CM | POA: Insufficient documentation

## 2014-05-04 DIAGNOSIS — G894 Chronic pain syndrome: Secondary | ICD-10-CM | POA: Insufficient documentation

## 2014-05-04 DIAGNOSIS — M7062 Trochanteric bursitis, left hip: Secondary | ICD-10-CM

## 2014-05-04 DIAGNOSIS — M76899 Other specified enthesopathies of unspecified lower limb, excluding foot: Secondary | ICD-10-CM

## 2014-05-04 DIAGNOSIS — I2510 Atherosclerotic heart disease of native coronary artery without angina pectoris: Secondary | ICD-10-CM | POA: Insufficient documentation

## 2014-05-04 DIAGNOSIS — M7061 Trochanteric bursitis, right hip: Secondary | ICD-10-CM

## 2014-05-04 NOTE — Patient Instructions (Addendum)
Continue current tramadol  Call if you need another injection

## 2014-05-04 NOTE — Progress Notes (Signed)
Subjective:    Patient ID: Theresa Cline, female    DOB: 04-Mar-1951, 63 y.o.   MRN: [id number]  HPI Followed up with orthopedic surgery about a week ago. No surgery advised that the current time. Received left cortical steroid injection in the knee. May have surgery in about one year. Back is doing okay Denies any radiating pain down the legs. Diabetic neuropathy pain better with Cymbalta. Now is off Celexa. Has podiatry followup for foot calluses Pain Inventory Average Pain 7 Pain Right Now 6 My pain is dull  In the last 24 hours, has pain interfered with the following? General activity 8 Relation with others 10 Enjoyment of life 9 What TIME of day is your pain at its worst? constant Sleep (in general) Poor  Pain is worse with: bending, standing and some activites Pain improves with: therapy/exercise Relief from Meds: n/a  Mobility use a cane how many minutes can you walk? 30 ability to climb steps?  no do you drive?  no  Function disabled: date disabled . I need assistance with the following:  shopping  Neuro/Psych weakness dizziness depression anxiety  Prior Studies Any changes since last visit?  no  Physicians involved in your care Any changes since last visit?  no   Family History  Problem Relation Age of Onset  . Leukemia Mother     CLL  . Diabetes type II Mother   . Other Mother     Hypercholesterolemia  . Osteoarthritis Mother   . COPD Mother   . Hypertension Mother   . Hodgkin's lymphoma Father   . Diabetes type II Sister   . Osteoporosis Sister   . Paranoid behavior Brother     Unsure if diagnosed with Schizophrenia  . Diabetes type II Sister   . Hypertension Sister   . Diabetes type II Sister   . Hypertension Sister   . Schizophrenia Sister   . Heart disease Sister     Enlarged heart  . Glaucoma Sister   . Alcohol abuse Sister   . Glaucoma Sister   . Alcohol abuse Sister   . Bipolar disorder Daughter   . Lupus Daughter    SLE   History   Social History  . Marital Status: Divorced    Spouse Name: N/A    Number of Children: N/A  . Years of Education: N/A   Occupational History  . 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 Smoking status: Current Every Day Smoker -- 1.00 packs/day for 40 years    Types: Cigarettes  . Smokeless tobacco: Never Used  . Alcohol Use: No  . Drug Use: No  . Sexual Activity: None   Other Topics Concern  . None   Social History Narrative   Divorced   Disability mainly for back and chronic intermittent vertigo - since tumor removed - no surgical correction for back   Lives alone, keeps her grandson, 31 yr old daughter is a mother   Past Surgical History  Procedure Laterality Date  . Resection of left acoustic neuroma  1999  . Coronary angioplasty with stent placement  2004    Normal LV function  . Total abdominal hysterectomy w/ bilateral salpingoophorectomy  04/12/1988    For fibroid tumors; [place], [place]  . Carpal tunnel release  08/09/1998    Right  . Carpal tunnel release  09/08/1998    Left  . Removal  lipoma      Right foot  . Cesarean section      x3   Past Medical History  Diagnosis Date  . S/P carpal tunnel release   . Vertigo   . Tobacco abuse   . Encounter for long-term (current) use of other medications   . Osteoporosis   . Left acoustic neuroma     Hx of  . Diabetic peripheral neuropathy   . Mixed hyperlipidemia   . Primary osteoarthritis of both knees   . Chronic pain syndrome   . Obesity   . Hypertension   . CAD (coronary artery disease)   . Diabetes mellitus type II, controlled   . Bronchitis, acute   . Diabetes type 2, controlled   . Cancer     Renal s/p nephrectomy   BP 182/104  Pulse 73  Resp 14  Ht 5\' 3"  (1.6 m)  Wt 139 lb (63.05 kg)  BMI 24.63 kg/m2  SpO2 98%  Opioid Risk Score:   Fall Risk Score: Moderate Fall Risk (6-13 points) (patient educated handout  declined)   Review of Systems  Constitutional: Positive for appetite change.  Respiratory: Positive for shortness of breath and wheezing.   Gastrointestinal: Positive for nausea and vomiting.  Neurological: Positive for dizziness and weakness.  Psychiatric/Behavioral: Positive for dysphoric mood. The patient is nervous/anxious.   All other systems reviewed and are negative.      Objective:   Physical Exam  Nursing note and vitals reviewed. Constitutional: She is oriented to person, place, and time. She appears well-developed and well-nourished.  HENT:  Head: Normocephalic and atraumatic.  Eyes: Conjunctivae and EOM are normal. Pupils are equal, round, and reactive to light.  Neck: Normal range of motion.  Musculoskeletal:       Lumbar back: She exhibits decreased range of motion.  Lumbar range of motion 75% flexion extension lateral rotation and bending.  Left knee has tenderness along the medial and lateral joint line as well as the left lateral hamstrings tendon as well as the patellar tendon   Right knee without tenderness to palpation   No evidence of knee effusion or erythema  Neurological: She is alert and oriented to person, place, and time. Gait abnormal.  Neg SLR Motor 5/5 In bilateral deltoid,biceps, triceps, grip Floor/5 bilateral hip flexors knee extensors ankle dorsiflexors  Ambulates with a quad cane.   Psychiatric: She has a normal mood and affect.          Assessment & Plan:  1.  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 Has chronic pain which is relieved by tramadol. No signs of serotonin syndrome. She is actually on a smaller SSRI dose now that she switch him Celexa 40 mg to Cymbalta 30 mg   2.  Left knee OA-followup with Dr. Veverly Fells from orthopedics. Has tenderness but no swelling. Last injection performed about a week ago.   3.  Diabetic Neuropathy now on Cymbalta doing better  Return to clinic 6 months. Patient is to  call if she needs an injection

## 2014-05-14 ENCOUNTER — Other Ambulatory Visit: Payer: Self-pay | Admitting: Physical Medicine & Rehabilitation

## 2014-06-15 ENCOUNTER — Encounter (HOSPITAL_COMMUNITY): Payer: Self-pay | Admitting: Emergency Medicine

## 2014-06-15 ENCOUNTER — Emergency Department (HOSPITAL_COMMUNITY)
Admission: EM | Admit: 2014-06-15 | Discharge: 2014-06-16 | Disposition: A | Discharge status: Home / Self Care | Payer: Medicare PPO | Attending: Emergency Medicine | Admitting: Emergency Medicine

## 2014-06-15 DIAGNOSIS — I10 Essential (primary) hypertension: Secondary | ICD-10-CM | POA: Diagnosis not present

## 2014-06-15 DIAGNOSIS — E782 Mixed hyperlipidemia: Secondary | ICD-10-CM | POA: Insufficient documentation

## 2014-06-15 DIAGNOSIS — M1710 Unilateral primary osteoarthritis, unspecified knee: Secondary | ICD-10-CM | POA: Diagnosis not present

## 2014-06-15 DIAGNOSIS — L03115 Cellulitis of right lower limb: Secondary | ICD-10-CM

## 2014-06-15 DIAGNOSIS — Z792 Long term (current) use of antibiotics: Secondary | ICD-10-CM | POA: Insufficient documentation

## 2014-06-15 DIAGNOSIS — L02419 Cutaneous abscess of limb, unspecified: Secondary | ICD-10-CM | POA: Insufficient documentation

## 2014-06-15 DIAGNOSIS — Z9889 Other specified postprocedural states: Secondary | ICD-10-CM | POA: Insufficient documentation

## 2014-06-15 DIAGNOSIS — G909 Disorder of the autonomic nervous system, unspecified: Secondary | ICD-10-CM | POA: Insufficient documentation

## 2014-06-15 DIAGNOSIS — L03119 Cellulitis of unspecified part of limb: Secondary | ICD-10-CM | POA: Diagnosis not present

## 2014-06-15 DIAGNOSIS — G8929 Other chronic pain: Secondary | ICD-10-CM | POA: Diagnosis not present

## 2014-06-15 DIAGNOSIS — F17200 Nicotine dependence, unspecified, uncomplicated: Secondary | ICD-10-CM | POA: Insufficient documentation

## 2014-06-15 DIAGNOSIS — L259 Unspecified contact dermatitis, unspecified cause: Secondary | ICD-10-CM | POA: Diagnosis present

## 2014-06-15 DIAGNOSIS — E669 Obesity, unspecified: Secondary | ICD-10-CM | POA: Diagnosis not present

## 2014-06-15 DIAGNOSIS — Z79899 Other long term (current) drug therapy: Secondary | ICD-10-CM | POA: Insufficient documentation

## 2014-06-15 DIAGNOSIS — M810 Age-related osteoporosis without current pathological fracture: Secondary | ICD-10-CM | POA: Insufficient documentation

## 2014-06-15 DIAGNOSIS — Z9861 Coronary angioplasty status: Secondary | ICD-10-CM | POA: Diagnosis not present

## 2014-06-15 DIAGNOSIS — IMO0002 Reserved for concepts with insufficient information to code with codable children: Secondary | ICD-10-CM | POA: Insufficient documentation

## 2014-06-15 DIAGNOSIS — Z85528 Personal history of other malignant neoplasm of kidney: Secondary | ICD-10-CM | POA: Insufficient documentation

## 2014-06-15 DIAGNOSIS — E1149 Type 2 diabetes mellitus with other diabetic neurological complication: Secondary | ICD-10-CM | POA: Insufficient documentation

## 2014-06-15 DIAGNOSIS — Z8709 Personal history of other diseases of the respiratory system: Secondary | ICD-10-CM | POA: Insufficient documentation

## 2014-06-15 DIAGNOSIS — Z7982 Long term (current) use of aspirin: Secondary | ICD-10-CM | POA: Insufficient documentation

## 2014-06-15 DIAGNOSIS — I2510 Atherosclerotic heart disease of native coronary artery without angina pectoris: Secondary | ICD-10-CM | POA: Diagnosis not present

## 2014-06-15 NOTE — ED Notes (Signed)
Per patient-has had 4 recurrent cellulitis infections on bilateral lower extremities since April. Saw PCP 8/28 and was prescribed antibiotics and told "I had some kind of bacterial infection." Was prescribed Keflex (has 3 more tablets until completion) and Hibiclens cleanser. Bilateral ankles are swollen and visibly reddened. Denies F, V, D, chills. Hx vertigo. Awaiting PA/MD.

## 2014-06-16 ENCOUNTER — Emergency Department (HOSPITAL_COMMUNITY): Payer: Medicare PPO

## 2014-06-16 DIAGNOSIS — L02419 Cutaneous abscess of limb, unspecified: Secondary | ICD-10-CM | POA: Diagnosis not present

## 2014-06-16 LAB — CBC
HEMATOCRIT: 36.6 % (ref 36.0–46.0)
Hemoglobin: 12 g/dL (ref 12.0–15.0)
MCH: 28.3 pg (ref 26.0–34.0)
MCHC: 32.8 g/dL (ref 30.0–36.0)
MCV: 86.3 fL (ref 78.0–100.0)
Platelets: 208 10*3/uL (ref 150–400)
RBC: 4.24 MIL/uL (ref 3.87–5.11)
RDW: 13.3 % (ref 11.5–15.5)
WBC: 7 10*3/uL (ref 4.0–10.5)

## 2014-06-16 MED ORDER — DOXYCYCLINE HYCLATE 100 MG PO TABS
100.0000 mg | ORAL_TABLET | Freq: Once | ORAL | Status: AC
  Administered 2014-06-16: 100 mg via ORAL
  Filled 2014-06-16: qty 1

## 2014-06-16 MED ORDER — FAMOTIDINE 20 MG PO TABS: 20.0000 mg | ORAL_TABLET | Freq: Two times a day (BID) | ORAL | Status: DC

## 2014-06-16 MED ORDER — DOXYCYCLINE HYCLATE 100 MG PO TABS: 100.0000 mg | ORAL_TABLET | Freq: Two times a day (BID) | ORAL | Status: DC

## 2014-06-16 MED ORDER — FAMOTIDINE 20 MG PO TABS
20.0000 mg | ORAL_TABLET | Freq: Once | ORAL | Status: AC
Start: 2014-06-16 — End: 2014-06-16
  Administered 2014-06-16: 20 mg via ORAL
  Filled 2014-06-16: qty 1

## 2014-06-16 NOTE — ED Provider Notes (Signed)
Medical screening examination/treatment/procedure(s) were performed by non-physician practitioner and as supervising physician I was immediately available for consultation/collaboration.   EKG Interpretation None       Lacresia Darwish K Starlin Steib-Rasch, MD 06/16/14 [phone]

## 2014-06-16 NOTE — ED Provider Notes (Signed)
CSN: [id number]     Arrival date & time 06/15/14  1815 History   First MD Initiated Contact with Patient 06/16/14 0049     Chief Complaint  Patient presents with  . Pruritis  . Recurrent Skin Infections     (Consider location/radiation/quality/duration/timing/severity/associated sxs/prior Treatment) HPI Comments: PAtient with recurrent cellulitis of R lower leg currently on Keflex but worsening redness and itching   The history is provided by the patient.    Past Medical History  Diagnosis Date  . S/P carpal tunnel release   . Vertigo   . Tobacco abuse   . Encounter for long-term (current) use of other medications   . Osteoporosis   . Left acoustic neuroma     Hx of  . Diabetic peripheral neuropathy   . Mixed hyperlipidemia   . Primary osteoarthritis of both knees   . Chronic pain syndrome   . Obesity   . Hypertension   . CAD (coronary artery disease)   . Diabetes mellitus type II, controlled   . Bronchitis, acute   . Diabetes type 2, controlled   . Cancer     Renal s/p nephrectomy   Past Surgical History  Procedure Laterality Date  . Resection of left acoustic neuroma  1999  . Coronary angioplasty with stent placement  2004    Normal LV function  . Total abdominal hysterectomy w/ bilateral salpingoophorectomy  04/12/1988    For fibroid tumors; [place], [place]  . Carpal tunnel release  08/09/1998    Right  . Carpal tunnel release  09/08/1998    Left  . Removal lipoma      Right foot  . Cesarean section      x3   Family History  Problem Relation Age of Onset  . Leukemia Mother     CLL  . Diabetes type II Mother   . Other Mother     Hypercholesterolemia  . Osteoarthritis Mother   . COPD Mother   . Hypertension Mother   . Hodgkin's lymphoma Father   . Diabetes type II Sister   . Osteoporosis Sister   . Paranoid behavior Brother     Unsure if diagnosed with Schizophrenia  . Diabetes type II Sister   . Hypertension Sister   . Diabetes type II Sister     . Hypertension Sister   . Schizophrenia Sister   . Heart disease Sister     Enlarged heart  . Glaucoma Sister   . Alcohol abuse Sister   . Glaucoma Sister   . Alcohol abuse Sister   . Bipolar disorder Daughter   . Lupus Daughter     SLE   History  Substance Use Topics  . Smoking status: Current Every Day Smoker -- 1.00 packs/day for 40 years    Types: Cigarettes  . Smokeless tobacco: Never Used  . Alcohol Use: No   OB History   Grav Para Term Preterm Abortions TAB SAB Ect Mult Living                 Review of Systems  Constitutional: Negative for fever.  Skin: Positive for color change. Negative for wound.  Neurological: Negative for headaches.  All other systems reviewed and are negative.     Allergies  Gabapentin; Lyrica; and Adhesive  Home Medications   Prior to Admission medications   Medication Sig Start Date End Date Taking? Authorizing Provider  alendronate (FOSAMAX) 70 MG tablet Take 70 mg by mouth once a week. Take with a full  glass of water on an empty stomach.   Yes Historical Provider, MD  aspirin EC 81 MG tablet Take 81 mg by mouth daily.   Yes Historical Provider, MD  atorvastatin (LIPITOR) 40 MG tablet Take 40 mg by mouth daily.  08/19/12  Yes Historical Provider, MD  cephALEXin (KEFLEX) 500 MG capsule Take 500 mg by mouth 3 (three) times daily. 10 day therapy course patient began on 06/05/2014 06/05/14  Yes Historical Provider, MD  Cholecalciferol (VITAMIN D) 2000 UNITS CAPS Take 2,000 Units by mouth daily.   Yes Historical Provider, MD  clonazePAM (KLONOPIN) 0.5 MG tablet Take 0.5 mg by mouth at bedtime.   Yes Historical Provider, MD  diclofenac sodium (VOLTAREN) 1 % GEL Apply 2 g topically 4 (four) times daily as needed (for pain).    Yes Historical Provider, MD  DULoxetine (CYMBALTA) 30 MG capsule Take 30 mg by mouth daily.   Yes Historical Provider, MD  fish oil-omega-3 fatty acids 1000 MG capsule Take 1 g by mouth daily.     Yes Historical  Provider, MD  fluticasone (FLONASE) 50 MCG/ACT nasal spray Place 1 spray into both nostrils daily as needed for allergies or rhinitis.   Yes Historical Provider, MD  metFORMIN (GLUCOPHAGE) 1000 MG tablet Take 1,000 mg by mouth 2 (two) times daily with a meal.     Yes Historical Provider, MD  metoprolol (TOPROL-XL) 100 MG 24 hr tablet Take 100 mg by mouth daily.     Yes Historical Provider, MD  Multiple Vitamin (MULTIVITAMIN WITH MINERALS) TABS tablet Take 1 tablet by mouth daily. Centrum 50 +   Yes Historical Provider, MD  nitroGLYCERIN (NITROLINGUAL) 0.4 MG/SPRAY spray Place 1 spray under the tongue every 5 (five) minutes x 3 doses as needed for chest pain.   Yes Historical Provider, MD  omeprazole (PRILOSEC) 40 MG capsule Take 40 mg by mouth daily.     Yes Historical Provider, MD  traMADol (ULTRAM) 50 MG tablet Take 100 mg by mouth every 6 (six) hours as needed (for pain.).   Yes Historical Provider, MD  valsartan-hydrochlorothiazide (DIOVAN-HCT) 320-25 MG per tablet Take 1 tablet by mouth at bedtime.   Yes Historical Provider, MD  vitamin E 400 UNIT capsule Take 400 Units by mouth daily.   Yes Historical Provider, MD  albuterol (PROVENTIL HFA;VENTOLIN HFA) 108 (90 BASE) MCG/ACT inhaler Inhale 2 puffs into the lungs every 6 (six) hours as needed for wheezing or shortness of breath. 09/28/13   Bonnielee Haff, MD  doxycycline (VIBRA-TABS) 100 MG tablet Take 1 tablet (100 mg total) by mouth 2 (two) times daily. 06/16/14   Garald Balding, NP  famotidine (PEPCID) 20 MG tablet Take 1 tablet (20 mg total) by mouth 2 (two) times daily. 06/16/14   Garald Balding, NP   BP 172/83  Pulse 61  Temp(Src) 98.2 F (36.8 C)  Resp 14  SpO2 97% Physical Exam  Constitutional: She appears well-developed and well-nourished.  HENT:  Head: Normocephalic.  Eyes: Pupils are equal, round, and reactive to light.  Neck: Normal range of motion.  Cardiovascular: Normal rate and regular rhythm.   Pulmonary/Chest: Effort  normal.  Abdominal: Soft.  Musculoskeletal: Normal range of motion. She exhibits tenderness. She exhibits no edema.       Legs: Skin: Skin is warm. No erythema.    ED Course  Procedures (including critical care time) Labs Review Labs Reviewed  CBC    Imaging Review Dg Ankle Complete Right  06/16/2014   CLINICAL  DATA:  Redness and swelling of the ankle.  EXAM: RIGHT ANKLE - COMPLETE 3+ VIEW  COMPARISON:  None.  FINDINGS: Diffuse soft tissue swelling. Degenerative changes in the ankle and intertarsal joints. No evidence of acute fracture or subluxation. No focal bone lesion or bone destruction. Bone cortex and trabecular architecture appear intact. No radiopaque soft tissue foreign bodies.  IMPRESSION: No acute bony abnormalities.  Soft tissue swelling.   Electronically Signed   By: Lucienne Capers M.D.   On: 06/16/2014 02:02     EKG Interpretation None      MDM   Final diagnoses:  Cellulitis of right lower extremity     [facility]bc normal xray no sigh of osteo will change antibiotic     Garald Balding, NP 06/16/14 [phone]

## 2014-06-16 NOTE — Discharge Instructions (Signed)
°Emergency Department Resource Guide °1) Find a Doctor and Pay Out of Pocket °Although you won't have to find out who is covered by your insurance plan, it is a good idea to ask around and get recommendations. You will then need to call the office and see if the doctor you have chosen will accept you as a new patient and what types of options they offer for patients who are self-pay. Some doctors offer discounts or will set up payment plans for their patients who do not have insurance, but you will need to ask so you aren't surprised when you get to your appointment. ° °2) Contact Your Local Health Department °Not all health departments have doctors that can see patients for sick visits, but many do, so it is worth a call to see if yours does. If you don't know where your local health department is, you can check in your phone book. The CDC also has a tool to help you locate your state's health department, and many state websites also have listings of all of their local health departments. ° °3) Find a Walk-in Clinic °If your illness is not likely to be very severe or complicated, you may want to try a walk in clinic. These are popping up all over the country in pharmacies, drugstores, and shopping centers. They're usually staffed by nurse practitioners or physician assistants that have been trained to treat common illnesses and complaints. They're usually fairly quick and inexpensive. However, if you have serious medical issues or chronic medical problems, these are probably not your best option. ° °No Primary Care Doctor: °- Call [facility] at  832-8000 - they can help you locate a primary care doctor that  accepts your insurance, provides certain services, etc. °- Physician Referral Service- 1-800-533-3463 ° °Chronic Pain Problems: °Organization         Address  Phone   Notes  °[facility]  (336) 297-2271 Patients need to be referred by their primary care doctor.  ° °Medication  Assistance: °Organization         Address  Phone   Notes  °[place] [facility] [address] °[place], [place] [address] (336) 641-8030 --Must be a resident of [place] °-- Must have NO insurance coverage whatsoever (no Medicaid/ Medicare, etc.) °-- The pt. MUST have a primary care doctor that directs their care regularly and follows them in the community °  °[facility]  (866) 331-1348   °[employer]  (888) 892-1162   ° °Agencies that provide inexpensive medical care: °Organization         Address  Phone   Notes  °[facility]  (336) 832-8035   °[facility]    (336) 832-7272   °[facility] [address] °[place], [place] [address] (336) 832-4777   °[facility] [address], °[place] (336) 271-4999   °[facility]    (336) 373-0678   °[facility]    (336) 272-1050   °[facility] ° [address], [place] Phone:  (336) 832-4444, Fax:  (336) 832-4440 Hours of Operation:  9 am - 6 pm, M-F.  Also accepts Medicaid/Medicare and self-pay.  °[facility] ° [address], [place] Phone: (336) 832-3150, Fax: (336) [phone]. Hours of Operation:  8:30 am - 5:30 pm, M-F.  Also accepts Medicaid and self-pay.  °[facility] [place] [address], [place] Phone: (336) 878-6027   °[facility] [address], [place], [place] (336)723-1848, Ext. 123 Mondays & Thursdays: 7-9 AM.  First 15 patients are seen on a first come, first serve basis. °  ° °Medicaid-accepting [place] Providers: ° °Organization         Address  Phone   Notes  °[facility] [address], [place] (336) 641-2100 Also accepts self-pay patients.  °[facility] [address], [place] ° (336) 856-9996   °[facility] [address], [place]  (336) 288-8857   °[facility] [address] [place] [address], [place] (336) 299-7000   °Veita Bland [address], [place]  ° (336) 373-1557 Only accepts [place] Access Medicaid patients after they have their name applied to their card.  ° °Self-Pay (no insurance) in [place]: ° °Organization         Address  Phone   Notes  °Sickle Cell Patients, [facility] [address], [place] (336) 832-1970   °[facility] [address], [place] (336) 832-4400   °[facility] ° [address], [place] (336) 992-4800   °[facility]/Dr. Osei-Bonsu ° [address] [place] [address], [place] or [address], [place] (336) 841-8500 Phone number for both [place] and [place] locations is the same.  °[facility] [address], [place] (336) 299-0000   °[facility] [address] [place] [address], [place] or [address] (336) 852-7530 °(336) 878-2260   °[facility] [address], [place] (336) 350-1642, phone; (336) 294-5005, fax Sees patients 1st and 3rd Saturday of every month.  Must not qualify for public or private insurance (i.e. Medicaid, Medicare, [place] Health Choice, Veterans' Benefits) • Household income should be no more than 200% of the poverty level •The clinic cannot treat you if you are pregnant or think you are pregnant • Sexually transmitted diseases are not treated at the clinic.  ° ° °Dental Care: °Organization         Address  Phone  Notes  °[place] [facility] [address], [place] (336) 641-6152 Accepts children up to age 21 who are enrolled in Medicaid or [place] Health Choice; pregnant women with a Medicaid card; and children who have applied for Medicaid or [place] Health Choice, but were declined, whose parents can pay a reduced fee at time of service.  °[place]  [facility] [place]  [address], [place] (336) 641-7733 Accepts children up to age 21 who are enrolled in Medicaid or [place] Health Choice; pregnant women with a Medicaid card; and children who have applied for Medicaid or [place] Health Choice, but were declined, whose parents can pay a reduced fee at time of service.  °[facility] ° [address],  (336) 641-4533 Patients are seen by appointment only. Walk-ins are not accepted. [facility] will see patients 18 years of age and older. °Monday - Tuesday (8am-5pm) °Most Wednesdays (8:30-5pm) °$30 per visit, cash only  °[facility] ° [address], [place] (336) 641-4533 Patients are seen by appointment only. Walk-ins are not accepted. [facility] will see patients 18 years of age and older. °One   Wednesday Evening (Monthly: Volunteer Based).  $30 per visit, cash only  °[facility]  (919) 537-3737 for adults; Children under age 4, call Graduate Pediatric Dentistry at ([phone]) [phone]. Children aged 4-14, please call (919) [phone] to request a pediatric application. ° Dental services are provided in all areas of dental care including fillings, crowns and bridges, complete and partial dentures, implants, gum treatment, root canals, and extractions. Preventive care is also provided. Treatment is provided to both adults and children. °Patients are selected via a lottery and there is often a waiting list. °  °[facility] [address], °[place] ° (336) 763-8833 [email or web address] °  °[facility] [address], [place], [place] (336)723-1848, Ext. 123 Second and Fourth Thursday of each month, opens at 6:30 AM; Clinic ends at 9 AM.  Patients are seen on a first-come first-served basis, and a limited number are seen during each clinic.  ° °[facility] ° [address], [place], [place] (336) 723-7904    Eligibility Requirements °You must have lived in [place], [place], or [place] counties for at least the last three months. °  You cannot be eligible for state or federal sponsored healthcare insurance, including Veterans Administration, Medicaid, or Medicare. °  You generally cannot be eligible for healthcare insurance through your employer.  °  How to apply: °Eligibility screenings are held every Tuesday and Wednesday afternoon from 1:00 pm until 4:00 pm. You do not need an appointment for the interview!  °[facility] [address], [place], [place] 336-631-2330   °[facility]  336-342-8273   °[facility]  336-703-3100   °[facility]  336-570-6415   ° °Behavioral Health Resources in the Community: °Intensive Outpatient Programs °Organization         Address  Phone  Notes  °[facility] [address], [place], [place] 336-878-6098   °[facility] [address], [place], [place] 336-832-9800   °[facility] [address], [place], [place] ° 336-882-2125   °[facility] [address],  °[place], [place] 1-800-853-5163 or 336-641-4981   °Substance Abuse Resources °Organization         Address  Phone  Notes  °[facility]  336-882-2125   °[facility]  336-784-9470   °[facility]  336-285-9073   °[facility]  336-845-3988   °Residential & Outpatient Substance Abuse Program  1-800-659-3381   °Psychological Services °Organization         Address  Phone  Notes  °[facility]  336- 832-9600   °[facility]  336- 378-7881   °[facility] [address], [place] 1-800-853-5163 or 336-641-4981   ° °Mobile Crisis Teams °Organization         Address  Phone  Notes  °[facility], Mobile Crisis Care Unit  1-877-626-1772   °Assertive °[facility] ° [address]  [place], [place] 336-834-9664   °Sharon DeEsch [address] °[place] 336-554-5454   ° °Self-Help/Support Groups °Organization         Address  Phone             Notes  °[facility] - variety of support groups  336- 373-1402 Call for more information  °Narcotics Anonymous (NA), [facility] [address], °[place]  2 meetings at this location  ° °  Residential Treatment Programs °Organization         Address  Phone  Notes  °[facility] [address],    °[place]  1-866-801-8205   °[facility] ° [address], [place], [place] 704-293-8524   °[facility] [address], [place] 336-845-3988 Admissions: 8am-3pm M-F  °[facility] [address]    °[place], [place] 336-841-1104   °[facility] [address], [place], [place] 336-379-7146   °[facility] [address]  °[place], [place] 336-285-9073   °[facility] - Intensive Outpatient [address], [place], [place] 336-852-3033   °[facility] [address]  °[place], [place] 1-877-615-2722 or 336-784-9470   °[facility] [address] [place], [place] 336-227-7417 Accepts Medicaid  °[facility] [address]  °[place] 1-800-659-3381 Substance Abuse/Addiction Treatment  ° °[place] Behavioral Health Resources °Organization         Address  Phone  Notes  °[facility]  (888) 581-9988   °Julie Brannon, PhD [address] [place], [place]   (336) 349-5553 or (336) 951-0000   °[facility]   [address] °[place], [place] (336) 349-4454   °[facility] [address], [place], [place] (336) 342-8316 Insurance/Medicaid/sponsorship through [facility]  °[facility] [address]                                    [place], [place] (336) 342-8316 Therapy/tele-psych/case    °[facility] [address]  ° [place], [place] (336) 349-2233    °Dr. Arfeen  (336) 349-4544   °[facility] [place]  [employer] [place] [facility] 1) [address], [place] °2) [address], [place] °3)  [address], [place] (336) 349-3220 °(336) 342-7768 ° °(336) 342-8140   °[place] [facility] (336) 342-1394 or (336) 342-3537 (After Hours)    ° ° °

## 2014-07-29 ENCOUNTER — Ambulatory Visit: Payer: Commercial Managed Care - HMO | Admitting: Internal Medicine

## 2014-09-17 ENCOUNTER — Other Ambulatory Visit: Payer: Self-pay | Admitting: Physical Medicine & Rehabilitation

## 2014-09-25 ENCOUNTER — Ambulatory Visit: Payer: PRIVATE HEALTH INSURANCE | Attending: Internal Medicine | Admitting: Internal Medicine

## 2014-09-25 ENCOUNTER — Encounter: Payer: Self-pay | Admitting: Internal Medicine

## 2014-09-25 ENCOUNTER — Ambulatory Visit (HOSPITAL_BASED_OUTPATIENT_CLINIC_OR_DEPARTMENT_OTHER): Payer: PRIVATE HEALTH INSURANCE | Admitting: *Deleted

## 2014-09-25 ENCOUNTER — Ambulatory Visit: Payer: Commercial Managed Care - HMO | Admitting: Internal Medicine

## 2014-09-25 VITALS — BP 170/90 | HR 65 | Temp 98.4°F | Resp 14 | Ht 63.0 in | Wt 135.0 lb

## 2014-09-25 DIAGNOSIS — M1710 Unilateral primary osteoarthritis, unspecified knee: Secondary | ICD-10-CM

## 2014-09-25 DIAGNOSIS — E669 Obesity, unspecified: Secondary | ICD-10-CM | POA: Insufficient documentation

## 2014-09-25 DIAGNOSIS — I714 Abdominal aortic aneurysm, without rupture: Secondary | ICD-10-CM | POA: Diagnosis not present

## 2014-09-25 DIAGNOSIS — M545 Low back pain: Secondary | ICD-10-CM | POA: Insufficient documentation

## 2014-09-25 DIAGNOSIS — E119 Type 2 diabetes mellitus without complications: Secondary | ICD-10-CM | POA: Diagnosis present

## 2014-09-25 DIAGNOSIS — E782 Mixed hyperlipidemia: Secondary | ICD-10-CM | POA: Insufficient documentation

## 2014-09-25 DIAGNOSIS — I2510 Atherosclerotic heart disease of native coronary artery without angina pectoris: Secondary | ICD-10-CM | POA: Diagnosis present

## 2014-09-25 DIAGNOSIS — M48061 Spinal stenosis, lumbar region without neurogenic claudication: Secondary | ICD-10-CM

## 2014-09-25 DIAGNOSIS — Z905 Acquired absence of kidney: Secondary | ICD-10-CM | POA: Diagnosis not present

## 2014-09-25 DIAGNOSIS — I10 Essential (primary) hypertension: Secondary | ICD-10-CM | POA: Diagnosis present

## 2014-09-25 DIAGNOSIS — K219 Gastro-esophageal reflux disease without esophagitis: Secondary | ICD-10-CM | POA: Insufficient documentation

## 2014-09-25 DIAGNOSIS — Z79899 Other long term (current) drug therapy: Secondary | ICD-10-CM | POA: Insufficient documentation

## 2014-09-25 DIAGNOSIS — Z85528 Personal history of other malignant neoplasm of kidney: Secondary | ICD-10-CM | POA: Insufficient documentation

## 2014-09-25 DIAGNOSIS — I252 Old myocardial infarction: Secondary | ICD-10-CM | POA: Insufficient documentation

## 2014-09-25 DIAGNOSIS — G8929 Other chronic pain: Secondary | ICD-10-CM | POA: Diagnosis not present

## 2014-09-25 DIAGNOSIS — F329 Major depressive disorder, single episode, unspecified: Secondary | ICD-10-CM | POA: Insufficient documentation

## 2014-09-25 DIAGNOSIS — Z23 Encounter for immunization: Secondary | ICD-10-CM | POA: Diagnosis not present

## 2014-09-25 DIAGNOSIS — M4806 Spinal stenosis, lumbar region: Secondary | ICD-10-CM

## 2014-09-25 DIAGNOSIS — E785 Hyperlipidemia, unspecified: Secondary | ICD-10-CM | POA: Insufficient documentation

## 2014-09-25 DIAGNOSIS — F17200 Nicotine dependence, unspecified, uncomplicated: Secondary | ICD-10-CM

## 2014-09-25 DIAGNOSIS — M170 Bilateral primary osteoarthritis of knee: Secondary | ICD-10-CM | POA: Diagnosis not present

## 2014-09-25 DIAGNOSIS — G894 Chronic pain syndrome: Secondary | ICD-10-CM | POA: Insufficient documentation

## 2014-09-25 DIAGNOSIS — Z7982 Long term (current) use of aspirin: Secondary | ICD-10-CM | POA: Insufficient documentation

## 2014-09-25 DIAGNOSIS — M810 Age-related osteoporosis without current pathological fracture: Secondary | ICD-10-CM | POA: Insufficient documentation

## 2014-09-25 DIAGNOSIS — F17210 Nicotine dependence, cigarettes, uncomplicated: Secondary | ICD-10-CM | POA: Insufficient documentation

## 2014-09-25 DIAGNOSIS — M179 Osteoarthritis of knee, unspecified: Secondary | ICD-10-CM

## 2014-09-25 DIAGNOSIS — IMO0002 Reserved for concepts with insufficient information to code with codable children: Secondary | ICD-10-CM

## 2014-09-25 DIAGNOSIS — Z720 Tobacco use: Secondary | ICD-10-CM

## 2014-09-25 LAB — COMPLETE METABOLIC PANEL WITH GFR
ALK PHOS: 56 U/L (ref 39–117)
ALT: <8 U/L (ref 0–35)
AST: 10 U/L (ref 0–37)
Albumin: 3.9 g/dL (ref 3.5–5.2)
BUN: 17 mg/dL (ref 6–23)
CALCIUM: 9.9 mg/dL (ref 8.4–10.5)
CO2: 30 mEq/L (ref 19–32)
CREATININE: 0.94 mg/dL (ref 0.50–1.10)
Chloride: 104 mEq/L (ref 96–112)
GFR, Est African American: 74 mL/min
GFR, Est Non African American: 64 mL/min
Glucose, Bld: 94 mg/dL (ref 70–99)
Potassium: 4.7 mEq/L (ref 3.5–5.3)
SODIUM: 140 meq/L (ref 135–145)
Total Bilirubin: 0.5 mg/dL (ref 0.2–1.2)
Total Protein: 7 g/dL (ref 6.0–8.3)

## 2014-09-25 LAB — CBC WITH DIFFERENTIAL/PLATELET
Basophils Absolute: 0 10*3/uL (ref 0.0–0.1)
Basophils Relative: 0 % (ref 0–1)
Eosinophils Absolute: 0.2 10*3/uL (ref 0.0–0.7)
Eosinophils Relative: 2 % (ref 0–5)
HEMATOCRIT: 37.3 % (ref 36.0–46.0)
Hemoglobin: 12.6 g/dL (ref 12.0–15.0)
LYMPHS PCT: 21 % (ref 12–46)
Lymphs Abs: 1.6 10*3/uL (ref 0.7–4.0)
MCH: 29.4 pg (ref 26.0–34.0)
MCHC: 33.8 g/dL (ref 30.0–36.0)
MCV: 86.9 fL (ref 78.0–100.0)
MPV: 11.8 fL (ref 9.4–12.4)
Monocytes Absolute: 0.8 10*3/uL (ref 0.1–1.0)
Monocytes Relative: 10 % (ref 3–12)
Neutro Abs: 5.2 10*3/uL (ref 1.7–7.7)
Neutrophils Relative %: 67 % (ref 43–77)
Platelets: 237 10*3/uL (ref 150–400)
RBC: 4.29 MIL/uL (ref 3.87–5.11)
RDW: 14 % (ref 11.5–15.5)
WBC: 7.7 10*3/uL (ref 4.0–10.5)

## 2014-09-25 LAB — LIPID PANEL
CHOLESTEROL: 144 mg/dL (ref 0–200)
HDL: 48 mg/dL (ref >39)
LDL Cholesterol: 82 mg/dL (ref 0–99)
Total CHOL/HDL Ratio: 3 Ratio
Triglycerides: 71 mg/dL (ref <150)
VLDL: 14 mg/dL (ref 0–40)

## 2014-09-25 LAB — GLUCOSE, POCT (MANUAL RESULT ENTRY): POC Glucose: 98 mg/dl (ref 70–99)

## 2014-09-25 LAB — POCT GLYCOSYLATED HEMOGLOBIN (HGB A1C): Hemoglobin A1C: 6.2

## 2014-09-25 MED ORDER — CLONIDINE HCL 0.1 MG PO TABS
0.1000 mg | ORAL_TABLET | Freq: Once | ORAL | Status: AC
  Administered 2014-09-25: 0.1 mg via ORAL

## 2014-09-25 MED ORDER — OMEPRAZOLE 40 MG PO CPDR: 40.0000 mg | DELAYED_RELEASE_CAPSULE | Freq: Every day | ORAL | Status: DC

## 2014-09-25 MED ORDER — BUPROPION HCL 75 MG PO TABS: 75.0000 mg | ORAL_TABLET | Freq: Two times a day (BID) | ORAL | Status: DC

## 2014-09-25 MED ORDER — VALSARTAN-HYDROCHLOROTHIAZIDE 320-25 MG PO TABS: 1.0000 | ORAL_TABLET | Freq: Every day | ORAL | Status: DC

## 2014-09-25 NOTE — Progress Notes (Signed)
Patient Demographics  Theresa Cline, is a 63 y.o. female  IRJ:[id number]  TKZ:[id number]  DOB - [birth year]  CC:  Chief Complaint  Patient presents with  . Establish Care       HPI: Theresa Cline is a 63 y.o. female here today to establish medical care.Patient has history of diabetes, hypertension, osteoporosis, CAD status post MI with stents, AAA following up with cardiology, chronic lower back pain spine stenosis, left knee osteoarthritis, patient has been following up with physical medicine and orthopedics, today her blood pressure is elevated her, as per patient she has not taken her blood pressure medications today, denies any acute symptoms denies any headache dizziness chest and shortness of breath, she was given clonidine and her repeat manual blood pressure is improved, for diabetes she has been taking metformin, today her hemoglobin A1c is 6.2%, she denies any hypoglycemic symptoms, for blood pressure is she's  on valsartan/hydrochlorothiazide, metoprolol. Patient will like to get a flu shot today. As per patient she is up-to-date with colonoscopy.patient still smokes cigarettes, I have advised patient to quit smoking, since she has history of depression in the past she could not used Chantix but she would like to try Wellbutrin. Patient has No headache, No chest pain, No abdominal pain - No Nausea, No new weakness tingling or numbness, No Cough - SOB.  Allergies  Allergen Reactions  . Gabapentin Swelling  . Lyrica [Pregabalin] Swelling  . Adhesive [Tape] Rash   Past Medical History  Diagnosis Date  . S/P carpal tunnel release   . Vertigo   . Tobacco abuse   . Encounter for long-term (current) use of other medications   . Osteoporosis   . Left acoustic neuroma     Hx of  . Diabetic peripheral neuropathy   . Mixed hyperlipidemia   . Primary osteoarthritis of both knees   . Chronic pain syndrome   . Obesity   . Hypertension   . CAD (coronary artery disease)    . Diabetes mellitus type II, controlled   . Bronchitis, acute   . Diabetes type 2, controlled   . Cancer     Renal s/p nephrectomy   Current Outpatient Prescriptions on File Prior to Visit  Medication Sig Dispense Refill  . alendronate (FOSAMAX) 70 MG tablet Take 70 mg by mouth once a week. Take with a full glass of water on an empty stomach.    Marland Kitchen aspirin EC 81 MG tablet Take 81 mg by mouth daily.    Marland Kitchen atorvastatin (LIPITOR) 40 MG tablet Take 40 mg by mouth daily.     . Cholecalciferol (VITAMIN D) 2000 UNITS CAPS Take 2,000 Units by mouth daily.    . diclofenac sodium (VOLTAREN) 1 % GEL Apply 2 g topically 4 (four) times daily as needed (for pain).     . DULoxetine (CYMBALTA) 30 MG capsule Take 30 mg by mouth daily.    . DULoxetine (CYMBALTA) 30 MG capsule TAKE 1 CAPSULE (30 MG TOTAL) BY MOUTH DAILY. 30 capsule 3  . famotidine (PEPCID) 20 MG tablet Take 1 tablet (20 mg total) by mouth 2 (two) times daily. 20 tablet 0  . fish oil-omega-3 fatty acids 1000 MG capsule Take 1 g by mouth daily.      . fluticasone (FLONASE) 50 MCG/ACT nasal spray Place 1 spray into both nostrils daily as needed for allergies or rhinitis.    . metFORMIN (GLUCOPHAGE) 1000 MG tablet Take 1,000 mg by mouth 2 (two) times daily with  a meal.      . metoprolol (TOPROL-XL) 100 MG 24 hr tablet Take 100 mg by mouth daily.      . Multiple Vitamin (MULTIVITAMIN WITH MINERALS) TABS tablet Take 1 tablet by mouth daily. Centrum 50 +    . nitroGLYCERIN (NITROLINGUAL) 0.4 MG/SPRAY spray Place 1 spray under the tongue every 5 (five) minutes x 3 doses as needed for chest pain.    . traMADol (ULTRAM) 50 MG tablet Take 100 mg by mouth every 6 (six) hours as needed (for pain.).    Marland Kitchen vitamin E 400 UNIT capsule Take 400 Units by mouth daily.    Marland Kitchen albuterol (PROVENTIL HFA;VENTOLIN HFA) 108 (90 BASE) MCG/ACT inhaler Inhale 2 puffs into the lungs every 6 (six) hours as needed for wheezing or shortness of breath. (Patient not taking: Reported  on 09/25/2014) 1 Inhaler 2  . cephALEXin (KEFLEX) 500 MG capsule Take 500 mg by mouth 3 (three) times daily. 10 day therapy course patient began on 06/05/2014    . clonazePAM (KLONOPIN) 0.5 MG tablet Take 0.5 mg by mouth at bedtime.    Marland Kitchen doxycycline (VIBRA-TABS) 100 MG tablet Take 1 tablet (100 mg total) by mouth 2 (two) times daily. (Patient not taking: Reported on 09/25/2014) 28 tablet 0   No current facility-administered medications on file prior to visit.   Family History  Problem Relation Age of Onset  . Leukemia Mother     CLL  . Diabetes type II Mother   . Other Mother     Hypercholesterolemia  . Osteoarthritis Mother   . COPD Mother   . Hypertension Mother   . Hodgkin's lymphoma Father   . Diabetes type II Sister   . Osteoporosis Sister   . Paranoid behavior Brother     Unsure if diagnosed with Schizophrenia  . Diabetes type II Sister   . Hypertension Sister   . Diabetes type II Sister   . Hypertension Sister   . Schizophrenia Sister   . Heart disease Sister     Enlarged heart  . Glaucoma Sister   . Alcohol abuse Sister   . Glaucoma Sister   . Alcohol abuse Sister   . Bipolar disorder Daughter   . Lupus Daughter     SLE   History   Social History  . Marital Status: Divorced    Spouse Name: N/A    Number of Children: N/A  . Years of Education: N/A   Occupational History  . 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 Smoking status: Current Every Day Smoker -- 0.50 packs/day for 40 years    Types: Cigarettes  . Smokeless tobacco: Never Used  . Alcohol Use: No  . Drug Use: No  . Sexual Activity: Not on file   Other Topics Concern  . Not on file   Social History Narrative   Divorced   Disability mainly for back and chronic intermittent vertigo - since tumor removed - no surgical correction for back   Lives alone, keeps her grandson, 34 yr old daughter is a mother    Review of Systems: Constitutional:  Negative for fever, chills, diaphoresis, activity change, appetite change and fatigue. HENT: Negative for ear pain, nosebleeds, congestion, facial swelling, rhinorrhea, neck pain, neck stiffness and ear discharge.  Eyes: Negative for pain, discharge, redness, itching and visual disturbance. Respiratory: Negative for cough, choking, chest tightness, shortness of breath, wheezing and  stridor.  Cardiovascular: Negative for chest pain, palpitations and leg swelling. Gastrointestinal: Negative for abdominal distention. Genitourinary: Negative for dysuria, urgency, frequency, hematuria, flank pain, decreased urine volume, difficulty urinating and dyspareunia.  Musculoskeletal: Negative for back pain, joint swelling, arthralgia and gait problem. Neurological: Negative for dizziness, tremors, seizures, syncope, facial asymmetry, speech difficulty, weakness, light-headedness, numbness and headaches.  Hematological: Negative for adenopathy. Does not bruise/bleed easily. Psychiatric/Behavioral: Negative for hallucinations, behavioral problems, confusion, dysphoric mood, decreased concentration and agitation.    Objective:   Filed Vitals:   09/25/14 1048  BP: 170/90  Pulse:   Temp:   Resp:     Physical Exam: Constitutional: Patient appears well-developed and well-nourished. No distress. HENT: Normocephalic, atraumatic, External right and left ear normal. Oropharynx is clear and moist.  Eyes: Conjunctivae and EOM are normal. PERRLA, no scleral icterus. Neck: Normal ROM. Neck supple. No JVD. No tracheal deviation. No thyromegaly. CVS: RRR, S1/S2 +, no murmurs, no gallops, no carotid bruit.  Pulmonary: Effort and breath sounds normal, no stridor, rhonchi, wheezes, rales.  Abdominal: Soft. BS +, no distension, tenderness, rebound or guarding.  Musculoskeletal: Normal range of motion. No edema and no tenderness.  Neuro: Alert. Normal reflexes, muscle tone coordination. No cranial nerve deficit. Skin:  Skin is warm and dry. No rash noted. Not diaphoretic. No erythema. No pallor. Psychiatric: Normal mood and affect. Behavior, judgment, thought content normal.  Lab Results  Component Value Date   WBC 7.0 06/16/2014   HGB 12.0 06/16/2014   HCT 36.6 06/16/2014   MCV 86.3 06/16/2014   PLT 208 06/16/2014   Lab Results  Component Value Date   CREATININE 0.89 09/28/2013   BUN 12 09/28/2013   NA 137 09/28/2013   K 3.4* 09/28/2013   CL 102 09/28/2013   CO2 24 09/28/2013    Lab Results  Component Value Date   HGBA1C 6.2 09/25/2014   Lipid Panel     Component Value Date/Time   CHOL 127 02/01/2009 2152   TRIG 107 02/01/2009 2152   HDL 42 02/01/2009 2152   CHOLHDL 3.0 Ratio 02/01/2009 2152   VLDL 21 02/01/2009 2152   LDLCALC 64 02/01/2009 2152       Assessment and plan:   1. Type 2 diabetes mellitus without complication Results for orders placed or performed in visit on 09/25/14  Glucose (CBG)  Result Value Ref Range   POC Glucose 98 70 - 99 mg/dl  HgB A1c  Result Value Ref Range   Hemoglobin A1C 6.2   Diabetes is well controlled, continue with her metformin, keep the fingerstick log - Lipid panel  2. Essential hypertension Advised patient for DASH diet, since patient has not taken the medication today she will continue taking the medications for 2 weeks will come back for the last visit.  - cloNIDine (CATAPRES) tablet 0.1 mg; Take 1 tablet (0.1 mg total) by mouth once. - COMPLETE METABOLIC PANEL WITH GFR - Vit D  25 hydroxy (rtn osteoporosis monitoring) - CBC with Differential - valsartan-hydrochlorothiazide (DIOVAN-HCT) 320-25 MG per tablet; Take 1 tablet by mouth at bedtime.  Dispense: 30 tablet; Refill: 3  3. Osteoarthrosis, unspecified whether generalized or localized, involving lower leg [place] pain medication following up with orthopedics  4. Spinal stenosis, lumbar region, without neurogenic claudication Patient currently following up with physical  medicine.  5. Smoking Trial of Wellbutrin. - buPROPion (WELLBUTRIN) 75 MG tablet; Take 1 tablet (75 mg total) by mouth 2 (two) times daily.  Dispense: 60 tablet; Refill: 3  6. Needs flu shot Flu shot given today  7. Gastroesophageal reflux disease, esophagitis presence not specified Lifestyle modification, continue with - omeprazole (PRILOSEC) 40 MG capsule; Take 1 capsule (40 mg total) by mouth daily.  Dispense: 90 capsule; Refill: 1       Health Maintenance -Colonoscopy: uptodate   -Vaccinations:  Flu shot today   Return in about 3 months (around 12/25/2014) for diabetes, hypertension, BP check in 2 weeks/Nurse Visit.    Lorayne Marek, MD

## 2014-09-25 NOTE — Patient Instructions (Signed)
Smoking Cessation Quitting smoking is important to your health and has many advantages. However, it is not always easy to quit since nicotine is a very addictive drug. Oftentimes, people try 3 times or more before being able to quit. This document explains the best ways for you to prepare to quit smoking. Quitting takes hard work and a lot of effort, but you can do it. ADVANTAGES OF QUITTING SMOKING  You will live longer, feel better, and live better.  Your body will feel the impact of quitting smoking almost immediately.  Within 20 minutes, blood pressure decreases. Your pulse returns to its normal level.  After 8 hours, carbon monoxide levels in the blood return to normal. Your oxygen level increases.  After 24 hours, the chance of having a heart attack starts to decrease. Your breath, hair, and body stop smelling like smoke.  After 48 hours, damaged nerve endings begin to recover. Your sense of taste and smell improve.  After 72 hours, the body is virtually free of nicotine. Your bronchial tubes relax and breathing becomes easier.  After 2 to 12 weeks, lungs can hold more air. Exercise becomes easier and circulation improves.  The risk of having a heart attack, stroke, cancer, or lung disease is greatly reduced.  After 1 year, the risk of coronary heart disease is cut in half.  After 5 years, the risk of stroke falls to the same as a nonsmoker.  After 10 years, the risk of lung cancer is cut in half and the risk of other cancers decreases significantly.  After 15 years, the risk of coronary heart disease drops, usually to the level of a nonsmoker.  If you are pregnant, quitting smoking will improve your chances of having a healthy baby.  The people you live with, especially any children, will be healthier.  You will have extra money to spend on things other than cigarettes. QUESTIONS TO THINK ABOUT BEFORE ATTEMPTING TO QUIT You may want to talk about your answers with your  health care provider.  Why do you want to quit?  If you tried to quit in the past, what helped and what did not?  What will be the most difficult situations for you after you quit? How will you plan to handle them?  Who can help you through the tough times? Your family? Friends? A health care provider?  What pleasures do you get from smoking? What ways can you still get pleasure if you quit? Here are some questions to ask your health care provider:  How can you help me to be successful at quitting?  What medicine do you think would be best for me and how should I take it?  What should I do if I need more help?  What is smoking withdrawal like? How can I get information on withdrawal? GET READY  Set a quit date.  Change your environment by getting rid of all cigarettes, ashtrays, matches, and lighters in your home, car, or work. Do not let people smoke in your home.  Review your past attempts to quit. Think about what worked and what did not. GET SUPPORT AND ENCOURAGEMENT You have a better chance of being successful if you have help. You can get support in many ways.  Tell your family, friends, and coworkers that you are going to quit and need their support. Ask them not to smoke around you.  Get individual, group, or telephone counseling and support. Programs are available at local hospitals and health centers. Call   your local health department for information about programs in your area.  Spiritual beliefs and practices may help some smokers quit.  Download a "quit meter" on your computer to keep track of quit statistics, such as how long you have gone without smoking, cigarettes not smoked, and money saved.  Get a self-help book about quitting smoking and staying off tobacco. LEARN NEW SKILLS AND BEHAVIORS  Distract yourself from urges to smoke. Talk to someone, go for a walk, or occupy your time with a task.  Change your normal routine. Take a different route to work.  Drink tea instead of coffee. Eat breakfast in a different place.  Reduce your stress. Take a hot bath, exercise, or read a book.  Plan something enjoyable to do every day. Reward yourself for not smoking.  Explore interactive web-based programs that specialize in helping you quit. GET MEDICINE AND USE IT CORRECTLY Medicines can help you stop smoking and decrease the urge to smoke. Combining medicine with the above behavioral methods and support can greatly increase your chances of successfully quitting smoking.  Nicotine replacement therapy helps deliver nicotine to your body without the negative effects and risks of smoking. Nicotine replacement therapy includes nicotine gum, lozenges, inhalers, nasal sprays, and skin patches. Some may be available over-the-counter and others require a prescription.  Antidepressant medicine helps people abstain from smoking, but how this works is unknown. This medicine is available by prescription.  Nicotinic receptor partial agonist medicine simulates the effect of nicotine in your brain. This medicine is available by prescription. Ask your health care provider for advice about which medicines to use and how to use them based on your health history. Your health care provider will tell you what side effects to look out for if you choose to be on a medicine or therapy. Carefully read the information on the package. Do not use any other product containing nicotine while using a nicotine replacement product.  RELAPSE OR DIFFICULT SITUATIONS Most relapses occur within the first 3 months after quitting. Do not be discouraged if you start smoking again. Remember, most people try several times before finally quitting. You may have symptoms of withdrawal because your body is used to nicotine. You may crave cigarettes, be irritable, feel very hungry, cough often, get headaches, or have difficulty concentrating. The withdrawal symptoms are only temporary. They are strongest  when you first quit, but they will go away within 10-14 days. To reduce the chances of relapse, try to:  Avoid drinking alcohol. Drinking lowers your chances of successfully quitting.  Reduce the amount of caffeine you consume. Once you quit smoking, the amount of caffeine in your body increases and can give you symptoms, such as a rapid heartbeat, sweating, and anxiety.  Avoid smokers because they can make you want to smoke.  Do not let weight gain distract you. Many smokers will gain weight when they quit, usually less than 10 pounds. Eat a healthy diet and stay active. You can always lose the weight gained after you quit.  Find ways to improve your mood other than smoking. FOR MORE INFORMATION  www.smokefree.gov  Document Released: 09/19/2001 Document Revised: 02/09/2014 Document Reviewed: 01/04/2012 [employer] Patient Information 2015 [employer]. This information is not intended to replace advice given to you by your health care provider. Make sure you discuss any questions you have with your health care provider. DASH Eating Plan DASH stands for "Dietary Approaches to Stop Hypertension." The DASH eating plan is a healthy eating plan that has   been shown to reduce high blood pressure (hypertension). Additional health benefits may include reducing the risk of type 2 diabetes mellitus, heart disease, and stroke. The DASH eating plan may also help with weight loss. WHAT DO I NEED TO KNOW ABOUT THE DASH EATING PLAN? For the DASH eating plan, you will follow these general guidelines:  Choose foods with a percent daily value for sodium of less than 5% (as listed on the food label).  Use salt-free seasonings or herbs instead of table salt or sea salt.  Check with your health care provider or pharmacist before using salt substitutes.  Eat lower-sodium products, often labeled as "lower sodium" or "no salt added."  Eat fresh foods.  Eat more vegetables, fruits, and low-fat dairy  products.  Choose whole grains. Look for the word "whole" as the first word in the ingredient list.  Choose fish and skinless chicken or turkey more often than red meat. Limit fish, poultry, and meat to 6 oz (170 g) each day.  Limit sweets, desserts, sugars, and sugary drinks.  Choose heart-healthy fats.  Limit cheese to 1 oz (28 g) per day.  Eat more home-cooked food and less restaurant, buffet, and fast food.  Limit fried foods.  Cook foods using methods other than frying.  Limit canned vegetables. If you do use them, rinse them well to decrease the sodium.  When eating at a restaurant, ask that your food be prepared with less salt, or no salt if possible. WHAT FOODS CAN I EAT? Seek help from a dietitian for individual calorie needs. Grains Whole grain or whole wheat bread. Brown rice. Whole grain or whole wheat pasta. Quinoa, bulgur, and whole grain cereals. Low-sodium cereals. Corn or whole wheat flour tortillas. Whole grain cornbread. Whole grain crackers. Low-sodium crackers. Vegetables Fresh or frozen vegetables (raw, steamed, roasted, or grilled). Low-sodium or reduced-sodium tomato and vegetable juices. Low-sodium or reduced-sodium tomato sauce and paste. Low-sodium or reduced-sodium canned vegetables.  Fruits All fresh, canned (in natural juice), or frozen fruits. Meat and Other Protein Products Ground beef (85% or leaner), grass-fed beef, or beef trimmed of fat. Skinless chicken or turkey. Ground chicken or turkey. Pork trimmed of fat. All fish and seafood. Eggs. Dried beans, peas, or lentils. Unsalted nuts and seeds. Unsalted canned beans. Dairy Low-fat dairy products, such as skim or 1% milk, 2% or reduced-fat cheeses, low-fat ricotta or cottage cheese, or plain low-fat yogurt. Low-sodium or reduced-sodium cheeses. Fats and Oils Tub margarines without trans fats. Light or reduced-fat mayonnaise and salad dressings (reduced sodium). Avocado. Safflower, olive, or canola  oils. Natural peanut or almond butter. Other Unsalted popcorn and pretzels. The items listed above may not be a complete list of recommended foods or beverages. Contact your dietitian for more options. WHAT FOODS ARE NOT RECOMMENDED? Grains White bread. White pasta. White rice. Refined cornbread. Bagels and croissants. Crackers that contain trans fat. Vegetables Creamed or fried vegetables. Vegetables in a cheese sauce. Regular canned vegetables. Regular canned tomato sauce and paste. Regular tomato and vegetable juices. Fruits Dried fruits. Canned fruit in light or heavy syrup. Fruit juice. Meat and Other Protein Products Fatty cuts of meat. Ribs, chicken wings, bacon, sausage, bologna, salami, chitterlings, fatback, hot dogs, bratwurst, and packaged luncheon meats. Salted nuts and seeds. Canned beans with salt. Dairy Whole or 2% milk, cream, half-and-half, and cream cheese. Whole-fat or sweetened yogurt. Full-fat cheeses or blue cheese. Nondairy creamers and whipped toppings. Processed cheese, cheese spreads, or cheese curds. Condiments Onion and garlic salt,   seasoned salt, table salt, and sea salt. Canned and packaged gravies. Worcestershire sauce. Tartar sauce. Barbecue sauce. Teriyaki sauce. Soy sauce, including reduced sodium. Steak sauce. Fish sauce. Oyster sauce. Cocktail sauce. Horseradish. Ketchup and mustard. Meat flavorings and tenderizers. Bouillon cubes. Hot sauce. Tabasco sauce. Marinades. Taco seasonings. Relishes. Fats and Oils Butter, stick margarine, lard, shortening, ghee, and bacon fat. Coconut, palm kernel, or palm oils. Regular salad dressings. Other Pickles and olives. Salted popcorn and pretzels. The items listed above may not be a complete list of foods and beverages to avoid. Contact your dietitian for more information. WHERE CAN I FIND MORE INFORMATION? National [employer], [employer], [employer]: www.nhlbi.nih.gov/health/health-topics/topics/dash/ Document Released:  09/14/2011 Document Revised: 02/09/2014 Document Reviewed: 07/30/2013 [employer] Patient Information 2015 [employer]. This information is not intended to replace advice given to you by your health care provider. Make sure you discuss any questions you have with your health care provider.  

## 2014-09-25 NOTE — Progress Notes (Signed)
Pt is here to establish care. Pt has a history of HTN, diabetes, and hyperlipidemia. Pt needs to refill her medications. Pt states that for 25 years she has been dealing with severe lower back pain.

## 2014-09-26 LAB — VITAMIN D 25 HYDROXY (VIT D DEFICIENCY, FRACTURES): VIT D 25 HYDROXY: 53 ng/mL (ref 30–100)

## 2014-09-29 ENCOUNTER — Telehealth: Payer: Self-pay | Admitting: Emergency Medicine

## 2014-09-29 NOTE — Telephone Encounter (Signed)
Pt given normal blood work

## 2014-09-29 NOTE — Telephone Encounter (Signed)
-----   Message from Lorayne Marek, MD sent at 09/28/2014 10:54 AM EST ----- Call and let the Patient know that blood work is normal.

## 2014-10-12 ENCOUNTER — Ambulatory Visit: Payer: Medicare Other | Attending: Internal Medicine | Admitting: *Deleted

## 2014-10-12 VITALS — BP 157/76 | HR 62 | Temp 97.9°F | Resp 16

## 2014-10-12 DIAGNOSIS — I10 Essential (primary) hypertension: Secondary | ICD-10-CM | POA: Diagnosis not present

## 2014-10-12 DIAGNOSIS — E785 Hyperlipidemia, unspecified: Secondary | ICD-10-CM | POA: Insufficient documentation

## 2014-10-12 DIAGNOSIS — Z720 Tobacco use: Secondary | ICD-10-CM | POA: Insufficient documentation

## 2014-10-12 DIAGNOSIS — E119 Type 2 diabetes mellitus without complications: Secondary | ICD-10-CM | POA: Insufficient documentation

## 2014-10-12 MED ORDER — ATORVASTATIN CALCIUM 40 MG PO TABS: 40.0000 mg | ORAL_TABLET | Freq: Every day | ORAL | Status: DC

## 2014-10-12 MED ORDER — ALBUTEROL SULFATE HFA 108 (90 BASE) MCG/ACT IN AERS: 2.0000 | INHALATION_SPRAY | Freq: Four times a day (QID) | RESPIRATORY_TRACT | Status: DC | PRN

## 2014-10-12 MED ORDER — NITROGLYCERIN 0.4 MG/SPRAY TL SOLN: 1.0000 | Status: DC | PRN

## 2014-10-12 NOTE — Progress Notes (Signed)
Patient presents for BP check Med list reviewed; states taking all meds as directed States she is following low sodium diet and attempting to quit smoking Reports smoking 4-5 cigs/day at present  BP 157/76 P 62 R 16  T 97.9 oral SPO2  98%  Per PCP: Patient is to continue meds as is and return for 3 month f/u  Patient advised to call for med refills at least 7 days before running out so as not to go without. Patient aware that she is to f/u with PCP 3 months from last visit (Due 12/25/14)

## 2014-10-19 ENCOUNTER — Telehealth: Payer: Self-pay | Admitting: Internal Medicine

## 2014-10-19 NOTE — Telephone Encounter (Signed)
Patient called stating that he blood pressure was 172/105 this morning and she has not been feeling well. Patient would like to speak to nurse, please f/u with pt.

## 2014-10-19 NOTE — Telephone Encounter (Signed)
Left message on patient's VM to return call.

## 2014-10-20 ENCOUNTER — Emergency Department (HOSPITAL_COMMUNITY)
Admission: EM | Admit: 2014-10-20 | Discharge: 2014-10-20 | Disposition: A | Discharge status: Home / Self Care | Payer: Medicare Other | Attending: Emergency Medicine | Admitting: Emergency Medicine

## 2014-10-20 ENCOUNTER — Encounter (HOSPITAL_COMMUNITY): Payer: Self-pay | Admitting: Emergency Medicine

## 2014-10-20 ENCOUNTER — Emergency Department (HOSPITAL_COMMUNITY): Payer: Medicare Other

## 2014-10-20 DIAGNOSIS — E1140 Type 2 diabetes mellitus with diabetic neuropathy, unspecified: Secondary | ICD-10-CM | POA: Diagnosis not present

## 2014-10-20 DIAGNOSIS — R51 Headache: Secondary | ICD-10-CM | POA: Diagnosis not present

## 2014-10-20 DIAGNOSIS — E782 Mixed hyperlipidemia: Secondary | ICD-10-CM | POA: Diagnosis not present

## 2014-10-20 DIAGNOSIS — Z7982 Long term (current) use of aspirin: Secondary | ICD-10-CM | POA: Insufficient documentation

## 2014-10-20 DIAGNOSIS — I2510 Atherosclerotic heart disease of native coronary artery without angina pectoris: Secondary | ICD-10-CM | POA: Insufficient documentation

## 2014-10-20 DIAGNOSIS — I10 Essential (primary) hypertension: Secondary | ICD-10-CM | POA: Insufficient documentation

## 2014-10-20 DIAGNOSIS — E669 Obesity, unspecified: Secondary | ICD-10-CM | POA: Insufficient documentation

## 2014-10-20 DIAGNOSIS — Z862 Personal history of diseases of the blood and blood-forming organs and certain disorders involving the immune mechanism: Secondary | ICD-10-CM | POA: Insufficient documentation

## 2014-10-20 DIAGNOSIS — M179 Osteoarthritis of knee, unspecified: Secondary | ICD-10-CM | POA: Insufficient documentation

## 2014-10-20 DIAGNOSIS — Z720 Tobacco use: Secondary | ICD-10-CM | POA: Insufficient documentation

## 2014-10-20 DIAGNOSIS — Z79899 Other long term (current) drug therapy: Secondary | ICD-10-CM | POA: Diagnosis not present

## 2014-10-20 DIAGNOSIS — M810 Age-related osteoporosis without current pathological fracture: Secondary | ICD-10-CM | POA: Diagnosis not present

## 2014-10-20 DIAGNOSIS — Z9889 Other specified postprocedural states: Secondary | ICD-10-CM | POA: Diagnosis not present

## 2014-10-20 DIAGNOSIS — G894 Chronic pain syndrome: Secondary | ICD-10-CM | POA: Diagnosis not present

## 2014-10-20 DIAGNOSIS — R030 Elevated blood-pressure reading, without diagnosis of hypertension: Secondary | ICD-10-CM | POA: Diagnosis not present

## 2014-10-20 DIAGNOSIS — R519 Headache, unspecified: Secondary | ICD-10-CM

## 2014-10-20 DIAGNOSIS — I6782 Cerebral ischemia: Secondary | ICD-10-CM | POA: Diagnosis not present

## 2014-10-20 DIAGNOSIS — Z85528 Personal history of other malignant neoplasm of kidney: Secondary | ICD-10-CM | POA: Diagnosis not present

## 2014-10-20 LAB — I-STAT CHEM 8, ED
BUN: 13 mg/dL (ref 6–23)
CREATININE: 1 mg/dL (ref 0.50–1.10)
Calcium, Ion: 1.13 mmol/L (ref 1.13–1.30)
Chloride: 98 mEq/L (ref 96–112)
GLUCOSE: 93 mg/dL (ref 70–99)
HEMATOCRIT: 43 % (ref 36.0–46.0)
HEMOGLOBIN: 14.6 g/dL (ref 12.0–15.0)
POTASSIUM: 3.7 mmol/L (ref 3.5–5.1)
SODIUM: 137 mmol/L (ref 135–145)
TCO2: 24 mmol/L (ref 0–100)

## 2014-10-20 LAB — I-STAT TROPONIN, ED: TROPONIN I, POC: 0 ng/mL (ref 0.00–0.08)

## 2014-10-20 MED ORDER — HYDRALAZINE HCL 20 MG/ML IJ SOLN
5.0000 mg | Freq: Once | INTRAMUSCULAR | Status: AC
  Administered 2014-10-20: 5 mg via INTRAVENOUS
  Filled 2014-10-20: qty 1

## 2014-10-20 MED ORDER — SODIUM CHLORIDE 0.9 % IV BOLUS (SEPSIS)
500.0000 mL | Freq: Once | INTRAVENOUS | Status: AC
  Administered 2014-10-20: 500 mL via INTRAVENOUS

## 2014-10-20 MED ORDER — ACETAMINOPHEN 325 MG PO TABS
650.0000 mg | ORAL_TABLET | Freq: Once | ORAL | Status: AC
  Administered 2014-10-20: 650 mg via ORAL
  Filled 2014-10-20: qty 2

## 2014-10-20 NOTE — ED Provider Notes (Signed)
CSN: [id number]     Arrival date & time 10/20/14  1835 History   First MD Initiated Contact with Patient 10/20/14 1905     Chief Complaint  Patient presents with  . Hypertension     (Consider location/radiation/quality/duration/timing/severity/associated sxs/prior Treatment) Patient is a 64 y.o. female presenting with hypertension. The history is provided by the patient.  Hypertension This is a chronic problem. The current episode started more than 1 week ago. The problem occurs constantly. The problem has not changed since onset.Associated symptoms include headaches. Pertinent negatives include no chest pain, no abdominal pain and no shortness of breath. Nothing aggravates the symptoms. Nothing relieves the symptoms. Treatments tried: normal BP meds. The treatment provided no relief.    Past Medical History  Diagnosis Date  . S/P carpal tunnel release   . Vertigo   . Tobacco abuse   . Encounter for long-term (current) use of other medications   . Osteoporosis   . Left acoustic neuroma     Hx of  . Diabetic peripheral neuropathy   . Mixed hyperlipidemia   . Primary osteoarthritis of both knees   . Chronic pain syndrome   . Obesity   . Hypertension   . CAD (coronary artery disease)   . Diabetes mellitus type II, controlled   . Bronchitis, acute   . Diabetes type 2, controlled   . Cancer     Renal s/p nephrectomy   Past Surgical History  Procedure Laterality Date  . Resection of left acoustic neuroma  1999  . Coronary angioplasty with stent placement  2004    Normal LV function  . Total abdominal hysterectomy w/ bilateral salpingoophorectomy  04/12/1988    For fibroid tumors; [place], [place]  . Carpal tunnel release  08/09/1998    Right  . Carpal tunnel release  09/08/1998    Left  . Removal lipoma      Right foot  . Cesarean section      x3   Family History  Problem Relation Age of Onset  . Leukemia Mother     CLL  . Diabetes type II Mother   . Other Mother     Hypercholesterolemia  . Osteoarthritis Mother   . COPD Mother   . Hypertension Mother   . Hodgkin's lymphoma Father   . Diabetes type II Sister   . Osteoporosis Sister   . Paranoid behavior Brother     Unsure if diagnosed with Schizophrenia  . Diabetes type II Sister   . Hypertension Sister   . Diabetes type II Sister   . Hypertension Sister   . Schizophrenia Sister   . Heart disease Sister     Enlarged heart  . Glaucoma Sister   . Alcohol abuse Sister   . Glaucoma Sister   . Alcohol abuse Sister   . Bipolar disorder Daughter   . Lupus Daughter     SLE   History  Substance Use Topics  . Smoking status: Current Every Day Smoker -- 0.50 packs/day for 40 years    Types: Cigarettes  . Smokeless tobacco: Never Used     Comment: smoking 4-5 cigs/day  . Alcohol Use: No   OB History    No data available     Review of Systems  Constitutional: Negative for fever and fatigue.  HENT: Negative for congestion and drooling.   Eyes: Negative for pain.  Respiratory: Negative for cough and shortness of breath.   Cardiovascular: Negative for chest pain.  Gastrointestinal: Negative for nausea,  vomiting, abdominal pain and diarrhea.  Genitourinary: Negative for dysuria and hematuria.  Musculoskeletal: Negative for back pain, gait problem and neck pain.  Skin: Negative for color change.  Neurological: Positive for headaches. Negative for dizziness.  Hematological: Negative for adenopathy.  Psychiatric/Behavioral: Negative for behavioral problems.  All other systems reviewed and are negative.     Allergies  Gabapentin; Lyrica; and Adhesive  Home Medications   Prior to Admission medications   Medication Sig Start Date End Date Taking? Authorizing Provider  albuterol (PROVENTIL HFA;VENTOLIN HFA) 108 (90 BASE) MCG/ACT inhaler Inhale 2 puffs into the lungs every 6 (six) hours as needed for wheezing or shortness of breath. 10/12/14   Lorayne Marek, MD  alendronate (FOSAMAX) 70 MG  tablet Take 70 mg by mouth once a week. Take with a full glass of water on an empty stomach.    Historical Provider, MD  aspirin EC 81 MG tablet Take 81 mg by mouth daily.    Historical Provider, MD  atorvastatin (LIPITOR) 40 MG tablet Take 1 tablet (40 mg total) by mouth daily. 10/12/14   Lorayne Marek, MD  buPROPion (WELLBUTRIN) 75 MG tablet Take 1 tablet (75 mg total) by mouth 2 (two) times daily. 09/25/14   Lorayne Marek, MD  cephALEXin (KEFLEX) 500 MG capsule Take 500 mg by mouth 3 (three) times daily. 10 day therapy course patient began on 06/05/2014 06/05/14   Historical Provider, MD  Cholecalciferol (VITAMIN D) 2000 UNITS CAPS Take 2,000 Units by mouth daily.    Historical Provider, MD  clonazePAM (KLONOPIN) 0.5 MG tablet Take 0.5 mg by mouth at bedtime.    Historical Provider, MD  diclofenac sodium (VOLTAREN) 1 % GEL Apply 2 g topically 4 (four) times daily as needed (for pain).     Historical Provider, MD  doxycycline (VIBRA-TABS) 100 MG tablet Take 1 tablet (100 mg total) by mouth 2 (two) times daily. Patient not taking: Reported on 09/25/2014 06/16/14   Garald Balding, NP  DULoxetine (CYMBALTA) 30 MG capsule Take 30 mg by mouth daily.    Historical Provider, MD  DULoxetine (CYMBALTA) 30 MG capsule TAKE 1 CAPSULE (30 MG TOTAL) BY MOUTH DAILY. 09/18/14   Charlett Blake, MD  famotidine (PEPCID) 20 MG tablet Take 1 tablet (20 mg total) by mouth 2 (two) times daily. 06/16/14   Garald Balding, NP  fish oil-omega-3 fatty acids 1000 MG capsule Take 1 g by mouth daily.      Historical Provider, MD  fluticasone (FLONASE) 50 MCG/ACT nasal spray Place 1 spray into both nostrils daily as needed for allergies or rhinitis.    Historical Provider, MD  metFORMIN (GLUCOPHAGE) 1000 MG tablet Take 1,000 mg by mouth 2 (two) times daily with a meal.      Historical Provider, MD  metoprolol (TOPROL-XL) 100 MG 24 hr tablet Take 100 mg by mouth daily.      Historical Provider, MD  Multiple Vitamin (MULTIVITAMIN WITH  MINERALS) TABS tablet Take 1 tablet by mouth daily. Centrum 50 +    Historical Provider, MD  nitroGLYCERIN (NITROLINGUAL) 0.4 MG/SPRAY spray Place 1 spray under the tongue every 5 (five) minutes x 3 doses as needed for chest pain. 10/12/14   Lorayne Marek, MD  omeprazole (PRILOSEC) 40 MG capsule Take 1 capsule (40 mg total) by mouth daily. 09/25/14   Lorayne Marek, MD  traMADol (ULTRAM) 50 MG tablet Take 100 mg by mouth every 6 (six) hours as needed (for pain.).    Historical Provider, MD  valsartan-hydrochlorothiazide (DIOVAN-HCT) 320-25 MG per tablet Take 1 tablet by mouth at bedtime. 09/25/14   Lorayne Marek, MD  vitamin E 400 UNIT capsule Take 400 Units by mouth daily.    Historical Provider, MD   BP 180/101 mmHg  Pulse 70  Temp(Src) 98.3 F (36.8 C) (Oral)  Resp 19  SpO2 96% Physical Exam  Constitutional: She is oriented to person, place, and time. She appears well-developed and well-nourished.  HENT:  Head: Normocephalic.  Mouth/Throat: Oropharynx is clear and moist. No oropharyngeal exudate.  Eyes: Conjunctivae and EOM are normal. Pupils are equal, round, and reactive to light.  Neck: Normal range of motion. Neck supple.  Cardiovascular: Normal rate, regular rhythm, normal heart sounds and intact distal pulses.  Exam reveals no gallop and no friction rub.   No murmur heard. Pulmonary/Chest: Effort normal and breath sounds normal. No respiratory distress. She has no wheezes.  Abdominal: Soft. Bowel sounds are normal. There is no tenderness. There is no rebound and no guarding.  Musculoskeletal: Normal range of motion. She exhibits no edema or tenderness.  Neurological: She is alert and oriented to person, place, and time.  alert, oriented x3 speech: normal in context and clarity memory: intact grossly cranial nerves II-XII: intact motor strength: full proximally and distally no involuntary movements or tremors sensation: intact to light touch diffusely  cerebellar: finger-to-nose  and heel-to-shin intact gait: normal forwards and backwards  Skin: Skin is warm and dry.  Psychiatric: She has a normal mood and affect. Her behavior is normal.  Nursing note and vitals reviewed.   ED Course  Procedures (including critical care time) [place], ED  I-STAT CHEM 8, ED    Imaging Review Ct Head Wo Contrast  10/20/2014   CLINICAL DATA:  Diffuse ha, pt states elevated bp x 3 days, Resection of Left Acoustic Neuroma  EXAM: CT HEAD WITHOUT CONTRAST  TECHNIQUE: Contiguous axial images were obtained from the base of the skull through the vertex without intravenous contrast.  COMPARISON:  11/07/2013  FINDINGS: Ventricles are normal in size and configuration.  No parenchymal masses or mass effect. No evidence of an infarct. Minor white matter hypoattenuation noted consistent with chronic microvascular ischemic change, stable.  No extra-axial masses or abnormal fluid collections.  There is no intracranial hemorrhage.  Status post left lateral suboccipital craniectomy with partial mastoidectomy, stable  Visualized sinuses and right mastoid air cells are clear.  IMPRESSION: 1. No acute intracranial abnormalities. 2. Minor chronic microvascular ischemic change, stable. 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   Electronically Signed   By: Lajean Manes M.D.   On: 10/20/2014 19:48     EKG Interpretation   Date/Time:  Tuesday October 20 2014 19:29:18 EST Ventricular Rate:  65 PR Interval:  149 QRS Duration: 79 QT Interval:  378 QTC Calculation: 393 R Axis:   33 Text Interpretation:  Sinus rhythm Probable anteroseptal infarct, old No  significant change since last tracing Confirmed by Korbin Notaro  MD, Markey Deady  ([id number]) on 10/21/2014 11:04:45 AM      MDM   Final diagnoses:  Headache    7:33 PM 64 y.o. female with a history of hypertension, diabetes, coronary artery disease who presents with hypertension. She states that she  has had elevated blood pressure since the beginning of the year. She notes her blood pressure today was running in the 170s over low 100s. She saw her PCP on the fourth did not add any new medications. She  denies any chest pain, shortness of breath, vision changes. She does note intermittent headaches over the last 2 weeks. She currently denies a headache and has a normal neurologic exam. We'll get labs and imaging.  9:18 PM: I interpreted/reviewed the labs and/or imaging which were non-contributory.  Pt feeling well on exam.  I have discussed the diagnosis/risks/treatment options with the patient and believe the pt to be eligible for discharge home to follow-up with her pcp to discuss her BP meds in the next few days. We also discussed returning to the ED immediately if new or worsening sx occur. We discussed the sx which are most concerning (e.g., HA, fever, AMS, vision changes, cp, sob) that necessitate immediate return. Medications administered to the patient during their visit and any new prescriptions provided to the patient are listed below.  Medications given during this visit Medications  hydrALAZINE (APRESOLINE) injection 5 mg (5 mg Intravenous Given 10/20/14 1952)  sodium chloride 0.9 % bolus 500 mL (500 mLs Intravenous New Bag/Given 10/20/14 1952)  acetaminophen (TYLENOL) tablet 650 mg (650 mg Oral Given 10/20/14 1952)    New Prescriptions   No medications on file     Pamella Pert, MD 10/21/14 1105

## 2014-10-20 NOTE — ED Notes (Signed)
Pt reports her blood pressure has been elevated today, home BP has been running around 177/107 this evening. Denies chest pain or sob. Her chronic dizziness is the same today as normal. Mild HA.

## 2014-10-20 NOTE — Telephone Encounter (Signed)
Pt callign to report that bp has been elevated for the last 3 days and would like to speak to nurse. Please f/u with pt.

## 2014-10-20 NOTE — ED Notes (Signed)
Report received from previous RN, Morey Hummingbird. Pt currently off the floor in CT.

## 2014-10-21 ENCOUNTER — Telehealth: Payer: Self-pay | Admitting: Internal Medicine

## 2014-10-21 ENCOUNTER — Telehealth: Payer: Self-pay | Admitting: Cardiovascular Disease

## 2014-10-21 NOTE — Telephone Encounter (Signed)
Spoke with pt. She reports elevated blood pressure since December. Readings listed below.  Has been seen in primary care.  Was in ED last night due to elevated blood pressure. Complaining of headaches off and on for last couple of weeks.  Spoke with primary care earlier today and was advised to come into walk in clinic tomorrow for evaluation.  Pt would like recommendation from cardiology regarding blood pressure.  She has issues with transportation and does not think she can get to primary care tomorrow. I encouraged her to try and get there.   She thinks she can arrange transportation for next week. I have scheduled pt for appt with Truitt Merle, NP on October 27, 2014 at 2:00.  Will review with Dr. Angelena Form to regarding possible changes to blood pressure medications prior to this visit.

## 2014-10-21 NOTE — Telephone Encounter (Signed)
New Msg      Pt c/o BP issue:  1. What are your last 5 BP readings? Today 150/95, 163/105, 171/105, 171/104, and last night 178/111  2. Are you having any other symptoms (ex. Dizziness, headache, blurred vision, passed out)? Pt having headaches, eye watering. No dizziness no blurred vision.  3. What is your medication issue? No medication issue   Pt would like a call back because her bp is not stable.

## 2014-10-21 NOTE — Telephone Encounter (Signed)
Left message to call back  

## 2014-10-21 NOTE — Telephone Encounter (Signed)
I would have her resume Norvasc 5 mg daily and keep appt with walk in primary care tomorrow. cdm

## 2014-10-21 NOTE — Telephone Encounter (Signed)
Pt stated call Cardiology too and wait for their call Advised to come for walking clinic tomorrow between 9-11 and 2-4

## 2014-10-21 NOTE — Telephone Encounter (Signed)
Patient called stating that her blood pressure has been high and had to go to the ED recently, patient would like to know what to do next. Please f/u with pt.

## 2014-10-22 ENCOUNTER — Telehealth: Payer: Self-pay | Admitting: Internal Medicine

## 2014-10-22 ENCOUNTER — Ambulatory Visit: Payer: 59

## 2014-10-22 NOTE — Telephone Encounter (Signed)
Pt would like to speak PCP or nurse, please f/u with pt.

## 2014-10-25 ENCOUNTER — Other Ambulatory Visit: Payer: Self-pay | Admitting: Physical Medicine & Rehabilitation

## 2014-10-27 ENCOUNTER — Ambulatory Visit (INDEPENDENT_AMBULATORY_CARE_PROVIDER_SITE_OTHER): Payer: 59 | Admitting: Nurse Practitioner

## 2014-10-27 ENCOUNTER — Encounter: Payer: Self-pay | Admitting: Nurse Practitioner

## 2014-10-27 VITALS — BP 148/90 | HR 67 | Ht 63.0 in | Wt 133.1 lb

## 2014-10-27 DIAGNOSIS — I10 Essential (primary) hypertension: Secondary | ICD-10-CM | POA: Diagnosis not present

## 2014-10-27 DIAGNOSIS — I2510 Atherosclerotic heart disease of native coronary artery without angina pectoris: Secondary | ICD-10-CM | POA: Diagnosis not present

## 2014-10-27 MED ORDER — HYDRALAZINE HCL 25 MG PO TABS: 25.0000 mg | ORAL_TABLET | Freq: Three times a day (TID) | ORAL | Status: DC

## 2014-10-27 NOTE — Telephone Encounter (Signed)
Spoke with pt who reports she did not receive message I left on October 21, 2014.  She is planning on being here for appt today at 2 PM and recommendations will be made at that appt.  Pt has not started Norvasc as she did not receive message.

## 2014-10-27 NOTE — Progress Notes (Signed)
CARDIOLOGY OFFICE NOTE  Date:  10/27/2014    Theresa Cline Date of Birth: December 16, 1950 Medical Record #[id number]  PCP:  Theresa Marek, MD  Cardiologist:  Theresa Cline    Chief Complaint  Patient presents with  . Hypertension    Post ER visit - seen for Dr. Angelena Cline     History of Present Illness: Theresa Cline is a 64 y.o. female who presents today for a post ER visit. Seen for Dr. Angelena Cline. She has a h/o CAD s/p MI and stents x 3 in 2004, HTN, PVD with carotid disease, Hyperlipidemia, and DM type II.  Her stress myoview in 2010 showed no ischemia. She is known to have mild carotid artery disease, last dopplers 2014. 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 She had this repeated by u/s March 2014 and the AAA is small.  Echo February 2014 with normal LV function, no valve issues.  She was last seen here in April of 2015.   In the ER last week with HTN. BP was 180/101. Given IV hydralazine and asked to follow up.   She is comes today for follow up. She is here with her daughter Theresa Cline. Had gotten lax with checking her BP. Then started having some headaches. On the day she went to the ER, she checked her BP - kept going high. No medicine changes were made. She continues to smoke - but now on Wellbutrin - which may be impacting her BP. She has had prior issues with swelling - none at present. No chest pain. Not short of breath. Headache is dull. She is real good about restricting her salt.   Past Medical History  Diagnosis Date  . S/P carpal tunnel release   . Vertigo   . Tobacco abuse   . Encounter for long-term (current) use of other medications   . Osteoporosis   . Left acoustic neuroma     Hx of  . Diabetic peripheral neuropathy   . Mixed hyperlipidemia   . Primary osteoarthritis of both knees   . Chronic pain syndrome   . Obesity   . Hypertension   . CAD (coronary artery disease)   . Diabetes mellitus type II,  controlled   . Bronchitis, acute   . Diabetes type 2, controlled   . Cancer     Renal s/p nephrectomy    Past Surgical History  Procedure Laterality Date  . Resection of left acoustic neuroma  1999  . Coronary angioplasty with stent placement  2004    Normal LV function  . Total abdominal hysterectomy w/ bilateral salpingoophorectomy  04/12/1988    For fibroid tumors; [place], [place]  . Carpal tunnel release  08/09/1998    Right  . Carpal tunnel release  09/08/1998    Left  . Removal lipoma      Right foot  . Cesarean section      x3     Medications: Current Outpatient Prescriptions  Medication Sig Dispense Refill  . albuterol (PROVENTIL HFA;VENTOLIN HFA) 108 (90 BASE) MCG/ACT inhaler Inhale 2 puffs into the lungs every 6 (six) hours as needed for wheezing or shortness of breath. 1 Inhaler 2  . alendronate (FOSAMAX) 70 MG tablet Take 70 mg by mouth once a week. Sunday    . aspirin EC 81 MG tablet Take 81 mg by mouth daily.    Marland Kitchen atorvastatin (LIPITOR) 40 MG tablet Take 1 tablet (40  mg total) by mouth daily. 120 tablet 2  . buPROPion (WELLBUTRIN) 75 MG tablet Take 1 tablet (75 mg total) by mouth 2 (two) times daily. 60 tablet 3  . Cholecalciferol (VITAMIN D) 2000 UNITS CAPS Take 2,000 Units by mouth daily.    . clonazePAM (KLONOPIN) 0.5 MG tablet Take 0.5 mg by mouth at bedtime.    . diclofenac sodium (VOLTAREN) 1 % GEL Apply 2 g topically 4 (four) times daily as needed (for pain).     . DULoxetine (CYMBALTA) 30 MG capsule TAKE 1 CAPSULE (30 MG TOTAL) BY MOUTH DAILY. 30 capsule 3  . fish oil-omega-3 fatty acids 1000 MG capsule Take 1 g by mouth daily.      . fluticasone (FLONASE) 50 MCG/ACT nasal spray Place 1 spray into both nostrils daily as needed for allergies or rhinitis.    . metFORMIN (GLUCOPHAGE) 1000 MG tablet Take 1,000 mg by mouth 2 (two) times daily with a meal.      . metoprolol (TOPROL-XL) 100 MG 24 hr tablet Take 100 mg by mouth daily.      . Multiple Vitamin  (MULTIVITAMIN WITH MINERALS) TABS tablet Take 1 tablet by mouth daily. Centrum 50 +    . nitroGLYCERIN (NITROLINGUAL) 0.4 MG/SPRAY spray Place 1 spray under the tongue every 5 (five) minutes x 3 doses as needed for chest pain. 12 g 1  . omeprazole (PRILOSEC) 40 MG capsule Take 1 capsule (40 mg total) by mouth daily. 90 capsule 1  . traMADol (ULTRAM) 50 MG tablet TAKE 2 TABLETS BY MOUTH EVERY 8 HOURS AS NEEDED FOR PAIN 180 tablet 2  . valsartan-hydrochlorothiazide (DIOVAN-HCT) 320-25 MG per tablet Take 1 tablet by mouth at bedtime. 30 tablet 3  . vitamin E 400 UNIT capsule Take 400 Units by mouth daily.     No current facility-administered medications for this visit.    Allergies: Allergies  Allergen Reactions  . Gabapentin Swelling  . Lyrica [Pregabalin] Swelling  . Adhesive [Tape] Rash    Social History: The patient  reports that she has been smoking Cigarettes.  She has a 20 pack-year smoking history. She has never used smokeless tobacco. She reports that she does not drink alcohol or use illicit drugs.   Family History: The patient's family history includes Alcohol abuse in her sister and sister; Bipolar disorder in her daughter; COPD in her mother; Diabetes type II in her mother, sister, sister, and sister; Glaucoma in her sister and sister; Heart disease in her sister; Hodgkin's lymphoma in her father; Hypertension in her mother, sister, and sister; Leukemia in her mother; Lupus in her daughter; Osteoarthritis in her mother; Osteoporosis in her sister; Other in her mother; Paranoid behavior in her brother; Schizophrenia in her sister.   Review of Systems: Please see the history of present illness.   Otherwise, the review of systems is positive for appetite change, hearing loss, back pain, muscle pain, dizziness, passing out, easy bruising, skipped heart beats, nausea, anxiety, joint swelling, balance problems and headaches.   All other systems are reviewed and negative.   Physical  Exam: VS:  BP 148/90 mmHg  Pulse 67  Ht 5\' 3"  (1.6 m)  Wt 133 lb 1.9 oz (60.383 kg)  BMI 23.59 kg/m2  SpO2 97% .  BMI Body mass index is 23.59 kg/(m^2).  BP is 150/90 by me.  General: Pleasant. Well developed, well nourished and in no acute distress.  HEENT: Normal. Neck: Supple, no JVD, carotid bruits, or masses noted.  Cardiac:  Regular rate and rhythm. No murmurs, rubs, or gallops. No edema.  Respiratory:  Lungs are clear to auscultation bilaterally with normal work of breathing.  GI: Soft and nontender.  MS: No deformity or atrophy. Gait and ROM intact. Skin: Warm and dry. Color is normal.  Neuro:  Strength and sensation are intact and no gross focal deficits noted.  Psych: Alert, appropriate and with normal affect.   Wt Readings from Last 3 Encounters:  10/27/14 133 lb 1.9 oz (60.383 kg)  09/25/14 135 lb (61.236 kg)  05/04/14 139 lb (63.05 kg)    LABORATORY DATA:  EKG:  EKG is not ordered today. EKG from the ER 10/21/2014 with NSR with old anteroseptal MI.    Lab Results  Component Value Date   WBC 7.7 09/25/2014   HGB 14.6 10/20/2014   HCT 43.0 10/20/2014   PLT 237 09/25/2014   GLUCOSE 93 10/20/2014   CHOL 144 09/25/2014   TRIG 71 09/25/2014   HDL 48 09/25/2014   LDLCALC 82 09/25/2014   ALT <8 09/25/2014   AST 10 09/25/2014   NA 137 10/20/2014   K 3.7 10/20/2014   CL 98 10/20/2014   CREATININE 1.00 10/20/2014   BUN 13 10/20/2014   CO2 30 09/25/2014   HGBA1C 6.2 09/25/2014   MICROALBUR 1.10 11/06/2008    BNP (last 3 results) No results for input(s): PROBNP in the last 8760 hours.  Other Studies Reviewed Today:  Echo Study Conclusions from 11/2012  - Left ventricle: The cavity size was normal. Wall thickness was normal. Systolic function was normal. The estimated ejection fraction was in the range of 55% to 60%. Wall motion was normal; there were no regional wall motion abnormalities. Doppler parameters are consistent with abnormal left  ventricular relaxation (grade 1 diastolic dysfunction). - Atrial septum: There was redundancy of the septum, with borderline criteria for aneurysm. - Pulmonary arteries: Systolic pressure was mildly increased. PA peak pressure: 27mm Hg (S).    Assessment/Plan: 1. HTN - Dr. Angelena Cline had suggested Norvasc but she has had issues with swelling - will try Hydralazine 25 mg TID. See back in 3 weeks. Her cuff (wrist unit) does not correlate - will try to get her a new one.  2. CAD: Stable. Continue current therapy. She is on an ASA. No changes. Lipids are followed in primary care.   2. Carotid artery disease: checked in July of 2015.  3. AAA: Known history of AAA. Mild by u/s March 2014. Repeat March 2016.   4. Lower extremity edema: resolved.  5. Tobacco abuse - trying to cut down - now on Wellbutrin.   Current medicines are reviewed with the patient today.  The patient does not have concerns regarding medicines.  The following changes have been made:  See above  Labs/ tests ordered today include: N/A   No orders of the defined types were placed in this encounter.    Disposition:   FU with me in 3 weeks  Patient is agreeable to this plan and will call if any problems develop in the interim.   Signed: Burtis Junes, RN, ANP-C 10/27/2014 2:26 PM  [facility] [address] [place], [place]  [address] Phone: [phone] Fax: [phone]

## 2014-10-27 NOTE — Patient Instructions (Addendum)
Stay on your current medicines  Try to stop smoking   I am adding Hydralazine 25 mg three times a day  Try to get a new BP cuff  Keep restricting your salt.   I will see you in about 3 weeks.   Call the [facility] office at [phone] if you have any questions, problems or concerns.

## 2014-11-03 DIAGNOSIS — H524 Presbyopia: Secondary | ICD-10-CM | POA: Diagnosis not present

## 2014-11-04 DIAGNOSIS — M1712 Unilateral primary osteoarthritis, left knee: Secondary | ICD-10-CM | POA: Diagnosis not present

## 2014-11-05 ENCOUNTER — Ambulatory Visit (HOSPITAL_BASED_OUTPATIENT_CLINIC_OR_DEPARTMENT_OTHER): Payer: 59 | Admitting: Physical Medicine & Rehabilitation

## 2014-11-05 ENCOUNTER — Encounter: Payer: Medicare Other | Attending: Physical Medicine & Rehabilitation

## 2014-11-05 ENCOUNTER — Encounter: Payer: Self-pay | Admitting: Physical Medicine & Rehabilitation

## 2014-11-05 VITALS — BP 173/94 | HR 67 | Resp 14

## 2014-11-05 DIAGNOSIS — M4806 Spinal stenosis, lumbar region: Secondary | ICD-10-CM | POA: Insufficient documentation

## 2014-11-05 DIAGNOSIS — M48061 Spinal stenosis, lumbar region without neurogenic claudication: Secondary | ICD-10-CM

## 2014-11-05 NOTE — Progress Notes (Signed)
Subjective:    Patient ID: Theresa Cline, female    DOB: 05-Sep-1951, 64 y.o.   MRN: [id number]  HPI No new complaints Now using hospital bed, wanted adjustable bed to increase sleep comfort Had one fall since last visit 6 months ago. This was on the patio in late fall of 2015. No significant injuries, scraped her knee. Using therabands for exercise "[address]"  Still dropping objects with both hands Pain Inventory Average Pain 0 Pain Right Now 0 My pain is n/a  In the last 24 hours, has pain interfered with the following? General activity 5 Relation with others 5 Enjoyment of life 5 What TIME of day is your pain at its worst? evening and night Sleep (in general) Poor  Pain is worse with: bending and some activites Pain improves with: rest and medication Relief from Meds: 9  Mobility use a cane how many minutes can you walk? 45 ability to climb steps?  no do you drive?  no  Function retired  Neuro/Psych weakness numbness trouble walking dizziness confusion anxiety loss of taste or smell  Prior Studies Any changes since last visit?  no  Physicians involved in your care Any changes since last visit?  no   Family History  Problem Relation Age of Onset  . Leukemia Mother     CLL  . Diabetes type II Mother   . Other Mother     Hypercholesterolemia  . Osteoarthritis Mother   . COPD Mother   . Hypertension Mother   . Hodgkin's lymphoma Father   . Diabetes type II Sister   . Osteoporosis Sister   . Paranoid behavior Brother     Unsure if diagnosed with Schizophrenia  . Diabetes type II Sister   . Hypertension Sister   . Diabetes type II Sister   . Hypertension Sister   . Schizophrenia Sister   . Heart disease Sister     Enlarged heart  . Glaucoma Sister   . Alcohol abuse Sister   . Glaucoma Sister   . Alcohol abuse Sister   . Bipolar disorder Daughter   . Lupus Daughter     SLE   History   Social History  . Marital Status: Divorced     Spouse Name: N/A    Number of Children: N/A  . Years of Education: N/A   Occupational History  . 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 Smoking status: Current Every Day Smoker -- 0.50 packs/day for 40 years    Types: Cigarettes  . Smokeless tobacco: Never Used     Comment: smoking 4-5 cigs/day  . Alcohol Use: No  . Drug Use: No  . Sexual Activity: None   Other Topics Concern  . None   Social History Narrative   Divorced   Disability mainly for back and chronic intermittent vertigo - since tumor removed - no surgical correction for back   Lives alone, keeps her grandson, 66 yr old daughter is a mother   Past Surgical History  Procedure Laterality Date  . Resection of left acoustic neuroma  1999  . Coronary angioplasty with stent placement  2004    Normal LV function  . Total abdominal hysterectomy w/ bilateral salpingoophorectomy  04/12/1988    For fibroid tumors; [place], [place]  . Carpal tunnel release  08/09/1998    Right  . Carpal tunnel release  09/08/1998  Left  . Removal lipoma      Right foot  . Cesarean section      x3   Past Medical History  Diagnosis Date  . S/P carpal tunnel release   . Vertigo   . Tobacco abuse   . Encounter for long-term (current) use of other medications   . Osteoporosis   . Left acoustic neuroma     Hx of  . Diabetic peripheral neuropathy   . Mixed hyperlipidemia   . Primary osteoarthritis of both knees   . Chronic pain syndrome   . Obesity   . Hypertension   . CAD (coronary artery disease)   . Diabetes mellitus type II, controlled   . Bronchitis, acute   . Diabetes type 2, controlled   . Cancer     Renal s/p nephrectomy   BP 173/94 mmHg  Pulse 67  Resp 14  SpO2 94%  Opioid Risk Score:   Fall Risk Score: Moderate Fall Risk (6-13 points) (educated and previously given handout)  Review of Systems  Constitutional: Positive for appetite change.  Respiratory:  Positive for apnea.   Cardiovascular: Positive for leg swelling.  Musculoskeletal: Positive for gait problem.  Neurological: Positive for dizziness, weakness and numbness.  Psychiatric/Behavioral: Positive for confusion. The patient is nervous/anxious.   All other systems reviewed and are negative.      Objective:   Physical Exam  Constitutional: She is oriented to person, place, and time. She appears well-developed and well-nourished.  HENT:  Head: Normocephalic and atraumatic.  Eyes: Pupils are equal, round, and reactive to light.  Neck: Normal range of motion.  Neurological: She is alert and oriented to person, place, and time.  Psychiatric: She has a normal mood and affect.  Nursing note and vitals reviewed.  lumbar range of motion: 100% flexion extension lateral bending and rotation  Motor strength 5/5 bilateral deltoids, biceps, triceps, grip, hip flexor, knee extensor, ankle dorsiflexion and plantar flexor     Assessment & Plan:   1. Lumbar spinal stenosis doing very well in this regard. No evidence of neurogenic claudication or radiculopathy. Getting good relief from tramadol 2 tablets in the morning and 2 tablets at night Follow-up in 6 months  2. Carpal tunnel syndrome, has had nerve release but persistent symptoms. Likely has permanent nerve damage.  3. Hypertension this is being managed by cardiology. She does have a follow-up appointment next mo

## 2014-11-19 ENCOUNTER — Telehealth: Payer: Self-pay | Admitting: Internal Medicine

## 2014-11-19 NOTE — Telephone Encounter (Signed)
Lanny Hurst called checking on the status of an order sent on 10-30-14 , re faxed it 11-18-14. Fax # to send order is [phone]. Please f/u

## 2014-11-20 ENCOUNTER — Encounter: Payer: Self-pay | Admitting: Nurse Practitioner

## 2014-11-20 ENCOUNTER — Ambulatory Visit (INDEPENDENT_AMBULATORY_CARE_PROVIDER_SITE_OTHER): Payer: 59 | Admitting: Nurse Practitioner

## 2014-11-20 VITALS — BP 154/78 | HR 64 | Ht 63.0 in | Wt 135.2 lb

## 2014-11-20 DIAGNOSIS — Z720 Tobacco use: Secondary | ICD-10-CM | POA: Diagnosis not present

## 2014-11-20 DIAGNOSIS — I2510 Atherosclerotic heart disease of native coronary artery without angina pectoris: Secondary | ICD-10-CM

## 2014-11-20 DIAGNOSIS — I10 Essential (primary) hypertension: Secondary | ICD-10-CM | POA: Diagnosis not present

## 2014-11-20 DIAGNOSIS — I714 Abdominal aortic aneurysm, without rupture, unspecified: Secondary | ICD-10-CM

## 2014-11-20 MED ORDER — HYDRALAZINE HCL 50 MG PO TABS: 50.0000 mg | ORAL_TABLET | Freq: Three times a day (TID) | ORAL | Status: DC

## 2014-11-20 MED ORDER — CEPHALEXIN 250 MG PO CAPS
250.0000 mg | ORAL_CAPSULE | Freq: Four times a day (QID) | ORAL | Status: DC
Start: 2014-11-20 — End: 2015-02-16

## 2014-11-20 NOTE — Progress Notes (Signed)
CARDIOLOGY OFFICE NOTE  Date:  11/20/2014    [name] Date of Birth: 10-Mar-1951 Medical Record #[id number]  PCP:  Lorayne Marek, MD  Cardiologist:  [facility]    Chief Complaint  Patient presents with  . Hypertension    3 week check - seen for Dr. Angelena Form     History of Present Illness: Theresa Cline is a 64 y.o. female who presents today for a follow up visit. Seen for Dr. Angelena Form. She has a h/o CAD s/p MI and stents x 3 in 2004, HTN, PVD with carotid disease, Hyperlipidemia, very mild AAA per u/s back in 2014 and DM type II. Her stress myoview in 2010 showed no ischemia. She is known to have mild carotid artery disease, last dopplers 2014.  Echo February 2014 with normal LV function, no valve issues.   In the ER back in January with HTN. BP was 180/101. Given IV hydralazine and asked to follow up.   I saw her about 3 weeks ago for that post ER visit - I added oral hydralazine. I avoided adding Norvasc due to issues with edema.   She is comes today for follow up. She is here with her daughter Levada Dy today. Older daughter has sent her a new BP cuff - will be getting tomorrow. Her old cuff did not correlate. No chest pain. Not short of breath. Has a headache today but she says this feels different. More swelling in her left lower leg with some redness and tenderness. She has had cellulitis in the past. She continues to restrict her salt. No fever or chills that she is aware of. Feels ok on her current medicines. No allergy to antibiotics. She is fasting today.   Past Medical History  Diagnosis Date  . S/P carpal tunnel release   . Vertigo   . Tobacco abuse   . Encounter for long-term (current) use of other medications   . Osteoporosis   . Left acoustic neuroma     Hx of  . Diabetic peripheral neuropathy   . Mixed hyperlipidemia   . Primary osteoarthritis of both knees   . Chronic pain syndrome   . Obesity   . Hypertension   . CAD (coronary artery  disease)   . Diabetes mellitus type II, controlled   . Bronchitis, acute   . Diabetes type 2, controlled   . Cancer     Renal s/p nephrectomy    Past Surgical History  Procedure Laterality Date  . Resection of left acoustic neuroma  1999  . Coronary angioplasty with stent placement  2004    Normal LV function  . Total abdominal hysterectomy w/ bilateral salpingoophorectomy  04/12/1988    For fibroid tumors; [place], [place]  . Carpal tunnel release  08/09/1998    Right  . Carpal tunnel release  09/08/1998    Left  . Removal lipoma      Right foot  . Cesarean section      x3     Medications: Current Outpatient Prescriptions  Medication Sig Dispense Refill  . albuterol (PROVENTIL HFA;VENTOLIN HFA) 108 (90 BASE) MCG/ACT inhaler Inhale 2 puffs into the lungs every 6 (six) hours as needed for wheezing or shortness of breath. 1 Inhaler 2  . alendronate (FOSAMAX) 70 MG tablet Take 70 mg by mouth once a week. Sunday    . aspirin EC 81 MG tablet Take 81 mg by mouth daily.    Marland Kitchen atorvastatin (LIPITOR) 40 MG tablet Take 1  tablet (40 mg total) by mouth daily. 120 tablet 2  . buPROPion (WELLBUTRIN) 75 MG tablet Take 1 tablet (75 mg total) by mouth 2 (two) times daily. 60 tablet 3  . Cholecalciferol (VITAMIN D) 2000 UNITS CAPS Take 2,000 Units by mouth daily.    . clonazePAM (KLONOPIN) 0.5 MG tablet Take 0.5 mg by mouth at bedtime.    . diclofenac sodium (VOLTAREN) 1 % GEL Apply 2 g topically 4 (four) times daily as needed (for pain).     . DULoxetine (CYMBALTA) 30 MG capsule TAKE 1 CAPSULE (30 MG TOTAL) BY MOUTH DAILY. 30 capsule 3  . fish oil-omega-3 fatty acids 1000 MG capsule Take 1 g by mouth daily.      . fluticasone (FLONASE) 50 MCG/ACT nasal spray Place 1 spray into both nostrils daily as needed for allergies or rhinitis.    . metFORMIN (GLUCOPHAGE) 1000 MG tablet Take 1,000 mg by mouth 2 (two) times daily with a meal.      . metoprolol (TOPROL-XL) 100 MG 24 hr tablet Take 100 mg by  mouth daily.      . Multiple Vitamin (MULTIVITAMIN WITH MINERALS) TABS tablet Take 1 tablet by mouth daily. Centrum 50 +    . nitroGLYCERIN (NITROLINGUAL) 0.4 MG/SPRAY spray Place 1 spray under the tongue every 5 (five) minutes x 3 doses as needed for chest pain. 12 g 1  . omeprazole (PRILOSEC) 40 MG capsule Take 1 capsule (40 mg total) by mouth daily. 90 capsule 1  . traMADol (ULTRAM) 50 MG tablet TAKE 2 TABLETS BY MOUTH EVERY 8 HOURS AS NEEDED FOR PAIN 180 tablet 2  . valsartan-hydrochlorothiazide (DIOVAN-HCT) 320-25 MG per tablet Take 1 tablet by mouth at bedtime. 30 tablet 3  . vitamin E 400 UNIT capsule Take 400 Units by mouth daily.    . cephALEXin (KEFLEX) 250 MG capsule Take 1 capsule (250 mg total) by mouth 4 (four) times daily. 20 capsule 0  . hydrALAZINE (APRESOLINE) 50 MG tablet Take 1 tablet (50 mg total) by mouth 3 (three) times daily. 270 tablet 3   No current facility-administered medications for this visit.    Allergies: Allergies  Allergen Reactions  . Gabapentin Swelling  . Lyrica [Pregabalin] Swelling  . Adhesive [Tape] Rash    Social History: The patient  reports that she has been smoking Cigarettes.  She has a 20 pack-year smoking history. She has never used smokeless tobacco. She reports that she does not drink alcohol or use illicit drugs.   Family History: The patient's family history includes Alcohol abuse in her sister and sister; Bipolar disorder in her daughter; COPD in her mother; Diabetes type II in her mother, sister, sister, and sister; Glaucoma in her sister and sister; Heart disease in her sister; Hodgkin's lymphoma in her father; Hypertension in her mother, sister, and sister; Leukemia in her mother; Lupus in her daughter; Osteoarthritis in her mother; Osteoporosis in her sister; Other in her mother; Paranoid behavior in her brother; Schizophrenia in her sister.   Review of Systems: Please see the history of present illness.   Otherwise, the review of  systems is positive for +leg swelling, back pain, muscle pain, dizziness, easy bruising, N/V when she has vertigo, joint swelling, balance issues and headaches.   All other systems are reviewed and negative.   Physical Exam: VS:  BP 154/78 mmHg  Pulse 64  Ht 5\' 3"  (1.6 m)  Wt 135 lb 3 oz (61.321 kg)  BMI 23.95 kg/m2 .  BMI Body mass index is 23.95 kg/(m^2).   BP by me is 162/90  Wt Readings from Last 3 Encounters:  11/20/14 135 lb 3 oz (61.321 kg)  10/27/14 133 lb 1.9 oz (60.383 kg)  09/25/14 135 lb (61.236 kg)    General: Pleasant. Well developed, well nourished and in no acute distress.  HEENT: Normal. Neck: Supple, no JVD, carotid bruits, or masses noted.  Cardiac: Regular rate and rhythm. No murmurs, rubs, or gallops. Trace edema bilaterally. Her left lower leg is somewhat red, tender and warm to touch.  Respiratory:  Lungs are clear to auscultation bilaterally with normal work of breathing.  GI: Soft and nontender.  MS: No deformity or atrophy. Gait and ROM intact. Skin: Warm and dry. Color is normal.  Neuro:  Strength and sensation are intact and no gross focal deficits noted.  Psych: Alert, appropriate and with normal affect.   LABORATORY DATA:  EKG:  EKG is not ordered today.   Lab Results  Component Value Date   WBC 7.7 09/25/2014   HGB 14.6 10/20/2014   HCT 43.0 10/20/2014   PLT 237 09/25/2014   GLUCOSE 93 10/20/2014   CHOL 144 09/25/2014   TRIG 71 09/25/2014   HDL 48 09/25/2014   LDLCALC 82 09/25/2014   ALT <8 09/25/2014   AST 10 09/25/2014   NA 137 10/20/2014   K 3.7 10/20/2014   CL 98 10/20/2014   CREATININE 1.00 10/20/2014   BUN 13 10/20/2014   CO2 30 09/25/2014   HGBA1C 6.2 09/25/2014   MICROALBUR 1.10 11/06/2008    BNP (last 3 results) No results for input(s): BNP in the last 8760 hours.  ProBNP (last 3 results) No results for input(s): PROBNP in the last 8760 hours.   Other Studies Reviewed Today:  Echo Study Conclusions from  11/2012  - Left ventricle: The cavity size was normal. Wall thickness was normal. Systolic function was normal. The estimated ejection fraction was in the range of 55% to 60%. Wall motion was normal; there were no regional wall motion abnormalities. Doppler parameters are consistent with abnormal left ventricular relaxation (grade 1 diastolic dysfunction). - Atrial septum: There was redundancy of the septum, with borderline criteria for aneurysm. - Pulmonary arteries: Systolic pressure was mildly increased. PA peak pressure: 58mm Hg (S).  CAROTID DUPLEX IMPRESSION FROM 04/2014: Atherosclerotic disease in the carotid arteries bilaterally, left side greater than right. Estimated degree of stenosis in the internal carotid arteries is less than 50% bilaterally.  Patent vertebral arteries.  Electronically Signed  By: Markus Daft M.D.  On: 04/14/2014 14:04   Assessment/Plan:  1. HTN - She has not had her medicines yet today but I suspect she is still not at goal. Will increase her hydralazine to 50 mg TID. See back as planned. She will start monitoring her BP at home when she gets her new cuff and will call us if BP remains higher than 140/90. Would still try to avoid Norvasc due to her swelling.   2. CAD: Stable. Continue current therapy. She is on an ASA. No changes. Lipids are followed in primary care.   2. Carotid artery disease: checked in July of 2015.  3. AAA: Known history of very mild AAA per u/s March 2014. For repeat study this year - will arrange.   4. Lower extremity edema: looks to have some cellulitis today - will give Keflex 250 mg QID for 5 days  5. Tobacco abuse - trying to cut down - now on Wellbutrin.  Current medicines are reviewed with the patient today.  The patient does not have concerns regarding medicines other than what has been noted above.  The following changes have been made:  See above.  Labs/ tests ordered today include:     Orders Placed This Encounter  Procedures  . Abdominal Aortic Aneurysm duplex    Disposition:   FU with Dr. Angelena Form in May 2016.   Patient is agreeable to this plan and will call if any problems develop in the interim.   Signed: Burtis Junes, RN, ANP-C 11/20/2014 8:18 AM  [facility] [address] [place], [place]  [address] Phone: [phone] Fax: [phone]

## 2014-11-20 NOTE — Patient Instructions (Addendum)
Stay on your current medicines but I am increasing the Hydralazine to 50 mg three times a day - this is at your drug store - you can take 2 of the 25 mg three times a day to use those up.  I have also sent in a RX for Keflex 250 mg to take four times a day for the next 5 days  Monitor your BP at home - keep a record -  Call us if your BP is not staying below 140/90  Keep working on your smoking!  See Dr. Angelena Form in May as planned.  Call the [place] office at [phone] if you have any questions, problems or concerns.   Your physician has requested that you have an abdominal aorta duplex. During this test, an ultrasound is used to evaluate the aorta. Allow 30 minutes for this exam. Do not eat after midnight the day before and avoid carbonated beverages

## 2014-11-30 ENCOUNTER — Ambulatory Visit (HOSPITAL_COMMUNITY): Payer: Medicare Other | Attending: Cardiovascular Disease | Admitting: Cardiology

## 2014-11-30 DIAGNOSIS — I714 Abdominal aortic aneurysm, without rupture, unspecified: Secondary | ICD-10-CM

## 2014-11-30 DIAGNOSIS — Z720 Tobacco use: Secondary | ICD-10-CM | POA: Diagnosis not present

## 2014-11-30 DIAGNOSIS — I10 Essential (primary) hypertension: Secondary | ICD-10-CM | POA: Diagnosis not present

## 2014-11-30 DIAGNOSIS — I2510 Atherosclerotic heart disease of native coronary artery without angina pectoris: Secondary | ICD-10-CM

## 2014-11-30 NOTE — Progress Notes (Signed)
Abdominal Aorta Duplex performed  

## 2014-12-05 DIAGNOSIS — M1712 Unilateral primary osteoarthritis, left knee: Secondary | ICD-10-CM | POA: Diagnosis not present

## 2014-12-08 ENCOUNTER — Other Ambulatory Visit: Payer: Self-pay | Admitting: Physical Medicine & Rehabilitation

## 2014-12-08 NOTE — Telephone Encounter (Signed)
Your note says getting goo relief from 2 tramadol in am and 2 in pm.  Rx is for 2 tid #180.  Do I refill it as the pharmacy requests? HEr next appt

## 2014-12-20 ENCOUNTER — Encounter (HOSPITAL_COMMUNITY): Payer: Self-pay | Admitting: Emergency Medicine

## 2014-12-20 ENCOUNTER — Emergency Department (INDEPENDENT_AMBULATORY_CARE_PROVIDER_SITE_OTHER)
Admission: EM | Admit: 2014-12-20 | Discharge: 2014-12-20 | Disposition: A | Discharge status: Home / Self Care | Payer: Medicare Other | Source: Home / Self Care | Attending: Family Medicine | Admitting: Family Medicine

## 2014-12-20 DIAGNOSIS — F17210 Nicotine dependence, cigarettes, uncomplicated: Secondary | ICD-10-CM

## 2014-12-20 DIAGNOSIS — J209 Acute bronchitis, unspecified: Secondary | ICD-10-CM | POA: Diagnosis not present

## 2014-12-20 DIAGNOSIS — J40 Bronchitis, not specified as acute or chronic: Secondary | ICD-10-CM | POA: Diagnosis not present

## 2014-12-20 MED ORDER — IPRATROPIUM-ALBUTEROL 0.5-2.5 (3) MG/3ML IN SOLN
RESPIRATORY_TRACT | Status: AC
  Filled 2014-12-20: qty 3

## 2014-12-20 MED ORDER — ALBUTEROL SULFATE HFA 108 (90 BASE) MCG/ACT IN AERS: 2.0000 | INHALATION_SPRAY | Freq: Four times a day (QID) | RESPIRATORY_TRACT | Status: DC | PRN

## 2014-12-20 MED ORDER — IPRATROPIUM-ALBUTEROL 0.5-2.5 (3) MG/3ML IN SOLN
3.0000 mL | Freq: Once | RESPIRATORY_TRACT | Status: AC
  Administered 2014-12-20: 3 mL via RESPIRATORY_TRACT

## 2014-12-20 MED ORDER — IPRATROPIUM BROMIDE 0.06 % NA SOLN: 2.0000 | Freq: Four times a day (QID) | NASAL | Status: DC

## 2014-12-20 NOTE — ED Provider Notes (Signed)
Theresa Cline is a 64 y.o. female who presents to Urgent Care today for Cough congestion headache body aches wheezing runny nose. Symptoms present for about 4 days. No vomiting or diarrhea. No chest pains or palpitations. Patient has tried some over-the-counter medications which help. She has a history of diabetes that is controlled with metformin. She is a current daily smoker.   Past Medical History  Diagnosis Date  . S/P carpal tunnel release   . Vertigo   . Tobacco abuse   . Encounter for long-term (current) use of other medications   . Osteoporosis   . Left acoustic neuroma     Hx of  . Diabetic peripheral neuropathy   . Mixed hyperlipidemia   . Primary osteoarthritis of both knees   . Chronic pain syndrome   . Obesity   . Hypertension   . CAD (coronary artery disease)   . Diabetes mellitus type II, controlled   . Bronchitis, acute   . Diabetes type 2, controlled   . Cancer     Renal s/p nephrectomy   Past Surgical History  Procedure Laterality Date  . Resection of left acoustic neuroma  1999  . Coronary angioplasty with stent placement  2004    Normal LV function  . Total abdominal hysterectomy w/ bilateral salpingoophorectomy  04/12/1988    For fibroid tumors; [place], [place]  . Carpal tunnel release  08/09/1998    Right  . Carpal tunnel release  09/08/1998    Left  . Removal lipoma      Right foot  . Cesarean section      x3   History  Substance Use Topics  . Smoking status: Current Every Day Smoker -- 0.50 packs/day for 40 years    Types: Cigarettes  . Smokeless tobacco: Never Used     Comment: smoking 4-5 cigs/day  . Alcohol Use: No   ROS as above Medications: No current facility-administered medications for this encounter.   Current Outpatient Prescriptions  Medication Sig Dispense Refill  . alendronate (FOSAMAX) 70 MG tablet Take 70 mg by mouth once a week. Sunday    . aspirin EC 81 MG tablet Take 81 mg by mouth daily.    Marland Kitchen atorvastatin  (LIPITOR) 40 MG tablet Take 1 tablet (40 mg total) by mouth daily. 120 tablet 2  . buPROPion (WELLBUTRIN) 75 MG tablet Take 1 tablet (75 mg total) by mouth 2 (two) times daily. 60 tablet 3  . clonazePAM (KLONOPIN) 0.5 MG tablet Take 0.5 mg by mouth at bedtime.    . DULoxetine (CYMBALTA) 30 MG capsule TAKE 1 CAPSULE (30 MG TOTAL) BY MOUTH DAILY. 30 capsule 3  . fish oil-omega-3 fatty acids 1000 MG capsule Take 1 g by mouth daily.      . metFORMIN (GLUCOPHAGE) 1000 MG tablet Take 1,000 mg by mouth 2 (two) times daily with a meal.      . metoprolol (TOPROL-XL) 100 MG 24 hr tablet Take 100 mg by mouth daily.      Marland Kitchen omeprazole (PRILOSEC) 40 MG capsule Take 1 capsule (40 mg total) by mouth daily. 90 capsule 1  . valsartan-hydrochlorothiazide (DIOVAN-HCT) 320-25 MG per tablet Take 1 tablet by mouth at bedtime. 30 tablet 3  . vitamin E 400 UNIT capsule Take 400 Units by mouth daily.    Marland Kitchen albuterol (PROVENTIL HFA;VENTOLIN HFA) 108 (90 BASE) MCG/ACT inhaler Inhale 2 puffs into the lungs every 6 (six) hours as needed for wheezing or shortness of breath. 1 Inhaler 2  .  cephALEXin (KEFLEX) 250 MG capsule Take 1 capsule (250 mg total) by mouth 4 (four) times daily. 20 capsule 0  . Cholecalciferol (VITAMIN D) 2000 UNITS CAPS Take 2,000 Units by mouth daily.    . diclofenac sodium (VOLTAREN) 1 % GEL Apply 2 g topically 4 (four) times daily as needed (for pain).     . fluticasone (FLONASE) 50 MCG/ACT nasal spray Place 1 spray into both nostrils daily as needed for allergies or rhinitis.    . hydrALAZINE (APRESOLINE) 50 MG tablet Take 1 tablet (50 mg total) by mouth 3 (three) times daily. 270 tablet 3  . ipratropium (ATROVENT) 0.06 % nasal spray Place 2 sprays into both nostrils 4 (four) times daily. 15 mL 1  . Multiple Vitamin (MULTIVITAMIN WITH MINERALS) TABS tablet Take 1 tablet by mouth daily. Centrum 50 +    . nitroGLYCERIN (NITROLINGUAL) 0.4 MG/SPRAY spray Place 1 spray under the tongue every 5 (five) minutes  x 3 doses as needed for chest pain. 12 g 1  . traMADol (ULTRAM) 50 MG tablet TAKE 2 TABLETS BY MOUTH EVERY 8 HOURS AS NEEDED FOR PAIN 180 tablet 5   Allergies  Allergen Reactions  . Gabapentin Swelling  . Lyrica [Pregabalin] Swelling  . Adhesive [Tape] Rash     Exam:  BP 107/54 mmHg  Pulse 67  Temp(Src) 98.2 F (36.8 C) (Oral)  Resp 16  SpO2 97% Gen: Well NAD HEENT: EOMI,  MMM clear nasal discharge. Normal tympanic membranes bilaterally Lungs: Normal work of breathing. Coarse breath sounds bilaterally Heart: RRR no MRG Abd: NABS, Soft. Nondistended, Nontender Exts: Brisk capillary refill, warm and well perfused.   Patient was given a 2.5/0.5 DuoNeb nebulizer treatment, and Felt better  No results found for this or any previous visit (from the past 24 hour(s)). No results found.  Assessment and Plan: 64 y.o. female with bronchitis. Treatment with albuterol and Atrovent. Avoid prednisone due to relatively poorly controlled diabetes. If not better will use prednisone.  Discussed warning signs or symptoms. Please see discharge instructions. Patient expresses understanding.     Gregor Hams, MD 12/20/14 [phone]

## 2014-12-20 NOTE — Discharge Instructions (Signed)
Thank you for coming in today. Call or go to the emergency room if you get worse, have trouble breathing, have chest pains, or palpitations.  Return if not better and we will give your prednisone.   Acute Bronchitis Bronchitis is inflammation of the airways that extend from the windpipe into the lungs (bronchi). The inflammation often causes mucus to develop. This leads to a cough, which is the most common symptom of bronchitis.  In acute bronchitis, the condition usually develops suddenly and goes away over time, usually in a couple weeks. Smoking, allergies, and asthma can make bronchitis worse. Repeated episodes of bronchitis may cause further lung problems.  CAUSES Acute bronchitis is most often caused by the same virus that causes a cold. The virus can spread from person to person (contagious) through coughing, sneezing, and touching contaminated objects. SIGNS AND SYMPTOMS   Cough.   Fever.   Coughing up mucus.   Body aches.   Chest congestion.   Chills.   Shortness of breath.   Sore throat.  DIAGNOSIS  Acute bronchitis is usually diagnosed through a physical exam. Your health care provider will also ask you questions about your medical history. Tests, such as chest X-rays, are sometimes done to rule out other conditions.  TREATMENT  Acute bronchitis usually goes away in a couple weeks. Oftentimes, no medical treatment is necessary. Medicines are sometimes given for relief of fever or cough. Antibiotic medicines are usually not needed but may be prescribed in certain situations. In some cases, an inhaler may be recommended to help reduce shortness of breath and control the cough. A cool mist vaporizer may also be used to help thin bronchial secretions and make it easier to clear the chest.  HOME CARE INSTRUCTIONS  Get plenty of rest.   Drink enough fluids to keep your urine clear or pale yellow (unless you have a medical condition that requires fluid restriction).  Increasing fluids may help thin your respiratory secretions (sputum) and reduce chest congestion, and it will prevent dehydration.   Take medicines only as directed by your health care provider.  If you were prescribed an antibiotic medicine, finish it all even if you start to feel better.  Avoid smoking and secondhand smoke. Exposure to cigarette smoke or irritating chemicals will make bronchitis worse. If you are a smoker, consider using nicotine gum or skin patches to help control withdrawal symptoms. Quitting smoking will help your lungs heal faster.   Reduce the chances of another bout of acute bronchitis by washing your hands frequently, avoiding people with cold symptoms, and trying not to touch your hands to your mouth, nose, or eyes.   Keep all follow-up visits as directed by your health care provider.  SEEK MEDICAL CARE IF: Your symptoms do not improve after 1 week of treatment.  SEEK IMMEDIATE MEDICAL CARE IF:  You develop an increased fever or chills.   You have chest pain.   You have severe shortness of breath.  You have bloody sputum.   You develop dehydration.  You faint or repeatedly feel like you are going to pass out.  You develop repeated vomiting.  You develop a severe headache. MAKE SURE YOU:   Understand these instructions.  Will watch your condition.  Will get help right away if you are not doing well or get worse. Document Released: 11/02/2004 Document Revised: 02/09/2014 Document Reviewed: 03/18/2013 [facility] Patient Information 2015 [place], [place]. This information is not intended to replace advice given to you by your health care  provider. Make sure you discuss any questions you have with your health care provider. ° °

## 2014-12-20 NOTE — ED Notes (Signed)
C/o  Non productive cough.  Headache.  Runny nose.  Sneezing.  Body aches.    Denies fever, n/v/d

## 2014-12-24 ENCOUNTER — Telehealth: Payer: Self-pay | Admitting: Emergency Medicine

## 2014-12-25 NOTE — Telephone Encounter (Signed)
INFO ONLY SECOND REQUEST CAME 12/25/2014

## 2014-12-30 NOTE — Telephone Encounter (Signed)
Pharmacy is calling to check on the status of the med refill for the patient, please f/u and ask to speak to [facility].

## 2015-01-03 DIAGNOSIS — M1712 Unilateral primary osteoarthritis, left knee: Secondary | ICD-10-CM | POA: Diagnosis not present

## 2015-01-22 ENCOUNTER — Encounter (HOSPITAL_COMMUNITY): Payer: Self-pay | Admitting: Emergency Medicine

## 2015-01-22 ENCOUNTER — Emergency Department (INDEPENDENT_AMBULATORY_CARE_PROVIDER_SITE_OTHER)
Admission: EM | Admit: 2015-01-22 | Discharge: 2015-01-22 | Disposition: A | Discharge status: Home / Self Care | Payer: Medicare Other | Source: Home / Self Care | Attending: Family Medicine | Admitting: Family Medicine

## 2015-01-22 DIAGNOSIS — S61219A Laceration without foreign body of unspecified finger without damage to nail, initial encounter: Secondary | ICD-10-CM

## 2015-01-22 DIAGNOSIS — Z23 Encounter for immunization: Secondary | ICD-10-CM | POA: Diagnosis not present

## 2015-01-22 MED ORDER — TETANUS-DIPHTH-ACELL PERTUSSIS 5-2.5-18.5 LF-MCG/0.5 IM SUSP
0.5000 mL | Freq: Once | INTRAMUSCULAR | Status: AC
  Administered 2015-01-22: 0.5 mL via INTRAMUSCULAR

## 2015-01-22 MED ORDER — TETANUS-DIPHTH-ACELL PERTUSSIS 5-2.5-18.5 LF-MCG/0.5 IM SUSP
INTRAMUSCULAR | Status: AC
  Filled 2015-01-22: qty 0.5

## 2015-01-22 NOTE — Discharge Instructions (Signed)
Thank you for coming in today.  Laceration Care, Adult A laceration is a cut or lesion that goes through all layers of the skin and into the tissue just beneath the skin. TREATMENT  Some lacerations may not require closure. Some lacerations may not be able to be closed due to an increased risk of infection. It is important to see your caregiver as soon as possible after an injury to minimize the risk of infection and maximize the opportunity for successful closure. If closure is appropriate, pain medicines may be given, if needed. The wound will be cleaned to help prevent infection. Your caregiver will use stitches (sutures), staples, wound glue (adhesive), or skin adhesive strips to repair the laceration. These tools bring the skin edges together to allow for faster healing and a better cosmetic outcome. However, all wounds will heal with a scar. Once the wound has healed, scarring can be minimized by covering the wound with sunscreen during the day for 1 full year. HOME CARE INSTRUCTIONS  For sutures or staples:  Keep the wound clean and dry.  If you were given a bandage (dressing), you should change it at least once a day. Also, change the dressing if it becomes wet or dirty, or as directed by your caregiver.  Wash the wound with soap and water 2 times a day. Rinse the wound off with water to remove all soap. Pat the wound dry with a clean towel.  After cleaning, apply a thin layer of the antibiotic ointment as recommended by your caregiver. This will help prevent infection and keep the dressing from sticking.  You may shower as usual after the first 24 hours. Do not soak the wound in water until the sutures are removed.  Only take over-the-counter or prescription medicines for pain, discomfort, or fever as directed by your caregiver.  Get your sutures or staples removed as directed by your caregiver. For skin adhesive strips:  Keep the wound clean and dry.  Do not get the skin adhesive  strips wet. You may bathe carefully, using caution to keep the wound dry.  If the wound gets wet, pat it dry with a clean towel.  Skin adhesive strips will fall off on their own. You may trim the strips as the wound heals. Do not remove skin adhesive strips that are still stuck to the wound. They will fall off in time. For wound adhesive:  You may briefly wet your wound in the shower or bath. Do not soak or scrub the wound. Do not swim. Avoid periods of heavy perspiration until the skin adhesive has fallen off on its own. After showering or bathing, gently pat the wound dry with a clean towel.  Do not apply liquid medicine, cream medicine, or ointment medicine to your wound while the skin adhesive is in place. This may loosen the film before your wound is healed.  If a dressing is placed over the wound, be careful not to apply tape directly over the skin adhesive. This may cause the adhesive to be pulled off before the wound is healed.  Avoid prolonged exposure to sunlight or tanning lamps while the skin adhesive is in place. Exposure to ultraviolet light in the first year will darken the scar.  The skin adhesive will usually remain in place for 5 to 10 days, then naturally fall off the skin. Do not pick at the adhesive film. You may need a tetanus shot if:  You cannot remember when you had your last tetanus shot.  You have never had a tetanus shot. If you get a tetanus shot, your arm may swell, get red, and feel warm to the touch. This is common and not a problem. If you need a tetanus shot and you choose not to have one, there is a rare chance of getting tetanus. Sickness from tetanus can be serious. SEEK MEDICAL CARE IF:   You have redness, swelling, or increasing pain in the wound.  You see a red line that goes away from the wound.  You have yellowish-white fluid (pus) coming from the wound.  You have a fever.  You notice a bad smell coming from the wound or dressing.  Your  wound breaks open before or after sutures have been removed.  You notice something coming out of the wound such as wood or glass.  Your wound is on your hand or foot and you cannot move a finger or toe. SEEK IMMEDIATE MEDICAL CARE IF:   Your pain is not controlled with prescribed medicine.  You have severe swelling around the wound causing pain and numbness or a change in color in your arm, hand, leg, or foot.  Your wound splits open and starts bleeding.  You have worsening numbness, weakness, or loss of function of any joint around or beyond the wound.  You develop painful lumps near the wound or on the skin anywhere on your body. MAKE SURE YOU:   Understand these instructions.  Will watch your condition.  Will get help right away if you are not doing well or get worse. Document Released: 09/25/2005 Document Revised: 12/18/2011 Document Reviewed: 03/21/2011 [facility] Patient Information 2015 [place], [place]. This information is not intended to replace advice given to you by your health care provider. Make sure you discuss any questions you have with your health care provider.

## 2015-01-22 NOTE — ED Provider Notes (Signed)
Theresa Cline is a 64 y.o. female who presents to Urgent Care today for finger laceration. Patient accidentally stabbed a knife into the left ring finger distal volar pad. She was attempting to cut a hole in a belt. She irrigated her finger at home. She cannot recall her last tetanus vaccine. Her motion is normal. No numbness.   Past Medical History  Diagnosis Date  . S/P carpal tunnel release   . Vertigo   . Tobacco abuse   . Encounter for long-term (current) use of other medications   . Osteoporosis   . Left acoustic neuroma     Hx of  . Diabetic peripheral neuropathy   . Mixed hyperlipidemia   . Primary osteoarthritis of both knees   . Chronic pain syndrome   . Obesity   . Hypertension   . CAD (coronary artery disease)   . Diabetes mellitus type II, controlled   . Bronchitis, acute   . Diabetes type 2, controlled   . Cancer     Renal s/p nephrectomy   Past Surgical History  Procedure Laterality Date  . Resection of left acoustic neuroma  1999  . Coronary angioplasty with stent placement  2004    Normal LV function  . Total abdominal hysterectomy w/ bilateral salpingoophorectomy  04/12/1988    For fibroid tumors; [place], [place]  . Carpal tunnel release  08/09/1998    Right  . Carpal tunnel release  09/08/1998    Left  . Removal lipoma      Right foot  . Cesarean section      x3   History  Substance Use Topics  . Smoking status: Current Every Day Smoker -- 0.50 packs/day for 40 years    Types: Cigarettes  . Smokeless tobacco: Never Used     Comment: smoking 4-5 cigs/day  . Alcohol Use: No   ROS as above Medications: Current Facility-Administered Medications  Medication Dose Route Frequency Provider Last Rate Last Dose  . Tdap (BOOSTRIX) injection 0.5 mL  0.5 mL Intramuscular Once Gregor Hams, MD       Current Outpatient Prescriptions  Medication Sig Dispense Refill  . albuterol (PROVENTIL HFA;VENTOLIN HFA) 108 (90 BASE) MCG/ACT inhaler Inhale 2 puffs  into the lungs every 6 (six) hours as needed for wheezing or shortness of breath. 1 Inhaler 2  . alendronate (FOSAMAX) 70 MG tablet Take 70 mg by mouth once a week. Sunday    . aspirin EC 81 MG tablet Take 81 mg by mouth daily.    Marland Kitchen atorvastatin (LIPITOR) 40 MG tablet Take 1 tablet (40 mg total) by mouth daily. 120 tablet 2  . buPROPion (WELLBUTRIN) 75 MG tablet Take 1 tablet (75 mg total) by mouth 2 (two) times daily. 60 tablet 3  . cephALEXin (KEFLEX) 250 MG capsule Take 1 capsule (250 mg total) by mouth 4 (four) times daily. 20 capsule 0  . Cholecalciferol (VITAMIN D) 2000 UNITS CAPS Take 2,000 Units by mouth daily.    . clonazePAM (KLONOPIN) 0.5 MG tablet Take 0.5 mg by mouth at bedtime.    . diclofenac sodium (VOLTAREN) 1 % GEL Apply 2 g topically 4 (four) times daily as needed (for pain).     . DULoxetine (CYMBALTA) 30 MG capsule TAKE 1 CAPSULE (30 MG TOTAL) BY MOUTH DAILY. 30 capsule 3  . fish oil-omega-3 fatty acids 1000 MG capsule Take 1 g by mouth daily.      . fluticasone (FLONASE) 50 MCG/ACT nasal spray Place 1 spray  into both nostrils daily as needed for allergies or rhinitis.    . hydrALAZINE (APRESOLINE) 50 MG tablet Take 1 tablet (50 mg total) by mouth 3 (three) times daily. 270 tablet 3  . ipratropium (ATROVENT) 0.06 % nasal spray Place 2 sprays into both nostrils 4 (four) times daily. 15 mL 1  . metFORMIN (GLUCOPHAGE) 1000 MG tablet Take 1,000 mg by mouth 2 (two) times daily with a meal.      . metoprolol (TOPROL-XL) 100 MG 24 hr tablet Take 100 mg by mouth daily.      . Multiple Vitamin (MULTIVITAMIN WITH MINERALS) TABS tablet Take 1 tablet by mouth daily. Centrum 50 +    . nitroGLYCERIN (NITROLINGUAL) 0.4 MG/SPRAY spray Place 1 spray under the tongue every 5 (five) minutes x 3 doses as needed for chest pain. 12 g 1  . omeprazole (PRILOSEC) 40 MG capsule Take 1 capsule (40 mg total) by mouth daily. 90 capsule 1  . traMADol (ULTRAM) 50 MG tablet TAKE 2 TABLETS BY MOUTH EVERY 8  HOURS AS NEEDED FOR PAIN 180 tablet 5  . valsartan-hydrochlorothiazide (DIOVAN-HCT) 320-25 MG per tablet Take 1 tablet by mouth at bedtime. 30 tablet 3  . vitamin E 400 UNIT capsule Take 400 Units by mouth daily.     Allergies  Allergen Reactions  . Gabapentin Swelling  . Lyrica [Pregabalin] Swelling  . Adhesive [Tape] Rash     Exam:  BP 163/91 mmHg  Pulse 63  Temp(Src) 97.8 F (36.6 C) (Oral)  SpO2 99% Gen: Well NAD Left hand 1 cm laceration through the dermis at the left ring finger distal pad. No tendon visible. Motion intact Refill intact  Wound repaired with Dermabond  No results found for this or any previous visit (from the past 24 hour(s)). No results found.  Assessment and Plan: 64 y.o. female with finger laceration repaired with Dermabond. Tetanus vaccine provider prior to discharge.  Discussed warning signs or symptoms. Please see discharge instructions. Patient expresses understanding.     Gregor Hams, MD 01/22/15 1340

## 2015-01-22 NOTE — ED Notes (Addendum)
Pt sustained a laceration on her left ring finger from a paring knife she was using to try to punch a hole in her belt.

## 2015-01-25 ENCOUNTER — Telehealth: Payer: Self-pay

## 2015-01-25 NOTE — Telephone Encounter (Signed)
Prescription request faxed back to [facility] For diabetes testing supplies

## 2015-01-26 ENCOUNTER — Other Ambulatory Visit: Payer: Self-pay | Admitting: Physical Medicine & Rehabilitation

## 2015-02-03 DIAGNOSIS — M1712 Unilateral primary osteoarthritis, left knee: Secondary | ICD-10-CM | POA: Diagnosis not present

## 2015-02-16 ENCOUNTER — Ambulatory Visit (INDEPENDENT_AMBULATORY_CARE_PROVIDER_SITE_OTHER): Payer: Medicare Other | Admitting: Cardiovascular Disease

## 2015-02-16 ENCOUNTER — Encounter: Payer: Self-pay | Admitting: Cardiovascular Disease

## 2015-02-16 VITALS — BP 114/64 | HR 70 | Ht 63.0 in | Wt 133.8 lb

## 2015-02-16 DIAGNOSIS — I10 Essential (primary) hypertension: Secondary | ICD-10-CM | POA: Diagnosis not present

## 2015-02-16 DIAGNOSIS — I714 Abdominal aortic aneurysm, without rupture, unspecified: Secondary | ICD-10-CM

## 2015-02-16 DIAGNOSIS — I739 Peripheral vascular disease, unspecified: Secondary | ICD-10-CM

## 2015-02-16 DIAGNOSIS — I2510 Atherosclerotic heart disease of native coronary artery without angina pectoris: Secondary | ICD-10-CM | POA: Diagnosis not present

## 2015-02-16 DIAGNOSIS — I779 Disorder of arteries and arterioles, unspecified: Secondary | ICD-10-CM | POA: Diagnosis not present

## 2015-02-16 NOTE — Patient Instructions (Signed)
Medication Instructions:  Your physician recommends that you continue on your current medications as directed. Please refer to the Current Medication list given to you today.   Labwork: none  Testing/Procedures: none  Follow-Up: Your physician wants you to follow-up in:  12 months.  You will receive a reminder letter in the mail two months in advance. If you don't receive a letter, please call our office to schedule the follow-up appointment.        

## 2015-02-16 NOTE — Progress Notes (Signed)
Chief Complaint  Patient presents with  . Headache    History of Present Illness: 64 yo AAF with h/o CAD s/p MI and stents x 3 in 2004, HTN, Hyperlipidemia, DM type II who presents today for cardiac follow up.  Her stress myoview in 2010 showed no ischemia. She is known to have mild carotid artery disease, last dopplers 2014. 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 I repeated her AAA u/s March 2014 and the AAA is small. I saw her in October 2012 after she had a syncopal event. She told me that she was sitting on the toilet and when she stood up, she passed out. This was following a bowel movement. She felt lightheaded when standing and passed out. She does have vertigo and had an acoustic neuroma resected in 1999. No recurrences after that. She is on Lasix for chronic lower ext edema. Echo February 2014 with normal LV function, no valve issues. She was seen in the ED January 2016 with elevated BP and has had visits here with Truitt Merle, NP in January and February at which time hydralazine was added. Norvasc avoided due to LE swelling. Abd u/s February 2016 with no AAA.   She is here today for follow up. She is doing well overall. She has no chest pain or SOB. Occasional headaches and dizziness related to vertigo. No LE edema.   Primary Care Physician: Lorayne Marek  Last Lipid Profile: Followed in primary care.   Past Medical History  Diagnosis Date  . S/P carpal tunnel release   . Vertigo   . Tobacco abuse   . Encounter for long-term (current) use of other medications   . Osteoporosis   . Left acoustic neuroma     Hx of  . Diabetic peripheral neuropathy   . Mixed hyperlipidemia   . Primary osteoarthritis of both knees   . Chronic pain syndrome   . Obesity   . Hypertension   . CAD (coronary artery disease)   . Diabetes mellitus type II, controlled   . Bronchitis, acute   . Diabetes type 2, controlled   . Cancer     Renal s/p  nephrectomy    Past Surgical History  Procedure Laterality Date  . Resection of left acoustic neuroma  1999  . Coronary angioplasty with stent placement  2004    Normal LV function  . Total abdominal hysterectomy w/ bilateral salpingoophorectomy  04/12/1988    For fibroid tumors; [place], [place]  . Carpal tunnel release  08/09/1998    Right  . Carpal tunnel release  09/08/1998    Left  . Removal lipoma      Right foot  . Cesarean section      x3    Current Outpatient Prescriptions  Medication Sig Dispense Refill  . albuterol (PROVENTIL HFA;VENTOLIN HFA) 108 (90 BASE) MCG/ACT inhaler Inhale 2 puffs into the lungs every 6 (six) hours as needed for wheezing or shortness of breath. 1 Inhaler 2  . alendronate (FOSAMAX) 70 MG tablet Take 70 mg by mouth once a week. Sunday    . aspirin EC 81 MG tablet Take 81 mg by mouth daily.    Marland Kitchen atorvastatin (LIPITOR) 40 MG tablet Take 1 tablet (40 mg total) by mouth daily. 120 tablet 2  . Cholecalciferol (VITAMIN D) 2000 UNITS CAPS Take 2,000 Units by mouth daily.    . clonazePAM (KLONOPIN) 0.5 MG tablet Take 0.5 mg by  mouth at bedtime.    . diclofenac sodium (VOLTAREN) 1 % GEL Apply 2 g topically 4 (four) times daily as needed (for pain).     . DULoxetine (CYMBALTA) 30 MG capsule TAKE 1 CAPSULE (30 MG TOTAL) BY MOUTH DAILY. 30 capsule 3  . fish oil-omega-3 fatty acids 1000 MG capsule Take 1 g by mouth daily.      . fluticasone (FLONASE) 50 MCG/ACT nasal spray Place 1 spray into both nostrils daily as needed for allergies or rhinitis.    . hydrALAZINE (APRESOLINE) 50 MG tablet Take 1 tablet (50 mg total) by mouth 3 (three) times daily. 270 tablet 3  . ipratropium (ATROVENT) 0.06 % nasal spray Place 2 sprays into both nostrils 4 (four) times daily. 15 mL 1  . metFORMIN (GLUCOPHAGE) 1000 MG tablet Take 1,000 mg by mouth 2 (two) times daily with a meal.      . metoprolol (TOPROL-XL) 100 MG 24 hr tablet Take 100 mg by mouth daily.      . Multiple Vitamin  (MULTIVITAMIN WITH MINERALS) TABS tablet Take 1 tablet by mouth daily. Centrum 50 +    . nitroGLYCERIN (NITROLINGUAL) 0.4 MG/SPRAY spray Place 1 spray under the tongue every 5 (five) minutes x 3 doses as needed for chest pain. 12 g 1  . omeprazole (PRILOSEC) 40 MG capsule Take 1 capsule (40 mg total) by mouth daily. 90 capsule 1  . traMADol (ULTRAM) 50 MG tablet TAKE 2 TABLETS BY MOUTH EVERY 8 HOURS AS NEEDED FOR PAIN 180 tablet 5  . valsartan-hydrochlorothiazide (DIOVAN-HCT) 320-25 MG per tablet Take 1 tablet by mouth at bedtime. 30 tablet 3  . vitamin E 400 UNIT capsule Take 400 Units by mouth daily.     No current facility-administered medications for this visit.    Allergies  Allergen Reactions  . Gabapentin Swelling  . Lyrica [Pregabalin] Swelling  . Adhesive [Tape] Rash    History   Social History  . Marital Status: Divorced    Spouse Name: N/A  . Number of Children: N/A  . Years of Education: N/A   Occupational History  . 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 Smoking status: Current Every Day Smoker -- 0.50 packs/day for 40 years    Types: Cigarettes  . Smokeless tobacco: Never Used     Comment: smoking 4-5 cigs/day  . Alcohol Use: No  . Drug Use: No  . Sexual Activity: Not on file   Other Topics Concern  . Not on file   Social History Narrative   Divorced   Disability mainly for back and chronic intermittent vertigo - since tumor removed - no surgical correction for back   Lives alone, keeps her grandson, 68 yr old daughter is a mother    Family History  Problem Relation Age of Onset  . Leukemia Mother     CLL  . Diabetes type II Mother   . Other Mother     Hypercholesterolemia  . Osteoarthritis Mother   . COPD Mother   . Hypertension Mother   . Hodgkin's lymphoma Father   . Diabetes type II Sister   . Osteoporosis Sister   . Paranoid behavior Brother     Unsure if diagnosed with Schizophrenia    . Diabetes type II Sister   . Hypertension Sister   . Diabetes type II Sister   . Hypertension Sister   . Schizophrenia  Sister   . Heart disease Sister     Enlarged heart  . Glaucoma Sister   . Alcohol abuse Sister   . Glaucoma Sister   . Alcohol abuse Sister   . Bipolar disorder Daughter   . Lupus Daughter     SLE    Review of Systems:  As stated in the HPI and otherwise negative.   BP 114/64 mmHg  Pulse 70  Ht 5\' 3"  (1.6 m)  Wt 133 lb 12.8 oz (60.691 kg)  BMI 23.71 kg/m2  Physical Examination: General: Well developed, well nourished, NAD HEENT: OP clear, mucus membranes moist SKIN: warm, dry. No rashes. Neuro: No focal deficits Musculoskeletal: Muscle strength 5/5 all ext Psychiatric: Mood and affect normal Neck: No JVD, no carotid bruits, no thyromegaly, no lymphadenopathy. Lungs:Clear bilaterally, no wheezes, rhonci, crackles Cardiovascular: Regular rate and rhythm. No murmurs, gallops or rubs. Abdomen:Soft. Bowel sounds present. Non-tender.  Extremities: No lower extremity edema. Pulses are 2 + in the bilateral DP/PT.  Echo 11/18/12: Left ventricle: The cavity size was normal. Wall thickness was normal. Systolic function was normal. The estimated ejection fraction was in the range of 55% to 60%. Wall motion was normal; there were no regional wall motion abnormalities. Doppler parameters are consistent with abnormal left ventricular relaxation (grade 1 diastolic dysfunction). - Atrial septum: There was redundancy of the septum, with borderline criteria for aneurysm. - Pulmonary arteries: Systolic pressure was mildly increased. PA peak pressure: 61mm Hg (S).  EKG:  EKG is not ordered today. The ekg ordered today demonstrates   Recent Labs: 09/25/2014: ALT <8; Platelets 237 10/20/2014: BUN 13; Creatinine 1.00; Hemoglobin 14.6; Potassium 3.7; Sodium 137   Lipid Panel    Component Value Date/Time   CHOL 144 09/25/2014 1058   TRIG 71 09/25/2014 1058   HDL  48 09/25/2014 1058   CHOLHDL 3.0 09/25/2014 1058   VLDL 14 09/25/2014 1058   LDLCALC 82 09/25/2014 1058     Wt Readings from Last 3 Encounters:  02/16/15 133 lb 12.8 oz (60.691 kg)  11/20/14 135 lb 3 oz (61.321 kg)  10/27/14 133 lb 1.9 oz (60.383 kg)     Other studies Reviewed: Additional studies/ records that were reviewed today include:  Review of the above records demonstrates:   Assessment and Plan:   1. CAD: Stable. Continue current therapy. She is on an ASA. No changes. Lipids are followed in primary care.   2. Carotid artery disease: Mild disease by dopplers July 2015.     3. AAA: Known history of AAA. Mild by u/s February 2016.     4. HTN: BP controlled.No changes.   Current medicines are reviewed at length with the patient today.  The patient does not have concerns regarding medicines.  The following changes have been made:  no change  Labs/ tests ordered today include:  No orders of the defined types were placed in this encounter.    Disposition:   FU with me  in 1 year  Signed, Lauree Chandler, MD 02/16/2015 11:44 AM    [address], [place], [place]  [address] Phone: [phone]; Fax: [phone]

## 2015-03-04 ENCOUNTER — Telehealth: Payer: Self-pay | Admitting: Cardiovascular Disease

## 2015-03-04 ENCOUNTER — Encounter: Payer: Self-pay | Admitting: *Deleted

## 2015-03-04 ENCOUNTER — Telehealth: Payer: Self-pay | Admitting: Internal Medicine

## 2015-03-04 NOTE — Telephone Encounter (Signed)
Patient is currently Engineer, site, new [place] [phone]

## 2015-03-04 NOTE — Telephone Encounter (Signed)
Spoke with pt and confirmed fax number. I told her I would fax letter stating she was here for office visit on Feb 16, 2015.

## 2015-03-04 NOTE — Telephone Encounter (Signed)
New Message      Pt calling stating that she needs for something to be sent to [facility] stating that she was here for her appt w/ Dr. Angelena Form on May 10th at 8:30 Fax#: [phone]. Pt states she would like this done today. Please call back and advise.

## 2015-03-05 DIAGNOSIS — M1712 Unilateral primary osteoarthritis, left knee: Secondary | ICD-10-CM | POA: Diagnosis not present

## 2015-03-10 ENCOUNTER — Ambulatory Visit: Payer: Medicare Other | Admitting: Internal Medicine

## 2015-03-22 ENCOUNTER — Encounter: Payer: Self-pay | Admitting: Internal Medicine

## 2015-03-22 ENCOUNTER — Ambulatory Visit: Payer: Medicare Other | Attending: Internal Medicine | Admitting: Internal Medicine

## 2015-03-22 ENCOUNTER — Other Ambulatory Visit: Payer: Self-pay | Admitting: Internal Medicine

## 2015-03-22 VITALS — BP 124/80 | HR 83 | Temp 98.2°F | Resp 16 | Wt 129.0 lb

## 2015-03-22 DIAGNOSIS — I10 Essential (primary) hypertension: Secondary | ICD-10-CM | POA: Diagnosis not present

## 2015-03-22 DIAGNOSIS — K219 Gastro-esophageal reflux disease without esophagitis: Secondary | ICD-10-CM | POA: Insufficient documentation

## 2015-03-22 DIAGNOSIS — Z8739 Personal history of other diseases of the musculoskeletal system and connective tissue: Secondary | ICD-10-CM | POA: Insufficient documentation

## 2015-03-22 DIAGNOSIS — Z720 Tobacco use: Secondary | ICD-10-CM | POA: Diagnosis not present

## 2015-03-22 DIAGNOSIS — F17200 Nicotine dependence, unspecified, uncomplicated: Secondary | ICD-10-CM

## 2015-03-22 DIAGNOSIS — E139 Other specified diabetes mellitus without complications: Secondary | ICD-10-CM | POA: Insufficient documentation

## 2015-03-22 LAB — GLUCOSE, POCT (MANUAL RESULT ENTRY): POC Glucose: 115 mg/dl — AB (ref 70–99)

## 2015-03-22 LAB — POCT GLYCOSYLATED HEMOGLOBIN (HGB A1C): Hemoglobin A1C: 5.9

## 2015-03-22 NOTE — Patient Instructions (Signed)
DASH Eating Plan DASH stands for "Dietary Approaches to Stop Hypertension." The DASH eating plan is a healthy eating plan that has been shown to reduce high blood pressure (hypertension). Additional health benefits may include reducing the risk of type 2 diabetes mellitus, heart disease, and stroke. The DASH eating plan may also help with weight loss. WHAT DO I NEED TO KNOW ABOUT THE DASH EATING PLAN? For the DASH eating plan, you will follow these general guidelines:  Choose foods with a percent daily value for sodium of less than 5% (as listed on the food label).  Use salt-free seasonings or herbs instead of table salt or sea salt.  Check with your health care provider or pharmacist before using salt substitutes.  Eat lower-sodium products, often labeled as "lower sodium" or "no salt added."  Eat fresh foods.  Eat more vegetables, fruits, and low-fat dairy products.  Choose whole grains. Look for the word "whole" as the first word in the ingredient list.  Choose fish and skinless chicken or turkey more often than red meat. Limit fish, poultry, and meat to 6 oz (170 g) each day.  Limit sweets, desserts, sugars, and sugary drinks.  Choose heart-healthy fats.  Limit cheese to 1 oz (28 g) per day.  Eat more home-cooked food and less restaurant, buffet, and fast food.  Limit fried foods.  Cook foods using methods other than frying.  Limit canned vegetables. If you do use them, rinse them well to decrease the sodium.  When eating at a restaurant, ask that your food be prepared with less salt, or no salt if possible. WHAT FOODS CAN I EAT? Seek help from a dietitian for individual calorie needs. Grains Whole grain or whole wheat bread. Brown rice. Whole grain or whole wheat pasta. Quinoa, bulgur, and whole grain cereals. Low-sodium cereals. Corn or whole wheat flour tortillas. Whole grain cornbread. Whole grain crackers. Low-sodium crackers. Vegetables Fresh or frozen vegetables  (raw, steamed, roasted, or grilled). Low-sodium or reduced-sodium tomato and vegetable juices. Low-sodium or reduced-sodium tomato sauce and paste. Low-sodium or reduced-sodium canned vegetables.  Fruits All fresh, canned (in natural juice), or frozen fruits. Meat and Other Protein Products Ground beef (85% or leaner), grass-fed beef, or beef trimmed of fat. Skinless chicken or turkey. Ground chicken or turkey. Pork trimmed of fat. All fish and seafood. Eggs. Dried beans, peas, or lentils. Unsalted nuts and seeds. Unsalted canned beans. Dairy Low-fat dairy products, such as skim or 1% milk, 2% or reduced-fat cheeses, low-fat ricotta or cottage cheese, or plain low-fat yogurt. Low-sodium or reduced-sodium cheeses. Fats and Oils Tub margarines without trans fats. Light or reduced-fat mayonnaise and salad dressings (reduced sodium). Avocado. Safflower, olive, or canola oils. Natural peanut or almond butter. Other Unsalted popcorn and pretzels. The items listed above may not be a complete list of recommended foods or beverages. Contact your dietitian for more options. WHAT FOODS ARE NOT RECOMMENDED? Grains White bread. White pasta. White rice. Refined cornbread. Bagels and croissants. Crackers that contain trans fat. Vegetables Creamed or fried vegetables. Vegetables in a cheese sauce. Regular canned vegetables. Regular canned tomato sauce and paste. Regular tomato and vegetable juices. Fruits Dried fruits. Canned fruit in light or heavy syrup. Fruit juice. Meat and Other Protein Products Fatty cuts of meat. Ribs, chicken wings, bacon, sausage, bologna, salami, chitterlings, fatback, hot dogs, bratwurst, and packaged luncheon meats. Salted nuts and seeds. Canned beans with salt. Dairy Whole or 2% milk, cream, half-and-half, and cream cheese. Whole-fat or sweetened yogurt. Full-fat   cheeses or blue cheese. Nondairy creamers and whipped toppings. Processed cheese, cheese spreads, or cheese  curds. Condiments Onion and garlic salt, seasoned salt, table salt, and sea salt. Canned and packaged gravies. Worcestershire sauce. Tartar sauce. Barbecue sauce. Teriyaki sauce. Soy sauce, including reduced sodium. Steak sauce. Fish sauce. Oyster sauce. Cocktail sauce. Horseradish. Ketchup and mustard. Meat flavorings and tenderizers. Bouillon cubes. Hot sauce. Tabasco sauce. Marinades. Taco seasonings. Relishes. Fats and Oils Butter, stick margarine, lard, shortening, ghee, and bacon fat. Coconut, palm kernel, or palm oils. Regular salad dressings. Other Pickles and olives. Salted popcorn and pretzels. The items listed above may not be a complete list of foods and beverages to avoid. Contact your dietitian for more information. WHERE CAN I FIND MORE INFORMATION? National Heart, Lung, and Blood Institute: www.nhlbi.nih.gov/health/health-topics/topics/dash/ Document Released: 09/14/2011 Document Revised: 02/09/2014 Document Reviewed: 07/30/2013 ExitCare Patient Information 2015 [employer]. This information is not intended to replace advice given to you by your health care provider. Make sure you discuss any questions you have with your health care provider. Diabetes Mellitus and Food It is important for you to manage your blood sugar (glucose) level. Your blood glucose level can be greatly affected by what you eat. Eating healthier foods in the appropriate amounts throughout the day at about the same time each day will help you control your blood glucose level. It can also help slow or prevent worsening of your diabetes mellitus. Healthy eating may even help you improve the level of your blood pressure and reach or maintain a healthy weight.  HOW CAN FOOD AFFECT ME? Carbohydrates Carbohydrates affect your blood glucose level more than any other type of food. Your dietitian will help you determine how many carbohydrates to eat at each meal and teach you how to count carbohydrates. Counting  carbohydrates is important to keep your blood glucose at a healthy level, especially if you are using insulin or taking certain medicines for diabetes mellitus. Alcohol Alcohol can cause sudden decreases in blood glucose (hypoglycemia), especially if you use insulin or take certain medicines for diabetes mellitus. Hypoglycemia can be a life-threatening condition. Symptoms of hypoglycemia (sleepiness, dizziness, and disorientation) are similar to symptoms of having too much alcohol.  If your health care provider has given you approval to drink alcohol, do so in moderation and use the following guidelines:  Women should not have more than one drink per day, and men should not have more than two drinks per day. One drink is equal to:  12 oz of beer.  5 oz of wine.  1 oz of hard liquor.  Do not drink on an empty stomach.  Keep yourself hydrated. Have water, diet soda, or unsweetened iced tea.  Regular soda, juice, and other mixers might contain a lot of carbohydrates and should be counted. WHAT FOODS ARE NOT RECOMMENDED? As you make food choices, it is important to remember that all foods are not the same. Some foods have fewer nutrients per serving than other foods, even though they might have the same number of calories or carbohydrates. It is difficult to get your body what it needs when you eat foods with fewer nutrients. Examples of foods that you should avoid that are high in calories and carbohydrates but low in nutrients include:  Trans fats (most processed foods list trans fats on the Nutrition Facts label).  Regular soda.  Juice.  Candy.  Sweets, such as cake, pie, doughnuts, and cookies.  Fried foods. WHAT FOODS CAN I EAT? Have nutrient-rich foods,   which will nourish your body and keep you healthy. The food you should eat also will depend on several factors, including:  The calories you need.  The medicines you take.  Your weight.  Your blood glucose level.  Your  blood pressure level.  Your cholesterol level. You also should eat a variety of foods, including:  Protein, such as meat, poultry, fish, tofu, nuts, and seeds (lean animal proteins are best).  Fruits.  Vegetables.  Dairy products, such as milk, cheese, and yogurt (low fat is best).  Breads, grains, pasta, cereal, rice, and beans.  Fats such as olive oil, trans fat-free margarine, canola oil, avocado, and olives. DOES EVERYONE WITH DIABETES MELLITUS HAVE THE SAME MEAL PLAN? Because every person with diabetes mellitus is different, there is not one meal plan that works for everyone. It is very important that you meet with a dietitian who will help you create a meal plan that is just right for you. Document Released: 06/22/2005 Document Revised: 09/30/2013 Document Reviewed: 08/22/2013 ExitCare Patient Information 2015 [employer]. This information is not intended to replace advice given to you by your health care provider. Make sure you discuss any questions you have with your health care provider.  

## 2015-03-22 NOTE — Progress Notes (Signed)
Patient here for follow up on her diabetes and cholesterol Patient states she does not need any refills at this time

## 2015-03-22 NOTE — Progress Notes (Signed)
MRN: [id number] Name: Theresa Cline  Sex: female Age: 64 y.o. DOB: 10-12-1950  Allergies: Gabapentin; Lyrica; and Adhesive  Chief Complaint  Patient presents with  . Follow-up    HPI: Patient is 63 y.o. female who has history of diabetes hypertension hyperlipidemia, osteoporosis, patient comes today for followup, she reported to have occasionally her blood sugar drops to 70s she does not help any hypoglycemic symptoms, her hemoglobin A1c has dropped to 5.9% currently she is on metformin thousand milligram twice a day, she still smokes cigarettes, I have counseled patient to quit smoking, she was prescribed Wellbutrin as per patient he did not help, she has tried nicotine patch and gum and could not quit.patient also need to repeat DEXA scan currently patient is on Fosamax.  Past Medical History  Diagnosis Date  . S/P carpal tunnel release   . Vertigo   . Tobacco abuse   . Encounter for long-term (current) use of other medications   . Osteoporosis   . Left acoustic neuroma     Hx of  . Diabetic peripheral neuropathy   . Mixed hyperlipidemia   . Primary osteoarthritis of both knees   . Chronic pain syndrome   . Obesity   . Hypertension   . CAD (coronary artery disease)   . Diabetes mellitus type II, controlled   . Bronchitis, acute   . Diabetes type 2, controlled   . Cancer     Renal s/p nephrectomy    Past Surgical History  Procedure Laterality Date  . Resection of left acoustic neuroma  1999  . Coronary angioplasty with stent placement  2004    Normal LV function  . Total abdominal hysterectomy w/ bilateral salpingoophorectomy  04/12/1988    For fibroid tumors; [place], [place]  . Carpal tunnel release  08/09/1998    Right  . Carpal tunnel release  09/08/1998    Left  . Removal lipoma      Right foot  . Cesarean section      x3      Medication List       This list is accurate as of: 03/22/15 10:35 AM.  Always use your most recent med list.               albuterol 108 (90 BASE) MCG/ACT inhaler  Commonly known as:  PROVENTIL HFA;VENTOLIN HFA  Inhale 2 puffs into the lungs every 6 (six) hours as needed for wheezing or shortness of breath.     alendronate 70 MG tablet  Commonly known as:  FOSAMAX  Take 70 mg by mouth once a week. Sunday     aspirin EC 81 MG tablet  Take 81 mg by mouth daily.     atorvastatin 40 MG tablet  Commonly known as:  LIPITOR  Take 1 tablet (40 mg total) by mouth daily.     clonazePAM 0.5 MG tablet  Commonly known as:  KLONOPIN  Take 0.5 mg by mouth at bedtime.     DULoxetine 30 MG capsule  Commonly known as:  CYMBALTA  TAKE 1 CAPSULE (30 MG TOTAL) BY MOUTH DAILY.     fish oil-omega-3 fatty acids 1000 MG capsule  Take 1 g by mouth daily.     fluticasone 50 MCG/ACT nasal spray  Commonly known as:  FLONASE  Place 1 spray into both nostrils daily as needed for allergies or rhinitis.     hydrALAZINE 50 MG tablet  Commonly known as:  APRESOLINE  Take 1 tablet (50  mg total) by mouth 3 (three) times daily.     ipratropium 0.06 % nasal spray  Commonly known as:  ATROVENT  Place 2 sprays into both nostrils 4 (four) times daily.     metFORMIN 1000 MG tablet  Commonly known as:  GLUCOPHAGE  Take 1,000 mg by mouth 2 (two) times daily with a meal.     metoprolol succinate 100 MG 24 hr tablet  Commonly known as:  TOPROL-XL  Take 100 mg by mouth daily.     multivitamin with minerals Tabs tablet  Take 1 tablet by mouth daily. Centrum 50 +     nitroGLYCERIN 0.4 MG/SPRAY spray  Commonly known as:  NITROLINGUAL  Place 1 spray under the tongue every 5 (five) minutes x 3 doses as needed for chest pain.     omeprazole 40 MG capsule  Commonly known as:  PRILOSEC  Take 1 capsule (40 mg total) by mouth daily.     traMADol 50 MG tablet  Commonly known as:  ULTRAM  TAKE 2 TABLETS BY MOUTH EVERY 8 HOURS AS NEEDED FOR PAIN     valsartan-hydrochlorothiazide 320-25 MG per tablet  Commonly known as:   DIOVAN-HCT  Take 1 tablet by mouth at bedtime.     Vitamin D 2000 UNITS Caps  Take 2,000 Units by mouth daily.     vitamin E 400 UNIT capsule  Take 400 Units by mouth daily.     VOLTAREN 1 % Gel  Generic drug:  diclofenac sodium  Apply 2 g topically 4 (four) times daily as needed (for pain).        No orders of the defined types were placed in this encounter.    Immunization History  Administered Date(s) Administered  . Influenza Whole 10/15/2007  . Influenza,inj,Quad PF,36+ Mos 09/25/2014  . Pneumococcal Polysaccharide-23 08/09/2006  . Td 08/09/2006  . Tdap 01/22/2015    Family History  Problem Relation Age of Onset  . Leukemia Mother     CLL  . Diabetes type II Mother   . Other Mother     Hypercholesterolemia  . Osteoarthritis Mother   . COPD Mother   . Hypertension Mother   . Hodgkin's lymphoma Father   . Diabetes type II Sister   . Osteoporosis Sister   . Paranoid behavior Brother     Unsure if diagnosed with Schizophrenia  . Diabetes type II Sister   . Hypertension Sister   . Diabetes type II Sister   . Hypertension Sister   . Schizophrenia Sister   . Heart disease Sister     Enlarged heart  . Glaucoma Sister   . Alcohol abuse Sister   . Glaucoma Sister   . Alcohol abuse Sister   . Bipolar disorder Daughter   . Lupus Daughter     SLE    History  Substance Use Topics  . Smoking status: Current Every Day Smoker -- 0.50 packs/day for 40 years    Types: Cigarettes  . Smokeless tobacco: Never Used     Comment: smoking 4-5 cigs/day  . Alcohol Use: No    Review of Systems   As noted in HPI  Filed Vitals:   03/22/15 1011  BP: 124/80  Pulse: 83  Temp: 98.2 F (36.8 C)  Resp: 16    Physical Exam  Physical Exam  Constitutional: No distress.  Eyes: EOM are normal. Pupils are equal, round, and reactive to light.  Cardiovascular: Normal rate and regular rhythm.   Pulmonary/Chest: Breath sounds normal.  No respiratory distress. She has no  wheezes. She has no rales.  Musculoskeletal: She exhibits no edema.    CBC    Component Value Date/Time   WBC 7.7 09/25/2014 1058   RBC 4.29 09/25/2014 1058   HGB 14.6 10/20/2014 2002   HCT 43.0 10/20/2014 2002   PLT 237 09/25/2014 1058   MCV 86.9 09/25/2014 1058   LYMPHSABS 1.6 09/25/2014 1058   MONOABS 0.8 09/25/2014 1058   EOSABS 0.2 09/25/2014 1058   BASOSABS 0.0 09/25/2014 1058    CMP     Component Value Date/Time   NA 137 10/20/2014 2002   K 3.7 10/20/2014 2002   CL 98 10/20/2014 2002   CO2 30 09/25/2014 1058   GLUCOSE 93 10/20/2014 2002   BUN 13 10/20/2014 2002   CREATININE 1.00 10/20/2014 2002   CREATININE 0.94 09/25/2014 1058   CALCIUM 9.9 09/25/2014 1058   PROT 7.0 09/25/2014 1058   ALBUMIN 3.9 09/25/2014 1058   AST 10 09/25/2014 1058   ALT <8 09/25/2014 1058   ALKPHOS 56 09/25/2014 1058   BILITOT 0.5 09/25/2014 1058   GFRNONAA 64 09/25/2014 1058   GFRNONAA 67* 09/28/2013 0521   GFRAA 74 09/25/2014 1058   GFRAA 78* 09/28/2013 0521    Lab Results  Component Value Date/Time   CHOL 144 09/25/2014 10:58 AM    Lab Results  Component Value Date/Time   HGBA1C 5.90 03/22/2015 10:08 AM   HGBA1C 6.2 11/06/2008 03:38 PM    Lab Results  Component Value Date/Time   AST 10 09/25/2014 10:58 AM    Assessment and Plan  Other specified diabetes mellitus without complications - Plan:  Results for orders placed or performed in visit on 03/22/15  Glucose (CBG)  Result Value Ref Range   POC Glucose 115.0 (A) 70 - 99 mg/dl  HgB A1c  Result Value Ref Range   Hemoglobin A1C 5.90    Hemoglobin A1c has trended down, she does report occasional blood sugar dropping to 70s, I have advised patient to reduce the dose of metformin to 500 mg twice a day, repeat A1c on the following, will check , Lipid panel, currently patient is on Lipitor.  Smoking Counseled patient to quit smoking  Gastroesophageal reflux disease, esophagitis presence not specified Advised  patient for Loestrin modification, continue with Prilosec  Essential hypertension - Plan:  Blood pressure is well controlled continue with current meds, repeat COMPLETE METABOLIC PANEL WITH GFR  History of osteoporosis - Plan:currently patient is on Fosamax will repeat  DG Bone Density    Return in about 4 months (around 07/22/2015) for hypertension, diabetes.   This note has been created with Surveyor, quantity. Any transcriptional errors are unintentional.    Lorayne Marek, MD

## 2015-03-23 ENCOUNTER — Telehealth: Payer: Self-pay

## 2015-03-23 LAB — COMPLETE METABOLIC PANEL WITH GFR
ALK PHOS: 43 U/L (ref 39–117)
ALT: <8 U/L (ref 0–35)
AST: 13 U/L (ref 0–37)
Albumin: 4.6 g/dL (ref 3.5–5.2)
BUN: 21 mg/dL (ref 6–23)
CALCIUM: 10.5 mg/dL (ref 8.4–10.5)
CO2: 30 mEq/L (ref 19–32)
Chloride: 102 mEq/L (ref 96–112)
Creat: 1.15 mg/dL — ABNORMAL HIGH (ref 0.50–1.10)
GFR, EST NON AFRICAN AMERICAN: 50 mL/min — AB
GFR, Est African American: 57 mL/min — ABNORMAL LOW
GLUCOSE: 100 mg/dL — AB (ref 70–99)
Potassium: 4.8 mEq/L (ref 3.5–5.3)
Sodium: 142 mEq/L (ref 135–145)
Total Bilirubin: 0.3 mg/dL (ref 0.2–1.2)
Total Protein: 7.6 g/dL (ref 6.0–8.3)

## 2015-03-23 LAB — LIPID PANEL
Cholesterol: 148 mg/dL (ref 0–200)
HDL: 59 mg/dL (ref >=46)
LDL CALC: 74 mg/dL (ref 0–99)
Total CHOL/HDL Ratio: 2.5 Ratio
Triglycerides: 76 mg/dL (ref <150)
VLDL: 15 mg/dL (ref 0–40)

## 2015-03-23 NOTE — Telephone Encounter (Signed)
-----   Message from Lorayne Marek, MD sent at 03/23/2015  9:20 AM EDT ----- Blood work reviewed, call and let the patient know that her creatinine is borderline elevated, advise patient to drink plenty of water to prevent dehydration, also avoid taking any over-the-counter NSAIDs including Aleve, ibuprofen, will repeat blood chemistry on the following visit.

## 2015-03-23 NOTE — Telephone Encounter (Signed)
Patient not available Left message on voice mail to return our call 

## 2015-03-24 ENCOUNTER — Other Ambulatory Visit (HOSPITAL_COMMUNITY): Payer: Self-pay | Admitting: Urology

## 2015-03-24 ENCOUNTER — Ambulatory Visit (HOSPITAL_COMMUNITY)
Admission: RE | Admit: 2015-03-24 | Discharge: 2015-03-24 | Disposition: A | Discharge status: Home / Self Care | Payer: Medicare Other | Source: Ambulatory Visit | Attending: Urology | Admitting: Urology

## 2015-03-24 DIAGNOSIS — C649 Malignant neoplasm of unspecified kidney, except renal pelvis: Secondary | ICD-10-CM

## 2015-04-05 DIAGNOSIS — M1712 Unilateral primary osteoarthritis, left knee: Secondary | ICD-10-CM | POA: Diagnosis not present

## 2015-04-08 ENCOUNTER — Telehealth: Payer: Self-pay | Admitting: Internal Medicine

## 2015-04-08 NOTE — Telephone Encounter (Signed)
Patient called to request a med refill for valsartan-hydrochlorothiazide, patient uses [facility] on [address], please f/u

## 2015-04-09 ENCOUNTER — Telehealth: Payer: Self-pay

## 2015-04-09 DIAGNOSIS — I10 Essential (primary) hypertension: Secondary | ICD-10-CM

## 2015-04-09 MED ORDER — VALSARTAN-HYDROCHLOROTHIAZIDE 320-25 MG PO TABS
1.0000 | ORAL_TABLET | Freq: Every day | ORAL | Status: DC
Start: 2015-04-09 — End: 2015-07-05

## 2015-04-09 NOTE — Telephone Encounter (Signed)
Patient called requesting a refill on her blood pressure medication [facility]

## 2015-04-22 ENCOUNTER — Telehealth: Payer: Self-pay

## 2015-04-22 NOTE — Telephone Encounter (Signed)
Spoke with danielle from pharmacy Called to verify medications patient is currently taking

## 2015-05-04 ENCOUNTER — Other Ambulatory Visit: Payer: Self-pay | Admitting: Physical Medicine & Rehabilitation

## 2015-05-05 DIAGNOSIS — M1712 Unilateral primary osteoarthritis, left knee: Secondary | ICD-10-CM | POA: Diagnosis not present

## 2015-05-06 ENCOUNTER — Encounter: Payer: Self-pay | Admitting: Physical Medicine & Rehabilitation

## 2015-05-06 ENCOUNTER — Encounter: Payer: Medicare Other | Attending: Physical Medicine & Rehabilitation

## 2015-05-06 ENCOUNTER — Ambulatory Visit (HOSPITAL_BASED_OUTPATIENT_CLINIC_OR_DEPARTMENT_OTHER): Payer: Medicare Other | Admitting: Physical Medicine & Rehabilitation

## 2015-05-06 VITALS — BP 144/84 | HR 64 | Resp 16

## 2015-05-06 DIAGNOSIS — M4806 Spinal stenosis, lumbar region: Secondary | ICD-10-CM | POA: Diagnosis not present

## 2015-05-06 DIAGNOSIS — I10 Essential (primary) hypertension: Secondary | ICD-10-CM | POA: Insufficient documentation

## 2015-05-06 DIAGNOSIS — M48061 Spinal stenosis, lumbar region without neurogenic claudication: Secondary | ICD-10-CM

## 2015-05-06 DIAGNOSIS — E1140 Type 2 diabetes mellitus with diabetic neuropathy, unspecified: Secondary | ICD-10-CM | POA: Insufficient documentation

## 2015-05-06 DIAGNOSIS — E1142 Type 2 diabetes mellitus with diabetic polyneuropathy: Secondary | ICD-10-CM

## 2015-05-06 DIAGNOSIS — M545 Low back pain: Secondary | ICD-10-CM | POA: Diagnosis present

## 2015-05-06 DIAGNOSIS — G8929 Other chronic pain: Secondary | ICD-10-CM | POA: Diagnosis present

## 2015-05-06 NOTE — Progress Notes (Signed)
Subjective:    Patient ID: Theresa Cline, female    DOB: [birth year], 64 y.o.   MRN: [id number] Chief complaint is chronic low back pain HPI -year-old female with history lumbar spinal stenosis.Her pain has been improving with weight loss. She has a history of diabetes. She has lost 92 pounds in the last 5 years. Hemoglobin A1c is down to 5.9 diabetic control is good, blood pressure control is good  She has good relief with tramadol 2 tablets twice a day.. We discussed serotonin syndrome since she is also taking Cymbalta 30 mg a day. She has no signs or symptoms but we discussed if she increased her doses of either Cymbalta or tramadol she may run into this. Pain Inventory Average Pain 0 Pain Right Now 0 My pain is no pain  In the last 24 hours, has pain interfered with the following? General activity 0 Relation with others 0 Enjoyment of life 0 What TIME of day is your pain at its worst? no pain Sleep (in general) Poor  Pain is worse with: no pain Pain improves with: no pain Relief from Meds: 8  Mobility use a cane ability to climb steps?  no do you drive?  no  Function retired  Neuro/Psych weakness spasms dizziness loss of taste or smell  Prior Studies Any changes since last visit?  no  Physicians involved in your care Any changes since last visit?  no   Family History  Problem Relation Age of Onset  . Leukemia Mother     CLL  . Diabetes type II Mother   . Other Mother     Hypercholesterolemia  . Osteoarthritis Mother   . COPD Mother   . Hypertension Mother   . Hodgkin's lymphoma Father   . Diabetes type II Sister   . Osteoporosis Sister   . Paranoid behavior Brother     Unsure if diagnosed with Schizophrenia  . Diabetes type II Sister   . Hypertension Sister   . Diabetes type II Sister   . Hypertension Sister   . Schizophrenia Sister   . Heart disease Sister     Enlarged heart  . Glaucoma Sister   . Alcohol abuse Sister   . Glaucoma  Sister   . Alcohol abuse Sister   . Bipolar disorder Daughter   . Lupus Daughter     SLE   History   Social History  . Marital Status: Divorced    Spouse Name: N/A  . Number of Children: N/A  . Years of Education: N/A   Occupational History  . 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 Smoking status: Current Every Day Smoker -- 0.50 packs/day for 40 years    Types: Cigarettes  . Smokeless tobacco: Never Used     Comment: smoking 4-5 cigs/day  . Alcohol Use: No  . Drug Use: No  . Sexual Activity: Not on file   Other Topics Concern  . None   Social History Narrative   Divorced   Disability mainly for back and chronic intermittent vertigo - since tumor removed - no surgical correction for back   Lives alone, keeps her grandson, 53 yr old daughter is a mother   Past Surgical History  Procedure Laterality Date  . Resection of left acoustic neuroma  1999  . Coronary angioplasty with stent placement  2004    Normal LV function  . Total  abdominal hysterectomy w/ bilateral salpingoophorectomy  04/12/1988    For fibroid tumors; [place], [place]  . Carpal tunnel release  08/09/1998    Right  . Carpal tunnel release  09/08/1998    Left  . Removal lipoma      Right foot  . Cesarean section      x3   Past Medical History  Diagnosis Date  . S/P carpal tunnel release   . Vertigo   . Tobacco abuse   . Encounter for long-term (current) use of other medications   . Osteoporosis   . Left acoustic neuroma     Hx of  . Diabetic peripheral neuropathy   . Mixed hyperlipidemia   . Primary osteoarthritis of both knees   . Chronic pain syndrome   . Obesity   . Hypertension   . CAD (coronary artery disease)   . Diabetes mellitus type II, controlled   . Bronchitis, acute   . Diabetes type 2, controlled   . Cancer     Renal s/p nephrectomy   BP 144/84 mmHg  Pulse 64  Resp 16  SpO2 96%  Opioid Risk Score:   Fall Risk  Score:  `1  Depression screen PHQ 2/9  Depression screen [facility] 2/9 05/06/2015 09/25/2014  Decreased Interest 0 0  Down, Depressed, Hopeless 0 0  PHQ - 2 Score 0 0  Altered sleeping 3 -  Tired, decreased energy 0 -  Change in appetite 3 -  Feeling bad or failure about yourself  0 -  Trouble concentrating 2 -  Moving slowly or fidgety/restless 0 -  Suicidal thoughts 0 -  PHQ-9 Score 8 -     Review of Systems  Constitutional: Positive for unexpected weight change.       Loss of taste or smell  Cardiovascular: Positive for leg swelling.  Musculoskeletal:       Spasms  Neurological: Positive for dizziness and weakness.  All other systems reviewed and are negative.      Objective:   Physical Exam  Constitutional: She is oriented to person, place, and time. She appears well-developed and well-nourished.  HENT:  Head: Normocephalic and atraumatic.  Eyes: Conjunctivae and EOM are normal. Pupils are equal, round, and reactive to light.  Neck: Normal range of motion.  Neurological: She is alert and oriented to person, place, and time. She has normal strength.  Tingling in feet  But intact sensation No allodynia  Psychiatric: She has a normal mood and affect.  Nursing note and vitals reviewed.  Feet have no evidence of skin breakdown. This is old healed scar at the right ankle Ambulates without evidence of toe drag or knee instability      Assessment & Plan:  1. Lumbar spinal stenosis much improved in terms of her mobility since losing 92 pounds. She no longer requires hospital bed. Her pain is well controlled on tramadol 100 mg twice a day.She is also on Cymbalta 30 mg per day She is increasing her activity level. We discussed her exercise program which consists of theraband exercises about a half hour day as well as walking 30 minutes 3 times per week. We discussed that if she wishes to increase her exercises she should not increase her duration by greater than 10% per week  We  also discussed serotonin syndrome and have given some information about this.  We discussed smoking cessation  2. Diabetes well controlled with neuropathy. The neuropathy symptoms are well controlled by the tramadol and I imagine the  Cymbalta as well   3. Hypertension controlled primary care is following this.

## 2015-05-06 NOTE — Patient Instructions (Signed)
Serotonin Syndrome Serotonin is a brain chemical that regulates the nervous system. Some kinds of drugs increase the amount of serotonin in your body. Drugs that increase the serotonin in your body include:   Anti-depressant medications.  St. John's wort.  Recreational drugs.  Migraine medicines.  Some pain medicines. SYMPTOMS Combining these drugs increases the risk that you will become ill with a toxic condition called serotonin syndrome.  Symptoms of too much serotonin include:  Confusion.  Agitation.  Weakness.  Insomnia.  Fever.  Sweats. Other symptoms that may develop include:  Shakiness.  Muscle spasms.  Seizures. TREATMENT  Hospital treatment is often needed until the effects are controlled.  Avoiding the combination of medicines listed above is recommended.  Check with your doctor if you are concerned about your medicine or the side effects. Document Released: 11/02/2004 Document Revised: 12/18/2011 Document Reviewed: 09/25/2005 [facility] Patient Information 2015 [place], [place]. This information is not intended to replace advice given to you by your health care provider. Make sure you discuss any questions you have with your health care provider.

## 2015-05-17 ENCOUNTER — Other Ambulatory Visit: Payer: Self-pay | Admitting: Physical Medicine & Rehabilitation

## 2015-06-05 DIAGNOSIS — M1712 Unilateral primary osteoarthritis, left knee: Secondary | ICD-10-CM | POA: Diagnosis not present

## 2015-06-09 ENCOUNTER — Other Ambulatory Visit: Payer: Self-pay | Admitting: Physical Medicine & Rehabilitation

## 2015-06-11 ENCOUNTER — Other Ambulatory Visit: Payer: Self-pay | Admitting: Family Medicine

## 2015-06-11 MED ORDER — METFORMIN HCL 1000 MG PO TABS: 1000.0000 mg | ORAL_TABLET | Freq: Two times a day (BID) | ORAL | Status: DC

## 2015-06-29 ENCOUNTER — Other Ambulatory Visit: Payer: Self-pay | Admitting: Internal Medicine

## 2015-06-29 ENCOUNTER — Other Ambulatory Visit: Payer: Self-pay

## 2015-06-29 DIAGNOSIS — K219 Gastro-esophageal reflux disease without esophagitis: Secondary | ICD-10-CM

## 2015-06-29 MED ORDER — IPRATROPIUM BROMIDE 0.06 % NA SOLN: 2.0000 | Freq: Four times a day (QID) | NASAL | Status: DC

## 2015-06-29 MED ORDER — ALBUTEROL SULFATE HFA 108 (90 BASE) MCG/ACT IN AERS: 2.0000 | INHALATION_SPRAY | Freq: Four times a day (QID) | RESPIRATORY_TRACT | Status: DC | PRN

## 2015-06-29 MED ORDER — OMEPRAZOLE 40 MG PO CPDR: 40.0000 mg | DELAYED_RELEASE_CAPSULE | Freq: Every day | ORAL | Status: DC

## 2015-07-05 ENCOUNTER — Encounter: Payer: Self-pay | Admitting: Family Medicine

## 2015-07-05 ENCOUNTER — Other Ambulatory Visit: Payer: Self-pay | Admitting: *Deleted

## 2015-07-05 DIAGNOSIS — I10 Essential (primary) hypertension: Secondary | ICD-10-CM

## 2015-07-05 MED ORDER — VALSARTAN-HYDROCHLOROTHIAZIDE 320-25 MG PO TABS: 1.0000 | ORAL_TABLET | Freq: Every day | ORAL | Status: DC

## 2015-07-05 MED ORDER — ATORVASTATIN CALCIUM 40 MG PO TABS: 40.0000 mg | ORAL_TABLET | Freq: Every day | ORAL | Status: DC

## 2015-07-05 NOTE — Progress Notes (Signed)
Received medlist from patient's pharmacy Med list reviewed against patient's EPIC med list Removed clonazepam and Flonase as these are historical meds that have not been refilled in EPIC as not on patient's pharmacy list. Left OTC meds on the list Left metformin on the list as this was refilled recently.

## 2015-07-06 DIAGNOSIS — M1712 Unilateral primary osteoarthritis, left knee: Secondary | ICD-10-CM | POA: Diagnosis not present

## 2015-07-26 ENCOUNTER — Other Ambulatory Visit: Payer: Self-pay

## 2015-07-26 ENCOUNTER — Other Ambulatory Visit: Payer: Self-pay | Admitting: Internal Medicine

## 2015-07-26 DIAGNOSIS — Z1231 Encounter for screening mammogram for malignant neoplasm of breast: Secondary | ICD-10-CM

## 2015-08-05 DIAGNOSIS — M1712 Unilateral primary osteoarthritis, left knee: Secondary | ICD-10-CM | POA: Diagnosis not present

## 2015-08-06 ENCOUNTER — Ambulatory Visit
Admission: RE | Admit: 2015-08-06 | Discharge: 2015-08-06 | Disposition: A | Discharge status: Home / Self Care | Payer: Medicare Other | Source: Ambulatory Visit

## 2015-08-06 DIAGNOSIS — Z1231 Encounter for screening mammogram for malignant neoplasm of breast: Secondary | ICD-10-CM | POA: Diagnosis not present

## 2015-08-10 ENCOUNTER — Other Ambulatory Visit: Payer: Self-pay | Admitting: Internal Medicine

## 2015-08-10 DIAGNOSIS — R928 Other abnormal and inconclusive findings on diagnostic imaging of breast: Secondary | ICD-10-CM

## 2015-08-17 ENCOUNTER — Other Ambulatory Visit: Payer: Self-pay | Admitting: Internal Medicine

## 2015-08-17 ENCOUNTER — Ambulatory Visit
Admission: RE | Admit: 2015-08-17 | Discharge: 2015-08-17 | Disposition: A | Discharge status: Home / Self Care | Payer: Medicare Other | Source: Ambulatory Visit | Attending: Internal Medicine | Admitting: Internal Medicine

## 2015-08-17 DIAGNOSIS — R928 Other abnormal and inconclusive findings on diagnostic imaging of breast: Secondary | ICD-10-CM

## 2015-08-17 DIAGNOSIS — R921 Mammographic calcification found on diagnostic imaging of breast: Secondary | ICD-10-CM | POA: Diagnosis not present

## 2015-08-20 ENCOUNTER — Ambulatory Visit
Admission: RE | Admit: 2015-08-20 | Discharge: 2015-08-20 | Disposition: A | Discharge status: Home / Self Care | Payer: Medicare Other | Source: Ambulatory Visit | Attending: Internal Medicine | Admitting: Internal Medicine

## 2015-08-20 ENCOUNTER — Other Ambulatory Visit: Payer: Self-pay | Admitting: Internal Medicine

## 2015-08-20 DIAGNOSIS — R921 Mammographic calcification found on diagnostic imaging of breast: Secondary | ICD-10-CM

## 2015-08-20 DIAGNOSIS — D242 Benign neoplasm of left breast: Secondary | ICD-10-CM | POA: Diagnosis not present

## 2015-08-20 DIAGNOSIS — R928 Other abnormal and inconclusive findings on diagnostic imaging of breast: Secondary | ICD-10-CM

## 2015-09-05 DIAGNOSIS — M1712 Unilateral primary osteoarthritis, left knee: Secondary | ICD-10-CM | POA: Diagnosis not present

## 2015-09-10 ENCOUNTER — Ambulatory Visit: Payer: Self-pay | Admitting: Surgery

## 2015-09-10 DIAGNOSIS — N6092 Unspecified benign mammary dysplasia of left breast: Secondary | ICD-10-CM

## 2015-09-10 NOTE — H&P (Signed)
[name] 09/10/2015 9:18 AM Location: [facility] Patient #: [id number] DOB: July 03, 1951 Divorced / Language: Cleophus Molt / Race: Black or African American Female  History of Present Illness Marcello Moores A. Oliviagrace Crisanti MD; 09/10/2015 10:29 AM) Patient words: l br ADH  Patient said at the request of Dr. Curlene Dolphin for a left breast atypical ductal hyperplasia was diagnosed by her recent screening mammogram and core biopsy. Patient denies any problems with either breast. Patient denies any history of breast mass, nipple discharge or change in the appearance of her breasts. The patient has 2 first-degree relatives with breast cancer.              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 - SEE COMMENT. CLINICAL DATA: Stereotactic biopsy is performed of calcifications in the lower inner quadrant of the left breast. EXAM: DIAGNOSTIC LEFT MAMMOGRAM POST STEREOTACTIC BIOPSY COMPARISON: Previous exam(s). FINDINGS: Mammographic images were obtained following stereotactic guided biopsy of biopsy of calcifications in the lower inner quadrant of the left breast. X shaped biopsy clip is satisfactorily positioned in the expected location of the calcifications. Biopsy changes are seen in this region. No residual calcifications are identified. IMPRESSION: Satisfactory position of biopsy clip in the left breast. Final Assessment: Post Procedure Mammograms for Marker Placement.  The patient is a 64 year old female.   Other Problems Ventura Sellers, CMA; 09/10/2015 9:18 AM) Anxiety Disorder Arthritis Back Pain Cancer Diabetes Mellitus Gastroesophageal Reflux Disease High blood pressure Hypercholesterolemia Myocardial infarction Sleep Apnea  Past Surgical History Ventura Sellers, CMA; 09/10/2015 9:18 AM) Breast Biopsy Left. Colon Polyp Removal -  Colonoscopy Foot Surgery Right. Hysterectomy (not due to cancer) - Partial Knee Surgery Left. Nephrectomy Right.  Diagnostic Studies History Ventura Sellers, [place]; 09/10/2015 9:18 AM) Colonoscopy 1-5 years ago Mammogram within last year Pap Smear never  Allergies Ventura Sellers, CMA; 09/10/2015 9:19 AM) Gabapentin *ANTICONVULSANTS* Lyrica *ANTICONVULSANTS*  Medication History Ventura Sellers, CMA; 09/10/2015 9:21 AM) TraMADol HCl (50MG  Tablet, Oral) Active. Alendronate Sodium (70MG  Tablet, Oral) Active. Atorvastatin Calcium (40MG  Tablet, Oral) Active. [facility] Vitamin D (2000UNIT Capsule, Oral) Active. HydrALAZINE HCl (50MG  Tablet, Oral) Active. MetFORMIN HCl (1000MG  Tablet, Oral) Active. Metoprolol Succinate ER (100MG  Tablet ER 24HR, Oral) Active. Omeprazole (40MG  Capsule DR, Oral) Active. Valsartan-Hydrochlorothiazide (320-25MG  Tablet, Oral) Active. Aspirin Low Dose (81MG  Tablet DR, Oral) Active. Fish Oil Active. Ipratropium Bromide (0.06% Solution, Nasal) Active. Vitamin E (400UNIT Tablet, Oral) Active. Medications Reconciled  Social History Ventura Sellers, [place]; 09/10/2015 9:18 AM) Alcohol use Remotely quit alcohol use. No caffeine use No drug use Tobacco use Current every day smoker.  Family History Ventura Sellers, [place]; 09/10/2015 9:18 AM) Alcohol Abuse Brother, Sister. Breast Cancer Family Members In General. Depression Sister. Diabetes Mellitus Sister. Heart disease in female family member before age 24 Heart disease in female family member before age 15 Hypertension Sister. Migraine Headache Daughter.  Pregnancy / Birth History Ventura Sellers, [place]; 09/10/2015 9:18 AM) Age at menarche 47 years. Age of menopause <43 Gravida 6 Irregular periods Maternal age 63-20 Para 3     Review of Systems Sharyn Lull R. Brooks CMA; 09/10/2015 9:18 AM) General Present- Chills and Fatigue. Not Present- Appetite Loss, Fever,  Night Sweats, Weight Gain and Weight Loss. Skin Present- Dryness. Not Present- Change in Wart/Mole, Hives, Jaundice, New Lesions, Non-Healing Wounds, Rash and Ulcer. HEENT Present- Hearing Loss, Seasonal Allergies, Sore Throat and Wears glasses/contact lenses. Not Present-  Earache, Hoarseness, Nose Bleed, Oral Ulcers, Ringing in the Ears, Sinus Pain, Visual Disturbances and Yellow Eyes. Respiratory Present- Difficulty Breathing and Wheezing. Not Present- Bloody sputum, Chronic Cough and Snoring. Cardiovascular Present- Shortness of Breath and Swelling of Extremities. Not Present- Chest Pain, Difficulty Breathing Lying Down, Leg Cramps, Palpitations and Rapid Heart Rate. Gastrointestinal Present- Gets full quickly at meals, Nausea, Rectal Pain and Vomiting. Not Present- Abdominal Pain, Bloating, Bloody Stool, Change in Bowel Habits, Chronic diarrhea, Constipation, Difficulty Swallowing, Excessive gas, Hemorrhoids and Indigestion. Female Genitourinary Not Present- Frequency, Nocturia, Painful Urination, Pelvic Pain and Urgency. Musculoskeletal Present- Back Pain, Joint Pain, Joint Stiffness, Muscle Pain, Muscle Weakness and Swelling of Extremities. Neurological Present- Fainting, Numbness, Tingling, Tremor and Trouble walking. Not Present- Decreased Memory, Headaches, Seizures and Weakness. Psychiatric Present- Anxiety. Not Present- Bipolar, Change in Sleep Pattern, Depression, Fearful and Frequent crying. Endocrine Present- Hair Changes and Hot flashes. Not Present- Cold Intolerance, Excessive Hunger, Heat Intolerance and New Diabetes. Hematology Present- Easy Bruising. Not Present- Excessive bleeding, Gland problems, HIV and Persistent Infections.  Vitals [employer] R. Brooks CMA; 09/10/2015 9:18 AM) 09/10/2015 9:18 AM Weight: 135.5 lb Height: 63in Body Surface Area: 1.64 m Body Mass Index: 24 kg/m  Pulse: 66 (Regular)  BP: 130/78 (Sitting, Left Arm, Standard)      Physical Exam  (Toris Laverdiere A. Caryn Gienger MD; 09/10/2015 10:30 AM)  General Mental Status-Alert. General Appearance-Consistent with stated age. Hydration-Well hydrated. Voice-Normal.  Head and Neck Head-normocephalic, atraumatic with no lesions or palpable masses. Trachea-midline. Thyroid Gland Characteristics - normal size and consistency.  Chest and Lung Exam Chest and lung exam reveals -quiet, even and easy respiratory effort with no use of accessory muscles and on auscultation, normal breath sounds, no adventitious sounds and normal vocal resonance. Inspection Chest Wall - Normal. Back - normal.  Breast Breast - Left-Symmetric, Non Tender, No Biopsy scars, no Dimpling, No Inflammation, No Lumpectomy scars, No Mastectomy scars, No Peau d' Orange. Breast - Right-Symmetric, Non Tender, No Biopsy scars, no Dimpling, No Inflammation, No Lumpectomy scars, No Mastectomy scars, No Peau d' Orange. Breast Lump-No Palpable Breast Mass.  Cardiovascular Cardiovascular examination reveals -normal heart sounds, regular rate and rhythm with no murmurs and normal pedal pulses bilaterally.  Neurologic Neurologic evaluation reveals -alert and oriented x 3 with no impairment of recent or remote memory. Mental Status-Normal.  Musculoskeletal Normal Exam - Left-Upper Extremity Strength Normal and Lower Extremity Strength Normal. Normal Exam - Right-Upper Extremity Strength Normal and Lower Extremity Strength Normal.  Lymphatic Head & Neck  General Head & Neck Lymphatics: Bilateral - Description - Normal. Axillary  General Axillary Region: Bilateral - Description - Normal. Tenderness - Non Tender.    Assessment & Plan (Hilda Wexler A. Melea Prezioso MD; 09/10/2015 10:32 AM)  ATYPICAL DUCTAL HYPERPLASIA OF LEFT BREAST (N60.92) Impression: DISCUSSED options of left breast lumpectomy versus observation. Recommend left breast C localized lumpectomy for atypical ductal hyperplasia to exclude  malignancy. Risk of, malignancies 3% to 10% in the setting. Patient requires cardiac clearance. Questions were answered. Risk of lumpectomy include bleeding, infection, seroma, more surgery, use of seed/wire, wound care, cosmetic deformity and the need for other treatments, death , blood clots, death. Pt agrees to proceed.  Current Plans Pt Education - [facility] Breast Biopsy HCI: discussed with patient and provided information. The anatomy and the physiology was discussed. The pathophysiology and natural history of the disease was discussed. Options were discussed and recommendations were made. Technique, risks, benefits, & alternatives were discussed. Risks such as stroke, heart attack,  bleeding, indection, death, and other risks discussed. Questions answered. The patient agrees to proceed.

## 2015-09-15 ENCOUNTER — Telehealth: Payer: Self-pay | Admitting: *Deleted

## 2015-09-15 NOTE — Telephone Encounter (Signed)
Triage nurse at [facility] notified of pt's appt time.

## 2015-09-15 NOTE — Telephone Encounter (Signed)
Received request for surgical clearance from [facility]. Reviewed with Dr. Angelena Form and pt will need office visit prior to being cleared.  I spoke with pt and offered her appt with Dr. Angelena Form this afternoon but she has to arrange transportation and cannot be here today.  The earliest she is available is December 15,2016.  Appt made for her to see Dr. Angelena Form on September 23, 2015 at 12:00.

## 2015-09-20 ENCOUNTER — Encounter: Payer: Self-pay | Admitting: *Deleted

## 2015-09-23 ENCOUNTER — Encounter: Payer: Self-pay | Admitting: Cardiovascular Disease

## 2015-09-23 ENCOUNTER — Ambulatory Visit (INDEPENDENT_AMBULATORY_CARE_PROVIDER_SITE_OTHER): Payer: Medicare Other | Admitting: Cardiovascular Disease

## 2015-09-23 ENCOUNTER — Other Ambulatory Visit: Payer: Self-pay | Admitting: Family Medicine

## 2015-09-23 VITALS — BP 138/80 | HR 76 | Ht 63.0 in | Wt 136.0 lb

## 2015-09-23 DIAGNOSIS — I2510 Atherosclerotic heart disease of native coronary artery without angina pectoris: Secondary | ICD-10-CM | POA: Diagnosis not present

## 2015-09-23 DIAGNOSIS — I739 Peripheral vascular disease, unspecified: Secondary | ICD-10-CM

## 2015-09-23 DIAGNOSIS — I714 Abdominal aortic aneurysm, without rupture, unspecified: Secondary | ICD-10-CM

## 2015-09-23 DIAGNOSIS — I779 Disorder of arteries and arterioles, unspecified: Secondary | ICD-10-CM | POA: Diagnosis not present

## 2015-09-23 DIAGNOSIS — Z01810 Encounter for preprocedural cardiovascular examination: Secondary | ICD-10-CM

## 2015-09-23 DIAGNOSIS — I10 Essential (primary) hypertension: Secondary | ICD-10-CM | POA: Diagnosis not present

## 2015-09-23 MED ORDER — METOPROLOL SUCCINATE ER 100 MG PO TB24: 100.0000 mg | ORAL_TABLET | Freq: Every day | ORAL | Status: DC

## 2015-09-23 MED ORDER — HYDRALAZINE HCL 50 MG PO TABS: 50.0000 mg | ORAL_TABLET | Freq: Three times a day (TID) | ORAL | Status: DC

## 2015-09-23 MED ORDER — VALSARTAN-HYDROCHLOROTHIAZIDE 320-25 MG PO TABS: 1.0000 | ORAL_TABLET | Freq: Every day | ORAL | Status: DC

## 2015-09-23 NOTE — Progress Notes (Signed)
Chief Complaint  Patient presents with  . Follow-up    htn /cad pt c/o sweeling in both legs and ankles    History of Present Illness: 64 yo AAF with h/o CAD s/p MI and stents x 3 in 2004 ([place], [place]), HTN, Hyperlipidemia, DM type II who presents today for cardiac follow up.  Her stress myoview in 2010 showed no ischemia. She is known to have mild carotid artery disease, last dopplers 2014. 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 I repeated her AAA u/s March 2014 and the AAA is small. I saw her in October 2012 after she had a syncopal event. She told me that she was sitting on the toilet and when she stood up, she passed out. This was following a bowel movement. She felt lightheaded when standing and passed out. She does have vertigo and had an acoustic neuroma resected in 1999. No recurrences after that. She is on Lasix for chronic lower ext edema. Echo February 2014 with normal LV function, no valve issues. She was seen in the ED January 2016 with elevated BP and has had visits here with Truitt Merle, NP in January and February at which time hydralazine was added. Norvasc avoided due to LE swelling. Abd u/s February 2016 with no AAA.   She is here today for follow up. She is doing well overall. She has no chest pain or SOB. Trivial LE edema. She has an upcoming breast surgery.   Primary Care Physician: Lorayne Marek  Last Lipid Profile: Followed in primary care.   Past Medical History  Diagnosis Date  . S/P carpal tunnel release   . Vertigo   . Tobacco abuse   . Encounter for long-term (current) use of other medications   . Osteoporosis   . 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 Diabetic peripheral neuropathy ([place])   . Mixed hyperlipidemia   . Primary osteoarthritis of both knees   . Chronic pain syndrome   . Obesity   . Hypertension   . CAD (coronary artery disease)   . Diabetes mellitus type II, controlled ([place])   .  Bronchitis, acute   . Diabetes type 2, controlled ([place])   . Cancer [facility])     Renal s/p nephrectomy    Past Surgical History  Procedure Laterality Date  . Resection of left acoustic neuroma  1999  . Coronary angioplasty with stent placement  2004    Normal LV function  . Total abdominal hysterectomy w/ bilateral salpingoophorectomy  04/12/1988    For fibroid tumors; [place], [place]  . Carpal tunnel release Right 08/09/1998  . Carpal tunnel release Left 09/08/1998  . Lipoma excision Right     Right foot  . Cesarean section      x3    Current Outpatient Prescriptions  Medication Sig Dispense Refill  . albuterol (PROVENTIL HFA;VENTOLIN HFA) 108 (90 BASE) MCG/ACT inhaler Inhale 2 puffs into the lungs every 6 (six) hours as needed for wheezing or shortness of breath. 1 Inhaler 2  . alendronate (FOSAMAX) 70 MG tablet Take 70 mg by mouth once a week. Sunday    . aspirin EC 81 MG tablet Take 81 mg by mouth daily.    Marland Kitchen atorvastatin (LIPITOR) 40 MG tablet Take 1 tablet (40 mg total) by mouth daily. 90 tablet 2  . Cholecalciferol (VITAMIN D) 2000 UNITS CAPS Take 2,000 Units by mouth daily.    Marland Kitchen  fish oil-omega-3 fatty acids 1000 MG capsule Take 1 g by mouth daily.      . hydrALAZINE (APRESOLINE) 50 MG tablet Take 1 tablet (50 mg total) by mouth 3 (three) times daily. 90 tablet 11  . ipratropium (ATROVENT) 0.06 % nasal spray Place 2 sprays into both nostrils 4 (four) times daily. 15 mL 1  . metFORMIN (GLUCOPHAGE) 1000 MG tablet Take 1 tablet (1,000 mg total) by mouth 2 (two) times daily with a meal. (Patient taking differently: Take 500 mg by mouth 2 (two) times daily with a meal. ) 60 tablet 5  . metoprolol succinate (TOPROL-XL) 100 MG 24 hr tablet Take 1 tablet (100 mg total) by mouth daily. 30 tablet 11  . nitroGLYCERIN (NITROLINGUAL) 0.4 MG/SPRAY spray Place 1 spray under the tongue every 5 (five) minutes x 3 doses as needed for chest pain. 12 g 1  . omeprazole (PRILOSEC) 40 MG capsule Take 1  capsule (40 mg total) by mouth daily. 90 capsule 1  . traMADol (ULTRAM) 50 MG tablet TAKE 2 TABLETS BY MOUTH EVERY 8 HOURS AS NEEDED FOR PAIN 180 tablet 5  . valsartan-hydrochlorothiazide (DIOVAN-HCT) 320-25 MG tablet Take 1 tablet by mouth at bedtime. 30 tablet 11  . vitamin E 400 UNIT capsule Take 400 Units by mouth daily.     No current facility-administered medications for this visit.    Allergies  Allergen Reactions  . Adhesive [Tape] Rash  . Gabapentin Swelling  . Lyrica [Pregabalin] Swelling    Social History   Social History  . Marital Status: Divorced    Spouse Name: N/A  . Number of Children: N/A  . Years of Education: N/A   Occupational History  . 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 Smoking status: Current Every Day Smoker -- 0.50 packs/day for 40 years    Types: Cigarettes  . Smokeless tobacco: Never Used     Comment: smoking 4-5 cigs/day  . Alcohol Use: No  . Drug Use: No  . Sexual Activity: Not on file   Other Topics Concern  . Not on file   Social History Narrative   Divorced   Disability mainly for back and chronic intermittent vertigo - since tumor removed - no surgical correction for back   Lives alone, keeps her grandson, 22 yr old daughter is a mother    Family History  Problem Relation Age of Onset  . Leukemia Mother     CLL  . Diabetes type II Mother   . Other Mother     Hypercholesterolemia  . Osteoarthritis Mother   . COPD Mother   . Hypertension Mother   . Hodgkin's lymphoma Father   . Diabetes type II Sister   . Osteoporosis Sister   . Paranoid behavior Brother     Unsure if diagnosed with Schizophrenia  . Diabetes type II Sister   . Hypertension Sister   . Diabetes type II Sister   . Hypertension Sister   . Schizophrenia Sister   . Heart disease Sister     Enlarged heart  . Glaucoma Sister   . Alcohol abuse Sister   . Glaucoma Sister   . Alcohol abuse Sister   .  Bipolar disorder Daughter   . Lupus Daughter     SLE  . Healthy Daughter     Review of Systems:  As stated in the HPI and otherwise negative.  BP 138/80 mmHg  Pulse 76  Ht 5\' 3"  (1.6 m)  Wt 136 lb (61.689 kg)  BMI 24.10 kg/m2  SpO2 98%  Physical Examination: General: Well developed, well nourished, NAD HEENT: OP clear, mucus membranes moist SKIN: warm, dry. No rashes. Neuro: No focal deficits Musculoskeletal: Muscle strength 5/5 all ext Psychiatric: Mood and affect normal Neck: No JVD, no carotid bruits, no thyromegaly, no lymphadenopathy. Lungs:Clear bilaterally, no wheezes, rhonci, crackles Cardiovascular: Regular rate and rhythm. No murmurs, gallops or rubs. Abdomen:Soft. Bowel sounds present. Non-tender.  Extremities: No lower extremity edema. Pulses are 2 + in the bilateral DP/PT.  Echo 11/18/12: Left ventricle: The cavity size was normal. Wall thickness was normal. Systolic function was normal. The estimated ejection fraction was in the range of 55% to 60%. Wall motion was normal; there were no regional wall motion abnormalities. Doppler parameters are consistent with abnormal left ventricular relaxation (grade 1 diastolic dysfunction). - Atrial septum: There was redundancy of the septum, with borderline criteria for aneurysm. - Pulmonary arteries: Systolic pressure was mildly increased. PA peak pressure: 71mm Hg (S).  EKG:  EKG is ordered today. The ekg ordered today demonstrates NSR, rate 76 bpm.   Recent Labs: 09/25/2014: Platelets 237 10/20/2014: Hemoglobin 14.6 03/22/2015: ALT <8; BUN 21; Creat 1.15*; Potassium 4.8; Sodium 142   Lipid Panel    Component Value Date/Time   CHOL 148 03/22/2015 1035   TRIG 76 03/22/2015 1035   HDL 59 03/22/2015 1035   CHOLHDL 2.5 03/22/2015 1035   VLDL 15 03/22/2015 1035   LDLCALC 74 03/22/2015 1035     Wt Readings from Last 3 Encounters:  09/23/15 136 lb (61.689 kg)  03/22/15 129 lb (58.514 kg)  02/16/15 133 lb  12.8 oz (60.691 kg)     Other studies Reviewed: Additional studies/ records that were reviewed today include:  Review of the above records demonstrates:   Assessment and Plan:   1. CAD: Stable. Continue current therapy. She is on an ASA, statin and beta blocker. Will arrange Lexiscan stress myoview given her upcoming surgery and no recent ischemic testing. (see below)  2. Carotid artery disease: Mild disease by dopplers July 2015.     3. AAA: Known history of AAA. Mild by u/s February 2016.     4. HTN: BP controlled.No changes.   5. Pre-operative cardiovascular examination: I will arrange a Lexiscan stress myoview to exclude ischemia given her history of CAD with prior MI in 2006. She had stent placement at that time.   Current medicines are reviewed at length with the patient today.  The patient does not have concerns regarding medicines.  The following changes have been made:  no change  Labs/ tests ordered today include:   Orders Placed This Encounter  Procedures  . Myocardial Perfusion Imaging  . EKG 12-Lead    Disposition:   FU with me  in 1 year  Signed, Lauree Chandler, MD 09/23/2015 1:03 PM    [facility] [address], [place], [place]  [address] Phone: [phone]; Fax: [phone]

## 2015-09-23 NOTE — Patient Instructions (Signed)
Medication Instructions:  The current medical regimen is effective;  continue present plan and medications.  Testing/Procedures: Your physician has requested that you have a lexiscan myoview. For further information please visit [email or web address]. Please follow instruction sheet, as given.  Follow-Up: Follow up in 1 year with Dr. Angelena Form.  You will receive a letter in the mail 2 months before you are due.  Please call us when you receive this letter to schedule your follow up appointment.  If you need a refill on your cardiac medications before your next appointment, please call your pharmacy.  Thank you for choosing [facility]!!

## 2015-09-28 ENCOUNTER — Other Ambulatory Visit: Payer: Self-pay | Admitting: Internal Medicine

## 2015-09-28 ENCOUNTER — Telehealth (HOSPITAL_COMMUNITY): Payer: Self-pay | Admitting: *Deleted

## 2015-09-28 NOTE — Telephone Encounter (Signed)
Left message on voicemail in reference to upcoming appointment scheduled for 10/01/15. Phone number given for a call back so details instructions can be given. Dondi Aime, Ranae Palms

## 2015-09-29 ENCOUNTER — Telehealth (HOSPITAL_COMMUNITY): Payer: Self-pay | Admitting: *Deleted

## 2015-09-29 NOTE — Telephone Encounter (Signed)
Left message on voicemail in reference to upcoming appointment scheduled for 09/30/15. Phone number given for a call back so details instructions can be given. Hubbard Brogan, RN

## 2015-09-30 ENCOUNTER — Telehealth (HOSPITAL_COMMUNITY): Payer: Self-pay | Admitting: *Deleted

## 2015-09-30 NOTE — Telephone Encounter (Signed)
Left message on voicemail in reference to upcoming appointment scheduled for 10/01/15. Phone number given for a call back so details instructions can be given. Maddi Collar, Ranae Palms

## 2015-10-01 ENCOUNTER — Ambulatory Visit (HOSPITAL_COMMUNITY): Payer: Medicare Other | Attending: Cardiology

## 2015-10-01 DIAGNOSIS — I10 Essential (primary) hypertension: Secondary | ICD-10-CM | POA: Insufficient documentation

## 2015-10-01 DIAGNOSIS — Z01810 Encounter for preprocedural cardiovascular examination: Secondary | ICD-10-CM

## 2015-10-01 DIAGNOSIS — I2510 Atherosclerotic heart disease of native coronary artery without angina pectoris: Secondary | ICD-10-CM | POA: Insufficient documentation

## 2015-10-01 DIAGNOSIS — E119 Type 2 diabetes mellitus without complications: Secondary | ICD-10-CM | POA: Insufficient documentation

## 2015-10-01 DIAGNOSIS — R079 Chest pain, unspecified: Secondary | ICD-10-CM | POA: Insufficient documentation

## 2015-10-01 LAB — MYOCARDIAL PERFUSION IMAGING
CHL CUP NUCLEAR SDS: 2
CHL CUP NUCLEAR SRS: 1
CHL CUP NUCLEAR SSS: 3
CSEPPHR: 85 {beats}/min
LHR: 0.2
LV dias vol: 77 mL
LV sys vol: 24 mL
Rest HR: 64 {beats}/min
TID: 1.07

## 2015-10-01 MED ORDER — TECHNETIUM TC 99M SESTAMIBI GENERIC - CARDIOLITE
10.1000 | Freq: Once | INTRAVENOUS | Status: AC | PRN
Start: 2015-10-01 — End: 2015-10-01
  Administered 2015-10-01: 10.1 via INTRAVENOUS

## 2015-10-01 MED ORDER — TECHNETIUM TC 99M SESTAMIBI GENERIC - CARDIOLITE
31.5000 | Freq: Once | INTRAVENOUS | Status: AC | PRN
  Administered 2015-10-01: 32 via INTRAVENOUS

## 2015-10-01 MED ORDER — REGADENOSON 0.4 MG/5ML IV SOLN
0.4000 mg | Freq: Once | INTRAVENOUS | Status: AC
  Administered 2015-10-01: 0.4 mg via INTRAVENOUS

## 2015-10-04 ENCOUNTER — Ambulatory Visit: Payer: Medicare Other | Admitting: Podiatry

## 2015-10-05 NOTE — Telephone Encounter (Signed)
error 

## 2015-10-08 ENCOUNTER — Encounter: Payer: Self-pay | Admitting: Cardiovascular Disease

## 2015-10-10 HISTORY — PX: BREAST LUMPECTOMY: SHX2

## 2015-10-20 ENCOUNTER — Other Ambulatory Visit: Payer: Self-pay | Admitting: Family Medicine

## 2015-10-20 ENCOUNTER — Other Ambulatory Visit: Payer: Self-pay | Admitting: Physical Medicine & Rehabilitation

## 2015-11-02 ENCOUNTER — Other Ambulatory Visit: Payer: Self-pay | Admitting: Surgery

## 2015-11-02 DIAGNOSIS — N6092 Unspecified benign mammary dysplasia of left breast: Secondary | ICD-10-CM

## 2015-11-05 ENCOUNTER — Encounter: Payer: Medicare Other | Attending: Physical Medicine & Rehabilitation

## 2015-11-05 ENCOUNTER — Encounter: Payer: Self-pay | Admitting: Physical Medicine & Rehabilitation

## 2015-11-05 ENCOUNTER — Ambulatory Visit (HOSPITAL_BASED_OUTPATIENT_CLINIC_OR_DEPARTMENT_OTHER): Payer: Medicare Other | Admitting: Physical Medicine & Rehabilitation

## 2015-11-05 VITALS — BP 151/72 | HR 61 | Resp 14

## 2015-11-05 DIAGNOSIS — E782 Mixed hyperlipidemia: Secondary | ICD-10-CM | POA: Insufficient documentation

## 2015-11-05 DIAGNOSIS — Z905 Acquired absence of kidney: Secondary | ICD-10-CM | POA: Insufficient documentation

## 2015-11-05 DIAGNOSIS — M1990 Unspecified osteoarthritis, unspecified site: Secondary | ICD-10-CM | POA: Diagnosis not present

## 2015-11-05 DIAGNOSIS — Z85528 Personal history of other malignant neoplasm of kidney: Secondary | ICD-10-CM | POA: Diagnosis not present

## 2015-11-05 DIAGNOSIS — E669 Obesity, unspecified: Secondary | ICD-10-CM | POA: Insufficient documentation

## 2015-11-05 DIAGNOSIS — I2510 Atherosclerotic heart disease of native coronary artery without angina pectoris: Secondary | ICD-10-CM | POA: Diagnosis not present

## 2015-11-05 DIAGNOSIS — F17210 Nicotine dependence, cigarettes, uncomplicated: Secondary | ICD-10-CM | POA: Insufficient documentation

## 2015-11-05 DIAGNOSIS — M810 Age-related osteoporosis without current pathological fracture: Secondary | ICD-10-CM | POA: Insufficient documentation

## 2015-11-05 DIAGNOSIS — M48061 Spinal stenosis, lumbar region without neurogenic claudication: Secondary | ICD-10-CM

## 2015-11-05 DIAGNOSIS — G629 Polyneuropathy, unspecified: Secondary | ICD-10-CM | POA: Diagnosis not present

## 2015-11-05 DIAGNOSIS — G894 Chronic pain syndrome: Secondary | ICD-10-CM | POA: Diagnosis not present

## 2015-11-05 DIAGNOSIS — M4806 Spinal stenosis, lumbar region: Secondary | ICD-10-CM

## 2015-11-05 DIAGNOSIS — E1142 Type 2 diabetes mellitus with diabetic polyneuropathy: Secondary | ICD-10-CM | POA: Diagnosis not present

## 2015-11-05 DIAGNOSIS — M545 Low back pain: Secondary | ICD-10-CM | POA: Diagnosis not present

## 2015-11-05 MED ORDER — LIDOCAINE 5 % EX OINT: 1.0000 "application " | TOPICAL_OINTMENT | Freq: Three times a day (TID) | CUTANEOUS | Status: DC | PRN

## 2015-11-05 NOTE — Patient Instructions (Signed)
If you cannot get a prescription dose of lidocaine cream you may try the 4% over-the-counter dose on your feet 3 times a day as needed

## 2015-11-05 NOTE — Progress Notes (Signed)
Subjective:    Patient ID: Theresa Cline, female    DOB: Sep 03, 1951, 65 y.o.   MRN: [id number]  HPI CC Low back pain  Patient taking tramadol 100 mg 3 times a day Patient has pain in her back which increases with bending and sitting mainly. She has had good relief in the past with lumbar medial branch blocks. She has not had these for several years. She is not on any oral anticoagulant medications.  She has no pain radiating down to her legs. She does have numbness and burning in the feet. She has a long history of diabetes which is now controlled but for a while was uncontrolled She does not tolerate Lyrica or gabapentin due to swelling. We discussed that because of tramadol use we cannot use Cymbalta as well  We discussed lidocaine cream  Patient also wanting some ideas regarding smoking cessation. She has tried Wellbutrin, could not take Chantix due to other medications, has tried patches as well as nicotine gum Smokes about 6 cigarettes per day   Pain Inventory Average Pain NA Pain Right Now 8 My pain is sharp  In the last 24 hours, has pain interfered with the following? General activity NA Relation with others NA Enjoyment of life NA What TIME of day is your pain at its worst? morning Sleep (in general) Poor  Pain is worse with: bending and sitting Pain improves with: medication Relief from Meds: 7  Mobility use a cane ability to climb steps?  no do you drive?  no  Function not employed: date last employed 03/1998 I need assistance with the following:  household duties and shopping  Neuro/Psych numbness spasms dizziness confusion anxiety loss of taste or smell  Prior Studies Any changes since last visit?  yes  Physicians involved in your care Any changes since last visit?  yes   Family History  Problem Relation Age of Onset  . Leukemia Mother     CLL  . Diabetes type II Mother   . Other Mother     Hypercholesterolemia  . Osteoarthritis  Mother   . COPD Mother   . Hypertension Mother   . Hodgkin's lymphoma Father   . Diabetes type II Sister   . Osteoporosis Sister   . Paranoid behavior Brother     Unsure if diagnosed with Schizophrenia  . Diabetes type II Sister   . Hypertension Sister   . Diabetes type II Sister   . Hypertension Sister   . Schizophrenia Sister   . Heart disease Sister     Enlarged heart  . Glaucoma Sister   . Alcohol abuse Sister   . Glaucoma Sister   . Alcohol abuse Sister   . Bipolar disorder Daughter   . Lupus Daughter     SLE  . Healthy Daughter    Social History   Social History  . Marital Status: Divorced    Spouse Name: N/A  . Number of Children: N/A  . Years of Education: N/A   Occupational History  . 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 Smoking status: Current Every Day Smoker -- 0.50 packs/day for 40 years    Types: Cigarettes  . Smokeless tobacco: Never Used     Comment: smoking 4-5 cigs/day  . Alcohol Use: No  . Drug Use: No  . Sexual Activity: Not Asked   Other Topics Concern  . None  Social History Narrative   Divorced   Disability mainly for back and chronic intermittent vertigo - since tumor removed - no surgical correction for back   Lives alone, keeps her grandson, 58 yr old daughter is a mother   Past Surgical History  Procedure Laterality Date  . Resection of left acoustic neuroma  1999  . Coronary angioplasty with stent placement  2004    Normal LV function  . Total abdominal hysterectomy w/ bilateral salpingoophorectomy  04/12/1988    For fibroid tumors; [place], [place]  . Carpal tunnel release Right 08/09/1998  . Carpal tunnel release Left 09/08/1998  . Lipoma excision Right     Right foot  . Cesarean section      x3   Past Medical History  Diagnosis Date  . S/P carpal tunnel release   . Vertigo   . Tobacco abuse   . Encounter for long-term (current) use of other medications   .  Osteoporosis   . 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 Diabetic peripheral neuropathy ([place])   . Mixed hyperlipidemia   . Primary osteoarthritis of both knees   . Chronic pain syndrome   . Obesity   . Hypertension   . CAD (coronary artery disease)   . Diabetes mellitus type II, controlled ([place])   . Bronchitis, acute   . Diabetes type 2, controlled ([place])   . Cancer [facility])     Renal s/p nephrectomy   BP 151/72 mmHg  Pulse 61  Resp 14  SpO2 95%  Opioid Risk Score:   Fall Risk Score:  `1  Depression screen PHQ 2/9  Depression screen [facility] 2/9 05/06/2015 09/25/2014  Decreased Interest 0 0  Down, Depressed, Hopeless 0 0  PHQ - 2 Score 0 0  Altered sleeping 3 -  Tired, decreased energy 0 -  Change in appetite 3 -  Feeling bad or failure about yourself  0 -  Trouble concentrating 2 -  Moving slowly or fidgety/restless 0 -  Suicidal thoughts 0 -  PHQ-9 Score 8 -     Review of Systems  Constitutional: Positive for appetite change.       Loss of taste or smell  Respiratory: Positive for apnea and shortness of breath.   Cardiovascular:       Limb swelling  Gastrointestinal: Positive for nausea.  Neurological: Positive for dizziness and numbness.       Spasms  Hematological: Bruises/bleeds easily.  Psychiatric/Behavioral: Positive for confusion. The patient is nervous/anxious.   All other systems reviewed and are negative.      Objective:   Physical Exam  Constitutional: She is oriented to person, place, and time. She appears well-developed and well-nourished.  HENT:  Head: Normocephalic and atraumatic.  Eyes: Conjunctivae and EOM are normal. Pupils are equal, round, and reactive to light.  Neurological: She is alert and oriented to person, place, and time. She has normal strength. A sensory deficit is present.  Vibratory sensation diminished bilateral feet. Light touch sensation intact   Nursing note and vitals reviewed.   Patient has decreased lumbar flexion to  50%, lumbar extension is to 75%, flexion is more painful than extension. Negative straight leg raising  No tenderness to palpation over the greater trochanters bilaterally      Assessment & Plan:  1. Lumbar spinal stenosis without signs of radiculopathy or myelopathy. Continue tramadol 100 mg 3 times a day  2. 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 Has had  good results with medial branch blocks in the past.Last injection was about 4 years ago. Flexion is actually more painful for her. If medial branch blocks are not helpful again would consider epidurals  3. Trochanteric bursitis improved  4. Diabetes with neuropathy, trial lidocaine cream, would not recommend Cymbalta due to high tramadol dose

## 2015-11-11 ENCOUNTER — Other Ambulatory Visit: Payer: Self-pay | Admitting: Family Medicine

## 2015-11-15 ENCOUNTER — Other Ambulatory Visit: Payer: Self-pay | Admitting: Family Medicine

## 2015-11-22 ENCOUNTER — Encounter (HOSPITAL_BASED_OUTPATIENT_CLINIC_OR_DEPARTMENT_OTHER): Payer: Self-pay | Admitting: *Deleted

## 2015-11-23 ENCOUNTER — Encounter (HOSPITAL_BASED_OUTPATIENT_CLINIC_OR_DEPARTMENT_OTHER)
Admission: RE | Admit: 2015-11-23 | Discharge: 2015-11-23 | Disposition: A | Discharge status: Home / Self Care | Payer: Medicare Other | Source: Ambulatory Visit | Attending: Surgery | Admitting: Surgery

## 2015-11-23 DIAGNOSIS — N62 Hypertrophy of breast: Secondary | ICD-10-CM | POA: Diagnosis not present

## 2015-11-23 DIAGNOSIS — Z01812 Encounter for preprocedural laboratory examination: Secondary | ICD-10-CM | POA: Diagnosis not present

## 2015-11-23 LAB — COMPREHENSIVE METABOLIC PANEL
ALK PHOS: 64 U/L (ref 38–126)
ALT: 18 U/L (ref 14–54)
ANION GAP: 11 (ref 5–15)
AST: 21 U/L (ref 15–41)
Albumin: 3.2 g/dL — ABNORMAL LOW (ref 3.5–5.0)
BILIRUBIN TOTAL: 0.4 mg/dL (ref 0.3–1.2)
BUN: 12 mg/dL (ref 6–20)
CALCIUM: 9.8 mg/dL (ref 8.9–10.3)
CO2: 29 mmol/L (ref 22–32)
Chloride: 99 mmol/L — ABNORMAL LOW (ref 101–111)
Creatinine, Ser: 0.98 mg/dL (ref 0.44–1.00)
GFR calc non Af Amer: 58 mL/min — ABNORMAL LOW (ref >60)
Glucose, Bld: 164 mg/dL — ABNORMAL HIGH (ref 65–99)
Potassium: 3.7 mmol/L (ref 3.5–5.1)
Sodium: 139 mmol/L (ref 135–145)
TOTAL PROTEIN: 6.8 g/dL (ref 6.5–8.1)

## 2015-11-23 LAB — CBC WITH DIFFERENTIAL/PLATELET
Basophils Absolute: 0 10*3/uL (ref 0.0–0.1)
Basophils Relative: 1 %
EOS ABS: 0.2 10*3/uL (ref 0.0–0.7)
Eosinophils Relative: 2 %
HEMATOCRIT: 31.7 % — AB (ref 36.0–46.0)
HEMOGLOBIN: 9.6 g/dL — AB (ref 12.0–15.0)
LYMPHS ABS: 1.8 10*3/uL (ref 0.7–4.0)
Lymphocytes Relative: 29 %
MCH: 23.5 pg — AB (ref 26.0–34.0)
MCHC: 30.3 g/dL (ref 30.0–36.0)
MCV: 77.7 fL — ABNORMAL LOW (ref 78.0–100.0)
MONO ABS: 0.6 10*3/uL (ref 0.1–1.0)
MONOS PCT: 9 %
NEUTROS ABS: 3.6 10*3/uL (ref 1.7–7.7)
NEUTROS PCT: 59 %
Platelets: 286 10*3/uL (ref 150–400)
RBC: 4.08 MIL/uL (ref 3.87–5.11)
RDW: 15.1 % (ref 11.5–15.5)
WBC: 6.2 10*3/uL (ref 4.0–10.5)

## 2015-11-23 NOTE — Progress Notes (Signed)
Lab reviewed and history by Dr Al Corpus, ok for surgery at center

## 2015-11-25 ENCOUNTER — Ambulatory Visit
Admission: RE | Admit: 2015-11-25 | Discharge: 2015-11-25 | Disposition: A | Discharge status: Home / Self Care | Payer: Medicare Other | Source: Ambulatory Visit | Attending: Surgery | Admitting: Surgery

## 2015-11-25 DIAGNOSIS — N6092 Unspecified benign mammary dysplasia of left breast: Secondary | ICD-10-CM

## 2015-11-25 DIAGNOSIS — R921 Mammographic calcification found on diagnostic imaging of breast: Secondary | ICD-10-CM | POA: Diagnosis not present

## 2015-11-26 ENCOUNTER — Encounter: Payer: Medicare Other | Attending: Physical Medicine & Rehabilitation

## 2015-11-26 ENCOUNTER — Ambulatory Visit (HOSPITAL_BASED_OUTPATIENT_CLINIC_OR_DEPARTMENT_OTHER): Payer: Medicare Other | Admitting: Physical Medicine & Rehabilitation

## 2015-11-26 ENCOUNTER — Encounter: Payer: Self-pay | Admitting: Physical Medicine & Rehabilitation

## 2015-11-26 VITALS — BP 137/66 | HR 74 | Resp 14

## 2015-11-26 DIAGNOSIS — M545 Low back pain: Secondary | ICD-10-CM | POA: Insufficient documentation

## 2015-11-26 DIAGNOSIS — Z905 Acquired absence of kidney: Secondary | ICD-10-CM | POA: Diagnosis not present

## 2015-11-26 DIAGNOSIS — M1990 Unspecified osteoarthritis, unspecified site: Secondary | ICD-10-CM | POA: Diagnosis not present

## 2015-11-26 DIAGNOSIS — M4806 Spinal stenosis, lumbar region: Secondary | ICD-10-CM | POA: Diagnosis not present

## 2015-11-26 DIAGNOSIS — G629 Polyneuropathy, unspecified: Secondary | ICD-10-CM | POA: Diagnosis not present

## 2015-11-26 DIAGNOSIS — E782 Mixed hyperlipidemia: Secondary | ICD-10-CM | POA: Insufficient documentation

## 2015-11-26 DIAGNOSIS — I2510 Atherosclerotic heart disease of native coronary artery without angina pectoris: Secondary | ICD-10-CM | POA: Insufficient documentation

## 2015-11-26 DIAGNOSIS — M810 Age-related osteoporosis without current pathological fracture: Secondary | ICD-10-CM | POA: Insufficient documentation

## 2015-11-26 DIAGNOSIS — F17210 Nicotine dependence, cigarettes, uncomplicated: Secondary | ICD-10-CM | POA: Diagnosis not present

## 2015-11-26 DIAGNOSIS — M47816 Spondylosis without myelopathy or radiculopathy, lumbar region: Secondary | ICD-10-CM

## 2015-11-26 DIAGNOSIS — E1142 Type 2 diabetes mellitus with diabetic polyneuropathy: Secondary | ICD-10-CM | POA: Insufficient documentation

## 2015-11-26 DIAGNOSIS — Z85528 Personal history of other malignant neoplasm of kidney: Secondary | ICD-10-CM | POA: Diagnosis not present

## 2015-11-26 DIAGNOSIS — G894 Chronic pain syndrome: Secondary | ICD-10-CM | POA: Insufficient documentation

## 2015-11-26 DIAGNOSIS — E669 Obesity, unspecified: Secondary | ICD-10-CM | POA: Insufficient documentation

## 2015-11-26 NOTE — Progress Notes (Signed)
  [facility]   Name: ABRIELA BEFORT DOB:[birth year] MRN: [id number]  Date:11/26/2015  Physician: Alysia Penna, MD    Nurse/CMA: Gara Kroner, CMA  Allergies:  Allergies  Allergen Reactions  . Adhesive [Tape] Rash  . Gabapentin Swelling  . Lyrica [Pregabalin] Swelling    Consent Signed: Yes.    Is patient diabetic? Yes.    CBG today? 112  Pregnant: No. LMP: No LMP recorded. Patient has had a hysterectomy. (age 65-55)  Anticoagulants: no Anti-inflammatory: no Antibiotics: no  Procedure: bilateral medial branch block  Position: Prone Start Time: 1007 End Time: 1018  Fluoro Time: 43  RN/CMA Mancel Parsons Amber Wood    Time 9:35am 1021    BP 137/66 192/89    Pulse 74 75    Respirations 14 14    O2 Sat 95 97    S/S 6 6    Pain Level 4/10 2/10     D/C home with medical transportion, patient A & O X 3, D/C instructions reviewed, and sits independently.

## 2015-11-26 NOTE — Patient Instructions (Signed)

## 2015-11-26 NOTE — Progress Notes (Signed)
Bilateral Lumbar L3, L4  medial branch blocks and L 5 dorsal ramus injection under fluoroscopic guidance  Indication: Lumbar pain which is not relieved by medication management or other conservative care and interfering with self-care and mobility.  Informed consent was obtained after describing risks and benefits of the procedure with the patient, this includes bleeding, infection, paralysis and medication side effects.  The patient wishes to proceed and has given written consent.  The patient was placed in prone position.  The lumbar area was marked and prepped with Betadine.  One mL of 1% lidocaine was injected into each of 6 areas into the skin and subcutaneous tissue.  Then a 22-gauge 3.5in spinal needle was inserted targeting the junction of the left S1 superior articular process and sacral ala junction. Needle was advanced under fluoroscopic guidance.  Bone contact was made.  Omnipaque 180 was injected x 0.5 mL demonstrating no intravascular uptake.  Then a solution containing one mL of 4 mg per mL dexamethasone and 3 mL of 2% MPF lidocaine was injected x 0.5 mL.  Then the left L5 superior articular process in transverse process junction was targeted.  Bone contact was made.  Omnipaque 180 was injected x 0.5 mL demonstrating no intravascular uptake. Then a solution containing one mL of 4 mg per mL dexamethasone and 3 mL of 2% MPF lidocaine was injected x 0.5 mL.  Then the left L4 superior articular process in transverse process junction was targeted.  Bone contact was made.  Omnipaque 180 was injected x 0.5 mL demonstrating no intravascular uptake.  Then a solution containing one mL of 4 mg per mL dexamethasone and 3 mL if 2% MPF lidocaine was injected x 0.5 mL.  This same procedure was performed on the right side using the same needle, technique and injectate.  Patient tolerated procedure well.  Post procedure instructions were given.

## 2015-11-30 ENCOUNTER — Ambulatory Visit (HOSPITAL_BASED_OUTPATIENT_CLINIC_OR_DEPARTMENT_OTHER)
Admission: RE | Admit: 2015-11-30 | Discharge: 2015-11-30 | Disposition: A | Discharge status: Home / Self Care | Payer: Medicare Other | Source: Ambulatory Visit | Attending: Surgery | Admitting: Surgery

## 2015-11-30 ENCOUNTER — Ambulatory Visit (HOSPITAL_BASED_OUTPATIENT_CLINIC_OR_DEPARTMENT_OTHER): Payer: Medicare Other | Admitting: Anesthesiology

## 2015-11-30 ENCOUNTER — Encounter (HOSPITAL_BASED_OUTPATIENT_CLINIC_OR_DEPARTMENT_OTHER)
Admission: RE | Disposition: A | Discharge status: Home / Self Care | Payer: Self-pay | Source: Ambulatory Visit | Attending: Surgery

## 2015-11-30 ENCOUNTER — Encounter (HOSPITAL_BASED_OUTPATIENT_CLINIC_OR_DEPARTMENT_OTHER): Payer: Self-pay

## 2015-11-30 ENCOUNTER — Ambulatory Visit
Admission: RE | Admit: 2015-11-30 | Discharge: 2015-11-30 | Disposition: A | Discharge status: Home / Self Care | Payer: Medicare Other | Source: Ambulatory Visit | Attending: Surgery | Admitting: Surgery

## 2015-11-30 DIAGNOSIS — Z803 Family history of malignant neoplasm of breast: Secondary | ICD-10-CM | POA: Insufficient documentation

## 2015-11-30 DIAGNOSIS — Z7982 Long term (current) use of aspirin: Secondary | ICD-10-CM | POA: Insufficient documentation

## 2015-11-30 DIAGNOSIS — Z7984 Long term (current) use of oral hypoglycemic drugs: Secondary | ICD-10-CM | POA: Insufficient documentation

## 2015-11-30 DIAGNOSIS — E7800 Pure hypercholesterolemia, unspecified: Secondary | ICD-10-CM | POA: Insufficient documentation

## 2015-11-30 DIAGNOSIS — N6082 Other benign mammary dysplasias of left breast: Secondary | ICD-10-CM | POA: Diagnosis not present

## 2015-11-30 DIAGNOSIS — I10 Essential (primary) hypertension: Secondary | ICD-10-CM | POA: Insufficient documentation

## 2015-11-30 DIAGNOSIS — N6012 Diffuse cystic mastopathy of left breast: Secondary | ICD-10-CM | POA: Diagnosis not present

## 2015-11-30 DIAGNOSIS — F17200 Nicotine dependence, unspecified, uncomplicated: Secondary | ICD-10-CM | POA: Insufficient documentation

## 2015-11-30 DIAGNOSIS — R921 Mammographic calcification found on diagnostic imaging of breast: Secondary | ICD-10-CM | POA: Diagnosis not present

## 2015-11-30 DIAGNOSIS — G4730 Sleep apnea, unspecified: Secondary | ICD-10-CM | POA: Insufficient documentation

## 2015-11-30 DIAGNOSIS — I252 Old myocardial infarction: Secondary | ICD-10-CM | POA: Diagnosis not present

## 2015-11-30 DIAGNOSIS — E119 Type 2 diabetes mellitus without complications: Secondary | ICD-10-CM | POA: Insufficient documentation

## 2015-11-30 DIAGNOSIS — Z955 Presence of coronary angioplasty implant and graft: Secondary | ICD-10-CM | POA: Insufficient documentation

## 2015-11-30 DIAGNOSIS — Z79899 Other long term (current) drug therapy: Secondary | ICD-10-CM | POA: Insufficient documentation

## 2015-11-30 DIAGNOSIS — Z905 Acquired absence of kidney: Secondary | ICD-10-CM | POA: Insufficient documentation

## 2015-11-30 DIAGNOSIS — K219 Gastro-esophageal reflux disease without esophagitis: Secondary | ICD-10-CM | POA: Diagnosis not present

## 2015-11-30 DIAGNOSIS — I2510 Atherosclerotic heart disease of native coronary artery without angina pectoris: Secondary | ICD-10-CM | POA: Diagnosis not present

## 2015-11-30 DIAGNOSIS — R928 Other abnormal and inconclusive findings on diagnostic imaging of breast: Secondary | ICD-10-CM | POA: Diagnosis not present

## 2015-11-30 DIAGNOSIS — M1990 Unspecified osteoarthritis, unspecified site: Secondary | ICD-10-CM | POA: Insufficient documentation

## 2015-11-30 DIAGNOSIS — N6092 Unspecified benign mammary dysplasia of left breast: Secondary | ICD-10-CM | POA: Insufficient documentation

## 2015-11-30 HISTORY — DX: Anxiety disorder, unspecified: F41.9

## 2015-11-30 HISTORY — DX: Sleep apnea, unspecified: G47.30

## 2015-11-30 HISTORY — PX: BREAST LUMPECTOMY WITH RADIOACTIVE SEED LOCALIZATION: SHX6424

## 2015-11-30 HISTORY — DX: Gastro-esophageal reflux disease without esophagitis: K21.9

## 2015-11-30 LAB — GLUCOSE, CAPILLARY
GLUCOSE-CAPILLARY: 65 mg/dL (ref 65–99)
GLUCOSE-CAPILLARY: 128 mg/dL — AB (ref 65–99)
Glucose-Capillary: 78 mg/dL (ref 65–99)

## 2015-11-30 SURGERY — BREAST LUMPECTOMY WITH RADIOACTIVE SEED LOCALIZATION
Anesthesia: General | Site: Breast | Laterality: Left

## 2015-11-30 MED ORDER — PROPOFOL 500 MG/50ML IV EMUL
INTRAVENOUS | Status: AC
  Filled 2015-11-30: qty 50

## 2015-11-30 MED ORDER — HYDROCODONE-ACETAMINOPHEN 5-325 MG PO TABS: 1.0000 | ORAL_TABLET | Freq: Four times a day (QID) | ORAL | Status: DC | PRN

## 2015-11-30 MED ORDER — FENTANYL CITRATE (PF) 100 MCG/2ML IJ SOLN
INTRAMUSCULAR | Status: AC
  Filled 2015-11-30: qty 2

## 2015-11-30 MED ORDER — SUCCINYLCHOLINE CHLORIDE 20 MG/ML IJ SOLN
INTRAMUSCULAR | Status: AC
  Filled 2015-11-30: qty 1

## 2015-11-30 MED ORDER — BUPIVACAINE HCL (PF) 0.25 % IJ SOLN
INTRAMUSCULAR | Status: AC
  Filled 2015-11-30: qty 120

## 2015-11-30 MED ORDER — MIDAZOLAM HCL 2 MG/2ML IJ SOLN
1.0000 mg | INTRAMUSCULAR | Status: DC | PRN
  Administered 2015-11-30: 2 mg via INTRAVENOUS

## 2015-11-30 MED ORDER — CHLORHEXIDINE GLUCONATE 4 % EX LIQD: 1.0000 "application " | Freq: Once | CUTANEOUS | Status: DC

## 2015-11-30 MED ORDER — HYDROCODONE-ACETAMINOPHEN 5-325 MG PO TABS
ORAL_TABLET | ORAL | Status: AC
  Filled 2015-11-30: qty 1

## 2015-11-30 MED ORDER — DEXTROSE 50 % IV SOLN
12.5000 g | Freq: Once | INTRAVENOUS | Status: AC
  Administered 2015-11-30: 12.5 g via INTRAVENOUS

## 2015-11-30 MED ORDER — FENTANYL CITRATE (PF) 100 MCG/2ML IJ SOLN
50.0000 ug | INTRAMUSCULAR | Status: DC | PRN
  Administered 2015-11-30: 50 ug via INTRAVENOUS

## 2015-11-30 MED ORDER — ONDANSETRON HCL 4 MG/2ML IJ SOLN
INTRAMUSCULAR | Status: AC
  Filled 2015-11-30: qty 2

## 2015-11-30 MED ORDER — GLYCOPYRROLATE 0.2 MG/ML IJ SOLN: 0.2000 mg | Freq: Once | INTRAMUSCULAR | Status: DC | PRN

## 2015-11-30 MED ORDER — ONDANSETRON HCL 4 MG/2ML IJ SOLN: 4.0000 mg | Freq: Once | INTRAMUSCULAR | Status: DC | PRN

## 2015-11-30 MED ORDER — DEXAMETHASONE SODIUM PHOSPHATE 10 MG/ML IJ SOLN
INTRAMUSCULAR | Status: AC
  Filled 2015-11-30: qty 1

## 2015-11-30 MED ORDER — BUPIVACAINE-EPINEPHRINE (PF) 0.25% -1:200000 IJ SOLN
INTRAMUSCULAR | Status: DC | PRN
  Administered 2015-11-30: 10 mL via PERINEURAL

## 2015-11-30 MED ORDER — FENTANYL CITRATE (PF) 100 MCG/2ML IJ SOLN
25.0000 ug | INTRAMUSCULAR | Status: DC | PRN
  Administered 2015-11-30: 25 ug via INTRAVENOUS
  Administered 2015-11-30 (×2): 50 ug via INTRAVENOUS

## 2015-11-30 MED ORDER — DEXTROSE 50 % IV SOLN
INTRAVENOUS | Status: AC
  Filled 2015-11-30: qty 50

## 2015-11-30 MED ORDER — LACTATED RINGERS IV SOLN
INTRAVENOUS | Status: DC
  Administered 2015-11-30 (×2): via INTRAVENOUS

## 2015-11-30 MED ORDER — CEFAZOLIN SODIUM-DEXTROSE 2-3 GM-% IV SOLR
2.0000 g | INTRAVENOUS | Status: AC
  Administered 2015-11-30: 2 g via INTRAVENOUS

## 2015-11-30 MED ORDER — ONDANSETRON HCL 4 MG/2ML IJ SOLN
INTRAMUSCULAR | Status: DC | PRN
  Administered 2015-11-30: 4 mg via INTRAVENOUS

## 2015-11-30 MED ORDER — HYDROCODONE-ACETAMINOPHEN 5-325 MG PO TABS
1.0000 | ORAL_TABLET | Freq: Once | ORAL | Status: AC
  Administered 2015-11-30: 1 via ORAL

## 2015-11-30 MED ORDER — LIDOCAINE HCL (CARDIAC) 20 MG/ML IV SOLN
INTRAVENOUS | Status: DC | PRN
  Administered 2015-11-30: 80 mg via INTRAVENOUS

## 2015-11-30 MED ORDER — MIDAZOLAM HCL 2 MG/2ML IJ SOLN
INTRAMUSCULAR | Status: AC
  Filled 2015-11-30: qty 2

## 2015-11-30 MED ORDER — PROPOFOL 10 MG/ML IV BOLUS
INTRAVENOUS | Status: DC | PRN
  Administered 2015-11-30: 100 mg via INTRAVENOUS

## 2015-11-30 MED ORDER — LIDOCAINE HCL (CARDIAC) 20 MG/ML IV SOLN
INTRAVENOUS | Status: AC
  Filled 2015-11-30: qty 5

## 2015-11-30 MED ORDER — SCOPOLAMINE 1 MG/3DAYS TD PT72: 1.0000 | MEDICATED_PATCH | Freq: Once | TRANSDERMAL | Status: DC | PRN

## 2015-11-30 MED ORDER — CEFAZOLIN SODIUM-DEXTROSE 2-3 GM-% IV SOLR
INTRAVENOUS | Status: AC
  Filled 2015-11-30: qty 50

## 2015-11-30 SURGICAL SUPPLY — 49 items
APPLIER CLIP 9.375 MED OPEN (MISCELLANEOUS)
APR CLP MED 9.3 20 MLT OPN (MISCELLANEOUS)
BINDER BREAST LRG (GAUZE/BANDAGES/DRESSINGS) IMPLANT
BINDER BREAST MEDIUM (GAUZE/BANDAGES/DRESSINGS) ×3 IMPLANT
BINDER BREAST XLRG (GAUZE/BANDAGES/DRESSINGS) IMPLANT
BINDER BREAST XXLRG (GAUZE/BANDAGES/DRESSINGS) IMPLANT
BLADE SURG 15 STRL LF DISP TIS (BLADE) ×1 IMPLANT
BLADE SURG 15 STRL SS (BLADE) ×3
CANISTER SUC SOCK COL 7IN (MISCELLANEOUS) IMPLANT
CANISTER SUCT 1200ML W/VALVE (MISCELLANEOUS) IMPLANT
CHLORAPREP W/TINT 26ML (MISCELLANEOUS) ×3 IMPLANT
CLIP APPLIE 9.375 MED OPEN (MISCELLANEOUS) IMPLANT
COVER BACK TABLE 60X90IN (DRAPES) ×3 IMPLANT
COVER MAYO STAND STRL (DRAPES) ×3 IMPLANT
COVER PROBE W GEL 5X96 (DRAPES) ×3 IMPLANT
DECANTER SPIKE VIAL GLASS SM (MISCELLANEOUS) IMPLANT
DEVICE DUBIN W/COMP PLATE 8390 (MISCELLANEOUS) ×3 IMPLANT
DRAPE LAPAROSCOPIC ABDOMINAL (DRAPES) IMPLANT
DRAPE LAPAROTOMY 100X72 PEDS (DRAPES) ×3 IMPLANT
DRAPE UTILITY XL STRL (DRAPES) ×3 IMPLANT
ELECT COATED BLADE 2.86 ST (ELECTRODE) ×3 IMPLANT
ELECT REM PT RETURN 9FT ADLT (ELECTROSURGICAL) ×3
ELECTRODE REM PT RTRN 9FT ADLT (ELECTROSURGICAL) ×1 IMPLANT
GLOVE BIOGEL PI IND STRL 6.5 (GLOVE) ×2 IMPLANT
GLOVE BIOGEL PI IND STRL 8 (GLOVE) ×1 IMPLANT
GLOVE BIOGEL PI INDICATOR 6.5 (GLOVE) ×4
GLOVE BIOGEL PI INDICATOR 8 (GLOVE) ×2
GLOVE ECLIPSE 6.5 STRL STRAW (GLOVE) ×3 IMPLANT
GLOVE ECLIPSE 8.0 STRL XLNG CF (GLOVE) ×3 IMPLANT
GOWN STRL REUS W/ TWL LRG LVL3 (GOWN DISPOSABLE) ×2 IMPLANT
GOWN STRL REUS W/TWL LRG LVL3 (GOWN DISPOSABLE) ×6
HEMOSTAT SNOW SURGICEL 2X4 (HEMOSTASIS) IMPLANT
KIT MARKER MARGIN INK (KITS) ×3 IMPLANT
LIQUID BAND (GAUZE/BANDAGES/DRESSINGS) ×3 IMPLANT
NEEDLE HYPO 25X1 1.5 SAFETY (NEEDLE) ×3 IMPLANT
NS IRRIG 1000ML POUR BTL (IV SOLUTION) ×3 IMPLANT
PACK BASIN DAY SURGERY FS (CUSTOM PROCEDURE TRAY) ×3 IMPLANT
PENCIL BUTTON HOLSTER BLD 10FT (ELECTRODE) ×3 IMPLANT
SLEEVE SCD COMPRESS KNEE MED (MISCELLANEOUS) ×3 IMPLANT
SPONGE LAP 4X18 X RAY DECT (DISPOSABLE) ×3 IMPLANT
SUT MNCRL AB 4-0 PS2 18 (SUTURE) ×3 IMPLANT
SUT SILK 2 0 SH (SUTURE) IMPLANT
SUT VICRYL 3-0 CR8 SH (SUTURE) ×3 IMPLANT
SYR CONTROL 10ML LL (SYRINGE) ×3 IMPLANT
TOWEL OR 17X24 6PK STRL BLUE (TOWEL DISPOSABLE) ×3 IMPLANT
TOWEL OR NON WOVEN STRL DISP B (DISPOSABLE) ×3 IMPLANT
TUBE CONNECTING 20'X1/4 (TUBING)
TUBE CONNECTING 20X1/4 (TUBING) IMPLANT
YANKAUER SUCT BULB TIP NO VENT (SUCTIONS) IMPLANT

## 2015-11-30 NOTE — Interval H&P Note (Signed)
History and Physical Interval Note:  11/30/2015 7:22 AM  [name]  has presented today for surgery, with the diagnosis of Left breast atypical ductal hyperplasia  The various methods of treatment have been discussed with the patient and family. After consideration of risks, benefits and other options for treatment, the patient has consented to  Procedure(s): BREAST LUMPECTOMY WITH RADIOACTIVE SEED LOCALIZATION (Left) as a surgical intervention .  The patient's history has been reviewed, patient examined, no change in status, stable for surgery.  I have reviewed the patient's chart and labs.  Questions were answered to the patient's satisfaction.     France Noyce A.

## 2015-11-30 NOTE — Transfer of Care (Signed)
Immediate Anesthesia Transfer of Care Note  Patient: Theresa Cline  Procedure(s) Performed: Procedure(s): BREAST LUMPECTOMY WITH RADIOACTIVE SEED LOCALIZATION (Left)  Patient Location: PACU  Anesthesia Type:General  Level of Consciousness: awake, sedated and patient cooperative  Airway & Oxygen Therapy: Patient Spontanous Breathing and Patient connected to face mask oxygen  Post-op Assessment: Report given to RN and Post -op Vital signs reviewed and stable  Post vital signs: Reviewed and stable  Last Vitals:  Filed Vitals:   11/30/15 0655 11/30/15 0816  BP: 153/77   Pulse: 66 66  Temp: 36.7 C   Resp: 18     Complications: No apparent anesthesia complications

## 2015-11-30 NOTE — Anesthesia Preprocedure Evaluation (Addendum)
Anesthesia Evaluation  Patient identified by MRN, date of birth, ID band Patient awake    Reviewed: Allergy & Precautions, NPO status , Patient's Chart, lab work & pertinent test results  History of Anesthesia Complications Negative for: history of anesthetic complications  Airway Mallampati: II  TM Distance: >3 FB Neck ROM: Full    Dental no notable dental hx. (+) Dental Advisory Given   Pulmonary sleep apnea , Current Smoker,    Pulmonary exam normal breath sounds clear to auscultation       Cardiovascular hypertension, Pt. on medications and Pt. on home beta blockers + CAD and + Cardiac Stents (2004)  Normal cardiovascular exam Rhythm:Regular Rate:Normal  Cleared by cardiology, no ischemia on stress test 10/01/15   Neuro/Psych PSYCHIATRIC DISORDERS Anxiety negative neurological ROS     GI/Hepatic Neg liver ROS, GERD  Medicated and Controlled,  Endo/Other  diabetes, Oral Hypoglycemic Agents  Renal/GU negative Renal ROS  negative genitourinary   Musculoskeletal  (+) Arthritis , Osteoarthritis,    Abdominal   Peds negative pediatric ROS (+)  Hematology negative hematology ROS (+)   Anesthesia Other Findings   Reproductive/Obstetrics negative OB ROS                            Anesthesia Physical Anesthesia Plan  ASA: II  Anesthesia Plan: General   Post-op Pain Management:    Induction: Intravenous  Airway Management Planned: LMA  Additional Equipment:   Intra-op Plan:   Post-operative Plan: Extubation in OR  Informed Consent: I have reviewed the patients History and Physical, chart, labs and discussed the procedure including the risks, benefits and alternatives for the proposed anesthesia with the patient or authorized representative who has indicated his/her understanding and acceptance.   Dental advisory given  Plan Discussed with: CRNA  Anesthesia Plan Comments:          Anesthesia Quick Evaluation

## 2015-11-30 NOTE — Discharge Instructions (Signed)
[facility] °Office Phone Number [phone] ° °BREAST BIOPSY/ PARTIAL MASTECTOMY: POST OP INSTRUCTIONS ° °Always review your discharge instruction sheet given to you by the facility where your surgery was performed. ° °IF YOU HAVE DISABILITY OR FAMILY LEAVE FORMS, YOU MUST BRING THEM TO THE OFFICE FOR PROCESSING.  DO NOT GIVE THEM TO YOUR DOCTOR. ° °1. A prescription for pain medication may be given to you upon discharge.  Take your pain medication as prescribed, if needed.  If narcotic pain medicine is not needed, then you may take acetaminophen (Tylenol) or ibuprofen (Advil) as needed. °2. Take your usually prescribed medications unless otherwise directed °3. If you need a refill on your pain medication, please contact your pharmacy.  They will contact our office to request authorization.  Prescriptions will not be filled after 5pm or on week-ends. °4. You should eat very light the first 24 hours after surgery, such as soup, crackers, pudding, etc.  Resume your normal diet the day after surgery. °5. Most patients will experience some swelling and bruising in the breast.  Ice packs and a good support bra will help.  Swelling and bruising can take several days to resolve.  °6. It is common to experience some constipation if taking pain medication after surgery.  Increasing fluid intake and taking a stool softener will usually help or prevent this problem from occurring.  A mild laxative (Milk of Magnesia or Miralax) should be taken according to package directions if there are no bowel movements after 48 hours. °7. Unless discharge instructions indicate otherwise, you may remove your bandages 24-48 hours after surgery, and you may shower at that time.  You may have steri-strips (small skin tapes) in place directly over the incision.  These strips should be left on the skin for 7-10 days.  If your surgeon used skin glue on the incision, you may shower in 24 hours.  The glue will flake off over the  next 2-3 weeks.  Any sutures or staples will be removed at the office during your follow-up visit. °8. ACTIVITIES:  You may resume regular daily activities (gradually increasing) beginning the next day.  Wearing a good support bra or sports bra minimizes pain and swelling.  You may have sexual intercourse when it is comfortable. °a. You may drive when you no longer are taking prescription pain medication, you can comfortably wear a seatbelt, and you can safely maneuver your car and apply brakes. °b. RETURN TO WORK:  ______________________________________________________________________________________ °9. You should see your doctor in the office for a follow-up appointment approximately two weeks after your surgery.  Your doctor’s nurse will typically make your follow-up appointment when she calls you with your pathology report.  Expect your pathology report 2-3 business days after your surgery.  You may call to check if you do not hear from us after three days. °10. OTHER INSTRUCTIONS: _______________________________________________________________________________________________ _____________________________________________________________________________________________________________________________________ °_____________________________________________________________________________________________________________________________________ °_____________________________________________________________________________________________________________________________________ ° °WHEN TO CALL YOUR DOCTOR: °1. Fever over 101.0 °2. Nausea and/or vomiting. °3. Extreme swelling or bruising. °4. Continued bleeding from incision. °5. Increased pain, redness, or drainage from the incision. ° °The clinic staff is available to answer your questions during regular business hours.  Please don’t hesitate to call and ask to speak to one of the nurses for clinical concerns.  If you have a medical emergency, go to the nearest  emergency room or call 911.  A surgeon from [facility] is always on call at the hospital. ° °For further questions, please visit [email or web address]  ° ° ° °  Post Anesthesia Home Care Instructions ° °Activity: °Get plenty of rest for the remainder of the day. A responsible adult should stay with you for 24 hours following the procedure.  °For the next 24 hours, DO NOT: °-Drive a car °-Operate machinery °-Drink alcoholic beverages °-Take any medication unless instructed by your physician °-Make any legal decisions or sign important papers. ° °Meals: °Start with liquid foods such as gelatin or soup. Progress to regular foods as tolerated. Avoid greasy, spicy, heavy foods. If nausea and/or vomiting occur, drink only clear liquids until the nausea and/or vomiting subsides. Call your physician if vomiting continues. ° °Special Instructions/Symptoms: °Your throat may feel dry or sore from the anesthesia or the breathing tube placed in your throat during surgery. If this causes discomfort, gargle with warm salt water. The discomfort should disappear within 24 hours. ° °If you had a scopolamine patch placed behind your ear for the management of post- operative nausea and/or vomiting: ° °1. The medication in the patch is effective for 72 hours, after which it should be removed.  Wrap patch in a tissue and discard in the trash. Wash hands thoroughly with soap and water. °2. You may remove the patch earlier than 72 hours if you experience unpleasant side effects which may include dry mouth, dizziness or visual disturbances. °3. Avoid touching the patch. Wash your hands with soap and water after contact with the patch. °  ° °

## 2015-11-30 NOTE — H&P (Signed)
H&P   Theresa Cline (MR# [id number])      H&P Info    Author Note Status Last Update User Last Update Date/Time   Erroll Luna, MD Signed Erroll Luna, MD     H&P    Expand All Collapse All   [name]  Location: [facility] Patient #: [id number] DOB: 1951/05/14 Divorced / Language: Cleophus Molt / Race: Black or African American Female  History of Present Illness  Patient words: l br ADH  Patient said at the request of Dr. Curlene Dolphin for a left breast atypical ductal hyperplasia was diagnosed by her recent screening mammogram and core biopsy. Patient denies any problems with either breast. Patient denies any history of breast mass, nipple discharge or change in the appearance of her breasts. The patient has 2 first-degree relatives with breast cancer.              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 - SEE COMMENT. CLINICAL DATA: Stereotactic biopsy is performed of calcifications in the lower inner quadrant of the left breast. EXAM: DIAGNOSTIC LEFT MAMMOGRAM POST STEREOTACTIC BIOPSY COMPARISON: Previous exam(s). FINDINGS: Mammographic images were obtained following stereotactic guided biopsy of biopsy of calcifications in the lower inner quadrant of the left breast. X shaped biopsy clip is satisfactorily positioned in the expected location of the calcifications. Biopsy changes are seen in this region. No residual calcifications are identified. IMPRESSION: Satisfactory position of biopsy clip in the left breast. Final Assessment: Post Procedure Mammograms for Marker Placement.  The patient is a 65 year old female.   Other Problems Ventura Sellers,  Anxiety Disorder Arthritis Back Pain Cancer Diabetes Mellitus Gastroesophageal Reflux Disease High blood pressure Hypercholesterolemia Myocardial  infarction Sleep Apnea  Past Surgical History Ventura Sellers,  Breast Biopsy Left. Colon Polyp Removal - Colonoscopy Foot Surgery Right. Hysterectomy (not due to cancer) - Partial Knee Surgery Left. Nephrectomy Right.  Diagnostic Studies History Ventura Sellers, [place];  Colonoscopy 1-5 years ago Mammogram within last year Pap Smear never  Allergies Ventura Sellers, CMA Gabapentin *ANTICONVULSANTS* Lyrica *ANTICONVULSANTS*  Medication History Ventura Sellers, CMA; TraMADol HCl (50MG  Tablet, Oral) Active. Alendronate Sodium (70MG  Tablet, Oral) Active. Atorvastatin Calcium (40MG  Tablet, Oral) Active. [employer] Vitamin D (2000UNIT Capsule, Oral) Active. HydrALAZINE HCl (50MG  Tablet, Oral) Active. MetFORMIN HCl (1000MG  Tablet, Oral) Active. Metoprolol Succinate ER (100MG  Tablet ER 24HR, Oral) Active. Omeprazole (40MG  Capsule DR, Oral) Active. Valsartan-Hydrochlorothiazide (320-25MG  Tablet, Oral) Active. Aspirin Low Dose (81MG  Tablet DR, Oral) Active. Fish Oil Active. Ipratropium Bromide (0.06% Solution, Nasal) Active. Vitamin E (400UNIT Tablet, Oral) Active. Medications Reconciled  Social History Ventura Sellers, CMA Alcohol use Remotely quit alcohol use. No caffeine use No drug use Tobacco use Current every day smoker.  Family History Ventura Sellers, [place];  Alcohol Abuse Brother, Sister. Breast Cancer Family Members In General. Depression Sister. Diabetes Mellitus Sister. Heart disease in female family member before age 66 Heart disease in female family member before age 53 Hypertension Sister. Migraine Headache Daughter.  Pregnancy / Birth History Ventura Sellers, [place];  Age at menarche 19 years. Age of menopause <43 Gravida 6 Irregular periods Maternal age 63-20 Para 3     Review of Systems  General Present- Chills and Fatigue. Not Present- Appetite Loss, Fever, Night Sweats, Weight Gain and Weight  Loss. Skin Present- Dryness. Not Present- Change in Wart/Mole, Hives, Jaundice, New Lesions, Non-Healing Wounds, Rash and  Ulcer. HEENT Present- Hearing Loss, Seasonal Allergies, Sore Throat and Wears glasses/contact lenses. Not Present- Earache, Hoarseness, Nose Bleed, Oral Ulcers, Ringing in the Ears, Sinus Pain, Visual Disturbances and Yellow Eyes. Respiratory Present- Difficulty Breathing and Wheezing. Not Present- Bloody sputum, Chronic Cough and Snoring. Cardiovascular Present- Shortness of Breath and Swelling of Extremities. Not Present- Chest Pain, Difficulty Breathing Lying Down, Leg Cramps, Palpitations and Rapid Heart Rate. Gastrointestinal Present- Gets full quickly at meals, Nausea, Rectal Pain and Vomiting. Not Present- Abdominal Pain, Bloating, Bloody Stool, Change in Bowel Habits, Chronic diarrhea, Constipation, Difficulty Swallowing, Excessive gas, Hemorrhoids and Indigestion. Female Genitourinary Not Present- Frequency, Nocturia, Painful Urination, Pelvic Pain and Urgency. Musculoskeletal Present- Back Pain, Joint Pain, Joint Stiffness, Muscle Pain, Muscle Weakness and Swelling of Extremities. Neurological Present- Fainting, Numbness, Tingling, Tremor and Trouble walking. Not Present- Decreased Memory, Headaches, Seizures and Weakness. Psychiatric Present- Anxiety. Not Present- Bipolar, Change in Sleep Pattern, Depression, Fearful and Frequent crying. Endocrine Present- Hair Changes and Hot flashes. Not Present- Cold Intolerance, Excessive Hunger, Heat Intolerance and New Diabetes. Hematology Present- Easy Bruising. Not Present- Excessive bleeding, Gland problems, HIV and Persistent Infections.   Weight: 135.5 lb Height: 63in Body Surface Area: 1.64 m Body Mass Index: 24 kg/m  Pulse: 66 (Regular)  BP: 130/78 (Sitting, Left Arm, Standard)      Physical Exam   General Mental Status-Alert. General Appearance-Consistent with stated age. Hydration-Well  hydrated. Voice-Normal.  Head and Neck Head-normocephalic, atraumatic with no lesions or palpable masses. Trachea-midline. Thyroid Gland Characteristics - normal size and consistency.  Chest and Lung Exam Chest and lung exam reveals -quiet, even and easy respiratory effort with no use of accessory muscles and on auscultation, normal breath sounds, no adventitious sounds and normal vocal resonance. Inspection Chest Wall - Normal. Back - normal.  Breast Breast - Left-Symmetric, Non Tender, No Biopsy scars, no Dimpling, No Inflammation, No Lumpectomy scars, No Mastectomy scars, No Peau d' Orange. Breast - Right-Symmetric, Non Tender, No Biopsy scars, no Dimpling, No Inflammation, No Lumpectomy scars, No Mastectomy scars, No Peau d' Orange. Breast Lump-No Palpable Breast Mass.  Cardiovascular Cardiovascular examination reveals -normal heart sounds, regular rate and rhythm with no murmurs and normal pedal pulses bilaterally.  Neurologic Neurologic evaluation reveals -alert and oriented x 3 with no impairment of recent or remote memory. Mental Status-Normal.  Musculoskeletal Normal Exam - Left-Upper Extremity Strength Normal and Lower Extremity Strength Normal. Normal Exam - Right-Upper Extremity Strength Normal and Lower Extremity Strength Normal.  Lymphatic Head & Neck  General Head & Neck Lymphatics: Bilateral - Description - Normal. Axillary  General Axillary Region: Bilateral - Description - Normal. Tenderness - Non Tender.    Assessment & Plan  ATYPICAL DUCTAL HYPERPLASIA OF LEFT BREAST (N60.92Impression: DISCUSSED options of left breast lumpectomy versus observation. Recommend left breast C localized lumpectomy for atypical ductal hyperplasia to exclude malignancy. Risk of, malignancies 3% to 10% in the setting. Patient requires cardiac clearance. Questions were answered. Risk of lumpectomy include bleeding, infection, seroma, more surgery, use of  seed/wire, wound care, cosmetic deformity and the need for other treatments, death , blood clots, death. Pt agrees to proceed.  Current Plans Pt Education - [facility] Breast Biopsy HCI: discussed with patient and provided information. The anatomy and the physiology was discussed. The pathophysiology and natural history of the disease was discussed. Options were discussed and recommendations were made. Technique, risks, benefits, & alternatives were discussed. Risks such as stroke, heart attack, bleeding, indection, death, and other risks discussed. Questions answered.  The patient agrees to proceed.

## 2015-11-30 NOTE — Anesthesia Procedure Notes (Signed)
Procedure Name: LMA Insertion Date/Time: 11/30/2015 7:39 AM Performed by: Lyndee Leo Pre-anesthesia Checklist: Patient identified, Emergency Drugs available, Suction available and Patient being monitored Patient Re-evaluated:Patient Re-evaluated prior to inductionOxygen Delivery Method: Circle System Utilized Preoxygenation: Pre-oxygenation with 100% oxygen Intubation Type: IV induction Ventilation: Mask ventilation without difficulty LMA: LMA inserted LMA Size: 4.0 Number of attempts: 1 Airway Equipment and Method: Bite block Placement Confirmation: positive ETCO2 Tube secured with: Tape Dental Injury: Teeth and Oropharynx as per pre-operative assessment

## 2015-11-30 NOTE — Op Note (Signed)
Preoperative diagnosis: Left breast ADH  Postoperative diagnosis: Same   Procedure: Left breast seed localized lumpectomy  Surgeon: Erroll Luna M.D.  Anesthesia: Gen. With 0.25% Sensorcaine local  EBL: 20 cc  Specimen: Left breast tissue with clip and radioactive seed in the specimen. Verified with neoprobe and radiographic image showing both seed and clip in specimen  Indications for procedure: The patient presents for left breast lumpectomy after core biopsy showed ADH . Discussed the rationale for considering excision. Small risk of malignancy associated with THIS  lesion after core biopsy. Discussed observation. Discussed wire localization / SEED. Patient desired excision of left breast ADH.The procedure has been discussed with the patient. Alternatives to surgery have been discussed with the patient.  Risks of surgery include bleeding,  Infection,  Seroma formation, death,  and the need for further surgery.   The patient understands and wishes to proceed.   Description of procedure: Patient underwent seed placement as an outpatient. Patient presents today for left breast seed localized lumpectomy. Patient and holding area. Questions are answered and neoprobe used to verify seed location. Patient taken back to the operating room and placed upon the OR table. After induction of general anesthesia, left breast prepped and draped in a sterile fashion. Timeout was done to verify proper  procedure. Neoprobe used and hot spot identified and left breast upper-INNER  quadrant. This was marked with pen. Linear  incision made left upper inner  quadrant breast. Dissection used with the help of a neoprobe around the tissue where the seed and clip were located. Tissue removed in its entirety with gross negative  margins.. Neoprobe used and seed within specimen. Radiographs taken which show clip and seed  In specimen. Hemostasis achieved and cavity closed with 3-0 Vicryl and 4-0 Monocryl. Liquid adhesive   applied. All final counts found to be correct. Specimen transported to pathology. Patient awoke extubated taken to recovery in satisfactory condition.

## 2015-11-30 NOTE — Anesthesia Postprocedure Evaluation (Signed)
Anesthesia Post Note  Patient: Theresa Cline  Procedure(s) Performed: Procedure(s) (LRB): BREAST LUMPECTOMY WITH RADIOACTIVE SEED LOCALIZATION (Left)  Patient location during evaluation: PACU Anesthesia Type: General Level of consciousness: awake and alert Pain management: pain level controlled Vital Signs Assessment: post-procedure vital signs reviewed and stable Respiratory status: spontaneous breathing, nonlabored ventilation, respiratory function stable and patient connected to nasal cannula oxygen Cardiovascular status: blood pressure returned to baseline and stable Postop Assessment: no signs of nausea or vomiting Anesthetic complications: no    Last Vitals:  Filed Vitals:   11/30/15 0845 11/30/15 0900  BP: 144/88 135/85  Pulse: 62 62  Temp:    Resp: 9 10    Last Pain:  Filed Vitals:   11/30/15 0920  PainSc: 2                  Villa Burgin JENNETTE

## 2015-12-01 ENCOUNTER — Telehealth: Payer: Self-pay | Admitting: *Deleted

## 2015-12-01 ENCOUNTER — Encounter (HOSPITAL_BASED_OUTPATIENT_CLINIC_OR_DEPARTMENT_OTHER): Payer: Self-pay | Admitting: Surgery

## 2015-12-01 NOTE — Telephone Encounter (Signed)
Pt had a lumpectomy procedure performed the other day. Doctor prescribed hydrocodone/acetaminophen 5-325mg .  She is asking if it is okay to take.  I consulted Sybil and she said that is fine as long as the patient does not take her tramadol and that she can restart her tramadol once the hydrocodone runs out.  I called the patient back and advised accordingly

## 2015-12-09 ENCOUNTER — Other Ambulatory Visit: Payer: Self-pay | Admitting: Family Medicine

## 2015-12-10 ENCOUNTER — Telehealth: Payer: Self-pay | Admitting: *Deleted

## 2015-12-10 NOTE — Telephone Encounter (Signed)
Calling for Theresa Cline to request refill on her Cymbalta 30 mg.  Per last office visit you did not recommend Cymbalta due to high tramadol dose.  Please advise.

## 2015-12-11 NOTE — Telephone Encounter (Signed)
No RF unless we can reduce tramadol

## 2015-12-13 NOTE — Telephone Encounter (Signed)
Pharmacy notified.

## 2015-12-16 ENCOUNTER — Other Ambulatory Visit: Payer: Self-pay | Admitting: Family Medicine

## 2015-12-17 ENCOUNTER — Encounter: Payer: Self-pay | Admitting: Hematology

## 2015-12-17 ENCOUNTER — Telehealth: Payer: Self-pay | Admitting: Hematology

## 2015-12-17 NOTE — Telephone Encounter (Signed)
new high risk-patient scheduled for 03/27 @ 11 w/Dr. Burr Medico Referring Dr. Brantley Stage  Referral information scan

## 2015-12-24 ENCOUNTER — Other Ambulatory Visit: Payer: Self-pay | Admitting: *Deleted

## 2015-12-24 MED ORDER — ALENDRONATE SODIUM 70 MG PO TABS: 70.0000 mg | ORAL_TABLET | ORAL | Status: DC

## 2015-12-31 ENCOUNTER — Telehealth: Payer: Self-pay | Admitting: *Deleted

## 2015-12-31 NOTE — Telephone Encounter (Signed)
Call received in [place] from pt stating she " received a call from this number but I couldn't understand what it was saying ".  This RN noted pt is scheduled for an appointment on 3/27 with Dr Burr Medico for High Risk evaluation.  Informed pt of above- which she was unaware of but stated she could not make this appointment due to need to arrange appropriate transportation.  She states she needs a 5 day notice to arrange transportation.  This message will be sent to Dr Burr Medico and her nurse as well as to HIM for appropriate rescheduling.  Return call number given per this discussion is [phone]

## 2016-01-03 ENCOUNTER — Encounter: Payer: Self-pay | Admitting: Hematology

## 2016-01-04 ENCOUNTER — Ambulatory Visit: Payer: Self-pay | Admitting: Family Medicine

## 2016-01-04 ENCOUNTER — Telehealth: Payer: Self-pay | Admitting: *Deleted

## 2016-01-04 NOTE — Telephone Encounter (Signed)
Iysha called.  Her [place] primary MD has cut her cymbalta.  She is saying she needs that medication.  I explained about the high dose of Tramadol to her with combination of Cymbalta and the threat of serotonin syndrome. She says that she is only taking 100 mg of tramadol bid now.  Please advise.Marland KitchenMarland Kitchen

## 2016-01-05 NOTE — Telephone Encounter (Signed)
Her call was actually about her cymbalta being stopped and needing to be on that medication.

## 2016-01-05 NOTE — Telephone Encounter (Signed)
So her option is either tramadol 50mg  QID, with cymbalta 30mg  or the 100mg  tramadol TID without cymbalta

## 2016-01-05 NOTE — Telephone Encounter (Signed)
Left message to call office

## 2016-01-05 NOTE — Telephone Encounter (Signed)
May resume tramadol 100mg  TID

## 2016-01-07 MED ORDER — TRAMADOL HCL 50 MG PO TABS: 100.0000 mg | ORAL_TABLET | Freq: Two times a day (BID) | ORAL | Status: DC

## 2016-01-07 MED ORDER — DULOXETINE HCL 30 MG PO CPEP: 30.0000 mg | ORAL_CAPSULE | Freq: Every day | ORAL | Status: DC

## 2016-01-07 NOTE — Telephone Encounter (Signed)
Theresa Cline is only taking the tramadol 100 mg bid.  I have given her the stipulations that she is NOT to take it more than that, and we will order the cymbalta 30 mg daily.  She understands and will comply. Order sent to pharmacy for 30 mg cymbalta and new directions on tramadol.

## 2016-01-13 ENCOUNTER — Other Ambulatory Visit: Payer: Self-pay | Admitting: *Deleted

## 2016-01-13 MED ORDER — ALENDRONATE SODIUM 70 MG PO TABS: 70.0000 mg | ORAL_TABLET | ORAL | Status: DC

## 2016-02-09 ENCOUNTER — Other Ambulatory Visit (INDEPENDENT_AMBULATORY_CARE_PROVIDER_SITE_OTHER): Payer: Medicare Other

## 2016-02-09 ENCOUNTER — Encounter: Payer: Self-pay | Admitting: Internal Medicine

## 2016-02-09 ENCOUNTER — Ambulatory Visit (INDEPENDENT_AMBULATORY_CARE_PROVIDER_SITE_OTHER): Payer: Medicare Other | Admitting: Internal Medicine

## 2016-02-09 VITALS — BP 158/90 | HR 70 | Temp 98.3°F | Resp 16 | Wt 136.0 lb

## 2016-02-09 DIAGNOSIS — Z23 Encounter for immunization: Secondary | ICD-10-CM | POA: Diagnosis not present

## 2016-02-09 DIAGNOSIS — K219 Gastro-esophageal reflux disease without esophagitis: Secondary | ICD-10-CM | POA: Insufficient documentation

## 2016-02-09 DIAGNOSIS — M810 Age-related osteoporosis without current pathological fracture: Secondary | ICD-10-CM

## 2016-02-09 DIAGNOSIS — I10 Essential (primary) hypertension: Secondary | ICD-10-CM

## 2016-02-09 DIAGNOSIS — H9190 Unspecified hearing loss, unspecified ear: Secondary | ICD-10-CM

## 2016-02-09 DIAGNOSIS — E782 Mixed hyperlipidemia: Secondary | ICD-10-CM

## 2016-02-09 DIAGNOSIS — E1142 Type 2 diabetes mellitus with diabetic polyneuropathy: Secondary | ICD-10-CM | POA: Diagnosis not present

## 2016-02-09 DIAGNOSIS — C801 Malignant (primary) neoplasm, unspecified: Secondary | ICD-10-CM

## 2016-02-09 DIAGNOSIS — D333 Benign neoplasm of cranial nerves: Secondary | ICD-10-CM

## 2016-02-09 DIAGNOSIS — I2510 Atherosclerotic heart disease of native coronary artery without angina pectoris: Secondary | ICD-10-CM

## 2016-02-09 DIAGNOSIS — M48061 Spinal stenosis, lumbar region without neurogenic claudication: Secondary | ICD-10-CM

## 2016-02-09 DIAGNOSIS — M4806 Spinal stenosis, lumbar region: Secondary | ICD-10-CM

## 2016-02-09 LAB — COMPREHENSIVE METABOLIC PANEL
ALT: 12 U/L (ref 0–35)
AST: 20 U/L (ref 0–37)
Albumin: 4.1 g/dL (ref 3.5–5.2)
Alkaline Phosphatase: 51 U/L (ref 39–117)
BUN: 21 mg/dL (ref 6–23)
CHLORIDE: 99 meq/L (ref 96–112)
CO2: 33 meq/L — AB (ref 19–32)
CREATININE: 1.27 mg/dL — AB (ref 0.40–1.20)
Calcium: 9.8 mg/dL (ref 8.4–10.5)
GFR: 53.94 mL/min — ABNORMAL LOW (ref >60.00)
GLUCOSE: 79 mg/dL (ref 70–99)
Potassium: 4.1 mEq/L (ref 3.5–5.1)
SODIUM: 139 meq/L (ref 135–145)
Total Bilirubin: 0.3 mg/dL (ref 0.2–1.2)
Total Protein: 7.2 g/dL (ref 6.0–8.3)

## 2016-02-09 LAB — TSH: TSH: 1.05 u[IU]/mL (ref 0.35–4.50)

## 2016-02-09 LAB — MICROALBUMIN / CREATININE URINE RATIO
Creatinine,U: 156.6 mg/dL
MICROALB/CREAT RATIO: 0.4 mg/g (ref 0.0–30.0)

## 2016-02-09 LAB — HEMOGLOBIN A1C: Hgb A1c MFr Bld: 6.4 % (ref 4.6–6.5)

## 2016-02-09 MED ORDER — OMEPRAZOLE 20 MG PO CPDR: 20.0000 mg | DELAYED_RELEASE_CAPSULE | Freq: Every day | ORAL | Status: DC

## 2016-02-09 MED ORDER — METFORMIN HCL 500 MG PO TABS: 500.0000 mg | ORAL_TABLET | Freq: Two times a day (BID) | ORAL | Status: DC

## 2016-02-09 NOTE — Assessment & Plan Note (Signed)
No evidence of recurrence 

## 2016-02-09 NOTE — Assessment & Plan Note (Signed)
BP typically well controlled at home She is monitoring at home and will continue to monitor it Continue current medication  cmp

## 2016-02-09 NOTE — Patient Instructions (Signed)
  Test(s) ordered today. Your results will be released to [place] (or called to you) after review, usually within 72hours after test completion. If any changes need to be made, you will be notified at that same time.  All other Health Maintenance issues reviewed.   All recommended immunizations and age-appropriate screenings are up-to-date or discussed.  prevnar vaccine administered today.   Medications reviewed and updated.  Changes include reducing metformin to 500mg  twice daily.  Your prescription(s) have been submitted to your pharmacy. Please take as directed and contact our office if you believe you are having problem(s) with the medication(s).  A referral was ordered for audiology.  An MRI of your brain was ordered.  A bone density was ordered  Please followup in 4 months

## 2016-02-09 NOTE — Assessment & Plan Note (Addendum)
S/p 3 stents in 2004,  Following with cardiology Asymptomatic Continue current medication

## 2016-02-09 NOTE — Assessment & Plan Note (Signed)
Taking tramadol and cymbalta Walks regularly

## 2016-02-09 NOTE — Assessment & Plan Note (Signed)
Taking atorvastatin 40 mg daily Check lipid panel

## 2016-02-09 NOTE — Assessment & Plan Note (Addendum)
Taking cymbalta Checking feet daily Diabetes well controlled

## 2016-02-09 NOTE — Assessment & Plan Note (Addendum)
On fosamax for 15 years - will stop Due for dexa - will schedule Taking calcium and vitamin D - continue Continue regular walking

## 2016-02-09 NOTE — Progress Notes (Signed)
Pre visit review using our clinic review tool, if applicable. No additional management support is needed unless otherwise documented below in the visit note. 

## 2016-02-09 NOTE — Assessment & Plan Note (Signed)
Controlled with omeprazole 40 mg daily Try decreasing omeprazole to 20 mg daily

## 2016-02-09 NOTE — Assessment & Plan Note (Addendum)
Will order MRI to confirm to recurrence Not having any concerning symptoms

## 2016-02-09 NOTE — Progress Notes (Signed)
Subjective:    Patient ID: Theresa Cline, female    DOB: 05-14-1951, 65 y.o.   MRN: [id number]  HPI She is here to establish with a new pcp.    Diabetes: She is taking her medication daily as prescribed - sometimes she only takes 500 mg because her sugar is on the lower side. She is compliant with a diabetic diet. She is exercising regularly, walks 30 minutes three times a week. She monitors her sugars and they have been running 71, 91, 113,88, 99, 112, 127, 96, 98, 117,99, 117, 102,113,90, 94, 78,91. She has some numbness and pain in her feet. The pain is from calluses.  She checks her feet daily and denies foot lesions. She is up-to-date with an ophthalmology examination.   Hypertension: She is taking her medication daily. She is compliant with a low sodium diet.  She denies chest pain, palpitations, edema, shortness of breath and regular headaches. She is exercising regularly - walking three times a week.  She does monitor her blood pressure at home and it is well controlled.    Hyperlipidemia: She is taking her medication daily. She is compliant with a low fat/cholesterol diet. She is exercising regularly. She denies myalgias.   She gets wheezing and cough from allergies.  She uses inhalers as needed.   Neuropathy:  She is taking cymbalta.  Back pain, spinal stenosis:  Taking tramadol daily and cymbalta. .    Anxiety: She is taking her medication daily as prescribed. She denies any side effects from the medication. She feels her anxiety is well controlled and she is happy with her current dose of medication.   GERD:  She is taking her medication daily as prescribed.  She denies any GERD symptoms and feels her GERD is well controlled. If she does not take her medication she will have difficulty swallowing.  She would like to come off the medication if possible.   Osteoporosis:    Medications and allergies reviewed with patient and updated if appropriate.  Patient Active  Problem List   Diagnosis Date Noted  . Hypokalemia 09/25/2013  . Diarrhea 09/25/2013  . Right knee DJD 01/18/2012  . Trochanteric bursitis of both hips 12/11/2011  . Spinal stenosis, lumbar region, without neurogenic claudication 12/11/2011  . Cancer ([place])   . Syncope 07/17/2011  . EDEMA 07/12/2010  . CAD, NATIVE VESSEL 12/10/2008  . Dizziness and giddiness 11/06/2008  . Diabetes ([place]) 10/15/2007  . Diabetic neuropathy associated with type 2 diabetes mellitus ([place]) 10/15/2007  . Mixed hyperlipidemia 10/15/2007  . OBESITY 10/15/2007  . TOBACCO ABUSE 10/15/2007  . Chronic pain syndrome 10/15/2007  . Essential hypertension 10/15/2007  . CAD 10/15/2007  . BRONCHITIS, ACUTE 10/15/2007  . Osteoarthrosis, unspecified whether generalized or localized, involving lower leg 10/15/2007  . Osteoporosis 10/09/2000  . CARPAL TUNNEL RELEASE, BILATERAL, HX OF 08/09/1998    Current Outpatient Prescriptions on File Prior to Visit  Medication Sig Dispense Refill  . ACCU-CHEK FASTCLIX LANCETS MISC USE TO TEST BLOOD SUGAR THREE TIMES A DAY AND EVERY NIGHT AT BEDTIME 102 each 1  . ACCU-CHEK SMARTVIEW test strip USE TO TEST BLOOD SUGAR THREE TIMES A DAY AND EVERY NIGHT AT BEDTIME 100 each 1  . albuterol (PROAIR HFA) 108 (90 BASE) MCG/ACT inhaler Inhale 2 puffs by mouth every 6 hours as needed for wheezing or SOB. Please schedule an office visit for more refills. 18 g 0  . alendronate (FOSAMAX) 70 MG tablet Take 1 tablet (70 mg  total) by mouth once a week. Take with a full glass of water on an empty stomach. 4 tablet 0  . aspirin EC 81 MG tablet Take 81 mg by mouth daily.    Marland Kitchen atorvastatin (LIPITOR) 40 MG tablet Take 1 tablet (40 mg total) by mouth daily. 90 tablet 2  . Cholecalciferol (VITAMIN D) 2000 UNITS CAPS Take 2,000 Units by mouth daily.    . DULoxetine (CYMBALTA) 30 MG capsule Take 1 capsule (30 mg total) by mouth daily. 30 capsule 2  . hydrALAZINE (APRESOLINE) 50 MG tablet Take 1 tablet (50 mg  total) by mouth 3 (three) times daily. 90 tablet 11  . ipratropium (ATROVENT) 0.06 % nasal spray INSTILL 2 SPRAYS IN EACH NOSTRIL FOUR TIMES A DAY 15 mL 2  . lidocaine (XYLOCAINE) 5 % ointment Apply 1 application topically 3 (three) times daily as needed. 100 g 2  . metFORMIN (GLUCOPHAGE) 1000 MG tablet Take 1 tablet (1,000 mg total) by mouth 2 (two) times daily with a meal. (Patient taking differently: Take 500 mg by mouth 2 (two) times daily with a meal. ) 60 tablet 5  . metoprolol succinate (TOPROL-XL) 100 MG 24 hr tablet Take 1 tablet (100 mg total) by mouth daily. 30 tablet 11  . nitroGLYCERIN (NITROLINGUAL) 0.4 MG/SPRAY spray Place 1 spray under the tongue every 5 (five) minutes x 3 doses as needed for chest pain. 12 g 1  . omeprazole (PRILOSEC) 40 MG capsule TAKE 1 CAPSULE BY MOUTH ONCE DAILY 90 capsule 0  . traMADol (ULTRAM) 50 MG tablet Take 2 tablets (100 mg total) by mouth 2 (two) times daily. Do not exceed 4 tablets per day!! 120 tablet 2  . valsartan-hydrochlorothiazide (DIOVAN-HCT) 320-25 MG tablet Take 1 tablet by mouth at bedtime. 30 tablet 11   No current facility-administered medications on file prior to visit.    Past Medical History  Diagnosis Date  . S/P carpal tunnel release   . Vertigo   . Tobacco abuse   . Encounter for long-term (current) use of other medications   . Osteoporosis   . 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 Diabetic peripheral neuropathy ([place])   . Mixed hyperlipidemia   . Primary osteoarthritis of both knees   . Chronic pain syndrome   . Obesity   . Hypertension   . CAD (coronary artery disease)   . Diabetes mellitus type II, controlled ([place])   . Bronchitis, acute   . Diabetes type 2, controlled ([place])   . Cancer [facility])     Renal s/p nephrectomy  . Sleep apnea     does not use CPAP  . Anxiety   . GERD (gastroesophageal reflux disease)     Past Surgical History  Procedure Laterality Date  . Resection of left acoustic neuroma  1999  .  Coronary angioplasty with stent placement  2004    Normal LV function  . Total abdominal hysterectomy w/ bilateral salpingoophorectomy  04/12/1988    For fibroid tumors; [place], [place]  . Carpal tunnel release Right 08/09/1998  . Carpal tunnel release Left 09/08/1998  . Lipoma excision Right     Right foot  . Cesarean section      x3  . Breast lumpectomy with radioactive seed localization Left 11/30/2015    Procedure: BREAST LUMPECTOMY WITH RADIOACTIVE SEED LOCALIZATION;  Surgeon: Erroll Luna, MD;  Location: [place] [facility];  Service: General;  Laterality: Left;    Social History   Social  History  . Marital Status: Divorced    Spouse Name: N/A  . Number of Children: N/A  . Years of Education: N/A   Occupational History  . 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 Smoking status: Current Every Day Smoker -- 0.25 packs/day for 40 years    Types: Cigarettes  . Smokeless tobacco: Never Used     Comment: smoking 4-5 cigs/day  . Alcohol Use: No  . Drug Use: No  . Sexual Activity: Not on file   Other Topics Concern  . Not on file   Social History Narrative   Divorced   Disability mainly for back and chronic intermittent vertigo - since tumor removed - no surgical correction for back   Lives alone, keeps her grandson, 4 yr old daughter is a mother    Family History  Problem Relation Age of Onset  . Leukemia Mother     CLL  . Diabetes type II Mother   . Other Mother     Hypercholesterolemia  . Osteoarthritis Mother   . COPD Mother   . Hypertension Mother   . Hodgkin's lymphoma Father   . Diabetes type II Sister   . Osteoporosis Sister   . Paranoid behavior Brother     Unsure if diagnosed with Schizophrenia  . Diabetes type II Sister   . Hypertension Sister   . Diabetes type II Sister   . Hypertension Sister   . Schizophrenia Sister   . Heart disease Sister     Enlarged heart  . Glaucoma Sister   .  Alcohol abuse Sister   . Glaucoma Sister   . Alcohol abuse Sister   . Bipolar disorder Daughter   . Lupus Daughter     SLE  . Healthy Daughter     Review of Systems  Constitutional: Negative for fever and chills.  Eyes: Negative for visual disturbance.  Respiratory: Positive for cough (with allergies), shortness of breath (if walks very fast) and wheezing (with allergies).   Cardiovascular: Positive for leg swelling (chronic). Negative for chest pain and palpitations.  Gastrointestinal: Negative for nausea, abdominal pain, diarrhea, constipation and blood in stool.  Genitourinary: Negative for dysuria and hematuria.  Musculoskeletal: Positive for back pain.  Neurological: Positive for dizziness (intermittent vertigo). Negative for light-headedness and headaches.  Psychiatric/Behavioral: Negative for dysphoric mood. The patient is nervous/anxious.        Objective:   Filed Vitals:   02/09/16 1302  BP: 158/90  Pulse: 70  Temp: 98.3 F (36.8 C)  Resp: 16   Filed Weights   02/09/16 1302  Weight: 136 lb (61.689 kg)   Body mass index is 24.1 kg/(m^2).   Physical Exam Constitutional: She appears well-developed and well-nourished. No distress.  HENT:  Head: Normocephalic and atraumatic.  Right Ear: External ear normal. Normal ear canal and TM Left Ear: External ear normal.  Normal ear canal and TM Mouth/Throat: Oropharynx is clear and moist.  Eyes: Conjunctivae and EOM are normal.  Neck: Neck supple. No tracheal deviation present. No thyromegaly present.  No carotid bruit  Cardiovascular: Normal rate, regular rhythm and normal heart sounds.   No murmur heard.  No edema. Pulmonary/Chest: Effort normal and breath sounds normal. No respiratory distress. She has no wheezes. She has no rales.  Abdominal: Soft. She exhibits no distension. There is no tenderness.  Lymphadenopathy: She has no cervical adenopathy.  Skin: Skin is warm  and dry. She is not diaphoretic.  Psychiatric:  She has a normal mood and affect. Her behavior is normal.       Assessment & Plan:   See Problem List for Assessment and Plan of chronic medical problems.  F/u in 4 months Follow up in 4 months

## 2016-02-09 NOTE — Assessment & Plan Note (Signed)
Controlled Having some low sugars and sometimes only taking 1/2 dose of metformin Decreased metformin dose to 500 mg twice daily Check a1c, microalbumin

## 2016-02-10 LAB — HEPATITIS C ANTIBODY: HCV Ab: NEGATIVE

## 2016-02-21 ENCOUNTER — Telehealth: Payer: Self-pay | Admitting: Emergency Medicine

## 2016-02-21 NOTE — Telephone Encounter (Signed)
Received refill request for Alendronate, this med was taken off her med list 02/09/16. Please advise if refill is okay

## 2016-02-21 NOTE — Telephone Encounter (Signed)
Do not fill - we stopped medication

## 2016-02-24 ENCOUNTER — Ambulatory Visit (HOSPITAL_BASED_OUTPATIENT_CLINIC_OR_DEPARTMENT_OTHER): Payer: Medicare Other | Admitting: Physical Medicine & Rehabilitation

## 2016-02-24 ENCOUNTER — Encounter: Payer: Medicare Other | Attending: Physical Medicine & Rehabilitation

## 2016-02-24 ENCOUNTER — Encounter: Payer: Self-pay | Admitting: Physical Medicine & Rehabilitation

## 2016-02-24 VITALS — BP 134/64 | HR 66 | Resp 14

## 2016-02-24 DIAGNOSIS — G629 Polyneuropathy, unspecified: Secondary | ICD-10-CM | POA: Diagnosis not present

## 2016-02-24 DIAGNOSIS — M545 Low back pain: Secondary | ICD-10-CM | POA: Insufficient documentation

## 2016-02-24 DIAGNOSIS — M47819 Spondylosis without myelopathy or radiculopathy, site unspecified: Secondary | ICD-10-CM | POA: Diagnosis not present

## 2016-02-24 DIAGNOSIS — E782 Mixed hyperlipidemia: Secondary | ICD-10-CM | POA: Diagnosis not present

## 2016-02-24 DIAGNOSIS — M47817 Spondylosis without myelopathy or radiculopathy, lumbosacral region: Secondary | ICD-10-CM

## 2016-02-24 DIAGNOSIS — M4806 Spinal stenosis, lumbar region: Secondary | ICD-10-CM | POA: Insufficient documentation

## 2016-02-24 DIAGNOSIS — M810 Age-related osteoporosis without current pathological fracture: Secondary | ICD-10-CM | POA: Insufficient documentation

## 2016-02-24 DIAGNOSIS — E669 Obesity, unspecified: Secondary | ICD-10-CM | POA: Insufficient documentation

## 2016-02-24 DIAGNOSIS — I2510 Atherosclerotic heart disease of native coronary artery without angina pectoris: Secondary | ICD-10-CM | POA: Diagnosis not present

## 2016-02-24 DIAGNOSIS — G894 Chronic pain syndrome: Secondary | ICD-10-CM | POA: Insufficient documentation

## 2016-02-24 DIAGNOSIS — Z85528 Personal history of other malignant neoplasm of kidney: Secondary | ICD-10-CM | POA: Insufficient documentation

## 2016-02-24 DIAGNOSIS — F17210 Nicotine dependence, cigarettes, uncomplicated: Secondary | ICD-10-CM | POA: Insufficient documentation

## 2016-02-24 DIAGNOSIS — Z905 Acquired absence of kidney: Secondary | ICD-10-CM | POA: Insufficient documentation

## 2016-02-24 DIAGNOSIS — M1990 Unspecified osteoarthritis, unspecified site: Secondary | ICD-10-CM | POA: Insufficient documentation

## 2016-02-24 DIAGNOSIS — E1142 Type 2 diabetes mellitus with diabetic polyneuropathy: Secondary | ICD-10-CM | POA: Insufficient documentation

## 2016-02-24 NOTE — Patient Instructions (Signed)

## 2016-02-24 NOTE — Progress Notes (Signed)
Bilateral Lumbar L3, L4  medial branch blocks and L 5 dorsal ramus injection under fluoroscopic guidance  Indication: Lumbar pain which is not relieved by medication management or other conservative care and interfering with self-care and mobility.  Informed consent was obtained after describing risks and benefits of the procedure with the patient, this includes bleeding, infection, paralysis and medication side effects.  The patient wishes to proceed and has given written consent.  The patient was placed in prone position.  The lumbar area was marked and prepped with Betadine.  One mL of 1% lidocaine was injected into each of 6 areas into the skin and subcutaneous tissue.  Then a 22-gauge 3.5in spinal needle was inserted targeting the junction of the left S1 superior articular process and sacral ala junction. Needle was advanced under fluoroscopic guidance.  Bone contact was made.  Omnipaque 180 was injected x 0.5 mL demonstrating no intravascular uptake.  Then a solution containing one mL of 4 mg per mL dexamethasone and 3 mL of 2% MPF lidocaine was injected x 0.5 mL.  Then the left L5 superior articular process in transverse process junction was targeted.  Bone contact was made.  Omnipaque 180 was injected x 0.5 mL demonstrating no intravascular uptake. Then a solution containing one mL of 4 mg per mL dexamethasone and 3 mL of 2% MPF lidocaine was injected x 0.5 mL.  Then the left L4 superior articular process in transverse process junction was targeted.  Bone contact was made.  Omnipaque 180 was injected x 0.5 mL demonstrating no intravascular uptake.  Then a solution containing one mL of 4 mg per mL dexamethasone and 3 mL if 2% MPF lidocaine was injected x 0.5 mL.  This same procedure was performed on the right side using the same needle, technique and injectate.  Patient tolerated procedure well.  Post procedure instructions were given.

## 2016-02-24 NOTE — Progress Notes (Signed)
  PROCEDURE RECORD [facility]   Name: GABRIELL DELOERA DOB:[birth year] MRN: [id number]  Date:02/24/2016  Physician: Alysia Penna, MD    Nurse/CMA: Gara Kroner, CMA  Allergies:  Allergies  Allergen Reactions  . Adhesive [Tape] Rash  . Gabapentin Swelling  . Lyrica [Pregabalin] Swelling    Consent Signed: Yes.    Is patient diabetic? Yes.    CBG today? 110  Pregnant: No. LMP: No LMP recorded. Patient has had a hysterectomy. (age 15-55)  Anticoagulants: no Anti-inflammatory: no Antibiotics: no  Procedure: Bilateral Medial Branch Block  Position: Prone Start Time: 1010 End Time: 1021 Fluoro Time: 43  RN/CMA Wood, CMA Wood, CMA    Time 0959 1026    BP 134/64 145/73    Pulse 66 66    Respirations 14 14    O2 Sat 98 100    S/S 6 6    Pain Level 2/10 1/10     D/C home with transportation , patient A & O X 3, D/C instructions reviewed, and sits independently.

## 2016-02-25 ENCOUNTER — Ambulatory Visit
Admission: RE | Admit: 2016-02-25 | Discharge: 2016-02-25 | Disposition: A | Discharge status: Home / Self Care | Payer: Medicare Other | Source: Ambulatory Visit | Attending: Internal Medicine | Admitting: Internal Medicine

## 2016-02-25 DIAGNOSIS — R42 Dizziness and giddiness: Secondary | ICD-10-CM | POA: Diagnosis not present

## 2016-02-25 DIAGNOSIS — D333 Benign neoplasm of cranial nerves: Secondary | ICD-10-CM

## 2016-02-25 DIAGNOSIS — H9192 Unspecified hearing loss, left ear: Secondary | ICD-10-CM | POA: Diagnosis not present

## 2016-02-25 MED ORDER — GADOBENATE DIMEGLUMINE 529 MG/ML IV SOLN
6.0000 mL | Freq: Once | INTRAVENOUS | Status: AC | PRN
  Administered 2016-02-25: 6 mL via INTRAVENOUS

## 2016-03-01 ENCOUNTER — Encounter: Payer: Self-pay | Admitting: Emergency Medicine

## 2016-03-03 ENCOUNTER — Telehealth: Payer: Self-pay | Admitting: Internal Medicine

## 2016-03-03 NOTE — Telephone Encounter (Signed)
Theresa Cline from [facility] called requesting a refill on alendronate (FOSAMAX) 70 MG tablet For the pt. She was told that the medication had been discontinued and would send a note to the provider Requesting the refill. Please f/u

## 2016-03-05 DIAGNOSIS — M1712 Unilateral primary osteoarthritis, left knee: Secondary | ICD-10-CM | POA: Diagnosis not present

## 2016-03-07 NOTE — Telephone Encounter (Signed)
Fosamax was discontinued

## 2016-03-09 NOTE — Telephone Encounter (Signed)
Spoke with [facility] to inform. Stated pt was requesting refill but would call pt to inform.

## 2016-03-10 ENCOUNTER — Other Ambulatory Visit: Payer: Medicare Other

## 2016-03-10 ENCOUNTER — Other Ambulatory Visit: Payer: Self-pay | Admitting: Physical Medicine & Rehabilitation

## 2016-03-15 ENCOUNTER — Ambulatory Visit (INDEPENDENT_AMBULATORY_CARE_PROVIDER_SITE_OTHER)
Admission: RE | Admit: 2016-03-15 | Discharge: 2016-03-15 | Disposition: A | Discharge status: Home / Self Care | Payer: Medicare Other | Source: Ambulatory Visit | Attending: Internal Medicine | Admitting: Internal Medicine

## 2016-03-15 ENCOUNTER — Encounter: Payer: Self-pay | Admitting: Internal Medicine

## 2016-03-15 DIAGNOSIS — M8580 Other specified disorders of bone density and structure, unspecified site: Secondary | ICD-10-CM | POA: Insufficient documentation

## 2016-03-15 DIAGNOSIS — M810 Age-related osteoporosis without current pathological fracture: Secondary | ICD-10-CM

## 2016-03-17 ENCOUNTER — Encounter: Payer: Self-pay | Admitting: Emergency Medicine

## 2016-03-22 DIAGNOSIS — H9042 Sensorineural hearing loss, unilateral, left ear, with unrestricted hearing on the contralateral side: Secondary | ICD-10-CM | POA: Diagnosis not present

## 2016-03-22 DIAGNOSIS — H938X2 Other specified disorders of left ear: Secondary | ICD-10-CM | POA: Diagnosis not present

## 2016-03-30 ENCOUNTER — Encounter: Payer: Self-pay | Admitting: Registered Nurse

## 2016-03-30 ENCOUNTER — Encounter: Payer: Medicare Other | Attending: Physical Medicine & Rehabilitation | Admitting: Registered Nurse

## 2016-03-30 VITALS — BP 123/71 | HR 69

## 2016-03-30 DIAGNOSIS — M47816 Spondylosis without myelopathy or radiculopathy, lumbar region: Secondary | ICD-10-CM

## 2016-03-30 DIAGNOSIS — M810 Age-related osteoporosis without current pathological fracture: Secondary | ICD-10-CM | POA: Insufficient documentation

## 2016-03-30 DIAGNOSIS — Z905 Acquired absence of kidney: Secondary | ICD-10-CM | POA: Insufficient documentation

## 2016-03-30 DIAGNOSIS — F17210 Nicotine dependence, cigarettes, uncomplicated: Secondary | ICD-10-CM | POA: Insufficient documentation

## 2016-03-30 DIAGNOSIS — M545 Low back pain: Secondary | ICD-10-CM | POA: Diagnosis not present

## 2016-03-30 DIAGNOSIS — M4806 Spinal stenosis, lumbar region: Secondary | ICD-10-CM | POA: Diagnosis not present

## 2016-03-30 DIAGNOSIS — G629 Polyneuropathy, unspecified: Secondary | ICD-10-CM | POA: Diagnosis not present

## 2016-03-30 DIAGNOSIS — M48061 Spinal stenosis, lumbar region without neurogenic claudication: Secondary | ICD-10-CM

## 2016-03-30 DIAGNOSIS — E669 Obesity, unspecified: Secondary | ICD-10-CM | POA: Diagnosis not present

## 2016-03-30 DIAGNOSIS — Z85528 Personal history of other malignant neoplasm of kidney: Secondary | ICD-10-CM | POA: Insufficient documentation

## 2016-03-30 DIAGNOSIS — E1142 Type 2 diabetes mellitus with diabetic polyneuropathy: Secondary | ICD-10-CM | POA: Diagnosis not present

## 2016-03-30 DIAGNOSIS — I2510 Atherosclerotic heart disease of native coronary artery without angina pectoris: Secondary | ICD-10-CM | POA: Insufficient documentation

## 2016-03-30 DIAGNOSIS — E782 Mixed hyperlipidemia: Secondary | ICD-10-CM | POA: Insufficient documentation

## 2016-03-30 DIAGNOSIS — M1990 Unspecified osteoarthritis, unspecified site: Secondary | ICD-10-CM | POA: Insufficient documentation

## 2016-03-30 DIAGNOSIS — G894 Chronic pain syndrome: Secondary | ICD-10-CM | POA: Diagnosis not present

## 2016-03-30 NOTE — Progress Notes (Signed)
Subjective:    Patient ID: Theresa Cline, female    DOB: 1951/07/20, 65 y.o.   MRN: [id number]  HPI: [name] is a 65 year old female who returns for for follow appointment and medication refill. She states her pain is located in her lower back. She rates her pain 3. Her current exercise regime is walking.  S/P MBB with relief noted.  Pain Inventory Average Pain 4 Pain Right Now 3 My pain is burning and aching  In the last 24 hours, has pain interfered with the following? General activity 5 Relation with others 8 Enjoyment of life 10 What TIME of day is your pain at its worst? all times Sleep (in general) Poor  Pain is worse with: bending Pain improves with: rest Relief from Meds: 5  Mobility use a cane ability to climb steps?  no do you drive?  no Do you have any goals in this area?  yes  Function not employed: date last employed 03-1998  Neuro/Psych tingling spasms dizziness anxiety loss of taste or smell  Prior Studies Any changes since last visit?  no  Physicians involved in your care Any changes since last visit?  no   Family History  Problem Relation Age of Onset  . Leukemia Mother     CLL  . Diabetes type II Mother   . Other Mother     Hypercholesterolemia  . Osteoarthritis Mother   . COPD Mother   . Hypertension Mother   . Hodgkin's lymphoma Father   . Diabetes type II Sister   . Osteoporosis Sister   . Paranoid behavior Brother     Unsure if diagnosed with Schizophrenia  . Diabetes type II Sister   . Hypertension Sister   . Diabetes type II Sister   . Hypertension Sister   . Schizophrenia Sister   . Heart disease Sister     Enlarged heart  . Glaucoma Sister   . Alcohol abuse Sister   . Glaucoma Sister   . Alcohol abuse Sister   . Bipolar disorder Daughter   . Lupus Daughter     SLE  . Healthy Daughter    Social History   Social History  . Marital Status: Divorced    Spouse Name: N/A  . Number of Children: N/A   . Years of Education: N/A   Occupational History  . 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 Smoking status: Current Every Day Smoker -- 0.25 packs/day for 40 years    Types: Cigarettes  . Smokeless tobacco: Never Used     Comment: smoking 4-5 cigs/day  . Alcohol Use: No  . Drug Use: No  . Sexual Activity: Not Asked   Other Topics Concern  . None   Social History Narrative   Divorced   Disability mainly for back and chronic intermittent vertigo - since tumor removed - no surgical correction for back   Lives alone, keeps her grandson, 37 yr old daughter is a mother   Past Surgical History  Procedure Laterality Date  . Resection of left acoustic neuroma  1999  . Coronary angioplasty with stent placement  2004    Normal LV function  . Total abdominal hysterectomy w/ bilateral salpingoophorectomy  04/12/1988    For fibroid tumors; [place], [place]  . Carpal tunnel release Right 08/09/1998  . Carpal tunnel release Left 09/08/1998  . Lipoma excision Right  Right foot  . Cesarean section      x3  . Breast lumpectomy with radioactive seed localization Left 11/30/2015    Procedure: BREAST LUMPECTOMY WITH RADIOACTIVE SEED LOCALIZATION;  Surgeon: Erroll Luna, MD;  Location: [facility];  Service: General;  Laterality: Left;   Past Medical History  Diagnosis Date  . S/P carpal tunnel release   . Vertigo   . Tobacco abuse   . Encounter for long-term (current) use of other medications   . Osteoporosis   . 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 Diabetic peripheral neuropathy ([place])   . Mixed hyperlipidemia   . Primary osteoarthritis of both knees   . Chronic pain syndrome   . Obesity   . Hypertension   . CAD (coronary artery disease)   . Diabetes mellitus type II, controlled ([place])   . Bronchitis, acute   . Diabetes type 2, controlled ([place])   . Cancer [facility])     Renal s/p nephrectomy  . Sleep apnea      does not use CPAP  . Anxiety   . GERD (gastroesophageal reflux disease)    BP 123/71 mmHg  Pulse 69  SpO2 98%  Opioid Risk Score:   Fall Risk Score:  `1  Depression screen PHQ 2/9  Depression screen [facility] 2/9 05/06/2015 09/25/2014  Decreased Interest 0 0  Down, Depressed, Hopeless 0 0  PHQ - 2 Score 0 0  Altered sleeping 3 -  Tired, decreased energy 0 -  Change in appetite 3 -  Feeling bad or failure about yourself  0 -  Trouble concentrating 2 -  Moving slowly or fidgety/restless 0 -  Suicidal thoughts 0 -  PHQ-9 Score 8 -     Review of Systems  Constitutional: Negative.   HENT: Negative.   Eyes: Negative.   Respiratory: Negative.   Cardiovascular: Negative.   Gastrointestinal: Negative.   Endocrine: Negative.   Genitourinary: Negative.   Musculoskeletal: Positive for back pain.  Skin: Negative.   Allergic/Immunologic: Negative.   Neurological: Negative.   Hematological: Negative.   Psychiatric/Behavioral: Negative.        Objective:   Physical Exam  Constitutional: She is oriented to person, place, and time. She appears well-developed and well-nourished.  HENT:  Head: Normocephalic and atraumatic.  Neck: Normal range of motion. Neck supple.  Cardiovascular: Normal rate and regular rhythm.   Pulmonary/Chest: Effort normal and breath sounds normal.  Musculoskeletal:  Normal Muscle Bulk and Muscle Testing Reveals: Upper Extremities: Full ROM and Muscle Strength 5/5 Lumbar Hypersensitivity: L-3- L-5 Lower Extremities: Full ROM and Muscle Strength 5/5 Arises from chair with ease using 4 prong walker for support Narrow based Gait  Neurological: She is alert and oriented to person, place, and time.  Skin: Skin is warm and dry.  Psychiatric: She has a normal mood and affect.  Nursing note and vitals reviewed.         Assessment & Plan:  1. Lumbar spinal stenosis without signs of radiculopathy or myelopathy. Continue Tramadol 50 mg tablets take 2 tablets  by mouth twice a day (100 mg). Do not exceed 4 tablets a day #120.  2. 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 S/P medial branch block with good relief noted. 3. Diabetes with neuropathy: Continue Lidocaine and  Cymbalta. Continue to monitor.  20 minutes of face to face patient care time was spent during this visit. All questions was encouraged and answered.  F/U in 2  months

## 2016-03-31 ENCOUNTER — Ambulatory Visit: Payer: Medicare Other | Admitting: Physical Medicine & Rehabilitation

## 2016-04-06 ENCOUNTER — Ambulatory Visit: Payer: Medicare Other | Admitting: Physical Medicine & Rehabilitation

## 2016-04-19 ENCOUNTER — Telehealth: Payer: Self-pay | Admitting: *Deleted

## 2016-04-19 MED ORDER — TRAMADOL HCL 50 MG PO TABS: 100.0000 mg | ORAL_TABLET | Freq: Two times a day (BID) | ORAL | Status: DC

## 2016-04-19 NOTE — Telephone Encounter (Signed)
rec'd refill request by faX

## 2016-05-01 ENCOUNTER — Telehealth: Payer: Self-pay | Admitting: Emergency Medicine

## 2016-05-01 NOTE — Telephone Encounter (Signed)
Please advise 

## 2016-05-01 NOTE — Telephone Encounter (Signed)
mucinex

## 2016-05-01 NOTE — Telephone Encounter (Signed)
Pt wants to know if Dr Quay Burow recommends anything for congestion. Please follow up thanks.

## 2016-05-02 NOTE — Telephone Encounter (Signed)
Tried calling pt, unable to get answer.

## 2016-05-05 ENCOUNTER — Other Ambulatory Visit: Payer: Self-pay | Admitting: Physical Medicine & Rehabilitation

## 2016-05-05 ENCOUNTER — Other Ambulatory Visit: Payer: Self-pay | Admitting: Family Medicine

## 2016-05-06 NOTE — Telephone Encounter (Signed)
Rx Request 

## 2016-05-10 ENCOUNTER — Ambulatory Visit: Payer: Medicare Other | Admitting: Podiatry

## 2016-05-29 ENCOUNTER — Ambulatory Visit: Payer: Medicare Other | Admitting: Physical Medicine & Rehabilitation

## 2016-06-07 ENCOUNTER — Encounter: Payer: Medicare Other | Admitting: Internal Medicine

## 2016-06-09 ENCOUNTER — Ambulatory Visit: Payer: Medicare Other | Admitting: Physical Medicine & Rehabilitation

## 2016-06-14 ENCOUNTER — Ambulatory Visit: Payer: Medicare Other | Admitting: Internal Medicine

## 2016-06-15 ENCOUNTER — Other Ambulatory Visit: Payer: Self-pay | Admitting: Physical Medicine & Rehabilitation

## 2016-06-19 ENCOUNTER — Ambulatory Visit (INDEPENDENT_AMBULATORY_CARE_PROVIDER_SITE_OTHER): Payer: Medicare Other

## 2016-06-19 ENCOUNTER — Ambulatory Visit (HOSPITAL_COMMUNITY)
Admission: EM | Admit: 2016-06-19 | Discharge: 2016-06-19 | Disposition: A | Discharge status: Home / Self Care | Payer: Medicare Other | Attending: Family Medicine | Admitting: Family Medicine

## 2016-06-19 DIAGNOSIS — M25571 Pain in right ankle and joints of right foot: Secondary | ICD-10-CM

## 2016-06-19 DIAGNOSIS — M79671 Pain in right foot: Secondary | ICD-10-CM

## 2016-06-19 DIAGNOSIS — M7989 Other specified soft tissue disorders: Secondary | ICD-10-CM | POA: Diagnosis not present

## 2016-06-19 DIAGNOSIS — M19071 Primary osteoarthritis, right ankle and foot: Secondary | ICD-10-CM | POA: Diagnosis not present

## 2016-06-19 NOTE — ED Provider Notes (Signed)
CSN: [id number]     Arrival date & time 06/19/16  1226 History   First MD Initiated Contact with Patient 06/19/16 1418     Chief Complaint  Patient presents with  . Ankle Pain   (Consider location/radiation/quality/duration/timing/severity/associated sxs/prior Treatment) HPI Theresa Cline is a 65 y.o. female presenting to UC with c/o gradually worsening Right ankle pain that started about 4 days ago when she got out of bed. Denies known injury.  Pain is aching and sharp, worse with palpation along medial aspect of ankle into arch of foot.  Pain is moderate severity. No relief with OTC ankle compression wrap.  Denies prior surgery or injury to ankle.  She does use a walker normally to help with ambulation. Accompanied by family member.    Past Medical History:  Diagnosis Date  . Anxiety   . Bronchitis, acute   . CAD (coronary artery disease)   . Cancer [facility])    Renal s/p nephrectomy  . Chronic pain syndrome   . Diabetes mellitus type II, controlled ([place])   . Diabetes type 2, controlled ([place])   . Diabetic peripheral neuropathy ([place])   . Encounter for long-term (current) use of other medications   . GERD (gastroesophageal reflux disease)   . Hypertension   . 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 Mixed hyperlipidemia   . Obesity   . Osteoporosis   . Primary osteoarthritis of both knees   . S/P carpal tunnel release   . Sleep apnea    does not use CPAP  . Tobacco abuse   . Vertigo    Past Surgical History:  Procedure Laterality Date  . BREAST LUMPECTOMY WITH RADIOACTIVE SEED LOCALIZATION Left 11/30/2015   Procedure: BREAST LUMPECTOMY WITH RADIOACTIVE SEED LOCALIZATION;  Surgeon: Erroll Luna, MD;  Location: [facility];  Service: General;  Laterality: Left;  . CARPAL TUNNEL RELEASE Right 08/09/1998  . CARPAL TUNNEL RELEASE Left 09/08/1998  . CESAREAN SECTION     x3  . CORONARY ANGIOPLASTY WITH STENT PLACEMENT  2004   Normal LV function  . LIPOMA  EXCISION Right    Right foot  . Resection of Left Acoustic Neuroma  1999  . TOTAL ABDOMINAL HYSTERECTOMY W/ BILATERAL SALPINGOOPHORECTOMY  04/12/1988   For fibroid tumors; [place], [place]   Family History  Problem Relation Age of Onset  . Leukemia Mother     CLL  . Diabetes type II Mother   . Other Mother     Hypercholesterolemia  . Osteoarthritis Mother   . COPD Mother   . Hypertension Mother   . Hodgkin's lymphoma Father   . Diabetes type II Sister   . Osteoporosis Sister   . Paranoid behavior Brother     Unsure if diagnosed with Schizophrenia  . Diabetes type II Sister   . Hypertension Sister   . Diabetes type II Sister   . Hypertension Sister   . Schizophrenia Sister   . Heart disease Sister     Enlarged heart  . Glaucoma Sister   . Alcohol abuse Sister   . Glaucoma Sister   . Alcohol abuse Sister   . Bipolar disorder Daughter   . Lupus Daughter     SLE  . Healthy Daughter    Social History  Substance Use Topics  . Smoking status: Current Every Day Smoker    Packs/day: 0.25    Years: 40.00    Types: Cigarettes  . Smokeless tobacco: Never Used  Comment: smoking 4-5 cigs/day  . Alcohol use No   OB History    No data available     Review of Systems  Constitutional: Negative for chills and fever.  Musculoskeletal: Positive for arthralgias, joint swelling and myalgias.       Right ankle and foot  Skin: Negative for color change, rash and wound.  Neurological: Negative for weakness and numbness.    Allergies  Adhesive [tape]; Gabapentin; and Lyrica [pregabalin]  Home Medications   Prior to Admission medications   Medication Sig Start Date End Date Taking? Authorizing Provider  ACCU-CHEK FASTCLIX LANCETS MISC USE TO TEST BLOOD SUGAR THREE TIMES A DAY AND EVERY NIGHT AT BEDTIME 10/21/15   Josalyn Funches, MD  ACCU-CHEK SMARTVIEW test strip USE TO TEST BLOOD SUGAR THREE TIMES A DAY AND EVERY NIGHT AT BEDTIME 10/21/15   Josalyn Funches, MD  albuterol  (PROAIR HFA) 108 (90 BASE) MCG/ACT inhaler Inhale 2 puffs by mouth every 6 hours as needed for wheezing or SOB. Please schedule an office visit for more refills. 10/01/15   Boykin Nearing, MD  aspirin EC 81 MG tablet Take 81 mg by mouth daily.    Historical Provider, MD  atorvastatin (LIPITOR) 40 MG tablet TAKE 1 TABLET BY MOUTH ONCE DAILY 05/08/16   Binnie Rail, MD  Cholecalciferol (VITAMIN D) 2000 UNITS CAPS Take 2,000 Units by mouth daily.    Historical Provider, MD  DULoxetine (CYMBALTA) 30 MG capsule TAKE 1 CAPSULE BY MOUTH EVERY DAY 06/16/16   Charlett Blake, MD  hydrALAZINE (APRESOLINE) 50 MG tablet Take 1 tablet (50 mg total) by mouth 3 (three) times daily. 09/23/15   Burnell Blanks, MD  ipratropium (ATROVENT) 0.06 % nasal spray INSTILL 2 SPRAYS IN EACH NOSTRIL FOUR TIMES A DAY 11/12/15   Josalyn Funches, MD  lidocaine (XYLOCAINE) 5 % ointment APPLY TOPICALLY THREE TIMES A DAY AS NEEDED 05/08/16   Charlett Blake, MD  metFORMIN (GLUCOPHAGE) 500 MG tablet Take 1 tablet (500 mg total) by mouth 2 (two) times daily with a meal. 02/09/16   Binnie Rail, MD  metoprolol succinate (TOPROL-XL) 100 MG 24 hr tablet Take 1 tablet (100 mg total) by mouth daily. 09/23/15   Burnell Blanks, MD  nitroGLYCERIN (NITROLINGUAL) 0.4 MG/SPRAY spray Place 1 spray under the tongue every 5 (five) minutes x 3 doses as needed for chest pain. 10/12/14   Lorayne Marek, MD  omeprazole (PRILOSEC) 20 MG capsule Take 1 capsule (20 mg total) by mouth daily. 02/09/16   Binnie Rail, MD  traMADol (ULTRAM) 50 MG tablet Take 2 tablets (100 mg total) by mouth 2 (two) times daily. 04/19/16   Charlett Blake, MD  valsartan-hydrochlorothiazide (DIOVAN-HCT) 320-25 MG tablet Take 1 tablet by mouth at bedtime. 09/23/15   Burnell Blanks, MD   Meds Ordered and Administered this Visit  Medications - No data to display  BP 115/73 (BP Location: Left Arm)   Pulse 73   Temp 98.1 F (36.7 C) (Oral)   Resp 16    SpO2 100%  No data found.   Physical Exam  Constitutional: She is oriented to person, place, and time. She appears well-developed and well-nourished. No distress.  Elderly female sitting in exam chair, NAD. Walking cane by her side.  HENT:  Head: Normocephalic and atraumatic.  Eyes: EOM are normal.  Neck: Normal range of motion.  Cardiovascular: Normal rate.   Pulses:      Dorsalis pedis pulses are 2+ on  the right side.  Pulmonary/Chest: Effort normal.  Musculoskeletal: Normal range of motion. She exhibits edema and tenderness.  Right ankle and foot: mild edema to ankle, tenderness along medial aspect and arch of foot. Full ROM. No crepitus. Calf is soft, non-tender.  Neurological: She is alert and oriented to person, place, and time.  Skin: Skin is warm and dry. Capillary refill takes less than 2 seconds. She is not diaphoretic.  Right ankle and foot: skin in tact, no ecchymosis or erythema.  Psychiatric: She has a normal mood and affect. Her behavior is normal.  Nursing note and vitals reviewed.   Urgent Care Course   Clinical Course    Procedures (including critical care time)  Labs Review Labs Reviewed - No data to display  Imaging Review Dg Ankle Complete Right  Result Date: 06/19/2016 CLINICAL DATA:  65 y/o F; right ankle pain starting from the medial malleolus extending to mid medial plantar surface of the right foot. History of diabetic neuropathy in both feet. EXAM: RIGHT ANKLE - COMPLETE 3+ VIEW COMPARISON:  06/16/2014 right ankle radiographs. Concurrent right foot radiographs. FINDINGS: There is no evidence of fracture, dislocation, or joint effusion. There is no evidence of arthropathy or other focal bone abnormality. Soft tissues are unremarkable. No acute fracture or dislocation is identified. Ankle joint and intertarsal joint degenerative changes mildly progressed at the talonavicular joint. Small plantar and dorsal calcaneal enthesophytes. Soft tissue swelling  about the ankle joint is also increased. IMPRESSION: 1. No acute osseous abnormality is identified. Increased soft tissue swelling about the ankle joint. 2. Degenerative changes of the ankle joint and intertarsal joints. Electronically Signed   By: Kristine Garbe M.D.   On: 06/19/2016 14:49   Dg Foot Complete Right  Result Date: 06/19/2016 CLINICAL DATA:  64 y/o F; pain from the medial malleolus to the plantar medial foot. EXAM: RIGHT FOOT COMPLETE - 3+ VIEW COMPARISON:  Concurrent right ankle radiographs. FINDINGS: No acute fracture or dislocation is identified. Lisfranc alignment is preserved. Degenerative changes of the first metatarsophalangeal joint with joint space narrowing, osteophytes, and sclerosis of the articular surfaces. Chronic appearing deformity of the fifth proximal phalanx, probably related to old trauma. Small dorsal and plantar calcaneal enthesophytes. Soft tissue swelling about the ankle. IMPRESSION: No acute fracture or dislocation is identified. Soft tissue swelling about the ankle. Electronically Signed   By: Kristine Garbe M.D.   On: 06/19/2016 14:52     MDM   1. Osteoarthritis of right ankle, unspecified osteoarthritis type   2. Right ankle pain   3. Right foot pain    Medical records reviewed, pt has hx of chronic pain and diabetic neuropathy. Pt does have tramadol at home for pain. No evidence of underlying infection of ankle or foot, doubt gout.  Plain films: no acute abnormality. Increased swelling along ankle joint, Degenerative changes of ankle joint and intertarsal joints.  Discussed imaging with pt and family member. ASO ankle splint applied for comfort. May continue to take tramadol as previously prescribed by another provided for pain. Encouraged rest, ice, compression and elevation.  Encouraged f/u with PCP in 1-2 weeks for recheck of symptoms.    Noland Fordyce, PA-C 06/19/16 1541

## 2016-06-19 NOTE — ED Triage Notes (Signed)
C/o right ankle pain Denies any injury Elevated ankle as tx

## 2016-06-19 NOTE — Discharge Instructions (Signed)
°  There was no signs of broken bones or dislocated bones in your ankle or foot but there was some swelling which could indicate a mild ankle sprain.    Please use information in this packet to help with ankle pain.  You may take your Tramadol as prescribed for pain by a previous provided. Be sure to follow up with your primary care provider for ongoing healthcare needs and recheck of symptoms in 1-2 weeks if not improving.

## 2016-06-23 ENCOUNTER — Ambulatory Visit (HOSPITAL_BASED_OUTPATIENT_CLINIC_OR_DEPARTMENT_OTHER): Payer: Medicare Other | Admitting: Physical Medicine & Rehabilitation

## 2016-06-23 ENCOUNTER — Encounter: Payer: Self-pay | Admitting: Physical Medicine & Rehabilitation

## 2016-06-23 ENCOUNTER — Encounter: Payer: Medicare Other | Attending: Physical Medicine & Rehabilitation

## 2016-06-23 VITALS — BP 90/57 | HR 73 | Resp 14

## 2016-06-23 DIAGNOSIS — M19071 Primary osteoarthritis, right ankle and foot: Secondary | ICD-10-CM | POA: Diagnosis not present

## 2016-06-23 DIAGNOSIS — G894 Chronic pain syndrome: Secondary | ICD-10-CM | POA: Insufficient documentation

## 2016-06-23 DIAGNOSIS — E669 Obesity, unspecified: Secondary | ICD-10-CM | POA: Insufficient documentation

## 2016-06-23 DIAGNOSIS — E1142 Type 2 diabetes mellitus with diabetic polyneuropathy: Secondary | ICD-10-CM | POA: Diagnosis not present

## 2016-06-23 DIAGNOSIS — M4806 Spinal stenosis, lumbar region: Secondary | ICD-10-CM | POA: Insufficient documentation

## 2016-06-23 DIAGNOSIS — F17210 Nicotine dependence, cigarettes, uncomplicated: Secondary | ICD-10-CM | POA: Insufficient documentation

## 2016-06-23 DIAGNOSIS — I2510 Atherosclerotic heart disease of native coronary artery without angina pectoris: Secondary | ICD-10-CM | POA: Diagnosis not present

## 2016-06-23 DIAGNOSIS — G629 Polyneuropathy, unspecified: Secondary | ICD-10-CM | POA: Diagnosis not present

## 2016-06-23 DIAGNOSIS — M1990 Unspecified osteoarthritis, unspecified site: Secondary | ICD-10-CM | POA: Insufficient documentation

## 2016-06-23 DIAGNOSIS — Z85528 Personal history of other malignant neoplasm of kidney: Secondary | ICD-10-CM | POA: Diagnosis not present

## 2016-06-23 DIAGNOSIS — M810 Age-related osteoporosis without current pathological fracture: Secondary | ICD-10-CM | POA: Diagnosis not present

## 2016-06-23 DIAGNOSIS — E782 Mixed hyperlipidemia: Secondary | ICD-10-CM | POA: Insufficient documentation

## 2016-06-23 DIAGNOSIS — M545 Low back pain: Secondary | ICD-10-CM | POA: Diagnosis not present

## 2016-06-23 DIAGNOSIS — Z905 Acquired absence of kidney: Secondary | ICD-10-CM | POA: Insufficient documentation

## 2016-06-23 DIAGNOSIS — M48061 Spinal stenosis, lumbar region without neurogenic claudication: Secondary | ICD-10-CM

## 2016-06-23 MED ORDER — TRAMADOL HCL 50 MG PO TABS: 100.0000 mg | ORAL_TABLET | Freq: Two times a day (BID) | ORAL | 5 refills | Status: DC

## 2016-06-23 MED ORDER — DICLOFENAC SODIUM 1 % TD GEL: 2.0000 g | Freq: Four times a day (QID) | TRANSDERMAL | 2 refills | Status: DC

## 2016-06-23 MED ORDER — DULOXETINE HCL 30 MG PO CPEP: ORAL_CAPSULE | ORAL | 5 refills | Status: DC

## 2016-06-23 NOTE — Progress Notes (Signed)
Subjective:    Patient ID: Theresa Cline, female    DOB: [birth year], 65 y.o.   MRN: [id number]  HPI Chief complaint is right greater than left ankle pain no recent trauma. patient completed a trip to [place] and [place] No erythema or fevers, no other joint pain or swelling Evaluated and xrays performed in urgent care Felt to have arthritis Pain Inventory Average Pain 10 Pain Right Now 10 My pain is sharp, burning and aching  In the last 24 hours, has pain interfered with the following? General activity 10 Relation with others 10 Enjoyment of life 10 What TIME of day is your pain at its worst? all Sleep (in general) Poor  Pain is worse with: walking, inactivity and standing Pain improves with: rest, heat/ice and medication Relief from Meds: 0  Mobility walk with assistance use a cane ability to climb steps?  no do you drive?  no  Function I need assistance with the following:  meal prep and household duties  Neuro/Psych weakness trouble walking spasms dizziness anxiety loss of taste or smell  Prior Studies Any changes since last visit?  no  Physicians involved in your care Any changes since last visit?  no   Family History  Problem Relation Age of Onset  . Leukemia Mother     CLL  . Diabetes type II Mother   . Other Mother     Hypercholesterolemia  . Osteoarthritis Mother   . COPD Mother   . Hypertension Mother   . Hodgkin's lymphoma Father   . Diabetes type II Sister   . Osteoporosis Sister   . Paranoid behavior Brother     Unsure if diagnosed with Schizophrenia  . Diabetes type II Sister   . Hypertension Sister   . Diabetes type II Sister   . Hypertension Sister   . Schizophrenia Sister   . Heart disease Sister     Enlarged heart  . Glaucoma Sister   . Alcohol abuse Sister   . Glaucoma Sister   . Alcohol abuse Sister   . Bipolar disorder Daughter   . Lupus Daughter     SLE  . Healthy Daughter    Social History   Social  History  . Marital status: Divorced    Spouse name: N/A  . Number of children: N/A  . Years of education: N/A   Occupational History  . 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 Smoking status: Current Every Day Smoker    Packs/day: 0.25    Years: 40.00    Types: Cigarettes  . Smokeless tobacco: Never Used     Comment: smoking 4-5 cigs/day  . Alcohol use No  . Drug use: No  . Sexual activity: Not Asked   Other Topics Concern  . None   Social History Narrative   Divorced   Disability mainly for back and chronic intermittent vertigo - since tumor removed - no surgical correction for back   Lives alone, keeps her grandson, 24 yr old daughter is a mother   Past Surgical History:  Procedure Laterality Date  . BREAST LUMPECTOMY WITH RADIOACTIVE SEED LOCALIZATION Left 11/30/2015   Procedure: BREAST LUMPECTOMY WITH RADIOACTIVE SEED LOCALIZATION;  Surgeon: Erroll Luna, MD;  Location: [facility];  Service: General;  Laterality: Left;  . CARPAL TUNNEL RELEASE Right 08/09/1998  . CARPAL TUNNEL RELEASE Left 09/08/1998  . CESAREAN SECTION  x3  . CORONARY ANGIOPLASTY WITH STENT PLACEMENT  2004   Normal LV function  . LIPOMA EXCISION Right    Right foot  . Resection of Left Acoustic Neuroma  1999  . TOTAL ABDOMINAL HYSTERECTOMY W/ BILATERAL SALPINGOOPHORECTOMY  04/12/1988   For fibroid tumors; [place], [place]   Past Medical History:  Diagnosis Date  . Anxiety   . Bronchitis, acute   . CAD (coronary artery disease)   . Cancer [facility])    Renal s/p nephrectomy  . Chronic pain syndrome   . Diabetes mellitus type II, controlled ([place])   . Diabetes type 2, controlled ([place])   . Diabetic peripheral neuropathy ([place])   . Encounter for long-term (current) use of other medications   . GERD (gastroesophageal reflux disease)   . Hypertension   . 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 Mixed hyperlipidemia   . Obesity     . Osteoporosis   . Primary osteoarthritis of both knees   . S/P carpal tunnel release   . Sleep apnea    does not use CPAP  . Tobacco abuse   . Vertigo    BP (!) 90/57 (BP Location: Left Arm, Patient Position: Sitting, Cuff Size: Normal)   Pulse 73   Resp 14   SpO2 96%   Opioid Risk Score:   Fall Risk Score:  `1  Depression screen PHQ 2/9  Depression screen [facility] 2/9 05/06/2015 09/25/2014  Decreased Interest 0 0  Down, Depressed, Hopeless 0 0  PHQ - 2 Score 0 0  Altered sleeping 3 -  Tired, decreased energy 0 -  Change in appetite 3 -  Feeling bad or failure about yourself  0 -  Trouble concentrating 2 -  Moving slowly or fidgety/restless 0 -  Suicidal thoughts 0 -  PHQ-9 Score 8 -  Some recent data might be hidden    Review of Systems  Constitutional: Positive for appetite change.  Respiratory: Positive for apnea and shortness of breath.   Cardiovascular: Negative.   Gastrointestinal: Negative.   Musculoskeletal: Positive for arthralgias, back pain, gait problem and neck pain.       Spoasms  Skin: Negative.   Neurological: Positive for dizziness, weakness and numbness.       Objective:   Physical Exam  Constitutional: She is oriented to person, place, and time. She appears well-developed and well-nourished.  HENT:  Head: Normocephalic and atraumatic.  Eyes: Conjunctivae and EOM are normal. Pupils are equal, round, and reactive to light.  Musculoskeletal:       Right ankle: She exhibits decreased range of motion and swelling. She exhibits no ecchymosis. Tenderness. Medial malleolus and AITFL tenderness found. Achilles tendon normal.       Left ankle: She exhibits decreased range of motion. Tenderness. Medial malleolus tenderness found. Achilles tendon normal.  Pain over bilateral plantar fascia No erythema in foot/ankle area  Neurological: She is alert and oriented to person, place, and time. Gait abnormal.  Reflex Scores:      Patellar reflexes are 1+ on the  right side and 1+ on the left side.      Achilles reflexes are 0 on the right side and 0 on the left side. Motor 4/5 bilateral ankle DF and PF Antalgic gait   Psychiatric: She has a normal mood and affect.  Nursing note and vitals reviewed.         Assessment & Plan:  1.  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  Also has some evidence of plantar pain - poss fasciitis Rec Ice Cont R ankle support Limit ambulation within pain tolerance Do not thinke injection would be very helpful Add voltaren gel  2.  Lumbar spinal stenosis cont tramadol 100mg  BID for pain

## 2016-06-23 NOTE — Patient Instructions (Signed)
May need to repeat back injection if pain worsens

## 2016-07-11 DIAGNOSIS — E103293 Type 1 diabetes mellitus with mild nonproliferative diabetic retinopathy without macular edema, bilateral: Secondary | ICD-10-CM | POA: Diagnosis not present

## 2016-07-11 DIAGNOSIS — H5211 Myopia, right eye: Secondary | ICD-10-CM | POA: Diagnosis not present

## 2016-07-11 LAB — HM DIABETES EYE EXAM

## 2016-07-20 ENCOUNTER — Encounter: Payer: Self-pay | Admitting: Internal Medicine

## 2016-07-27 NOTE — Patient Instructions (Addendum)
A chest xray blood work were ordered.   Test(s) ordered today. Your results will be released to [facility] (or called to you) after review, usually within 72hours after test completion. If any changes need to be made, you will be notified at that same time.   Flu vaccine administered today.   Medications reviewed and updated.  Changes include changing diovan-hctz to 320-12.5 mg daily  Your prescription(s) have been submitted to your pharmacy. Please take as directed and contact our office if you believe you are having problem(s) with the medication(s).  An EKG was ordered  Please followup in 1-2 weeks - sooner if needed

## 2016-07-27 NOTE — Progress Notes (Signed)
Subjective:    Patient ID: Theresa Cline, female    DOB: 14-May-1951, 65 y.o.   MRN: [id number]  HPI She is here for an acute visit.   Tobacco abuse:  She is trying to quit.  She is smoking minimally now.  She finally got a bed that she can sleep in - her old bad was hurting her hips.  She was sleeping on the couch for 4 years.  Since being in the bedroom she is going outside less to smoke - it is less convenient.    GERD:  Her GERD has been acting up.  She is taking the omeprazole 20 mg daily.  Her GERD acts up depending on what she eats.  She has symptoms twice a month.    Body weakness:  She has intermittent whole body weakness.  She has had two episodes.  When this occurs she denies lightheadedness, chest pain, palpitations.  She has needed to lay down and she will fall asleep and when she wakes up it is gone.  She does get anxious about heights and stairs - it makes her dizzy and anxious. Her pain doctor thinks it may be anxiety.  lightheaded:  She gets lightheaded daily for the past couple of months.  It is intermittent throughout the day.  Prior to a couple of months ago it was only coming 4-5 times a year.    SOB:  For the past couple of months she has had some SOB. She denies any symptoms at rest. She has had some SOB with walking or doing activity.  It goes away with rest.    She is not exercising regularly.    Rectal pain:  She has intermittent rectal pain.  It is severe and sharp.  It will sweat and lose consciousness.  She had LOC last in 2015.  She still occasionally has rectal pain a couple of times a year but it has not been as severe recently to make her pass out.   Her sugars have been good.  Her sugar was 141 this morning.  The lowest sugar she has had was 95.  Her sugars have not been over 120.   Medications and allergies reviewed with patient and updated if appropriate.  Patient Active Problem List   Diagnosis Date Noted  . Osteopenia 03/15/2016  . Facet  arthropathy, lumbosacral 02/24/2016  . GERD (gastroesophageal reflux disease) 02/09/2016  . 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 Right knee DJD 01/18/2012  . Trochanteric bursitis of both hips 12/11/2011  . Spinal stenosis, lumbar region, without neurogenic claudication 12/11/2011  . Cancer ([place])   . Syncope 07/17/2011  . EDEMA 07/12/2010  . Dizziness and giddiness 11/06/2008  . Diabetes ([place]) 10/15/2007  . Diabetic neuropathy associated with type 2 diabetes mellitus ([place]) 10/15/2007  . Mixed hyperlipidemia 10/15/2007  . TOBACCO ABUSE 10/15/2007  . Chronic pain syndrome 10/15/2007  . Essential hypertension 10/15/2007  . Coronary atherosclerosis 10/15/2007  . Osteoarthrosis, unspecified whether generalized or localized, involving lower leg 10/15/2007  . CARPAL TUNNEL RELEASE, BILATERAL, HX OF 08/09/1998    Current Outpatient Prescriptions on File Prior to Visit  Medication Sig Dispense Refill  . ACCU-CHEK FASTCLIX LANCETS MISC USE TO TEST BLOOD SUGAR THREE TIMES A DAY AND EVERY NIGHT AT BEDTIME 102 each 1  . ACCU-CHEK SMARTVIEW test strip USE TO TEST BLOOD SUGAR THREE TIMES A DAY AND EVERY NIGHT AT BEDTIME 100 each 1  . albuterol (PROAIR HFA) 108 (90 BASE) MCG/ACT inhaler Inhale  2 puffs by mouth every 6 hours as needed for wheezing or SOB. Please schedule an office visit for more refills. 18 g 0  . aspirin EC 81 MG tablet Take 81 mg by mouth daily.    Marland Kitchen atorvastatin (LIPITOR) 40 MG tablet TAKE 1 TABLET BY MOUTH ONCE DAILY 90 tablet 1  . Cholecalciferol (VITAMIN D) 2000 UNITS CAPS Take 2,000 Units by mouth daily.    . diclofenac sodium (VOLTAREN) 1 % GEL Apply 2 g topically 4 (four) times daily. 3 Tube 2  . DULoxetine (CYMBALTA) 30 MG capsule TAKE 1 CAPSULE BY MOUTH EVERY DAY 30 capsule 5  . hydrALAZINE (APRESOLINE) 50 MG tablet Take 1 tablet (50 mg total) by mouth 3 (three) times daily. 90 tablet 11  . ipratropium (ATROVENT) 0.06 % nasal spray INSTILL 2 SPRAYS IN EACH NOSTRIL FOUR  TIMES A DAY 15 mL 2  . lidocaine (XYLOCAINE) 5 % ointment APPLY TOPICALLY THREE TIMES A DAY AS NEEDED 100 g 1  . metFORMIN (GLUCOPHAGE) 500 MG tablet Take 1 tablet (500 mg total) by mouth 2 (two) times daily with a meal. 180 tablet 3  . metoprolol succinate (TOPROL-XL) 100 MG 24 hr tablet Take 1 tablet (100 mg total) by mouth daily. 30 tablet 11  . nitroGLYCERIN (NITROLINGUAL) 0.4 MG/SPRAY spray Place 1 spray under the tongue every 5 (five) minutes x 3 doses as needed for chest pain. 12 g 1  . omeprazole (PRILOSEC) 20 MG capsule Take 1 capsule (20 mg total) by mouth daily. 90 capsule 3  . traMADol (ULTRAM) 50 MG tablet Take 2 tablets (100 mg total) by mouth 2 (two) times daily. 120 tablet 5  . valsartan-hydrochlorothiazide (DIOVAN-HCT) 320-25 MG tablet Take 1 tablet by mouth at bedtime. 30 tablet 11   No current facility-administered medications on file prior to visit.     Past Medical History:  Diagnosis Date  . Anxiety   . Bronchitis, acute   . CAD (coronary artery disease)   . Cancer [facility])    Renal s/p nephrectomy  . Chronic pain syndrome   . Diabetes mellitus type II, controlled ([place])   . Diabetes type 2, controlled ([place])   . Diabetic peripheral neuropathy ([place])   . Encounter for long-term (current) use of other medications   . GERD (gastroesophageal reflux disease)   . Hypertension   . 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 Mixed hyperlipidemia   . Obesity   . Osteoporosis   . Primary osteoarthritis of both knees   . S/P carpal tunnel release   . Sleep apnea    does not use CPAP  . Tobacco abuse   . Vertigo     Past Surgical History:  Procedure Laterality Date  . BREAST LUMPECTOMY WITH RADIOACTIVE SEED LOCALIZATION Left 11/30/2015   Procedure: BREAST LUMPECTOMY WITH RADIOACTIVE SEED LOCALIZATION;  Surgeon: Erroll Luna, MD;  Location: [facility];  Service: General;  Laterality: Left;  . CARPAL TUNNEL RELEASE Right 08/09/1998  . CARPAL TUNNEL RELEASE  Left 09/08/1998  . CESAREAN SECTION     x3  . CORONARY ANGIOPLASTY WITH STENT PLACEMENT  2004   Normal LV function  . LIPOMA EXCISION Right    Right foot  . Resection of Left Acoustic Neuroma  1999  . TOTAL ABDOMINAL HYSTERECTOMY W/ BILATERAL SALPINGOOPHORECTOMY  04/12/1988   For fibroid tumors; [place], [place]    Social History   Social History  . Marital status: Divorced    Spouse name:  N/A  . Number of children: N/A  . Years of education: N/A   Occupational History  . 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 Smoking status: Current Every Day Smoker    Packs/day: 0.25    Years: 40.00    Types: Cigarettes  . Smokeless tobacco: Never Used     Comment: smoking 4-5 cigs/day  . Alcohol use No  . Drug use: No  . Sexual activity: Not on file   Other Topics Concern  . Not on file   Social History Narrative   Divorced   Disability mainly for back and chronic intermittent vertigo - since tumor removed - no surgical correction for back   Lives alone, keeps her grandson, 36 yr old daughter is a mother    Family History  Problem Relation Age of Onset  . Leukemia Mother     CLL  . Diabetes type II Mother   . Other Mother     Hypercholesterolemia  . Osteoarthritis Mother   . COPD Mother   . Hypertension Mother   . Hodgkin's lymphoma Father   . Diabetes type II Sister   . Osteoporosis Sister   . Paranoid behavior Brother     Unsure if diagnosed with Schizophrenia  . Diabetes type II Sister   . Hypertension Sister   . Diabetes type II Sister   . Hypertension Sister   . Schizophrenia Sister   . Heart disease Sister     Enlarged heart  . Glaucoma Sister   . Alcohol abuse Sister   . Glaucoma Sister   . Alcohol abuse Sister   . Bipolar disorder Daughter   . Lupus Daughter     SLE  . Healthy Daughter     Review of Systems  Constitutional: Positive for fatigue. Negative for appetite change, chills, fever and  unexpected weight change.       Episodes of whole body weakness  Respiratory: Positive for shortness of breath (with exertion). Negative for cough and wheezing.   Cardiovascular: Positive for leg swelling (chronic, unchanged). Negative for chest pain and palpitations (just heart racing).  Gastrointestinal: Negative for abdominal pain and blood in stool (no black stool).       Occ GERD  Genitourinary: Negative for hematuria.  Neurological: Positive for dizziness, light-headedness and headaches (occ).       Objective:   Vitals:   07/28/16 0829  BP: (!) 114/52  Pulse: (!) 47  Resp: 16  Temp: 98.2 F (36.8 C)   Filed Weights   07/28/16 0829  Weight: 144 lb (65.3 kg)   Body mass index is 25.51 kg/m.   Physical Exam Constitutional: Appears well-developed and well-nourished. No distress.  HENT:  Head: Normocephalic and atraumatic.  Neck: Neck supple. No tracheal deviation present. No thyromegaly present.  No cervical lymphadenopathy Cardiovascular: Normal rate, regular rhythm and normal heart sounds.   No murmur heard. No carotid bruit .  No edema Pulmonary/Chest: Effort normal and breath sounds normal. No respiratory distress. No has no wheezes. No rales.  Skin: Skin is warm and dry. Not diaphoretic.  Psychiatric: Normal mood and affect. Behavior is normal.           Assessment & Plan:    Flu vaccine today  See Problem List for Assessment and Plan of chronic medical problems.

## 2016-07-28 ENCOUNTER — Other Ambulatory Visit (INDEPENDENT_AMBULATORY_CARE_PROVIDER_SITE_OTHER): Payer: Medicare Other

## 2016-07-28 ENCOUNTER — Encounter (HOSPITAL_COMMUNITY): Payer: Self-pay | Admitting: Emergency Medicine

## 2016-07-28 ENCOUNTER — Inpatient Hospital Stay (HOSPITAL_COMMUNITY)
Admission: EM | Admit: 2016-07-28 | Discharge: 2016-07-30 | DRG: 812 | Disposition: A | Discharge status: Home / Self Care | Payer: Medicare Other | Attending: Internal Medicine | Admitting: Internal Medicine

## 2016-07-28 ENCOUNTER — Ambulatory Visit (INDEPENDENT_AMBULATORY_CARE_PROVIDER_SITE_OTHER): Payer: Medicare Other | Admitting: Internal Medicine

## 2016-07-28 VITALS — BP 114/52 | HR 47 | Temp 98.2°F | Resp 16 | Ht 63.0 in | Wt 144.0 lb

## 2016-07-28 DIAGNOSIS — R42 Dizziness and giddiness: Secondary | ICD-10-CM

## 2016-07-28 DIAGNOSIS — Z833 Family history of diabetes mellitus: Secondary | ICD-10-CM

## 2016-07-28 DIAGNOSIS — E782 Mixed hyperlipidemia: Secondary | ICD-10-CM | POA: Diagnosis not present

## 2016-07-28 DIAGNOSIS — G4730 Sleep apnea, unspecified: Secondary | ICD-10-CM | POA: Diagnosis present

## 2016-07-28 DIAGNOSIS — G894 Chronic pain syndrome: Secondary | ICD-10-CM | POA: Diagnosis not present

## 2016-07-28 DIAGNOSIS — Z8262 Family history of osteoporosis: Secondary | ICD-10-CM | POA: Diagnosis not present

## 2016-07-28 DIAGNOSIS — Z8249 Family history of ischemic heart disease and other diseases of the circulatory system: Secondary | ICD-10-CM | POA: Diagnosis not present

## 2016-07-28 DIAGNOSIS — Z91048 Other nonmedicinal substance allergy status: Secondary | ICD-10-CM

## 2016-07-28 DIAGNOSIS — E876 Hypokalemia: Secondary | ICD-10-CM | POA: Diagnosis present

## 2016-07-28 DIAGNOSIS — Z7982 Long term (current) use of aspirin: Secondary | ICD-10-CM

## 2016-07-28 DIAGNOSIS — I10 Essential (primary) hypertension: Secondary | ICD-10-CM | POA: Diagnosis not present

## 2016-07-28 DIAGNOSIS — D649 Anemia, unspecified: Secondary | ICD-10-CM | POA: Diagnosis not present

## 2016-07-28 DIAGNOSIS — Z905 Acquired absence of kidney: Secondary | ICD-10-CM | POA: Diagnosis not present

## 2016-07-28 DIAGNOSIS — M810 Age-related osteoporosis without current pathological fracture: Secondary | ICD-10-CM | POA: Diagnosis present

## 2016-07-28 DIAGNOSIS — Z85528 Personal history of other malignant neoplasm of kidney: Secondary | ICD-10-CM | POA: Diagnosis not present

## 2016-07-28 DIAGNOSIS — K219 Gastro-esophageal reflux disease without esophagitis: Secondary | ICD-10-CM | POA: Diagnosis not present

## 2016-07-28 DIAGNOSIS — R0609 Other forms of dyspnea: Secondary | ICD-10-CM | POA: Diagnosis not present

## 2016-07-28 DIAGNOSIS — D509 Iron deficiency anemia, unspecified: Secondary | ICD-10-CM | POA: Diagnosis not present

## 2016-07-28 DIAGNOSIS — F17200 Nicotine dependence, unspecified, uncomplicated: Secondary | ICD-10-CM | POA: Diagnosis present

## 2016-07-28 DIAGNOSIS — M170 Bilateral primary osteoarthritis of knee: Secondary | ICD-10-CM | POA: Diagnosis not present

## 2016-07-28 DIAGNOSIS — I2510 Atherosclerotic heart disease of native coronary artery without angina pectoris: Secondary | ICD-10-CM | POA: Diagnosis present

## 2016-07-28 DIAGNOSIS — R0989 Other specified symptoms and signs involving the circulatory and respiratory systems: Secondary | ICD-10-CM | POA: Diagnosis not present

## 2016-07-28 DIAGNOSIS — E1142 Type 2 diabetes mellitus with diabetic polyneuropathy: Secondary | ICD-10-CM

## 2016-07-28 DIAGNOSIS — Z23 Encounter for immunization: Secondary | ICD-10-CM | POA: Diagnosis not present

## 2016-07-28 DIAGNOSIS — Z955 Presence of coronary angioplasty implant and graft: Secondary | ICD-10-CM

## 2016-07-28 DIAGNOSIS — Z7984 Long term (current) use of oral hypoglycemic drugs: Secondary | ICD-10-CM

## 2016-07-28 DIAGNOSIS — E1140 Type 2 diabetes mellitus with diabetic neuropathy, unspecified: Secondary | ICD-10-CM | POA: Diagnosis present

## 2016-07-28 DIAGNOSIS — E118 Type 2 diabetes mellitus with unspecified complications: Secondary | ICD-10-CM

## 2016-07-28 DIAGNOSIS — E669 Obesity, unspecified: Secondary | ICD-10-CM | POA: Diagnosis present

## 2016-07-28 DIAGNOSIS — R7309 Other abnormal glucose: Secondary | ICD-10-CM | POA: Diagnosis not present

## 2016-07-28 DIAGNOSIS — R7889 Finding of other specified substances, not normally found in blood: Secondary | ICD-10-CM | POA: Diagnosis not present

## 2016-07-28 DIAGNOSIS — R0600 Dyspnea, unspecified: Secondary | ICD-10-CM | POA: Diagnosis not present

## 2016-07-28 DIAGNOSIS — F17210 Nicotine dependence, cigarettes, uncomplicated: Secondary | ICD-10-CM | POA: Diagnosis present

## 2016-07-28 DIAGNOSIS — F419 Anxiety disorder, unspecified: Secondary | ICD-10-CM | POA: Diagnosis present

## 2016-07-28 DIAGNOSIS — Z6825 Body mass index (BMI) 25.0-25.9, adult: Secondary | ICD-10-CM

## 2016-07-28 DIAGNOSIS — Z888 Allergy status to other drugs, medicaments and biological substances status: Secondary | ICD-10-CM

## 2016-07-28 HISTORY — DX: Iron deficiency anemia, unspecified: D50.9

## 2016-07-28 LAB — COMPREHENSIVE METABOLIC PANEL
ALK PHOS: 70 U/L (ref 39–117)
ALT: 41 U/L — ABNORMAL HIGH (ref 0–35)
AST: 43 U/L — ABNORMAL HIGH (ref 0–37)
Albumin: 4 g/dL (ref 3.5–5.2)
BUN: 17 mg/dL (ref 6–23)
CALCIUM: 9.3 mg/dL (ref 8.4–10.5)
CO2: 29 mEq/L (ref 19–32)
Chloride: 102 mEq/L (ref 96–112)
Creatinine, Ser: 1 mg/dL (ref 0.40–1.20)
GFR: 70.98 mL/min (ref >60.00)
GLUCOSE: 94 mg/dL (ref 70–99)
POTASSIUM: 3.7 meq/L (ref 3.5–5.1)
Sodium: 140 mEq/L (ref 135–145)
TOTAL PROTEIN: 7.1 g/dL (ref 6.0–8.3)
Total Bilirubin: 0.4 mg/dL (ref 0.2–1.2)

## 2016-07-28 LAB — TSH: TSH: 2.36 u[IU]/mL (ref 0.35–4.50)

## 2016-07-28 LAB — URINALYSIS, ROUTINE W REFLEX MICROSCOPIC
BILIRUBIN URINE: NEGATIVE
Glucose, UA: NEGATIVE mg/dL
HGB URINE DIPSTICK: NEGATIVE
Ketones, ur: NEGATIVE mg/dL
Leukocytes, UA: NEGATIVE
Nitrite: NEGATIVE
PH: 7 (ref 5.0–8.0)
Protein, ur: NEGATIVE mg/dL
SPECIFIC GRAVITY, URINE: 1.015 (ref 1.005–1.030)

## 2016-07-28 LAB — IRON AND TIBC
IRON: 10 ug/dL — AB (ref 28–170)
SATURATION RATIOS: 2 % — AB (ref 10.4–31.8)
TIBC: 494 ug/dL — AB (ref 250–450)
UIBC: 484 ug/dL

## 2016-07-28 LAB — CBC
HEMATOCRIT: 16.1 % — AB (ref 36.0–46.0)
HEMOGLOBIN: 4.3 g/dL — AB (ref 12.0–15.0)
MCH: 17.2 pg — AB (ref 26.0–34.0)
MCHC: 26.7 g/dL — AB (ref 30.0–36.0)
MCV: 64.4 fL — ABNORMAL LOW (ref 78.0–100.0)
Platelets: 228 10*3/uL (ref 150–400)
RBC: 2.5 MIL/uL — AB (ref 3.87–5.11)
RDW: 18.6 % — ABNORMAL HIGH (ref 11.5–15.5)
WBC: 5.4 10*3/uL (ref 4.0–10.5)

## 2016-07-28 LAB — DIFFERENTIAL
BASOS PCT: 1 %
Basophils Absolute: 0.1 10*3/uL (ref 0.0–0.1)
EOS ABS: 0.2 10*3/uL (ref 0.0–0.7)
Eosinophils Relative: 3 %
LYMPHS ABS: 1.8 10*3/uL (ref 0.7–4.0)
LYMPHS PCT: 33 %
MONOS PCT: 12 %
Monocytes Absolute: 0.6 10*3/uL (ref 0.1–1.0)
NEUTROS ABS: 2.7 10*3/uL (ref 1.7–7.7)
NEUTROS PCT: 51 %

## 2016-07-28 LAB — BASIC METABOLIC PANEL
ANION GAP: 10 (ref 5–15)
BUN: 17 mg/dL (ref 6–20)
CHLORIDE: 101 mmol/L (ref 101–111)
CO2: 27 mmol/L (ref 22–32)
Calcium: 9.2 mg/dL (ref 8.9–10.3)
Creatinine, Ser: 1.04 mg/dL — ABNORMAL HIGH (ref 0.44–1.00)
GFR calc non Af Amer: 54 mL/min — ABNORMAL LOW (ref >60)
GLUCOSE: 76 mg/dL (ref 65–99)
POTASSIUM: 3.1 mmol/L — AB (ref 3.5–5.1)
Sodium: 138 mmol/L (ref 135–145)

## 2016-07-28 LAB — FOLATE: Folate: 26.1 ng/mL (ref >5.9)

## 2016-07-28 LAB — GLUCOSE, CAPILLARY
Glucose-Capillary: 63 mg/dL — ABNORMAL LOW (ref 65–99)
Glucose-Capillary: 97 mg/dL (ref 65–99)

## 2016-07-28 LAB — FERRITIN: FERRITIN: 2 ng/mL — AB (ref 11–307)

## 2016-07-28 LAB — POC OCCULT BLOOD, ED: FECAL OCCULT BLD: NEGATIVE

## 2016-07-28 LAB — RETICULOCYTES
RBC.: 2.5 MIL/uL — ABNORMAL LOW (ref 3.87–5.11)
RETIC CT PCT: 1.2 % (ref 0.4–3.1)
Retic Count, Absolute: 30 10*3/uL (ref 19.0–186.0)

## 2016-07-28 LAB — HEMOGLOBIN A1C
Hgb A1c MFr Bld: 5.7 % — ABNORMAL HIGH (ref <5.7)
Mean Plasma Glucose: 117 mg/dL

## 2016-07-28 LAB — CBG MONITORING, ED: GLUCOSE-CAPILLARY: 72 mg/dL (ref 65–99)

## 2016-07-28 LAB — PREPARE RBC (CROSSMATCH)

## 2016-07-28 LAB — CBC WITH DIFFERENTIAL/PLATELET
BASOS ABS: 0.1 10*3/uL (ref 0.0–0.1)
Basophils Relative: 1.3 % (ref 0.0–3.0)
EOS PCT: 2.1 % (ref 0.0–5.0)
Eosinophils Absolute: 0.1 10*3/uL (ref 0.0–0.7)
LYMPHS ABS: 2 10*3/uL (ref 0.7–4.0)
LYMPHS PCT: 28 % (ref 12.0–46.0)
MCHC: 29.5 g/dL — AB (ref 30.0–36.0)
MCV: 60.8 fl — AB (ref 78.0–100.0)
MONOS PCT: 12.2 % — AB (ref 3.0–12.0)
Monocytes Absolute: 0.9 10*3/uL (ref 0.1–1.0)
Neutro Abs: 4 10*3/uL (ref 1.4–7.7)
Neutrophils Relative %: 56.4 % (ref 43.0–77.0)
Platelets: 247 10*3/uL (ref 150.0–400.0)
RBC: 2.58 Mil/uL — AB (ref 3.87–5.11)
RDW: 19.3 % — ABNORMAL HIGH (ref 11.5–15.5)
WBC: 7.1 10*3/uL (ref 4.0–10.5)

## 2016-07-28 LAB — VITAMIN B12: VITAMIN B 12: 454 pg/mL (ref 180–914)

## 2016-07-28 LAB — MAGNESIUM: Magnesium: 1.7 mg/dL (ref 1.7–2.4)

## 2016-07-28 MED ORDER — ACETAMINOPHEN 325 MG PO TABS
650.0000 mg | ORAL_TABLET | Freq: Four times a day (QID) | ORAL | Status: DC | PRN
  Administered 2016-07-30: 650 mg via ORAL
  Filled 2016-07-28 (×2): qty 2

## 2016-07-28 MED ORDER — POTASSIUM CHLORIDE CRYS ER 20 MEQ PO TBCR
40.0000 meq | EXTENDED_RELEASE_TABLET | Freq: Once | ORAL | Status: AC
  Administered 2016-07-28: 40 meq via ORAL
  Filled 2016-07-28: qty 2

## 2016-07-28 MED ORDER — ATORVASTATIN CALCIUM 40 MG PO TABS
40.0000 mg | ORAL_TABLET | Freq: Every day | ORAL | Status: DC
  Administered 2016-07-29 – 2016-07-30 (×2): 40 mg via ORAL
  Filled 2016-07-28 (×2): qty 1

## 2016-07-28 MED ORDER — PANTOPRAZOLE SODIUM 40 MG PO TBEC
40.0000 mg | DELAYED_RELEASE_TABLET | Freq: Every day | ORAL | Status: DC
Start: 2016-07-29 — End: 2016-07-30
  Administered 2016-07-29 – 2016-07-30 (×2): 40 mg via ORAL
  Filled 2016-07-28 (×2): qty 1

## 2016-07-28 MED ORDER — HYDRALAZINE HCL 50 MG PO TABS
50.0000 mg | ORAL_TABLET | Freq: Three times a day (TID) | ORAL | Status: DC
  Administered 2016-07-28 – 2016-07-29 (×4): 50 mg via ORAL
  Filled 2016-07-28 (×4): qty 1

## 2016-07-28 MED ORDER — INSULIN ASPART 100 UNIT/ML ~~LOC~~ SOLN
0.0000 [IU] | Freq: Three times a day (TID) | SUBCUTANEOUS | Status: DC
  Administered 2016-07-29: 2 [IU] via SUBCUTANEOUS
  Administered 2016-07-30: 5 [IU] via SUBCUTANEOUS

## 2016-07-28 MED ORDER — IRON DEXTRAN 50 MG/ML IJ SOLN
25.0000 mg | Freq: Once | INTRAMUSCULAR | Status: AC
  Administered 2016-07-28: 25 mg via INTRAVENOUS
  Filled 2016-07-28: qty 0.5

## 2016-07-28 MED ORDER — HYDROCHLOROTHIAZIDE 12.5 MG PO CAPS
12.5000 mg | ORAL_CAPSULE | Freq: Every day | ORAL | Status: DC
  Administered 2016-07-29: 12.5 mg via ORAL
  Filled 2016-07-28: qty 1

## 2016-07-28 MED ORDER — IRBESARTAN 300 MG PO TABS
300.0000 mg | ORAL_TABLET | Freq: Every day | ORAL | Status: DC
  Administered 2016-07-29: 300 mg via ORAL
  Filled 2016-07-28 (×2): qty 1

## 2016-07-28 MED ORDER — VALSARTAN-HYDROCHLOROTHIAZIDE 320-12.5 MG PO TABS: 1.0000 | ORAL_TABLET | Freq: Every day | ORAL | Status: DC

## 2016-07-28 MED ORDER — TRAMADOL HCL 50 MG PO TABS
100.0000 mg | ORAL_TABLET | Freq: Two times a day (BID) | ORAL | Status: DC
  Administered 2016-07-28 – 2016-07-30 (×4): 100 mg via ORAL
  Filled 2016-07-28 (×4): qty 2

## 2016-07-28 MED ORDER — VALSARTAN-HYDROCHLOROTHIAZIDE 320-12.5 MG PO TABS: 1.0000 | ORAL_TABLET | Freq: Every day | ORAL | 1 refills | Status: DC

## 2016-07-28 MED ORDER — ONDANSETRON HCL 4 MG/2ML IJ SOLN
4.0000 mg | Freq: Four times a day (QID) | INTRAMUSCULAR | Status: DC | PRN
Start: 2016-07-28 — End: 2016-07-30

## 2016-07-28 MED ORDER — IPRATROPIUM-ALBUTEROL 0.5-2.5 (3) MG/3ML IN SOLN: 3.0000 mL | Freq: Four times a day (QID) | RESPIRATORY_TRACT | Status: DC | PRN

## 2016-07-28 MED ORDER — ACETAMINOPHEN 650 MG RE SUPP: 650.0000 mg | Freq: Four times a day (QID) | RECTAL | Status: DC | PRN

## 2016-07-28 MED ORDER — SODIUM CHLORIDE 0.9 % IV SOLN
25.0000 mg | Freq: Once | INTRAVENOUS | Status: DC
  Filled 2016-07-28: qty 0.5

## 2016-07-28 MED ORDER — SODIUM CHLORIDE 0.9 % IV SOLN
250.0000 mg | INTRAVENOUS | Status: DC
  Filled 2016-07-28: qty 5

## 2016-07-28 MED ORDER — METOPROLOL SUCCINATE ER 100 MG PO TB24
100.0000 mg | ORAL_TABLET | Freq: Every day | ORAL | Status: DC
  Administered 2016-07-29 – 2016-07-30 (×2): 100 mg via ORAL
  Filled 2016-07-28 (×3): qty 1

## 2016-07-28 MED ORDER — SODIUM CHLORIDE 0.9 % IV SOLN
250.0000 mg | Freq: Once | INTRAVENOUS | Status: AC
  Administered 2016-07-28: 250 mg via INTRAVENOUS
  Filled 2016-07-28: qty 5

## 2016-07-28 MED ORDER — DULOXETINE HCL 30 MG PO CPEP
30.0000 mg | ORAL_CAPSULE | Freq: Every day | ORAL | Status: DC
Start: 2016-07-29 — End: 2016-07-30
  Administered 2016-07-29 – 2016-07-30 (×2): 30 mg via ORAL
  Filled 2016-07-28 (×2): qty 1

## 2016-07-28 MED ORDER — INSULIN ASPART 100 UNIT/ML ~~LOC~~ SOLN: 0.0000 [IU] | Freq: Every day | SUBCUTANEOUS | Status: DC

## 2016-07-28 MED ORDER — SODIUM CHLORIDE 0.9 % IV SOLN
10.0000 mL/h | Freq: Once | INTRAVENOUS | Status: AC
  Administered 2016-07-28: 10 mL/h via INTRAVENOUS

## 2016-07-28 MED ORDER — ENOXAPARIN SODIUM 40 MG/0.4ML ~~LOC~~ SOLN
40.0000 mg | SUBCUTANEOUS | Status: DC
  Administered 2016-07-28: 40 mg via SUBCUTANEOUS
  Filled 2016-07-28: qty 0.4

## 2016-07-28 MED ORDER — ONDANSETRON HCL 4 MG PO TABS: 4.0000 mg | ORAL_TABLET | Freq: Four times a day (QID) | ORAL | Status: DC | PRN

## 2016-07-28 NOTE — ED Notes (Signed)
Patient aware urine sample is needed. Patient states she cannot void at this time. Encouraged patient to void when able.

## 2016-07-28 NOTE — ED Notes (Signed)
Attempted report to 5E. RN unavailable. 

## 2016-07-28 NOTE — ED Notes (Signed)
Signed blood consent at bedside  

## 2016-07-28 NOTE — Assessment & Plan Note (Signed)
BP on low side and having lightheadedness Will decrease diovan-hctz 320-25 to diovan-hctz 320-12.5 mg daily Continue current doses of metoprolol and hydralazine Start monitoring BP at home

## 2016-07-28 NOTE — ED Triage Notes (Addendum)
Patient is coming from [place]. Per EMS, patient seen at doctor today and her Hgb was 4.6 and Hmt 15.6. Reports dizziness for a couple of months. Patient denies in abnormal bleeding in stool or urine.

## 2016-07-28 NOTE — Significant Event (Signed)
Hypoglycemic Event  CBG: 63 at 1636  Treatment: 15 GM carbohydrate snack  Symptoms: None  Follow-up CBG: Time:1707 CBG Result:97  Possible Reasons for Event: Unknown  Comments/MD notified: MD made aware    Bettie Swavely sedo

## 2016-07-28 NOTE — Progress Notes (Signed)
Pt is admitted to 5E13 from ED.pt AOX4 anf family is at the bedside

## 2016-07-28 NOTE — Progress Notes (Signed)
Pre visit review using our clinic review tool, if applicable. No additional management support is needed unless otherwise documented below in the visit note. 

## 2016-07-28 NOTE — Assessment & Plan Note (Addendum)
Taking omeprazole once daily - GERD fairly controlled, but still has GERD 2/month - only when she eats certain foods Discussed options She will continue prilosec daily and use apple cider vinegar/gingerale as needed -- she would prefer not to increase her medication

## 2016-07-28 NOTE — ED Notes (Signed)
Patient given ginger ale and turkey sandwich.  

## 2016-07-28 NOTE — H&P (Addendum)
History and Physical    Theresa Cline [id number] DOB: 1951/09/03 DOA: 07/28/2016    PCP: Binnie Rail, MD  Patient coming from: home   Chief Complaint: weakness  HPI: Theresa Cline is a 65 y.o. female with medical history significant for DM, renal cancer s/p right nephrectomy, HTN, HLD who presents for weakness, lightheadedness and shortness of breath on exertion severe for a few days but progressing for a few weeks. She is noted to have severe anemia. No complaint of blood in stool or black stool. She has occasional heartburn which is mostly controlled with Prilosec. No history of PUD. She states she does not eat red meat. She has numbness and tingling in her feet which she states is diabetic neuropathy. She does not have good balance and uses a cane. She has not had any chest pain on exertion and had a Myoview stress test last year which was negative. No complaints of loss of appetite or recent weight loss. No rashes, bruises and no recent illness.  ED Course: Hb 4.3, K 3.1, Cr 1.04, hemoccult negative  Review of Systems:  Has problems with lower extremity edema and wears TED hose All other systems reviewed and apart from HPI, are negative.  Past Medical History:  Diagnosis Date  . Anxiety   . Bronchitis, acute   . CAD (coronary artery disease)   . Cancer [facility])    Renal s/p nephrectomy  . Chronic pain syndrome   . Diabetes mellitus type II, controlled ([place])   . Diabetes type 2, controlled ([place])   . Diabetic peripheral neuropathy ([place])   . Encounter for long-term (current) use of other medications   . GERD (gastroesophageal reflux disease)   . Hypertension   . 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 Mixed hyperlipidemia   . Obesity   . Osteoporosis   . Primary osteoarthritis of both knees   . S/P carpal tunnel release   . Sleep apnea    does not use CPAP  . Tobacco abuse   . Vertigo     Past Surgical History:  Procedure Laterality Date  . BREAST  LUMPECTOMY WITH RADIOACTIVE SEED LOCALIZATION Left 11/30/2015   Procedure: BREAST LUMPECTOMY WITH RADIOACTIVE SEED LOCALIZATION;  Surgeon: Erroll Luna, MD;  Location: [facility];  Service: General;  Laterality: Left;  . CARPAL TUNNEL RELEASE Right 08/09/1998  . CARPAL TUNNEL RELEASE Left 09/08/1998  . CESAREAN SECTION     x3  . CORONARY ANGIOPLASTY WITH STENT PLACEMENT  2004   Normal LV function  . LIPOMA EXCISION Right    Right foot  . Resection of Left Acoustic Neuroma  1999  . TOTAL ABDOMINAL HYSTERECTOMY W/ BILATERAL SALPINGOOPHORECTOMY  04/12/1988   For fibroid tumors; [place], [place]    Social History:   reports that she has been smoking Cigarettes.  She has a 10.00 pack-year smoking history. She has never used smokeless tobacco. She reports that she does not drink alcohol or use drugs.  Has been cutting down on smoking and has smoked about 4 cigarettes this month. Does not drink. Lives alone- uses a cane.   Allergies  Allergen Reactions  . Adhesive [Tape] Rash  . Gabapentin Swelling    Swelling all over body   . Lyrica [Pregabalin] Swelling    Swelling all over body     Family History  Problem Relation Age of Onset  . Leukemia Mother     CLL  . Diabetes type II Mother   .  Other Mother     Hypercholesterolemia  . Osteoarthritis Mother   . COPD Mother   . Hypertension Mother   . Hodgkin's lymphoma Father   . Diabetes type II Sister   . Osteoporosis Sister   . Paranoid behavior Brother     Unsure if diagnosed with Schizophrenia  . Diabetes type II Sister   . Hypertension Sister   . Diabetes type II Sister   . Hypertension Sister   . Schizophrenia Sister   . Heart disease Sister     Enlarged heart  . Glaucoma Sister   . Alcohol abuse Sister   . Glaucoma Sister   . Alcohol abuse Sister   . Bipolar disorder Daughter   . Lupus Daughter     SLE  . Healthy Daughter      Prior to Admission medications   Medication Sig Start Date End Date  Taking? Authorizing Provider  ACCU-CHEK FASTCLIX LANCETS MISC USE TO TEST BLOOD SUGAR THREE TIMES A DAY AND EVERY NIGHT AT BEDTIME 10/21/15  Yes Josalyn Funches, MD  ACCU-CHEK SMARTVIEW test strip USE TO TEST BLOOD SUGAR THREE TIMES A DAY AND EVERY NIGHT AT BEDTIME 10/21/15  Yes Josalyn Funches, MD  albuterol (PROAIR HFA) 108 (90 BASE) MCG/ACT inhaler Inhale 2 puffs by mouth every 6 hours as needed for wheezing or SOB. Please schedule an office visit for more refills. 10/01/15  Yes Boykin Nearing, MD  aspirin EC 81 MG tablet Take 81 mg by mouth at bedtime.    Yes Historical Provider, MD  atorvastatin (LIPITOR) 40 MG tablet TAKE 1 TABLET BY MOUTH ONCE DAILY 05/08/16  Yes Binnie Rail, MD  BIOTIN PO Take 1 tablet by mouth daily with breakfast.   Yes Historical Provider, MD  Cholecalciferol (VITAMIN D) 2000 UNITS CAPS Take 2,000 Units by mouth daily with breakfast.    Yes Historical Provider, MD  diclofenac sodium (VOLTAREN) 1 % GEL Apply 2 g topically 4 (four) times daily. Patient taking differently: Apply 2 g topically 4 (four) times daily as needed (for pain).  06/23/16  Yes Charlett Blake, MD  DULoxetine (CYMBALTA) 30 MG capsule TAKE 1 CAPSULE BY MOUTH EVERY DAY 06/23/16  Yes Charlett Blake, MD  hydrALAZINE (APRESOLINE) 50 MG tablet Take 1 tablet (50 mg total) by mouth 3 (three) times daily. 09/23/15  Yes Burnell Blanks, MD  influenza vac recom quadrivalent (FLUBLOK) 0.5 ML injection Inject 0.5 mLs into the muscle once.   Yes Historical Provider, MD  ipratropium (ATROVENT) 0.06 % nasal spray INSTILL 2 SPRAYS IN EACH NOSTRIL FOUR TIMES A DAY Patient taking differently: INSTILL 2 SPRAYS IN EACH NOSTRIL FOUR TIMES A DAY as needed for alleriges 11/12/15  Yes Josalyn Funches, MD  lidocaine (XYLOCAINE) 5 % ointment APPLY TOPICALLY THREE TIMES A DAY AS NEEDED Patient taking differently: APPLY TOPICALLY THREE TIMES A DAY AS NEEDED for nerve pain 05/08/16  Yes Charlett Blake, MD  metFORMIN  (GLUCOPHAGE) 500 MG tablet Take 1 tablet (500 mg total) by mouth 2 (two) times daily with a meal. 02/09/16  Yes Binnie Rail, MD  metoprolol succinate (TOPROL-XL) 100 MG 24 hr tablet Take 1 tablet (100 mg total) by mouth daily. Patient taking differently: Take 100 mg by mouth daily with breakfast.  09/23/15  Yes Burnell Blanks, MD  nitroGLYCERIN (NITROLINGUAL) 0.4 MG/SPRAY spray Place 1 spray under the tongue every 5 (five) minutes x 3 doses as needed for chest pain. 10/12/14  Yes Lorayne Marek, MD  omeprazole (  PRILOSEC) 20 MG capsule Take 1 capsule (20 mg total) by mouth daily. Patient taking differently: Take 20 mg by mouth daily with breakfast.  02/09/16  Yes Binnie Rail, MD  traMADol (ULTRAM) 50 MG tablet Take 2 tablets (100 mg total) by mouth 2 (two) times daily. 06/23/16  Yes Charlett Blake, MD  valsartan-hydrochlorothiazide (DIOVAN-HCT) 320-25 MG tablet Take 1 tablet by mouth at bedtime.   Yes Historical Provider, MD  valsartan-hydrochlorothiazide (DIOVAN HCT) 320-12.5 MG tablet Take 1 tablet by mouth daily. 07/28/16   Binnie Rail, MD    Physical Exam: Vitals:   07/28/16 1209 07/28/16 1230 07/28/16 1245 07/28/16 1254  BP: 167/82 163/81  154/65  Pulse: 67 69 70 71  Resp: 15 20 16 17   Temp:  97.9 F (36.6 C)  97.8 F (36.6 C)  TempSrc:  Oral  Oral  SpO2: 100% 96% 96% 100%  Weight:      Height:          Constitutional: NAD, calm, comfortable Eyes: PERTLA, lids and conjunctivae Pale ENMT: Mucous membranes are moist. Posterior pharynx clear of any exudate or lesions. Normal dentition.  Neck: normal, supple, no masses, no thyromegaly Respiratory: clear to auscultation bilaterally, no wheezing, no crackles. Normal respiratory effort. No accessory muscle use.  Cardiovascular: S1 & S2 heard, regular rate and rhythm, no murmurs / rubs / gallops. Mild lower extremity edema. 2+ pedal pulses. No carotid bruits.  Abdomen: No distension, no tenderness, no masses palpated. No  hepatosplenomegaly. Bowel sounds normal.  Musculoskeletal: no clubbing / cyanosis. No joint deformity upper and lower extremities. Good ROM, no contractures. Normal muscle tone.  Skin: no rashes, lesions, ulcers. No induration Neurologic: CN 2-12 grossly intact. Sensation intact, DTR normal. Strength 5/5 in all 4 limbs.  Psychiatric: Normal judgment and insight. Alert and oriented x 3. Normal mood.     Labs on Admission: I have personally reviewed following labs and imaging studies  CBC:  Recent Labs Lab 07/28/16 0940 07/28/16 1109  WBC 7.1 5.4  NEUTROABS 4.0 2.7  HGB 4.6 Repeated and verified X2.* 4.3*  HCT 15.6 Repeated and verified X2.* 16.1*  MCV 60.8* 64.4*  PLT 247.0 [id number]   Basic Metabolic Panel:  Recent Labs Lab 07/28/16 0940 07/28/16 1109  NA 140 138  K 3.7 3.1*  CL 102 101  CO2 29 27  GLUCOSE 94 76  BUN 17 17  CREATININE 1.00 1.04*  CALCIUM 9.3 9.2   GFR: Estimated Creatinine Clearance: 47.7 mL/min (by C-G formula based on SCr of 1.04 mg/dL (H)). Liver Function Tests:  Recent Labs Lab 07/28/16 0940  AST 43*  ALT 41*  ALKPHOS 70  BILITOT 0.4  PROT 7.1  ALBUMIN 4.0   No results for input(s): LIPASE, AMYLASE in the last 168 hours. No results for input(s): AMMONIA in the last 168 hours. Coagulation Profile: No results for input(s): INR, PROTIME in the last 168 hours. Cardiac Enzymes: No results for input(s): CKTOTAL, CKMB, CKMBINDEX, TROPONINI in the last 168 hours. BNP (last 3 results) No results for input(s): PROBNP in the last 8760 hours. HbA1C: No results for input(s): HGBA1C in the last 72 hours. CBG:  Recent Labs Lab 07/28/16 1107  GLUCAP 72   Lipid Profile: No results for input(s): CHOL, HDL, LDLCALC, TRIG, CHOLHDL, LDLDIRECT in the last 72 hours. Thyroid Function Tests:  Recent Labs  07/28/16 0940  TSH 2.36   Anemia Panel:  Recent Labs  07/28/16 1109  RETICCTPCT 1.2   Urine analysis:  Component Value Date/Time    COLORURINE YELLOW 09/25/2013 1417   APPEARANCEUR CLEAR 09/25/2013 1417   LABSPEC 1.015 09/25/2013 1417   PHURINE 7.0 09/25/2013 1417   GLUCOSEU NEGATIVE 09/25/2013 1417   HGBUR NEGATIVE 09/25/2013 1417   HGBUR negative 11/06/2008 1538   BILIRUBINUR NEGATIVE 09/25/2013 1417   KETONESUR NEGATIVE 09/25/2013 1417   PROTEINUR NEGATIVE 09/25/2013 1417   UROBILINOGEN 0.2 09/25/2013 1417   NITRITE NEGATIVE 09/25/2013 1417   LEUKOCYTESUR NEGATIVE 09/25/2013 1417   Sepsis Labs: @LABRCNTIP (procalcitonin:4,lacticidven:4) )No results found for this or any previous visit (from the past 240 hour(s)).   Radiological Exams on Admission: No results found.  Assessment/Plan Principal Problem:   Symptomatic anemia - last Hb 9.6 on 11/23/15 - hemoccult negative - anemia panel sent - Bilirubin not elevated therefore doubt hemolysis - transfusing 2 U PRBC now- will recheck Hb and transfuse more as needed  Active Problems: Hypokalemia - Replace orally- check Mg+    Mixed hyperlipidemia - Lipitor    TOBACCO ABUSE - Trying to quit    Essential hypertension - Toprol, Hydralazine, Diovan/HCTZ    GERD (gastroesophageal reflux disease) - Prilosec at home    DM type 2, controlled, with complication  - [id number] 6.4 on 5/17 - hold Metformin- ISS ordered    DVT prophylaxis: Lovenox  Code Status: Full code Family Communication: Daughter Disposition Plan: admit to med surg  Consults called:  None Admission status: admit    [facility] MD [facility] Pager: [email or web address] Password [email or web address] 7PM-7AM, please contact night-coverage   07/28/2016, 1:28 PM

## 2016-07-28 NOTE — ED Provider Notes (Signed)
[facility] DEPT Provider Note   CSN: [id number] Arrival date & time: 07/28/16  1049     History   Chief Complaint Chief Complaint  Patient presents with  . Abnormal Lab    HPI Theresa Cline is a 65 y.o. female.  HPI 65 year old female who presents with symptomatic anemia. History of CAD, DM, HTN, HLD. Not on anticoagulation. States 1 month of DOE, fatigue with minimal activity and lightheadedness. She saw her primary care doctor today for evaluation of this, and had routine blood work that was performed. She had a hemoglobin of 4.6, was sent to the ED for evaluation. She denies any syncope, chest pain, nausea or vomiting, abdominal pain, melena or hematochezia. Past Medical History:  Diagnosis Date  . Anxiety   . Bronchitis, acute   . CAD (coronary artery disease)   . Cancer [facility])    Renal s/p nephrectomy  . Chronic pain syndrome   . Diabetes mellitus type II, controlled ([place])   . Diabetes type 2, controlled ([place])   . Diabetic peripheral neuropathy ([place])   . Encounter for long-term (current) use of other medications   . GERD (gastroesophageal reflux disease)   . Hypertension   . 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 Mixed hyperlipidemia   . Obesity   . Osteoporosis   . Primary osteoarthritis of both knees   . S/P carpal tunnel release   . Sleep apnea    does not use CPAP  . Tobacco abuse   . Vertigo     Patient Active Problem List   Diagnosis Date Noted  . Osteopenia 03/15/2016  . Facet arthropathy, lumbosacral 02/24/2016  . GERD (gastroesophageal reflux disease) 02/09/2016  . Acoustic neuroma ([place]) 02/09/2016  . Right knee DJD 01/18/2012  . Trochanteric bursitis of both hips 12/11/2011  . Spinal stenosis, lumbar region, without neurogenic claudication 12/11/2011  . Cancer ([place])   . Syncope 07/17/2011  . EDEMA 07/12/2010  . Dizziness and giddiness 11/06/2008  . Diabetes ([place]) 10/15/2007  . 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 Mixed hyperlipidemia 10/15/2007  . TOBACCO ABUSE 10/15/2007  . Chronic pain syndrome 10/15/2007  . Essential hypertension 10/15/2007  . Coronary atherosclerosis 10/15/2007  . Osteoarthrosis, unspecified whether generalized or localized, involving lower leg 10/15/2007  . CARPAL TUNNEL RELEASE, BILATERAL, HX OF 08/09/1998    Past Surgical History:  Procedure Laterality Date  . BREAST LUMPECTOMY WITH RADIOACTIVE SEED LOCALIZATION Left 11/30/2015   Procedure: BREAST LUMPECTOMY WITH RADIOACTIVE SEED LOCALIZATION;  Surgeon: Erroll Luna, MD;  Location: [facility];  Service: General;  Laterality: Left;  . CARPAL TUNNEL RELEASE Right 08/09/1998  . CARPAL TUNNEL RELEASE Left 09/08/1998  . CESAREAN SECTION     x3  . CORONARY ANGIOPLASTY WITH STENT PLACEMENT  2004   Normal LV function  . LIPOMA EXCISION Right    Right foot  . Resection of Left Acoustic Neuroma  1999  . TOTAL ABDOMINAL HYSTERECTOMY W/ BILATERAL SALPINGOOPHORECTOMY  04/12/1988   For fibroid tumors; [place], [place]    OB History    No data available       Home Medications    Prior to Admission medications   Medication Sig Start Date End Date Taking? Authorizing Provider  ACCU-CHEK FASTCLIX LANCETS MISC USE TO TEST BLOOD SUGAR THREE TIMES A DAY AND EVERY NIGHT AT BEDTIME 10/21/15   Boykin Nearing, MD  ACCU-CHEK SMARTVIEW test strip USE TO TEST BLOOD SUGAR  THREE TIMES A DAY AND EVERY NIGHT AT BEDTIME 10/21/15   Josalyn Funches, MD  albuterol (PROAIR HFA) 108 (90 BASE) MCG/ACT inhaler Inhale 2 puffs by mouth every 6 hours as needed for wheezing or SOB. Please schedule an office visit for more refills. 10/01/15   Boykin Nearing, MD  aspirin EC 81 MG tablet Take 81 mg by mouth daily.    Historical Provider, MD  atorvastatin (LIPITOR) 40 MG tablet TAKE 1 TABLET BY MOUTH ONCE DAILY 05/08/16   Binnie Rail, MD  Cholecalciferol (VITAMIN D) 2000 UNITS CAPS Take 2,000 Units by mouth daily.     Historical Provider, MD  diclofenac sodium (VOLTAREN) 1 % GEL Apply 2 g topically 4 (four) times daily. 06/23/16   Charlett Blake, MD  DULoxetine (CYMBALTA) 30 MG capsule TAKE 1 CAPSULE BY MOUTH EVERY DAY 06/23/16   Charlett Blake, MD  hydrALAZINE (APRESOLINE) 50 MG tablet Take 1 tablet (50 mg total) by mouth 3 (three) times daily. 09/23/15   Burnell Blanks, MD  ipratropium (ATROVENT) 0.06 % nasal spray INSTILL 2 SPRAYS IN EACH NOSTRIL FOUR TIMES A DAY 11/12/15   Josalyn Funches, MD  lidocaine (XYLOCAINE) 5 % ointment APPLY TOPICALLY THREE TIMES A DAY AS NEEDED 05/08/16   Charlett Blake, MD  metFORMIN (GLUCOPHAGE) 500 MG tablet Take 1 tablet (500 mg total) by mouth 2 (two) times daily with a meal. 02/09/16   Binnie Rail, MD  metoprolol succinate (TOPROL-XL) 100 MG 24 hr tablet Take 1 tablet (100 mg total) by mouth daily. 09/23/15   Burnell Blanks, MD  nitroGLYCERIN (NITROLINGUAL) 0.4 MG/SPRAY spray Place 1 spray under the tongue every 5 (five) minutes x 3 doses as needed for chest pain. 10/12/14   Lorayne Marek, MD  omeprazole (PRILOSEC) 20 MG capsule Take 1 capsule (20 mg total) by mouth daily. 02/09/16   Binnie Rail, MD  traMADol (ULTRAM) 50 MG tablet Take 2 tablets (100 mg total) by mouth 2 (two) times daily. 06/23/16   Charlett Blake, MD  valsartan-hydrochlorothiazide (DIOVAN HCT) 320-12.5 MG tablet Take 1 tablet by mouth daily. 07/28/16   Binnie Rail, MD    Family History Family History  Problem Relation Age of Onset  . Leukemia Mother     CLL  . Diabetes type II Mother   . Other Mother     Hypercholesterolemia  . Osteoarthritis Mother   . COPD Mother   . Hypertension Mother   . Hodgkin's lymphoma Father   . Diabetes type II Sister   . Osteoporosis Sister   . Paranoid behavior Brother     Unsure if diagnosed with Schizophrenia  . Diabetes type II Sister   . Hypertension Sister   . Diabetes type II Sister   . Hypertension Sister   . Schizophrenia  Sister   . Heart disease Sister     Enlarged heart  . Glaucoma Sister   . Alcohol abuse Sister   . Glaucoma Sister   . Alcohol abuse Sister   . Bipolar disorder Daughter   . Lupus Daughter     SLE  . Healthy Daughter     Social History Social History  Substance Use Topics  . Smoking status: Current Every Day Smoker    Packs/day: 0.25    Years: 40.00    Types: Cigarettes  . Smokeless tobacco: Never Used     Comment: smoking 4-5 cigs/day  . Alcohol use No     Allergies   Adhesive [  tape]; Gabapentin; and Lyrica [pregabalin]   Review of Systems Review of Systems 10/14 systems reviewed and are negative other than those stated in the HPI   Physical Exam Updated Vital Signs BP 167/82   Pulse 67   Temp 98 F (36.7 C)   Resp 15   Ht 5\' 3"  (1.6 m)   Wt 144 lb (65.3 kg)   SpO2 100%   BMI 25.51 kg/m   Physical Exam Physical Exam  Nursing note and vitals reviewed. Constitutional: Well developed, well nourished, non-toxic, and in no acute distress Head: Normocephalic and atraumatic.  Mouth/Throat: Oropharynx is clear and moist.  Neck: Normal range of motion. Neck supple.  Cardiovascular: Normal rate and regular rhythm.   Pulmonary/Chest: Effort normal and breath sounds normal.  Abdominal: Soft. There is no tenderness. There is no rebound and no guarding.  Musculoskeletal: Normal range of motion.  Neurological: Alert, no facial droop, fluent speech, moves all extremities symmetrically Skin: Skin is warm and dry.  Psychiatric: Cooperative   ED Treatments / Results  Labs (all labs ordered are listed, but only abnormal results are displayed) Labs Reviewed  BASIC METABOLIC PANEL - Abnormal; Notable for the following:       Result Value   Potassium 3.1 (*)    Creatinine, Ser 1.04 (*)    GFR calc non Af Amer 54 (*)    All other components within normal limits  CBC - Abnormal; Notable for the following:    RBC 2.50 (*)    Hemoglobin 4.3 (*)    HCT 16.1 (*)     MCV 64.4 (*)    MCH 17.2 (*)    MCHC 26.7 (*)    RDW 18.6 (*)    All other components within normal limits  RETICULOCYTES - Abnormal; Notable for the following:    RBC. 2.50 (*)    All other components within normal limits  DIFFERENTIAL  URINALYSIS, ROUTINE W REFLEX MICROSCOPIC (NOT AT [facility])  VITAMIN B12  FOLATE  IRON AND TIBC  FERRITIN  CBG MONITORING, ED  POC OCCULT BLOOD, ED  TYPE AND SCREEN  PREPARE RBC (CROSSMATCH)    EKG  EKG Interpretation  Date/Time:  Friday July 28 2016 11:01:09 EDT Ventricular Rate:  68 PR Interval:    QRS Duration: 173 QT Interval:  433 QTC Calculation: 461 R Axis:   1 Text Interpretation:  Sinus rhythm Nonspecific intraventricular conduction delay no change from old.  Confirmed by Johnney Killian, MD, Jeannie Done [phone]) on 07/28/2016 11:11:54 AM       Radiology No results found.  Procedures Procedures (including critical care time) CRITICAL CARE Performed by: Forde Dandy   Total critical care time: 35 minutes  Critical care time was exclusive of separately billable procedures and treating other patients.  Critical care was necessary to treat or prevent imminent or life-threatening deterioration.  Critical care was time spent personally by me on the following activities: development of treatment plan with patient and/or surrogate as well as nursing, discussions with consultants, evaluation of patient's response to treatment, examination of patient, obtaining history from patient or surrogate, ordering and performing treatments and interventions, ordering and review of laboratory studies, ordering and review of radiographic studies, pulse oximetry and re-evaluation of patient's condition.  Medications Ordered in ED Medications  0.9 %  sodium chloride infusion (not administered)     Initial Impression / Assessment and Plan / ED Course  I have reviewed the triage vital signs and the nursing notes.  Pertinent labs & imaging  results that  were available during my care of the patient were reviewed by me and considered in my medical decision making (see chart for details).  Clinical Course    65 year old female who presents with symptomatic anemia. Shee is hemodynamically stable. Pale on exam, but otherwise in no acute distress and nontoxic in appearance. Exam otherwise unremarkable and nonfocal. Rectal exam with brown stool that is guaiac negative. With hemoglobin of 4.3 here and will transfuse 2 units of blood with 2 additional units on hold as needed. Anemia panel was also sent prior to transfusion. We'll admit to hospitalist service for ongoing management.  Final Clinical Impressions(s) / ED Diagnoses   Final diagnoses:  Symptomatic anemia    New Prescriptions New Prescriptions   No medications on file     Forde Dandy, MD 07/28/16 1216

## 2016-07-29 ENCOUNTER — Observation Stay (HOSPITAL_COMMUNITY): Payer: Medicare Other

## 2016-07-29 ENCOUNTER — Encounter: Payer: Self-pay | Admitting: Internal Medicine

## 2016-07-29 ENCOUNTER — Encounter (HOSPITAL_COMMUNITY): Payer: Self-pay | Admitting: Internal Medicine

## 2016-07-29 DIAGNOSIS — Z6825 Body mass index (BMI) 25.0-25.9, adult: Secondary | ICD-10-CM | POA: Diagnosis not present

## 2016-07-29 DIAGNOSIS — E782 Mixed hyperlipidemia: Secondary | ICD-10-CM | POA: Diagnosis present

## 2016-07-29 DIAGNOSIS — I2510 Atherosclerotic heart disease of native coronary artery without angina pectoris: Secondary | ICD-10-CM | POA: Diagnosis present

## 2016-07-29 DIAGNOSIS — I10 Essential (primary) hypertension: Secondary | ICD-10-CM

## 2016-07-29 DIAGNOSIS — D649 Anemia, unspecified: Secondary | ICD-10-CM | POA: Diagnosis not present

## 2016-07-29 DIAGNOSIS — R0600 Dyspnea, unspecified: Secondary | ICD-10-CM | POA: Diagnosis present

## 2016-07-29 DIAGNOSIS — E1142 Type 2 diabetes mellitus with diabetic polyneuropathy: Secondary | ICD-10-CM | POA: Diagnosis not present

## 2016-07-29 DIAGNOSIS — G894 Chronic pain syndrome: Secondary | ICD-10-CM | POA: Diagnosis not present

## 2016-07-29 DIAGNOSIS — Z8249 Family history of ischemic heart disease and other diseases of the circulatory system: Secondary | ICD-10-CM | POA: Diagnosis not present

## 2016-07-29 DIAGNOSIS — M170 Bilateral primary osteoarthritis of knee: Secondary | ICD-10-CM | POA: Diagnosis present

## 2016-07-29 DIAGNOSIS — D500 Iron deficiency anemia secondary to blood loss (chronic): Secondary | ICD-10-CM | POA: Diagnosis not present

## 2016-07-29 DIAGNOSIS — Z833 Family history of diabetes mellitus: Secondary | ICD-10-CM | POA: Diagnosis not present

## 2016-07-29 DIAGNOSIS — E669 Obesity, unspecified: Secondary | ICD-10-CM | POA: Diagnosis present

## 2016-07-29 DIAGNOSIS — M810 Age-related osteoporosis without current pathological fracture: Secondary | ICD-10-CM | POA: Diagnosis present

## 2016-07-29 DIAGNOSIS — D509 Iron deficiency anemia, unspecified: Secondary | ICD-10-CM

## 2016-07-29 DIAGNOSIS — K219 Gastro-esophageal reflux disease without esophagitis: Secondary | ICD-10-CM

## 2016-07-29 DIAGNOSIS — F17210 Nicotine dependence, cigarettes, uncomplicated: Secondary | ICD-10-CM | POA: Diagnosis present

## 2016-07-29 DIAGNOSIS — F419 Anxiety disorder, unspecified: Secondary | ICD-10-CM | POA: Diagnosis present

## 2016-07-29 DIAGNOSIS — Z7982 Long term (current) use of aspirin: Secondary | ICD-10-CM | POA: Diagnosis not present

## 2016-07-29 DIAGNOSIS — F17200 Nicotine dependence, unspecified, uncomplicated: Secondary | ICD-10-CM

## 2016-07-29 DIAGNOSIS — Z7984 Long term (current) use of oral hypoglycemic drugs: Secondary | ICD-10-CM | POA: Diagnosis not present

## 2016-07-29 DIAGNOSIS — R42 Dizziness and giddiness: Secondary | ICD-10-CM | POA: Insufficient documentation

## 2016-07-29 DIAGNOSIS — Z905 Acquired absence of kidney: Secondary | ICD-10-CM | POA: Diagnosis not present

## 2016-07-29 DIAGNOSIS — Z85528 Personal history of other malignant neoplasm of kidney: Secondary | ICD-10-CM | POA: Diagnosis not present

## 2016-07-29 DIAGNOSIS — G4730 Sleep apnea, unspecified: Secondary | ICD-10-CM | POA: Diagnosis present

## 2016-07-29 DIAGNOSIS — Z955 Presence of coronary angioplasty implant and graft: Secondary | ICD-10-CM | POA: Diagnosis not present

## 2016-07-29 DIAGNOSIS — R0609 Other forms of dyspnea: Principal | ICD-10-CM

## 2016-07-29 DIAGNOSIS — R933 Abnormal findings on diagnostic imaging of other parts of digestive tract: Secondary | ICD-10-CM | POA: Diagnosis not present

## 2016-07-29 DIAGNOSIS — Z8262 Family history of osteoporosis: Secondary | ICD-10-CM | POA: Diagnosis not present

## 2016-07-29 DIAGNOSIS — E876 Hypokalemia: Secondary | ICD-10-CM | POA: Diagnosis present

## 2016-07-29 HISTORY — DX: Iron deficiency anemia, unspecified: D50.9

## 2016-07-29 LAB — CBC
HEMATOCRIT: 24 % — AB (ref 36.0–46.0)
HEMOGLOBIN: 7.3 g/dL — AB (ref 12.0–15.0)
MCH: 21.9 pg — AB (ref 26.0–34.0)
MCHC: 30.4 g/dL (ref 30.0–36.0)
MCV: 71.9 fL — AB (ref 78.0–100.0)
Platelets: 178 10*3/uL (ref 150–400)
RBC: 3.34 MIL/uL — ABNORMAL LOW (ref 3.87–5.11)
RDW: 23.6 % — ABNORMAL HIGH (ref 11.5–15.5)
WBC: 6.3 10*3/uL (ref 4.0–10.5)

## 2016-07-29 LAB — BASIC METABOLIC PANEL
ANION GAP: 8 (ref 5–15)
BUN: 12 mg/dL (ref 6–20)
CALCIUM: 9 mg/dL (ref 8.9–10.3)
CHLORIDE: 103 mmol/L (ref 101–111)
CO2: 26 mmol/L (ref 22–32)
Creatinine, Ser: 0.69 mg/dL (ref 0.44–1.00)
GFR calc Af Amer: >60 mL/min (ref >60)
GFR calc non Af Amer: >60 mL/min (ref >60)
GLUCOSE: 76 mg/dL (ref 65–99)
POTASSIUM: 3.6 mmol/L (ref 3.5–5.1)
Sodium: 137 mmol/L (ref 135–145)

## 2016-07-29 LAB — MAGNESIUM: Magnesium: 1.5 mg/dL — ABNORMAL LOW (ref 1.7–2.4)

## 2016-07-29 LAB — GLUCOSE, CAPILLARY
GLUCOSE-CAPILLARY: 147 mg/dL — AB (ref 65–99)
GLUCOSE-CAPILLARY: 118 mg/dL — AB (ref 65–99)
GLUCOSE-CAPILLARY: 89 mg/dL (ref 65–99)
Glucose-Capillary: 77 mg/dL (ref 65–99)

## 2016-07-29 LAB — PREPARE RBC (CROSSMATCH)

## 2016-07-29 MED ORDER — IOPAMIDOL (ISOVUE-300) INJECTION 61%
80.0000 mL | Freq: Once | INTRAVENOUS | Status: AC | PRN
  Administered 2016-07-29: 80 mL via INTRAVENOUS

## 2016-07-29 MED ORDER — FUROSEMIDE 10 MG/ML IJ SOLN
20.0000 mg | Freq: Once | INTRAMUSCULAR | Status: AC
  Administered 2016-07-29: 20 mg via INTRAVENOUS
  Filled 2016-07-29: qty 2

## 2016-07-29 MED ORDER — ACETAMINOPHEN 325 MG PO TABS
650.0000 mg | ORAL_TABLET | Freq: Once | ORAL | Status: AC
  Administered 2016-07-29: 650 mg via ORAL
  Filled 2016-07-29: qty 2

## 2016-07-29 MED ORDER — HYDRALAZINE HCL 20 MG/ML IJ SOLN
10.0000 mg | Freq: Four times a day (QID) | INTRAMUSCULAR | Status: DC | PRN
  Administered 2016-07-29 – 2016-07-30 (×2): 10 mg via INTRAVENOUS
  Filled 2016-07-29 (×2): qty 1

## 2016-07-29 MED ORDER — DIPHENHYDRAMINE HCL 25 MG PO CAPS
25.0000 mg | ORAL_CAPSULE | Freq: Once | ORAL | Status: AC
  Administered 2016-07-29: 25 mg via ORAL
  Filled 2016-07-29: qty 1

## 2016-07-29 MED ORDER — IOPAMIDOL (ISOVUE-300) INJECTION 61%
30.0000 mL | Freq: Once | INTRAVENOUS | Status: AC | PRN
  Administered 2016-07-29: 30 mL via ORAL

## 2016-07-29 MED ORDER — MAGNESIUM SULFATE 4 GM/100ML IV SOLN
4.0000 g | Freq: Once | INTRAVENOUS | Status: AC
  Administered 2016-07-29: 4 g via INTRAVENOUS
  Filled 2016-07-29: qty 100

## 2016-07-29 MED ORDER — SODIUM CHLORIDE 0.9 % IV SOLN
Freq: Once | INTRAVENOUS | Status: AC
  Administered 2016-07-29: 10:00:00 via INTRAVENOUS

## 2016-07-29 NOTE — Assessment & Plan Note (Signed)
Sugars well controlled at home Check a1c Continue current medication

## 2016-07-29 NOTE — Progress Notes (Signed)
PROGRESS NOTE    Theresa Cline  [id number] DOB: 19-Mar-1951 DOA: 07/28/2016 PCP: Binnie Rail, MD    Brief Narrative:  Theresa Cline is a 65 y.o. female with medical history significant for DM, renal cancer s/p right nephrectomy, HTN, HLD who presents for weakness, lightheadedness and shortness of breath on exertion severe for a few days but progressing for a few weeks. She is noted to have severe anemia. No complaint of blood in stool or black stool. She has occasional heartburn which is mostly controlled with Prilosec. No history of PUD. She states she does not eat red meat. She has numbness and tingling in her feet which she states is diabetic neuropathy. She does not have good balance and uses a cane. She has not had any chest pain on exertion and had a Myoview stress test last year which was negative. No complaints of loss of appetite or recent weight loss. No rashes, bruises and no recent illness.  ED Course: Hb 4.3, K 3.1, Cr 1.04, hemoccult negative   Assessment & Plan:   Principal Problem:   Symptomatic anemia Active Problems:   Anemia, iron deficiency: Severe   Diabetic neuropathy associated with type 2 diabetes mellitus (HCC)   Mixed hyperlipidemia   TOBACCO ABUSE   Chronic pain syndrome   Essential hypertension   GERD (gastroesophageal reflux disease)   DM type 2, controlled, with complication ([place])   Lightheadedness  #1 symptomatic severe iron deficiency anemia Questionable etiology. Patient with no overt bleeding. Hemoglobin on admission was 4.3. Anemia panel with a iron level of 10 and a ferritin of 2 with a folate of 26.1 and a B 12 level of 454. Patient transfused 2 units packed red blood cells yesterday with hemoglobin currently at 7.3. Will transfuse another 2 units packed red blood cells. Patient has been placed on IV iron. Patient will likely need oral iron supplementation. GI has been consulted and recommended CT abdomen and pelvis initially. GI  following and appreciate input and recommendations.  #2 hypertension Continue home regimen of hydralazine, Avapro, HCTZ, metoprolol. If further blood pressure control is needed could increase HCTZ to 25 mg daily or increase hydralazine to 75 mg 3 times daily. Follow for now.  #3 tobacco abuse Tobacco cessation.  #4 gastroesophageal reflux disease PPI.  #5 well-controlled diabetes mellitus type 2 Hemoglobin A1c is 5.7. CBGs have ranged from 63-147. Oral hypoglycemic medications on hold. Continue sliding scale insulin. Will discontinue night coverage insulin.  #6 hyperlipidemia Continue statin.  #7 hypokalemia/hypomagnesemia Repleted. Magnesium level I.5. Replete.    DVT prophylaxis: Lovenox Code Status: Full Family Communication: Updated patient. No family present. Disposition Plan: Home once workup is completed and hemoglobin is stable.   Consultants:   Gastroenterology: Dr.Magod 07/29/2016  Procedures:   2 units packed red blood cells 07/28/2016  Antimicrobials:   None   Subjective: Patient with complaints of generalized weakness. No chest. No shortness of breath. No bleeding. No use of NSAIDs.  Objective: Vitals:   07/29/16 0835 07/29/16 1000 07/29/16 1100 07/29/16 1115  BP: (!) 191/89 (!) 150/73 (!) 150/70 (!) 171/74  Pulse:      Resp:      Temp: 98.8 F (37.1 C)  98 F (36.7 C) 98.5 F (36.9 C)  TempSrc: Oral  Oral Oral  SpO2:      Weight:      Height:        Intake/Output Summary (Last 24 hours) at 07/29/16 1234 Last data filed at 07/29/16 1115  Gross per 24 hour  Intake             1253 ml  Output                0 ml  Net             1253 ml   Filed Weights   07/28/16 1057  Weight: 65.3 kg (144 lb)    Examination:  General exam: Appears calm and comfortable  Respiratory system: Clear to auscultation. Respiratory effort normal. Cardiovascular system: S1 & S2 heard, RRR. No JVD, murmurs, rubs, gallops or clicks. No pedal  edema. Gastrointestinal system: Abdomen is nondistended, soft and nontender. No organomegaly or masses felt. Normal bowel sounds heard. Central nervous system: Alert and oriented. No focal neurological deficits. Extremities: Symmetric 5 x 5 power. Skin: No rashes, lesions or ulcers Psychiatry: Judgement and insight appear normal. Mood & affect appropriate.     Data Reviewed: I have personally reviewed following labs and imaging studies  CBC:  Recent Labs Lab 07/28/16 0940 07/28/16 1109 07/29/16 0606  WBC 7.1 5.4 6.3  NEUTROABS 4.0 2.7  --   HGB 4.6 Repeated and verified X2.* 4.3* 7.3*  HCT 15.6 Repeated and verified X2.* 16.1* 24.0*  MCV 60.8* 64.4* 71.9*  PLT 247.0 228 0000000   Basic Metabolic Panel:  Recent Labs Lab 07/28/16 0940 07/28/16 1109 07/29/16 0606 07/29/16 0639  NA 140 138 137  --   K 3.7 3.1* 3.6  --   CL 102 101 103  --   CO2 29 27 26   --   GLUCOSE 94 76 76  --   BUN 17 17 12   --   CREATININE 1.00 1.04* 0.69  --   CALCIUM 9.3 9.2 9.0  --   MG  --  1.7  --  1.5*   GFR: Estimated Creatinine Clearance: 62.1 mL/min (by C-G formula based on SCr of 0.69 mg/dL). Liver Function Tests:  Recent Labs Lab 07/28/16 0940  AST 43*  ALT 41*  ALKPHOS 70  BILITOT 0.4  PROT 7.1  ALBUMIN 4.0   No results for input(s): LIPASE, AMYLASE in the last 168 hours. No results for input(s): AMMONIA in the last 168 hours. Coagulation Profile: No results for input(s): INR, PROTIME in the last 168 hours. Cardiac Enzymes: No results for input(s): CKTOTAL, CKMB, CKMBINDEX, TROPONINI in the last 168 hours. BNP (last 3 results) No results for input(s): PROBNP in the last 8760 hours. HbA1C:  Recent Labs  07/28/16 1232  HGBA1C 5.7*   CBG:  Recent Labs Lab 07/28/16 1107 07/28/16 1634 07/28/16 1707 07/29/16 0747 07/29/16 1128  GLUCAP 72 63* 97 77 147*   Lipid Profile: No results for input(s): CHOL, HDL, LDLCALC, TRIG, CHOLHDL, LDLDIRECT in the last 72  hours. Thyroid Function Tests:  Recent Labs  07/28/16 0940  TSH 2.36   Anemia Panel:  Recent Labs  07/28/16 1109 07/28/16 1118  VITAMINB12  --  454  FOLATE  --  26.1  FERRITIN  --  2*  TIBC  --  494*  IRON  --  10*  RETICCTPCT 1.2  --    Sepsis Labs: No results for input(s): PROCALCITON, LATICACIDVEN in the last 168 hours.  No results found for this or any previous visit (from the past 240 hour(s)).       Radiology Studies: No results found.      Scheduled Meds: . atorvastatin  40 mg Oral Daily  . DULoxetine  30 mg Oral Daily  .  enoxaparin (LOVENOX) injection  40 mg Subcutaneous Q24H  . furosemide  20 mg Intravenous Once  . hydrALAZINE  50 mg Oral TID  . irbesartan  300 mg Oral Daily   And  . hydrochlorothiazide  12.5 mg Oral Daily  . insulin aspart  0-15 Units Subcutaneous TID WC  . insulin aspart  0-5 Units Subcutaneous QHS  . magnesium sulfate 1 - 4 g bolus IVPB  4 g Intravenous Once  . metoprolol succinate  100 mg Oral Q breakfast  . pantoprazole  40 mg Oral Daily  . traMADol  100 mg Oral BID   Continuous Infusions:    LOS: 0 days    Time spent: 33 minutes    Theresa Zywicki, MD [facility] Pager [phone]  If 7PM-7AM, please contact night-coverage [email or web address] Password [facility] 07/29/2016, 12:34 PM

## 2016-07-29 NOTE — Assessment & Plan Note (Signed)
Multiple possible causes, possibly due to hypotension - BP medication decreased, monitor BP at home Check basic blood work and EKG today

## 2016-07-29 NOTE — Consult Note (Signed)
Reason for Consult: Iron deficiency Referring Physician: Hospital team  Theresa Cline is an 65 y.o. female.  HPI: Patient seen and examined and her hospital computer chart was reviewed and our office computer chart was reviewed and she did have a colonoscopy and endoscopy without significant findings in February 2015 and a CT done as then as well and she denies any GI problems or signs of bleeding from anywhere and she denies any blood thinners or arthritis pills and nothing runs in  her family from a GI standpoint and she has no other complaints and her case was discussed with the hospital team as well and she began trying to lose weight 10 years ago but her weight has been stable this year and she's had no fever or night sweats and just occasional chills  Past Medical History:  Diagnosis Date  . Anemia, iron deficiency: Severe 07/29/2016  . Anxiety   . Bronchitis, acute   . CAD (coronary artery disease)   . Cancer [facility])    Renal s/p nephrectomy  . Chronic pain syndrome   . Diabetes mellitus type II, controlled ([facility])   . Diabetes type 2, controlled ([place])   . Diabetic peripheral neuropathy ([place])   . Encounter for long-term (current) use of other medications   . GERD (gastroesophageal reflux disease)   . Hypertension   . 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 Mixed hyperlipidemia   . Obesity   . Osteoporosis   . Primary osteoarthritis of both knees   . S/P carpal tunnel release   . Sleep apnea    does not use CPAP  . Tobacco abuse   . Vertigo     Past Surgical History:  Procedure Laterality Date  . BREAST LUMPECTOMY WITH RADIOACTIVE SEED LOCALIZATION Left 11/30/2015   Procedure: BREAST LUMPECTOMY WITH RADIOACTIVE SEED LOCALIZATION;  Surgeon: Erroll Luna, MD;  Location: [place];  Service: General;  Laterality: Left;  . CARPAL TUNNEL RELEASE Right 08/09/1998  . CARPAL TUNNEL RELEASE Left 09/08/1998  . CESAREAN SECTION     x3  . CORONARY ANGIOPLASTY  WITH STENT PLACEMENT  2004   Normal LV function  . LIPOMA EXCISION Right    Right foot  . Resection of Left Acoustic Neuroma  1999  . TOTAL ABDOMINAL HYSTERECTOMY W/ BILATERAL SALPINGOOPHORECTOMY  04/12/1988   For fibroid tumors; [place], [place]    Family History  Problem Relation Age of Onset  . Leukemia Mother     CLL  . Diabetes type II Mother   . Other Mother     Hypercholesterolemia  . Osteoarthritis Mother   . COPD Mother   . Hypertension Mother   . Hodgkin's lymphoma Father   . Diabetes type II Sister   . Osteoporosis Sister   . Paranoid behavior Brother     Unsure if diagnosed with Schizophrenia  . Diabetes type II Sister   . Hypertension Sister   . Diabetes type II Sister   . Hypertension Sister   . Schizophrenia Sister   . Heart disease Sister     Enlarged heart  . Glaucoma Sister   . Alcohol abuse Sister   . Glaucoma Sister   . Alcohol abuse Sister   . Bipolar disorder Daughter   . Lupus Daughter     SLE  . Healthy Daughter     Social History:  reports that she has been smoking Cigarettes.  She has a 10.00 pack-year smoking history. She has never used  smokeless tobacco. She reports that she does not drink alcohol or use drugs.  Allergies:  Allergies  Allergen Reactions  . Adhesive [Tape] Rash  . Gabapentin Swelling    Swelling all over body   . Lyrica [Pregabalin] Swelling    Swelling all over body     Medications: I have reviewed the patient's current medications.  Results for orders placed or performed during the hospital encounter of 07/28/16 (from the past 48 hour(s))  Urinalysis, Routine w reflex microscopic     Status: None   Collection Time: 07/28/16 11:01 AM  Result Value Ref Range   Color, Urine YELLOW YELLOW   APPearance CLEAR CLEAR   Specific Gravity, Urine 1.015 1.005 - 1.030   pH 7.0 5.0 - 8.0   Glucose, UA NEGATIVE NEGATIVE mg/dL   Hgb urine dipstick NEGATIVE NEGATIVE   Bilirubin Urine NEGATIVE NEGATIVE   Ketones, ur NEGATIVE  NEGATIVE mg/dL   Protein, ur NEGATIVE NEGATIVE mg/dL   Nitrite NEGATIVE NEGATIVE   Leukocytes, UA NEGATIVE NEGATIVE    Comment: MICROSCOPIC NOT DONE ON URINES WITH NEGATIVE PROTEIN, BLOOD, LEUKOCYTES, NITRITE, OR GLUCOSE <1000 mg/dL.  CBG monitoring, ED     Status: None   Collection Time: 07/28/16 11:07 AM  Result Value Ref Range   Glucose-Capillary 72 65 - 99 mg/dL  Basic metabolic panel     Status: Abnormal   Collection Time: 07/28/16 11:09 AM  Result Value Ref Range   Sodium 138 135 - 145 mmol/L   Potassium 3.1 (L) 3.5 - 5.1 mmol/L   Chloride 101 101 - 111 mmol/L   CO2 27 22 - 32 mmol/L   Glucose, Bld 76 65 - 99 mg/dL   BUN 17 6 - 20 mg/dL   Creatinine, Ser 1.04 (H) 0.44 - 1.00 mg/dL   Calcium 9.2 8.9 - 10.3 mg/dL   GFR calc non Af Amer 54 (L) >60 mL/min   GFR calc Af Amer >60 >60 mL/min    Comment: (NOTE) The eGFR has been calculated using the CKD EPI equation. This calculation has not been validated in all clinical situations. eGFR's persistently <60 mL/min signify possible Chronic Kidney Disease.    Anion gap 10 5 - 15  CBC     Status: Abnormal   Collection Time: 07/28/16 11:09 AM  Result Value Ref Range   WBC 5.4 4.0 - 10.5 K/uL   RBC 2.50 (L) 3.87 - 5.11 MIL/uL   Hemoglobin 4.3 (LL) 12.0 - 15.0 g/dL    Comment: REPEATED TO VERIFY RESULT CHECKED CRITICAL RESULT CALLED TO, READ BACK BY AND VERIFIED WITH: ROSSER,M. RN _0  ON 10.20.17 BY MCCOY,N.    HCT 16.1 (L) 36.0 - 46.0 %   MCV 64.4 (L) 78.0 - 100.0 fL   MCH 17.2 (L) 26.0 - 34.0 pg   MCHC 26.7 (L) 30.0 - 36.0 g/dL   RDW 18.6 (H) 11.5 - 15.5 %   Platelets 228 150 - 400 K/uL  Reticulocytes     Status: Abnormal   Collection Time: 07/28/16 11:09 AM  Result Value Ref Range   Retic Ct Pct 1.2 0.4 - 3.1 %   RBC. 2.50 (L) 3.87 - 5.11 MIL/uL   Retic Count, Manual 30.0 19.0 - 186.0 K/uL  Differential     Status: None   Collection Time: 07/28/16 11:09 AM  Result Value Ref Range   Neutrophils Relative % 51 %    Lymphocytes Relative 33 %   Monocytes Relative 12 %   Eosinophils Relative 3 %  Basophils Relative 1 %   Neutro Abs 2.7 1.7 - 7.7 K/uL   Lymphs Abs 1.8 0.7 - 4.0 K/uL   Monocytes Absolute 0.6 0.1 - 1.0 K/uL   Eosinophils Absolute 0.2 0.0 - 0.7 K/uL   Basophils Absolute 0.1 0.0 - 0.1 K/uL   RBC Morphology TARGET CELLS     Comment: POLYCHROMASIA PRESENT  Magnesium     Status: None   Collection Time: 07/28/16 11:09 AM  Result Value Ref Range   Magnesium 1.7 1.7 - 2.4 mg/dL  Vitamin B12     Status: None   Collection Time: 07/28/16 11:18 AM  Result Value Ref Range   Vitamin B-12 454 180 - 914 pg/mL    Comment: (NOTE) This assay is not validated for testing neonatal or myeloproliferative syndrome specimens for Vitamin B12 levels. Performed at [facility]   Folate     Status: None   Collection Time: 07/28/16 11:18 AM  Result Value Ref Range   Folate 26.1 >5.9 ng/mL    Comment: Performed at [facility]  Iron and TIBC     Status: Abnormal   Collection Time: 07/28/16 11:18 AM  Result Value Ref Range   Iron 10 (L) 28 - 170 ug/dL   TIBC 494 (H) 250 - 450 ug/dL   Saturation Ratios 2 (L) 10.4 - 31.8 %   UIBC 484 ug/dL    Comment: Performed at [facility]  Ferritin     Status: Abnormal   Collection Time: 07/28/16 11:18 AM  Result Value Ref Range   Ferritin 2 (L) 11 - 307 ng/mL    Comment: Performed at [facility]  Type and screen [place]     Status: None (Preliminary result)   Collection Time: 07/28/16 11:18 AM  Result Value Ref Range   ABO/RH(D) O POS    Antibody Screen NEG    Sample Expiration 07/31/2016    Unit Number [id number]    Blood Component Type RED CELLS,LR    Unit division 00    Status of Unit ISSUED    Transfusion Status OK TO TRANSFUSE    Crossmatch Result Compatible    Unit Number [id number]    Blood Component Type RED CELLS,LR    Unit division 00    Status of Unit ISSUED    Transfusion Status OK  TO TRANSFUSE    Crossmatch Result Compatible    Unit Number [id number]    Blood Component Type RED CELLS,LR    Unit division 00    Status of Unit ISSUED    Transfusion Status OK TO TRANSFUSE    Crossmatch Result Compatible    Unit Number [id number]    Blood Component Type RED CELLS,LR    Unit division 00    Status of Unit ALLOCATED    Transfusion Status OK TO TRANSFUSE    Crossmatch Result Compatible   POC occult blood, ED     Status: None   Collection Time: 07/28/16 11:49 AM  Result Value Ref Range   Fecal Occult Bld NEGATIVE NEGATIVE  Prepare RBC     Status: None   Collection Time: 07/28/16 12:30 PM  Result Value Ref Range   Order Confirmation ORDER PROCESSED BY BLOOD BANK   Glucose, capillary     Status: Abnormal   Collection Time: 07/28/16  4:34 PM  Result Value Ref Range   Glucose-Capillary 63 (L) 65 - 99 mg/dL  Glucose, capillary     Status: None   Collection Time:  07/28/16  5:07 PM  Result Value Ref Range   Glucose-Capillary 97 65 - 99 mg/dL  Basic metabolic panel     Status: None   Collection Time: 07/29/16  6:06 AM  Result Value Ref Range   Sodium 137 135 - 145 mmol/L   Potassium 3.6 3.5 - 5.1 mmol/L   Chloride 103 101 - 111 mmol/L   CO2 26 22 - 32 mmol/L   Glucose, Bld 76 65 - 99 mg/dL   BUN 12 6 - 20 mg/dL   Creatinine, Ser 0.69 0.44 - 1.00 mg/dL   Calcium 9.0 8.9 - 10.3 mg/dL   GFR calc non Af Amer >60 >60 mL/min   GFR calc Af Amer >60 >60 mL/min    Comment: (NOTE) The eGFR has been calculated using the CKD EPI equation. This calculation has not been validated in all clinical situations. eGFR's persistently <60 mL/min signify possible Chronic Kidney Disease.    Anion gap 8 5 - 15  CBC     Status: Abnormal   Collection Time: 07/29/16  6:06 AM  Result Value Ref Range   WBC 6.3 4.0 - 10.5 K/uL   RBC 3.34 (L) 3.87 - 5.11 MIL/uL   Hemoglobin 7.3 (L) 12.0 - 15.0 g/dL    Comment: DELTA CHECK NOTED POST TRANSFUSION SPECIMEN    HCT 24.0 (L) 36.0 -  46.0 %   MCV 71.9 (L) 78.0 - 100.0 fL    Comment: DELTA CHECK NOTED POST TRANSFUSION SPECIMEN    MCH 21.9 (L) 26.0 - 34.0 pg   MCHC 30.4 30.0 - 36.0 g/dL   RDW 23.6 (H) 11.5 - 15.5 %   Platelets 178 150 - 400 K/uL  Magnesium     Status: Abnormal   Collection Time: 07/29/16  6:39 AM  Result Value Ref Range   Magnesium 1.5 (L) 1.7 - 2.4 mg/dL  Glucose, capillary     Status: None   Collection Time: 07/29/16  7:47 AM  Result Value Ref Range   Glucose-Capillary 77 65 - 99 mg/dL  Prepare RBC     Status: None   Collection Time: 07/29/16  8:43 AM  Result Value Ref Range   Order Confirmation ORDER PROCESSED BY BLOOD BANK   Glucose, capillary     Status: Abnormal   Collection Time: 07/29/16 11:28 AM  Result Value Ref Range   Glucose-Capillary 147 (H) 65 - 99 mg/dL    No results found.  ROS negative except above she is feeling better with her transfusion Blood pressure (!) 171/74, pulse 72, temperature 98.5 F (36.9 C), temperature source Oral, resp. rate 18, height _0  (1.6 m), weight 65.3 kg (144 lb), SpO2 98 %. Physical Exam vital signs stable afebrile no acute distress lungs clear regular rate and rhythm abdomen is soft nontender labs reviewed previous workup reviewed  Assessment/Plan: Iron deficiency questionably etiology Plan: Will begin workup with a CT and decide further workup and plans pending those findings  Harry Bark E 07/29/2016, 12:19 PM

## 2016-07-29 NOTE — Assessment & Plan Note (Signed)
Multiple possible causes Check cxr, ekg and blood work Possibly related to low BP- medication decreased Will determine further treatment/evaluation after above results

## 2016-07-30 DIAGNOSIS — E118 Type 2 diabetes mellitus with unspecified complications: Secondary | ICD-10-CM

## 2016-07-30 LAB — BASIC METABOLIC PANEL
Anion gap: 8 (ref 5–15)
BUN: 12 mg/dL (ref 6–20)
CALCIUM: 9.3 mg/dL (ref 8.9–10.3)
CHLORIDE: 97 mmol/L — AB (ref 101–111)
CO2: 29 mmol/L (ref 22–32)
CREATININE: 0.78 mg/dL (ref 0.44–1.00)
GFR calc Af Amer: >60 mL/min (ref >60)
Glucose, Bld: 91 mg/dL (ref 65–99)
POTASSIUM: 3.2 mmol/L — AB (ref 3.5–5.1)
SODIUM: 134 mmol/L — AB (ref 135–145)

## 2016-07-30 LAB — CBC
HEMATOCRIT: 36.3 % (ref 36.0–46.0)
HEMOGLOBIN: 11.4 g/dL — AB (ref 12.0–15.0)
MCH: 23.7 pg — AB (ref 26.0–34.0)
MCHC: 31.4 g/dL (ref 30.0–36.0)
MCV: 75.5 fL — AB (ref 78.0–100.0)
Platelets: 148 10*3/uL — ABNORMAL LOW (ref 150–400)
RBC: 4.81 MIL/uL (ref 3.87–5.11)
RDW: 23 % — ABNORMAL HIGH (ref 11.5–15.5)
WBC: 7.7 10*3/uL (ref 4.0–10.5)

## 2016-07-30 LAB — GLUCOSE, CAPILLARY
GLUCOSE-CAPILLARY: 226 mg/dL — AB (ref 65–99)
Glucose-Capillary: 101 mg/dL — ABNORMAL HIGH (ref 65–99)

## 2016-07-30 LAB — MAGNESIUM: Magnesium: 2 mg/dL (ref 1.7–2.4)

## 2016-07-30 MED ORDER — IRBESARTAN 300 MG PO TABS
300.0000 mg | ORAL_TABLET | Freq: Every day | ORAL | Status: DC
  Administered 2016-07-30: 300 mg via ORAL
  Filled 2016-07-30: qty 1

## 2016-07-30 MED ORDER — POTASSIUM CHLORIDE CRYS ER 20 MEQ PO TBCR
40.0000 meq | EXTENDED_RELEASE_TABLET | ORAL | Status: AC
  Administered 2016-07-30 (×2): 40 meq via ORAL
  Filled 2016-07-30 (×2): qty 2

## 2016-07-30 MED ORDER — VALSARTAN-HYDROCHLOROTHIAZIDE 320-25 MG PO TABS: 1.0000 | ORAL_TABLET | Freq: Every day | ORAL | 0 refills | Status: DC

## 2016-07-30 MED ORDER — HYDROCHLOROTHIAZIDE 25 MG PO TABS
25.0000 mg | ORAL_TABLET | Freq: Every day | ORAL | Status: DC
  Administered 2016-07-30: 25 mg via ORAL
  Filled 2016-07-30: qty 1

## 2016-07-30 MED ORDER — HYDRALAZINE HCL 25 MG PO TABS: 75.0000 mg | ORAL_TABLET | Freq: Three times a day (TID) | ORAL | 0 refills | Status: DC

## 2016-07-30 MED ORDER — HYDRALAZINE HCL 50 MG PO TABS
75.0000 mg | ORAL_TABLET | Freq: Three times a day (TID) | ORAL | Status: DC
  Administered 2016-07-30: 75 mg via ORAL
  Filled 2016-07-30 (×2): qty 1

## 2016-07-30 NOTE — Discharge Summary (Signed)
Physician Discharge Summary  Theresa Cline [id number] DOB: 11-Jun-1951 DOA: 07/28/2016  PCP: Binnie Rail, MD  Admit date: 07/28/2016 Discharge date: 07/30/2016  Time spent: 65 minutes  Recommendations for Outpatient Follow-up:  1. Follow-up with Dr. Watt Climes, [facility] gastroenterology early next week for further evaluation of patient's severe iron deficiency anemia. May need colonoscopy and endoscopy for further evaluation. 2. Follow-up with Binnie Rail, MD in 2 weeks. All follow-up patient in the compressive metabolic profile done to follow-up on electrolytes and renal function. CBC need to be done to follow-up on count 7 3.    Discharge Diagnoses:  Principal Problem:   Symptomatic anemia Active Problems:   Anemia, iron deficiency: Severe   Diabetic neuropathy associated with type 2 diabetes mellitus ([place])   Mixed hyperlipidemia   TOBACCO ABUSE   Chronic pain syndrome   Essential hypertension   GERD (gastroesophageal reflux disease)   DM type 2, controlled, with complication ([place])   Lightheadedness   Discharge Condition: Stable and improved  Diet recommendation: Carb modified diet  Filed Weights   07/28/16 1057  Weight: 65.3 kg (144 lb)    History of present illness:  Per Dr.Rizwan  Theresa Cline is a 65 y.o. female with medical history significant for DM, renal cancer s/p right nephrectomy, HTN, HLD who presented  for weakness, lightheadedness and shortness of breath on exertion severe for a few days but progressing for a few weeks. She was noted to have severe anemia. No complaint of blood in stool or black stool. She had occasional heartburn which is mostly controlled with Prilosec. No history of PUD. She stated she does not eat red meat. She has numbness and tingling in her feet which she states is diabetic neuropathy. She does not have good balance and uses a cane. She has not had any chest pain on exertion and had a Myoview stress test last year which was  negative. No complaints of loss of appetite or recent weight loss. No rashes, bruises and no recent illness.  ED Course: Hb 4.3, K 3.1, Cr 1.04, hemoccult negative  Hospital Course:  #1 symptomatic severe iron deficiency anemia Questionable etiology. Patient with no overt bleeding. Hemoglobin on admission was 4.3. Anemia panel with a iron level of 10 and a ferritin of 2 with a folate of 26.1 and a B 12 level of 454. Patient transfused a total of 4 units packed red blood cells and hemoglobin with 1 up to 11.4 by [facility] discharge from 4.3 on admission. Patient improved clinically. Patient was placed on IV iron. yesterday with hemoglobin currently at 7.3. Will transfuse another 2 units packed red blood cells. Patient has been placed on IV iron. Patient will likely need oral iron supplementation in the outpatient setting. Patient was seen in consultation by GI who recommended a CT abdomen and pelvis which was negative. Patient remained hemodynamically stable. H&H remained stable. . GI has been consulted and recommended CT abdomen and pelvis initially.  CT abdomen and pelvis was negative for any acute abnormalities. Patient had wanted to go home. And in discussions with GI  it was recommended that patient was okay to go home with outpatient GI follow-up and workup early next week.   #2 hypertension Patient was initially maintained on home regimen of hydralazine, Avapro, HCTZ, metoprolol. Patient blood pressure continued to remain elevated and a such patient's hydralazine was increased to 75 mg 3 times daily. Patient's Diovan HCT was increased to 320/25 mg daily. Outpatient follow-up.   #3  tobacco abuse Tobacco cessation.  #4 gastroesophageal reflux disease Patient was maintained on a PPI.  #5 well-controlled diabetes mellitus type 2 Hemoglobin A1c is 5.7. CBGs were well controlled during the hospitalization. Patient's oral hypoglycemic medications were held. Patient was maintained on a sliding scale  insulin. Outpatient follow-up.   #6 hyperlipidemia Continued on statin.  #7 hypokalemia/hypomagnesemia Repleted.     Procedures:  4 UNITS PRBC 10/20-21/2017  CT abdomen and pelvis 07/29/2016  Consultations:  Gastroenterology: Dr.Magod 07/29/2016  Discharge Exam: Vitals:   07/30/16 0929 07/30/16 1112  BP: (!) 175/91 (!) 152/76  Pulse: 73 67  Resp:    Temp:      General: NAD Cardiovascular: RRR Respiratory: CTAB  Discharge Instructions   Discharge Instructions    Diet Carb Modified    Complete by:  As directed    Increase activity slowly    Complete by:  As directed      Current Discharge Medication List    START taking these medications   Details  valsartan-hydrochlorothiazide (DIOVAN HCT) 320-25 MG tablet Take 1 tablet by mouth daily. Qty: 30 tablet, Refills: 0      CONTINUE these medications which have CHANGED   Details  hydrALAZINE (APRESOLINE) 25 MG tablet Take 3 tablets (75 mg total) by mouth 3 (three) times daily. Qty: 270 tablet, Refills: 0      CONTINUE these medications which have NOT CHANGED   Details  ACCU-CHEK FASTCLIX LANCETS MISC USE TO TEST BLOOD SUGAR THREE TIMES A DAY AND EVERY NIGHT AT BEDTIME Qty: 102 each, Refills: 1    ACCU-CHEK SMARTVIEW test strip USE TO TEST BLOOD SUGAR THREE TIMES A DAY AND EVERY NIGHT AT BEDTIME Qty: 100 each, Refills: 1    albuterol (PROAIR HFA) 108 (90 BASE) MCG/ACT inhaler Inhale 2 puffs by mouth every 6 hours as needed for wheezing or SOB. Please schedule an office visit for more refills. Qty: 18 g, Refills: 0    aspirin EC 81 MG tablet Take 81 mg by mouth at bedtime.     atorvastatin (LIPITOR) 40 MG tablet TAKE 1 TABLET BY MOUTH ONCE DAILY Qty: 90 tablet, Refills: 1    BIOTIN PO Take 1 tablet by mouth daily with breakfast.    Cholecalciferol (VITAMIN D) 2000 UNITS CAPS Take 2,000 Units by mouth daily with breakfast.     diclofenac sodium (VOLTAREN) 1 % GEL Apply 2 g topically 4 (four) times  daily. Qty: 3 Tube, Refills: 2    DULoxetine (CYMBALTA) 30 MG capsule TAKE 1 CAPSULE BY MOUTH EVERY DAY Qty: 30 capsule, Refills: 5    influenza vac recom quadrivalent (FLUBLOK) 0.5 ML injection Inject 0.5 mLs into the muscle once.    ipratropium (ATROVENT) 0.06 % nasal spray INSTILL 2 SPRAYS IN EACH NOSTRIL FOUR TIMES A DAY Qty: 15 mL, Refills: 2    lidocaine (XYLOCAINE) 5 % ointment APPLY TOPICALLY THREE TIMES A DAY AS NEEDED Qty: 100 g, Refills: 1    metFORMIN (GLUCOPHAGE) 500 MG tablet Take 1 tablet (500 mg total) by mouth 2 (two) times daily with a meal. Qty: 180 tablet, Refills: 3    metoprolol succinate (TOPROL-XL) 100 MG 24 hr tablet Take 1 tablet (100 mg total) by mouth daily. Qty: 30 tablet, Refills: 11    nitroGLYCERIN (NITROLINGUAL) 0.4 MG/SPRAY spray Place 1 spray under the tongue every 5 (five) minutes x 3 doses as needed for chest pain. Qty: 12 g, Refills: 1    omeprazole (PRILOSEC) 20 MG capsule Take 1 capsule (  20 mg total) by mouth daily. Qty: 90 capsule, Refills: 3    traMADol (ULTRAM) 50 MG tablet Take 2 tablets (100 mg total) by mouth 2 (two) times daily. Qty: 120 tablet, Refills: 5      STOP taking these medications     valsartan-hydrochlorothiazide (DIOVAN HCT) 320-12.5 MG tablet        Allergies  Allergen Reactions  . Adhesive [Tape] Rash  . Gabapentin Swelling    Swelling all over body   . Lyrica [Pregabalin] Swelling    Swelling all over body    Follow-up Information    Binnie Rail, MD. Schedule an appointment as soon as possible for a visit in 2 week(s).   Specialty:  Internal Medicine Contact information: [place] [address] [phone]        [facility] E, MD. Schedule an appointment as soon as possible for a visit in 1 week(s).   Specialty:  Gastroenterology Why:  f/u with Dr Watt Climes or Dr Penelope Coop next week. Contact information: [address] [place] [address] [phone]            The  results of significant diagnostics from this hospitalization (including imaging, microbiology, ancillary and laboratory) are listed below for reference.    Significant Diagnostic Studies: Ct Abdomen Pelvis W Contrast  Result Date: 07/29/2016 CLINICAL DATA:  Diabetes with history of renal cancer post right nephrectomy. Patient presents with weakness, lightheadedness and shortness of breath on exertion for a few days foot worse compared to the past few weeks. EXAM: CT ABDOMEN AND PELVIS WITH CONTRAST TECHNIQUE: Multidetector CT imaging of the abdomen and pelvis was performed using the standard protocol following bolus administration of intravenous contrast. CONTRAST:  9mL ISOVUE-300 IOPAMIDOL (ISOVUE-300) INJECTION 61%, 40mL ISOVUE-300 IOPAMIDOL (ISOVUE-300) INJECTION 61% COMPARISON:  09/25/2013 FINDINGS: Lower chest: Minimal linear scarring in the right base. Hepatobiliary: Within normal. Pancreas: Within normal. Spleen: Within normal. Adrenals/Urinary Tract: Adrenal glands are normal. Evidence of patient's previous right nephrectomy for renal cell cancer. Left kidney is normal in size without hydronephrosis. Left ureter is within normal. Bladder is normal. Stomach/Bowel: Stomach and small bowel are within normal. Appendix is not well visualized. There is mild fecal retention throughout the colon which is otherwise within normal. Vascular/Lymphatic: Moderate calcified plaque over the abdominal aorta and iliac arteries. No significant aneurysmal dilatation of the abdominal aorta as there is mild mural thrombus present. Mild calcified plaque at the origin of the left renal artery. No significant adenopathy. Reproductive: Previous hysterectomy.  Ovaries not well visualized. Other: No significant free fluid or focal inflammatory change. Musculoskeletal: Mild degenerate change of the spine and hips. IMPRESSION: No acute findings in the abdomen/ pelvis. Previous right nephrectomy. Aortic atherosclerosis.  Electronically Signed   By: Marin Olp M.D.   On: 07/29/2016 16:11    Microbiology: No results found for this or any previous visit (from the past 240 hour(s)).   Labs: Basic Metabolic Panel:  Recent Labs Lab 07/28/16 0940 07/28/16 1109 07/29/16 0606 07/29/16 0639 07/30/16 0640  NA 140 138 137  --  134*  K 3.7 3.1* 3.6  --  3.2*  CL 102 101 103  --  97*  CO2 29 27 26   --  29  GLUCOSE 94 76 76  --  91  BUN 17 17 12   --  12  CREATININE 1.00 1.04* 0.69  --  0.78  CALCIUM 9.3 9.2 9.0  --  9.3  MG  --  1.7  --  1.5* 2.0   Liver Function Tests:  Recent Labs Lab 07/28/16 0940  AST 43*  ALT 41*  ALKPHOS 70  BILITOT 0.4  PROT 7.1  ALBUMIN 4.0   No results for input(s): LIPASE, AMYLASE in the last 168 hours. No results for input(s): AMMONIA in the last 168 hours. CBC:  Recent Labs Lab 07/28/16 0940 07/28/16 1109 07/29/16 0606 07/30/16 0640  WBC 7.1 5.4 6.3 7.7  NEUTROABS 4.0 2.7  --   --   HGB 4.6 Repeated and verified X2.* 4.3* 7.3* 11.4*  HCT 15.6 Repeated and verified X2.* 16.1* 24.0* 36.3  MCV 60.8* 64.4* 71.9* 75.5*  PLT 247.0 228 178 148*   Cardiac Enzymes: No results for input(s): CKTOTAL, CKMB, CKMBINDEX, TROPONINI in the last 168 hours. BNP: BNP (last 3 results) No results for input(s): BNP in the last 8760 hours.  ProBNP (last 3 results) No results for input(s): PROBNP in the last 8760 hours.  CBG:  Recent Labs Lab 07/29/16 1128 07/29/16 1727 07/29/16 2034 07/30/16 0758 07/30/16 1217  GLUCAP 147* 118* 89 101* 226*       Signed:  Charon Smedberg MD.  [facility] 07/30/2016, 12:53 PM

## 2016-07-30 NOTE — Progress Notes (Signed)
Theresa Cline 10:07 AM  Subjective: Patient not feeling as well today and not in very good spirits and complains of headache but no new GI symptoms  Objective: Vital signs stable afebrile no acute distress abdomen is soft nontender hemoglobin increased nicely CT okay  Assessment: Iron deficiency question will etiology  Plan: Okay with me to go home and have outpatient GI workup and she can follow-up next week with either myself or Dr. Penelope Coop and I discussed the plan with her and she agrees and she will discuss further with hospital team  [facility] E  Pager [phone] After 5PM or if no answer call [phone]

## 2016-07-30 NOTE — Accreditation Note (Signed)
Pt discharged to home. Dc instructions given using teach back  with daughter video-recording on phone. Prescriptions given for 2 Bp meds. Pt encouraged to pick up prescriptions and start taking meds as ordered. Pt also encouraged to frequently check bp and keep a log of measurements  especially prior to taking bp meds since, according to pt and daughter, pt does not have high blood pressure. Pt and daughter voiced understanding. Let unit in wheelchair pushed by nurse tech. Left in good condition. No concerns or complaints. VWilliams,rn.

## 2016-07-31 LAB — TYPE AND SCREEN
ABO/RH(D): O POS
Antibody Screen: NEGATIVE
UNIT DIVISION: 0
UNIT DIVISION: 0
UNIT DIVISION: 0
Unit division: 0

## 2016-08-04 DIAGNOSIS — K210 Gastro-esophageal reflux disease with esophagitis: Secondary | ICD-10-CM | POA: Diagnosis not present

## 2016-08-04 DIAGNOSIS — R1084 Generalized abdominal pain: Secondary | ICD-10-CM | POA: Diagnosis not present

## 2016-08-04 DIAGNOSIS — D500 Iron deficiency anemia secondary to blood loss (chronic): Secondary | ICD-10-CM | POA: Diagnosis not present

## 2016-08-04 DIAGNOSIS — M791 Myalgia: Secondary | ICD-10-CM | POA: Diagnosis not present

## 2016-08-11 ENCOUNTER — Ambulatory Visit (INDEPENDENT_AMBULATORY_CARE_PROVIDER_SITE_OTHER): Payer: Medicare Other | Admitting: Internal Medicine

## 2016-08-11 ENCOUNTER — Encounter: Payer: Self-pay | Admitting: Internal Medicine

## 2016-08-11 VITALS — BP 132/76 | HR 76 | Temp 98.0°F | Resp 20 | Wt 138.0 lb

## 2016-08-11 DIAGNOSIS — E118 Type 2 diabetes mellitus with unspecified complications: Secondary | ICD-10-CM | POA: Diagnosis not present

## 2016-08-11 DIAGNOSIS — D649 Anemia, unspecified: Secondary | ICD-10-CM

## 2016-08-11 DIAGNOSIS — I10 Essential (primary) hypertension: Secondary | ICD-10-CM

## 2016-08-11 DIAGNOSIS — K219 Gastro-esophageal reflux disease without esophagitis: Secondary | ICD-10-CM | POA: Diagnosis not present

## 2016-08-11 MED ORDER — METFORMIN HCL 500 MG PO TABS: 500.0000 mg | ORAL_TABLET | Freq: Every day | ORAL | 3 refills | Status: DC

## 2016-08-11 MED ORDER — NITROGLYCERIN 0.4 MG/SPRAY TL SOLN: 1.0000 | 1 refills | Status: DC | PRN

## 2016-08-11 NOTE — Assessment & Plan Note (Signed)
BP well controlled Current regimen effective and well tolerated Continue current medications at current doses  

## 2016-08-11 NOTE — Assessment & Plan Note (Signed)
GERD controlled Continue daily medication  

## 2016-08-11 NOTE — Patient Instructions (Addendum)
  All other Health Maintenance issues reviewed.   All recommended immunizations and age-appropriate screenings are up-to-date or discussed.  No immunizations administered today.   Medications reviewed and updated.  Changes include changing metformin to once a day.    Please followup in 3 months

## 2016-08-11 NOTE — Progress Notes (Signed)
Subjective:    Patient ID: Theresa Cline, female    DOB: 01-21-1951, 65 y.o.   MRN: [id number]  HPI The patient is here for follow up from the hospital.  She was seen here 10/20 and I sent her to the ED for symptomatic severe anemia.  She was discharged 10/22.  She ws experiencing weakness, lightheadedness and SOB on exertion for a few days and it was getting worse.  There was no obvious bleeding.  She denied uncontrolled GERD.  Her hb was initially 4.3 and she was hemoccult negative.  She received 4 units of PRBCs and her Hb went up to 11.4.  She improved clinically.  She received IV iron.  GI saw her and a Ct of the Abd/Pelvis was done, which was negative.  She remained hemodynamically stable and her H/H was stable.  She ws discharged home and followed up with GI as an outpatient for further testing.  She will have an EGD and colonoscopy on 11/13.    Her blood pressure was elevated in the hospital and her BP medication was increased.  Her sugars were controlled during the hospital stay.   In February she fell and landed on her left knee.  Her knee has been swollen and painful since then.  She does have an orthopedic but has not seen him.    She has not smoked since being in the hospital.  She does not even crave any.    Hypertension: She is taking her medication daily. She is compliant with a low sodium diet.  She does monitor her blood pressure at home - 150/90's.Marland Kitchen    GERD:  She is taking her medication daily as prescribed.  She denies any GERD symptoms and feels her GERD is well controlled.    She just recently had blood work done by GI - she does not know the results yet.   Medications and allergies reviewed with patient and updated if appropriate.  Patient Active Problem List   Diagnosis Date Noted  . Dyspnea on exertion 07/29/2016  . Lightheadedness 07/29/2016  . Anemia, iron deficiency: Severe 07/29/2016  . Symptomatic anemia 07/28/2016  . DM type 2, controlled, with  complication ([place]) [id number]  . Osteopenia 03/15/2016  . Facet arthropathy, lumbosacral 02/24/2016  . GERD (gastroesophageal reflux disease) 02/09/2016  . 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 Right knee DJD 01/18/2012  . Trochanteric bursitis of both hips 12/11/2011  . Spinal stenosis, lumbar region, without neurogenic claudication 12/11/2011  . Cancer ([place])   . Syncope 07/17/2011  . EDEMA 07/12/2010  . Dizziness and giddiness 11/06/2008  . Diabetes ([place]) 10/15/2007  . Diabetic neuropathy associated with type 2 diabetes mellitus ([place]) 10/15/2007  . Mixed hyperlipidemia 10/15/2007  . TOBACCO ABUSE 10/15/2007  . Chronic pain syndrome 10/15/2007  . Essential hypertension 10/15/2007  . Coronary atherosclerosis 10/15/2007  . Osteoarthrosis, unspecified whether generalized or localized, involving lower leg 10/15/2007  . CARPAL TUNNEL RELEASE, BILATERAL, HX OF 08/09/1998    Current Outpatient Prescriptions on File Prior to Visit  Medication Sig Dispense Refill  . ACCU-CHEK FASTCLIX LANCETS MISC USE TO TEST BLOOD SUGAR THREE TIMES A DAY AND EVERY NIGHT AT BEDTIME 102 each 1  . ACCU-CHEK SMARTVIEW test strip USE TO TEST BLOOD SUGAR THREE TIMES A DAY AND EVERY NIGHT AT BEDTIME 100 each 1  . albuterol (PROAIR HFA) 108 (90 BASE) MCG/ACT inhaler Inhale 2 puffs by mouth every 6 hours as needed for wheezing or SOB. Please schedule an office  visit for more refills. 18 g 0  . aspirin EC 81 MG tablet Take 81 mg by mouth at bedtime.     Marland Kitchen atorvastatin (LIPITOR) 40 MG tablet TAKE 1 TABLET BY MOUTH ONCE DAILY 90 tablet 1  . BIOTIN PO Take 1 tablet by mouth daily with breakfast.    . Cholecalciferol (VITAMIN D) 2000 UNITS CAPS Take 2,000 Units by mouth daily with breakfast.     . diclofenac sodium (VOLTAREN) 1 % GEL Apply 2 g topically 4 (four) times daily. (Patient taking differently: Apply 2 g topically 4 (four) times daily as needed (for pain). ) 3 Tube 2  . DULoxetine (CYMBALTA) 30 MG capsule TAKE 1  CAPSULE BY MOUTH EVERY DAY 30 capsule 5  . hydrALAZINE (APRESOLINE) 25 MG tablet Take 3 tablets (75 mg total) by mouth 3 (three) times daily. 270 tablet 0  . influenza vac recom quadrivalent (FLUBLOK) 0.5 ML injection Inject 0.5 mLs into the muscle once.    Marland Kitchen ipratropium (ATROVENT) 0.06 % nasal spray INSTILL 2 SPRAYS IN EACH NOSTRIL FOUR TIMES A DAY (Patient taking differently: INSTILL 2 SPRAYS IN EACH NOSTRIL FOUR TIMES A DAY as needed for alleriges) 15 mL 2  . lidocaine (XYLOCAINE) 5 % ointment APPLY TOPICALLY THREE TIMES A DAY AS NEEDED (Patient taking differently: APPLY TOPICALLY THREE TIMES A DAY AS NEEDED for nerve pain) 100 g 1  . metFORMIN (GLUCOPHAGE) 500 MG tablet Take 1 tablet (500 mg total) by mouth 2 (two) times daily with a meal. 180 tablet 3  . metoprolol succinate (TOPROL-XL) 100 MG 24 hr tablet Take 1 tablet (100 mg total) by mouth daily. (Patient taking differently: Take 100 mg by mouth daily with breakfast. ) 30 tablet 11  . nitroGLYCERIN (NITROLINGUAL) 0.4 MG/SPRAY spray Place 1 spray under the tongue every 5 (five) minutes x 3 doses as needed for chest pain. 12 g 1  . omeprazole (PRILOSEC) 20 MG capsule Take 1 capsule (20 mg total) by mouth daily. (Patient taking differently: Take 20 mg by mouth daily with breakfast. ) 90 capsule 3  . traMADol (ULTRAM) 50 MG tablet Take 2 tablets (100 mg total) by mouth 2 (two) times daily. 120 tablet 5  . valsartan-hydrochlorothiazide (DIOVAN HCT) 320-25 MG tablet Take 1 tablet by mouth daily. 30 tablet 0   No current facility-administered medications on file prior to visit.     Past Medical History:  Diagnosis Date  . Anemia, iron deficiency: Severe 07/29/2016  . Anxiety   . Bronchitis, acute   . CAD (coronary artery disease)   . Cancer [facility])    Renal s/p nephrectomy  . Chronic pain syndrome   . Diabetes mellitus type II, controlled ([place])   . Diabetes type 2, controlled ([place])   . Diabetic peripheral neuropathy ([place])   . Encounter for  long-term (current) use of other medications   . GERD (gastroesophageal reflux disease)   . Hypertension   . 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 Mixed hyperlipidemia   . Obesity   . Osteoporosis   . Primary osteoarthritis of both knees   . S/P carpal tunnel release   . Sleep apnea    does not use CPAP  . Tobacco abuse   . Vertigo     Past Surgical History:  Procedure Laterality Date  . BREAST LUMPECTOMY WITH RADIOACTIVE SEED LOCALIZATION Left 11/30/2015   Procedure: BREAST LUMPECTOMY WITH RADIOACTIVE SEED LOCALIZATION;  Surgeon: Erroll Luna, MD;  Location: [facility];  Service: General;  Laterality: Left;  . CARPAL TUNNEL RELEASE Right 08/09/1998  . CARPAL TUNNEL RELEASE Left 09/08/1998  . CESAREAN SECTION     x3  . CORONARY ANGIOPLASTY WITH STENT PLACEMENT  2004   Normal LV function  . LIPOMA EXCISION Right    Right foot  . Resection of Left Acoustic Neuroma  1999  . TOTAL ABDOMINAL HYSTERECTOMY W/ BILATERAL SALPINGOOPHORECTOMY  04/12/1988   For fibroid tumors; [place], [place]    Social History   Social History  . Marital status: Divorced    Spouse name: N/A  . Number of children: N/A  . Years of education: N/A   Occupational History  . 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 Smoking status: Current Every Day Smoker    Packs/day: 0.25    Years: 40.00    Types: Cigarettes  . Smokeless tobacco: Never Used     Comment: smoking 4-5 cigs/day  . Alcohol use No  . Drug use: No  . Sexual activity: Not Asked   Other Topics Concern  . None   Social History Narrative   Divorced   Disability mainly for back and chronic intermittent vertigo - since tumor removed - no surgical correction for back   Lives alone, keeps her grandson, 33 yr old daughter is a mother    Family History  Problem Relation Age of Onset  . Leukemia Mother     CLL  . Diabetes type II Mother   . Other Mother      Hypercholesterolemia  . Osteoarthritis Mother   . COPD Mother   . Hypertension Mother   . Hodgkin's lymphoma Father   . Diabetes type II Sister   . Osteoporosis Sister   . Paranoid behavior Brother     Unsure if diagnosed with Schizophrenia  . Diabetes type II Sister   . Hypertension Sister   . Diabetes type II Sister   . Hypertension Sister   . Schizophrenia Sister   . Heart disease Sister     Enlarged heart  . Glaucoma Sister   . Alcohol abuse Sister   . Glaucoma Sister   . Alcohol abuse Sister   . Bipolar disorder Daughter   . Lupus Daughter     SLE  . Healthy Daughter     Review of Systems  Constitutional: Positive for unexpected weight change (lost 5 lbs in past 2 weeks - no change in eating). Negative for appetite change and fever.  Respiratory: Negative for cough, shortness of breath and wheezing.   Cardiovascular: Negative for chest pain, palpitations and leg swelling.  Gastrointestinal: Negative for abdominal pain, blood in stool (no black stool), constipation, diarrhea and nausea.       No gerd  Genitourinary: Negative for dysuria and hematuria.  Musculoskeletal: Positive for arthralgias.  Neurological: Negative for light-headedness and headaches.       Objective:   Vitals:   08/11/16 0926  BP: 132/76  Pulse: 76  Resp: 20  Temp: 98 F (36.7 C)   Filed Weights   08/11/16 0926  Weight: 138 lb (62.6 kg)   Body mass index is 24.45 kg/m.   Physical Exam    Constitutional: Appears well-developed and well-nourished. No distress.  HENT:  Head: Normocephalic and atraumatic.  Neck: Neck supple. No tracheal deviation present. No thyromegaly present.  No cervical lymphadenopathy Cardiovascular: Normal rate, regular rhythm and normal heart sounds.  No murmur heard. No carotid bruit .  Mil b/l LE edema Pulmonary/Chest: Effort normal and breath sounds normal. No respiratory distress. No has no wheezes. No rales.  Skin: Skin is warm and dry. Not diaphoretic.   Psychiatric: Normal mood and affect. Behavior is normal.     Assessment & Plan:    See Problem List for Assessment and Plan of chronic medical problems.    F/u in 3 months

## 2016-08-11 NOTE — Assessment & Plan Note (Signed)
Very well controlled Decrease metformin to 500 mg once a day Continue low sugar diet

## 2016-08-11 NOTE — Assessment & Plan Note (Signed)
Received 4 units of PRBCs and symptoms resolved She feels well now No known cause of anemia Had H/H recently done by GI - results unknown Scheduled to have EGD/Colonoscopy this month Further evaluation depending on EGD/Colonoscopy results

## 2016-08-15 ENCOUNTER — Other Ambulatory Visit: Payer: Self-pay | Admitting: Physical Medicine & Rehabilitation

## 2016-08-15 ENCOUNTER — Other Ambulatory Visit: Payer: Self-pay | Admitting: Cardiovascular Disease

## 2016-08-16 ENCOUNTER — Other Ambulatory Visit: Payer: Self-pay | Admitting: Family Medicine

## 2016-08-21 DIAGNOSIS — K5730 Diverticulosis of large intestine without perforation or abscess without bleeding: Secondary | ICD-10-CM | POA: Diagnosis not present

## 2016-08-21 DIAGNOSIS — K449 Diaphragmatic hernia without obstruction or gangrene: Secondary | ICD-10-CM | POA: Diagnosis not present

## 2016-08-21 DIAGNOSIS — D509 Iron deficiency anemia, unspecified: Secondary | ICD-10-CM | POA: Diagnosis not present

## 2016-08-21 LAB — HM COLONOSCOPY

## 2016-09-05 ENCOUNTER — Other Ambulatory Visit: Payer: Self-pay | Admitting: Internal Medicine

## 2016-09-05 ENCOUNTER — Other Ambulatory Visit: Payer: Self-pay | Admitting: Physical Medicine & Rehabilitation

## 2016-09-05 DIAGNOSIS — M1712 Unilateral primary osteoarthritis, left knee: Secondary | ICD-10-CM | POA: Diagnosis not present

## 2016-09-08 DIAGNOSIS — H52223 Regular astigmatism, bilateral: Secondary | ICD-10-CM | POA: Diagnosis not present

## 2016-09-12 ENCOUNTER — Other Ambulatory Visit: Payer: Self-pay | Admitting: Family Medicine

## 2016-09-12 ENCOUNTER — Other Ambulatory Visit: Payer: Self-pay | Admitting: Physical Medicine & Rehabilitation

## 2016-09-14 ENCOUNTER — Encounter: Payer: Self-pay | Admitting: Internal Medicine

## 2016-09-14 ENCOUNTER — Ambulatory Visit: Payer: Medicare Other | Admitting: Podiatry

## 2016-09-22 ENCOUNTER — Ambulatory Visit (INDEPENDENT_AMBULATORY_CARE_PROVIDER_SITE_OTHER): Payer: Medicare Other | Admitting: Podiatry

## 2016-09-22 ENCOUNTER — Encounter: Payer: Self-pay | Admitting: Podiatry

## 2016-09-22 ENCOUNTER — Ambulatory Visit (INDEPENDENT_AMBULATORY_CARE_PROVIDER_SITE_OTHER): Payer: Medicare Other

## 2016-09-22 DIAGNOSIS — E1141 Type 2 diabetes mellitus with diabetic mononeuropathy: Secondary | ICD-10-CM | POA: Diagnosis not present

## 2016-09-22 DIAGNOSIS — L84 Corns and callosities: Secondary | ICD-10-CM | POA: Diagnosis not present

## 2016-09-22 DIAGNOSIS — E1140 Type 2 diabetes mellitus with diabetic neuropathy, unspecified: Secondary | ICD-10-CM

## 2016-09-22 DIAGNOSIS — M2041 Other hammer toe(s) (acquired), right foot: Secondary | ICD-10-CM

## 2016-09-22 DIAGNOSIS — E1149 Type 2 diabetes mellitus with other diabetic neurological complication: Secondary | ICD-10-CM

## 2016-09-22 DIAGNOSIS — Q828 Other specified congenital malformations of skin: Secondary | ICD-10-CM | POA: Diagnosis not present

## 2016-09-28 DIAGNOSIS — M1712 Unilateral primary osteoarthritis, left knee: Secondary | ICD-10-CM | POA: Diagnosis not present

## 2016-09-28 DIAGNOSIS — M25562 Pain in left knee: Secondary | ICD-10-CM | POA: Diagnosis not present

## 2016-10-12 ENCOUNTER — Other Ambulatory Visit: Payer: Self-pay | Admitting: Physical Medicine & Rehabilitation

## 2016-10-12 ENCOUNTER — Other Ambulatory Visit: Payer: Self-pay | Admitting: Family Medicine

## 2016-10-12 ENCOUNTER — Other Ambulatory Visit: Payer: Self-pay | Admitting: Cardiovascular Disease

## 2016-10-16 ENCOUNTER — Other Ambulatory Visit: Payer: Self-pay | Admitting: Internal Medicine

## 2016-11-15 ENCOUNTER — Ambulatory Visit: Payer: Medicare Other | Admitting: Internal Medicine

## 2016-11-21 ENCOUNTER — Other Ambulatory Visit: Payer: Self-pay | Admitting: Internal Medicine

## 2016-11-22 ENCOUNTER — Other Ambulatory Visit: Payer: Self-pay | Admitting: *Deleted

## 2016-11-22 ENCOUNTER — Ambulatory Visit: Payer: Medicare Other | Admitting: Internal Medicine

## 2016-11-22 MED ORDER — NITROGLYCERIN 0.4 MG/SPRAY TL SOLN: 1.0000 | 1 refills | Status: DC | PRN

## 2016-11-29 DIAGNOSIS — D500 Iron deficiency anemia secondary to blood loss (chronic): Secondary | ICD-10-CM | POA: Diagnosis not present

## 2016-11-29 LAB — CBC AND DIFFERENTIAL
HCT: 43 % (ref 36–46)
HEMOGLOBIN: 14 g/dL (ref 12.0–16.0)
Platelets: 205 10*3/uL (ref 150–399)
WBC: 7.7 10^3/mL

## 2016-12-01 ENCOUNTER — Encounter: Payer: Self-pay | Admitting: Cardiovascular Disease

## 2016-12-01 ENCOUNTER — Ambulatory Visit (INDEPENDENT_AMBULATORY_CARE_PROVIDER_SITE_OTHER): Payer: Medicare Other | Admitting: Cardiovascular Disease

## 2016-12-01 ENCOUNTER — Encounter (INDEPENDENT_AMBULATORY_CARE_PROVIDER_SITE_OTHER): Payer: Self-pay

## 2016-12-01 VITALS — BP 126/76 | HR 76 | Ht 63.0 in | Wt 148.0 lb

## 2016-12-01 DIAGNOSIS — I714 Abdominal aortic aneurysm, without rupture, unspecified: Secondary | ICD-10-CM

## 2016-12-01 DIAGNOSIS — I739 Peripheral vascular disease, unspecified: Secondary | ICD-10-CM

## 2016-12-01 DIAGNOSIS — I779 Disorder of arteries and arterioles, unspecified: Secondary | ICD-10-CM

## 2016-12-01 DIAGNOSIS — I10 Essential (primary) hypertension: Secondary | ICD-10-CM

## 2016-12-01 DIAGNOSIS — I2510 Atherosclerotic heart disease of native coronary artery without angina pectoris: Secondary | ICD-10-CM | POA: Diagnosis not present

## 2016-12-01 NOTE — Patient Instructions (Signed)
Medication Instructions:  Your physician recommends that you continue on your current medications as directed. Please refer to the Current Medication list given to you today.   Labwork: none  Testing/Procedures: Your physician has requested that you have a carotid duplex. This test is an ultrasound of the carotid arteries in your neck. It looks at blood flow through these arteries that supply the brain with blood. Allow one hour for this exam. There are no restrictions or special instructions.    Follow-Up: Your physician recommends that you schedule a follow-up appointment in: 12 months. Please call our office in about 9 months to schedule this appointment.     Any Other Special Instructions Will Be Listed Below (If Applicable).     If you need a refill on your cardiac medications before your next appointment, please call your pharmacy.   

## 2016-12-01 NOTE — Progress Notes (Signed)
Chief Complaint  Patient presents with  . Shortness of Breath    History of Present Illness: 66 yo AAF with h/o CAD s/p MI and stents x 3 in 2004 ([place], [place]), HTN, Hyperlipidemia, DM type II who presents today for cardiac follow up.  She is known to have mild carotid artery disease, last dopplers July 2015. She was thought to have a small AAA but last doppler in February 2016 only showed subtle enlargement of the abdominal aorta. She has vertigo and had an acoustic neuroma resected in 1999. She is on Lasix for chronic lower ext edema. Echo February 2014 with normal LV function, no valve issues. Stress myoview December 2016 with no ischemia.   She is here today for follow up. She is doing well overall. She has no chest pain or SOB.  Rare LE edema.   Primary Care Physician: Binnie Rail, MD  Past Medical History:  Diagnosis Date  . Anemia, iron deficiency: Severe 07/29/2016  . Anxiety   . Bronchitis, acute   . CAD (coronary artery disease)   . Cancer [facility])    Renal s/p nephrectomy  . Chronic pain syndrome   . Diabetes mellitus type II, controlled ([place])   . Diabetes type 2, controlled ([place])   . Diabetic peripheral neuropathy ([place])   . Encounter for long-term (current) use of other medications   . GERD (gastroesophageal reflux disease)   . Hypertension   . 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 Mixed hyperlipidemia   . Obesity   . Osteoporosis   . Primary osteoarthritis of both knees   . S/P carpal tunnel release   . Sleep apnea    does not use CPAP  . Tobacco abuse   . Vertigo     Past Surgical History:  Procedure Laterality Date  . BREAST LUMPECTOMY WITH RADIOACTIVE SEED LOCALIZATION Left 11/30/2015   Procedure: BREAST LUMPECTOMY WITH RADIOACTIVE SEED LOCALIZATION;  Surgeon: Erroll Luna, MD;  Location: [place];  Service: General;  Laterality: Left;  . CARPAL TUNNEL RELEASE Right 08/09/1998  . CARPAL TUNNEL RELEASE Left 09/08/1998  . CESAREAN  SECTION     x3  . CORONARY ANGIOPLASTY WITH STENT PLACEMENT  2004   Normal LV function  . LIPOMA EXCISION Right    Right foot  . Resection of Left Acoustic Neuroma  1999  . TOTAL ABDOMINAL HYSTERECTOMY W/ BILATERAL SALPINGOOPHORECTOMY  04/12/1988   For fibroid tumors; [place], [place]    Current Outpatient Prescriptions  Medication Sig Dispense Refill  . ACCU-CHEK FASTCLIX LANCETS MISC USE TO TEST BLOOD SUGAR THREE TIMES A DAY AND EVERY NIGHT AT BEDTIME 102 each 2  . ACCU-CHEK SMARTVIEW test strip USE TO TEST BLOOD SUGAR THREE TIMES A DAY AND EVERY NIGHT AT BEDTIME 100 each 2  . aspirin EC 81 MG tablet Take 81 mg by mouth at bedtime.     Marland Kitchen atorvastatin (LIPITOR) 40 MG tablet TAKE 1 TABLET BY MOUTH ONCE DAILY 90 tablet 1  . BIOTIN PO Take 1 tablet by mouth daily with breakfast.    . Cholecalciferol (VITAMIN D) 2000 UNITS CAPS Take 2,000 Units by mouth daily with breakfast.     . diclofenac sodium (VOLTAREN) 1 % GEL Apply 2 g topically 4 (four) times daily. (Patient taking differently: Apply 2 g topically 4 (four) times daily as needed (for pain). ) 3 Tube 2  . DULoxetine (CYMBALTA) 30 MG capsule TAKE 1 CAPSULE BY MOUTH ONCE DAILY 30 capsule  3  . hydrALAZINE (APRESOLINE) 25 MG tablet TAKE 3 TABLETS BY MOUTH THREE TIMES A DAY 270 tablet 5  . influenza vac recom quadrivalent (FLUBLOK) 0.5 ML injection Inject 0.5 mLs into the muscle once.    . lidocaine (XYLOCAINE) 5 % ointment APPLY TOPICALLY THREE TIMES A DAY AS NEEDED (Patient taking differently: APPLY TOPICALLY THREE TIMES A DAY AS NEEDED for nerve pain) 100 g 1  . metFORMIN (GLUCOPHAGE) 500 MG tablet Take 1 tablet (500 mg total) by mouth daily with breakfast. 90 tablet 3  . metoprolol succinate (TOPROL-XL) 100 MG 24 hr tablet TAKE 1 TABLET BY MOUTH EVERY DAY 30 tablet 1  . nitroGLYCERIN (NITROLINGUAL) 0.4 MG/SPRAY spray Place 1 spray under the tongue every 5 (five) minutes x 3 doses as needed for chest pain. 12 g 1  . omeprazole (PRILOSEC)  20 MG capsule Take 1 capsule (20 mg total) by mouth daily. (Patient taking differently: Take 20 mg by mouth daily with breakfast. ) 90 capsule 3  . traMADol (ULTRAM) 50 MG tablet TAKE 2 TABLET BY MOUTH TWICE DAILY **DO NOT EXCEED 4 TABLETS PER DAY** 120 tablet 1  . valsartan-hydrochlorothiazide (DIOVAN-HCT) 320-25 MG tablet TAKE 1 TABLET BY MOUTH ONCE DAILY 30 tablet 5   No current facility-administered medications for this visit.     Allergies  Allergen Reactions  . Adhesive [Tape] Rash  . Gabapentin Swelling    Swelling all over body   . Lyrica [Pregabalin] Swelling    Swelling all over body     Social History   Social History  . Marital status: Divorced    Spouse name: N/A  . Number of children: N/A  . Years of education: N/A   Occupational History  . 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 Smoking status: Former Smoker    Packs/day: 0.25    Years: 50.00    Types: Cigarettes  . Smokeless tobacco: Never Used     Comment: quit 07/2016  . Alcohol use No  . Drug use: No  . Sexual activity: Not on file   Other Topics Concern  . Not on file   Social History Narrative   Divorced   Disability mainly for back and chronic intermittent vertigo - since tumor removed - no surgical correction for back   Lives alone, keeps her grandson, 49 yr old daughter is a mother    Family History  Problem Relation Age of Onset  . Leukemia Mother     CLL  . Diabetes type II Mother   . Other Mother     Hypercholesterolemia  . Osteoarthritis Mother   . COPD Mother   . Hypertension Mother   . Hodgkin's lymphoma Father   . Diabetes type II Sister   . Osteoporosis Sister   . Paranoid behavior Brother     Unsure if diagnosed with Schizophrenia  . Diabetes type II Sister   . Hypertension Sister   . Diabetes type II Sister   . Hypertension Sister   . Schizophrenia Sister   . Heart disease Sister     Enlarged heart  . Glaucoma  Sister   . Alcohol abuse Sister   . Glaucoma Sister   . Alcohol abuse Sister   . Bipolar disorder Daughter   . Lupus Daughter     SLE  . Healthy Daughter     Review of Systems:  As stated in the  HPI and otherwise negative.   BP 126/76   Pulse 76   Ht 5\' 3"  (1.6 m)   Wt 148 lb (67.1 kg)   BMI 26.22 kg/m   Physical Examination: General: Well developed, well nourished, NAD  HEENT: OP clear, mucus membranes moist  SKIN: warm, dry. No rashes. Neuro: No focal deficits  Musculoskeletal: Muscle strength 5/5 all ext  Psychiatric: Mood and affect normal  Neck: No JVD, no carotid bruits, no thyromegaly, no lymphadenopathy.  Lungs:Clear bilaterally, no wheezes, rhonci, crackles Cardiovascular: Regular rate and rhythm. No murmurs, gallops or rubs. Abdomen:Soft. Bowel sounds present. Non-tender.  Extremities: No lower extremity edema. Pulses are 2 + in the bilateral DP/PT.  Echo 11/18/12: Left ventricle: The cavity size was normal. Wall thickness was normal. Systolic function was normal. The estimated ejection fraction was in the range of 55% to 60%. Wall motion was normal; there were no regional wall motion abnormalities. Doppler parameters are consistent with abnormal left ventricular relaxation (grade 1 diastolic dysfunction). - Atrial septum: There was redundancy of the septum, with borderline criteria for aneurysm. - Pulmonary arteries: Systolic pressure was mildly increased. PA peak pressure: 13mm Hg (S).  EKG:  EKG is not ordered today. The ekg ordered today demonstrates   Recent Labs: 07/28/2016: ALT 41; TSH 2.36 07/30/2016: BUN 12; Creatinine, Ser 0.78; Hemoglobin 11.4; Magnesium 2.0; Platelets 148; Potassium 3.2; Sodium 134   Lipid Panel    Component Value Date/Time   CHOL 148 03/22/2015 1035   TRIG 76 03/22/2015 1035   HDL 59 03/22/2015 1035   CHOLHDL 2.5 03/22/2015 1035   VLDL 15 03/22/2015 1035   LDLCALC 74 03/22/2015 1035     Wt Readings from Last 3  Encounters:  12/01/16 148 lb (67.1 kg)  08/11/16 138 lb (62.6 kg)  07/28/16 144 lb (65.3 kg)     Other studies Reviewed: Additional studies/ records that were reviewed today include:  Review of the above records demonstrates:   Assessment and Plan:   1. CAD: No chest pain suggestive of angina. Continue current therapy. She is on an ASA, statin and beta blocker.   2. Carotid artery disease: Mild disease by dopplers July 2015.  Will repeat carotid dopplers now.   3. AAA: Known history of AAA. Mild by u/s February 2016.   Will not repeat at this time.   4. HTN: BP controlled.No changes.   Current medicines are reviewed at length with the patient today.  The patient does not have concerns regarding medicines.  The following changes have been made:  no change  Labs/ tests ordered today include:   No orders of the defined types were placed in this encounter.   Disposition:   She is moving to [place] in August. FU with me  in 1 year  Signed, Lauree Chandler, MD 12/01/2016 9:56 AM    [address], [place], [place]  [address] Phone: [phone]; Fax: [phone]

## 2016-12-07 ENCOUNTER — Encounter: Payer: Self-pay | Admitting: Internal Medicine

## 2016-12-20 ENCOUNTER — Other Ambulatory Visit: Payer: Self-pay | Admitting: Physical Medicine & Rehabilitation

## 2016-12-21 ENCOUNTER — Encounter: Payer: Self-pay | Admitting: Physical Medicine & Rehabilitation

## 2016-12-21 ENCOUNTER — Ambulatory Visit: Payer: Medicare Other | Admitting: Physical Medicine & Rehabilitation

## 2016-12-21 ENCOUNTER — Encounter: Payer: Medicare Other | Attending: Physical Medicine & Rehabilitation

## 2016-12-21 ENCOUNTER — Ambulatory Visit (HOSPITAL_BASED_OUTPATIENT_CLINIC_OR_DEPARTMENT_OTHER): Payer: Medicare Other | Admitting: Physical Medicine & Rehabilitation

## 2016-12-21 VITALS — BP 113/69 | HR 68

## 2016-12-21 DIAGNOSIS — D509 Iron deficiency anemia, unspecified: Secondary | ICD-10-CM | POA: Insufficient documentation

## 2016-12-21 DIAGNOSIS — M48061 Spinal stenosis, lumbar region without neurogenic claudication: Secondary | ICD-10-CM | POA: Diagnosis not present

## 2016-12-21 DIAGNOSIS — Z79899 Other long term (current) drug therapy: Secondary | ICD-10-CM | POA: Insufficient documentation

## 2016-12-21 DIAGNOSIS — E1142 Type 2 diabetes mellitus with diabetic polyneuropathy: Secondary | ICD-10-CM | POA: Diagnosis not present

## 2016-12-21 DIAGNOSIS — Z85528 Personal history of other malignant neoplasm of kidney: Secondary | ICD-10-CM | POA: Insufficient documentation

## 2016-12-21 DIAGNOSIS — I2510 Atherosclerotic heart disease of native coronary artery without angina pectoris: Secondary | ICD-10-CM | POA: Diagnosis not present

## 2016-12-21 DIAGNOSIS — Z825 Family history of asthma and other chronic lower respiratory diseases: Secondary | ICD-10-CM | POA: Insufficient documentation

## 2016-12-21 DIAGNOSIS — Z8261 Family history of arthritis: Secondary | ICD-10-CM | POA: Insufficient documentation

## 2016-12-21 DIAGNOSIS — E782 Mixed hyperlipidemia: Secondary | ICD-10-CM | POA: Diagnosis not present

## 2016-12-21 DIAGNOSIS — K219 Gastro-esophageal reflux disease without esophagitis: Secondary | ICD-10-CM | POA: Diagnosis not present

## 2016-12-21 DIAGNOSIS — Z8249 Family history of ischemic heart disease and other diseases of the circulatory system: Secondary | ICD-10-CM | POA: Diagnosis not present

## 2016-12-21 DIAGNOSIS — Z833 Family history of diabetes mellitus: Secondary | ICD-10-CM | POA: Diagnosis not present

## 2016-12-21 DIAGNOSIS — Z6826 Body mass index (BMI) 26.0-26.9, adult: Secondary | ICD-10-CM | POA: Insufficient documentation

## 2016-12-21 DIAGNOSIS — Z87891 Personal history of nicotine dependence: Secondary | ICD-10-CM | POA: Insufficient documentation

## 2016-12-21 DIAGNOSIS — Z8262 Family history of osteoporosis: Secondary | ICD-10-CM | POA: Insufficient documentation

## 2016-12-21 DIAGNOSIS — Z806 Family history of leukemia: Secondary | ICD-10-CM | POA: Insufficient documentation

## 2016-12-21 DIAGNOSIS — E669 Obesity, unspecified: Secondary | ICD-10-CM | POA: Insufficient documentation

## 2016-12-21 DIAGNOSIS — G4730 Sleep apnea, unspecified: Secondary | ICD-10-CM | POA: Insufficient documentation

## 2016-12-21 DIAGNOSIS — I10 Essential (primary) hypertension: Secondary | ICD-10-CM | POA: Diagnosis not present

## 2016-12-21 DIAGNOSIS — M810 Age-related osteoporosis without current pathological fracture: Secondary | ICD-10-CM | POA: Insufficient documentation

## 2016-12-21 DIAGNOSIS — Z818 Family history of other mental and behavioral disorders: Secondary | ICD-10-CM | POA: Insufficient documentation

## 2016-12-21 DIAGNOSIS — M170 Bilateral primary osteoarthritis of knee: Secondary | ICD-10-CM | POA: Diagnosis not present

## 2016-12-21 DIAGNOSIS — G8929 Other chronic pain: Secondary | ICD-10-CM | POA: Insufficient documentation

## 2016-12-21 DIAGNOSIS — Z811 Family history of alcohol abuse and dependence: Secondary | ICD-10-CM | POA: Diagnosis not present

## 2016-12-21 DIAGNOSIS — Z905 Acquired absence of kidney: Secondary | ICD-10-CM | POA: Diagnosis not present

## 2016-12-21 MED ORDER — DULOXETINE HCL 30 MG PO CPEP: 30.0000 mg | ORAL_CAPSULE | Freq: Every day | ORAL | 5 refills | Status: DC

## 2016-12-21 MED ORDER — TRAMADOL HCL 50 MG PO TABS: 100.0000 mg | ORAL_TABLET | Freq: Two times a day (BID) | ORAL | 5 refills | Status: DC

## 2016-12-21 NOTE — Patient Instructions (Signed)
Discussed partial knee bends

## 2016-12-21 NOTE — Progress Notes (Signed)
Subjective:    Patient ID: Theresa Cline, female    DOB: Feb 01, 1951, 66 y.o.   MRN: [id number]  HPI Hospitalized in Oct 2017, for anemia, UGI/LGI negative Dx Fe def anemia On Fe and MVI  Using seated cycle 3 times a day 15 min per session Goal of walking more Left knee pain ~1 mo ago, saw Dr Veverly Fells and received a shot [place] in Feb 2017 none since  Plans to move to [place] in October Pain Inventory Average Pain 8 Pain Right Now 0 My pain is .  In the last 24 hours, has pain interfered with the following? General activity 5 Relation with others 7 Enjoyment of life 6 What TIME of day is your pain at its worst? . Sleep (in general) Poor  Pain is worse with: bending Pain improves with: medication Relief from Meds: 10  Mobility use a cane ability to climb steps?  no do you drive?  no  Function retired  Neuro/Psych trouble walking dizziness anxiety  Prior Studies Any changes since last visit?  no  Physicians involved in your care Any changes since last visit?  no   Family History  Problem Relation Age of Onset  . Leukemia Mother     CLL  . Diabetes type II Mother   . Other Mother     Hypercholesterolemia  . Osteoarthritis Mother   . COPD Mother   . Hypertension Mother   . Hodgkin's lymphoma Father   . Diabetes type II Sister   . Osteoporosis Sister   . Paranoid behavior Brother     Unsure if diagnosed with Schizophrenia  . Diabetes type II Sister   . Hypertension Sister   . Diabetes type II Sister   . Hypertension Sister   . Schizophrenia Sister   . Heart disease Sister     Enlarged heart  . Glaucoma Sister   . Alcohol abuse Sister   . Glaucoma Sister   . Alcohol abuse Sister   . Bipolar disorder Daughter   . Lupus Daughter     SLE  . Healthy Daughter    Social History   Social History  . Marital status: Divorced    Spouse name: N/A  . Number of children: N/A  . Years of education: N/A   Occupational History  . Hospitality in  hotel     Disabled  . Retail     Disabled   Social History Main Topics  . Smoking status: Former Smoker    Packs/day: 0.25    Years: 50.00    Types: Cigarettes  . Smokeless tobacco: Never Used     Comment: quit 07/2016  . Alcohol use No  . Drug use: No  . Sexual activity: Not on file   Other Topics Concern  . Not on file   Social History Narrative   Divorced   Disability mainly for back and chronic intermittent vertigo - since tumor removed - no surgical correction for back   Lives alone, keeps her grandson, 55 yr old daughter is a mother   Past Surgical History:  Procedure Laterality Date  . BREAST LUMPECTOMY WITH RADIOACTIVE SEED LOCALIZATION Left 11/30/2015   Procedure: BREAST LUMPECTOMY WITH RADIOACTIVE SEED LOCALIZATION;  Surgeon: Erroll Luna, MD;  Location: [facility];  Service: General;  Laterality: Left;  . CARPAL TUNNEL RELEASE Right 08/09/1998  . CARPAL TUNNEL RELEASE Left 09/08/1998  . CESAREAN SECTION     x3  . CORONARY ANGIOPLASTY WITH STENT PLACEMENT  2004  Normal LV function  . LIPOMA EXCISION Right    Right foot  . Resection of Left Acoustic Neuroma  1999  . TOTAL ABDOMINAL HYSTERECTOMY W/ BILATERAL SALPINGOOPHORECTOMY  04/12/1988   For fibroid tumors; [place], [place]   Past Medical History:  Diagnosis Date  . Anemia, iron deficiency: Severe 07/29/2016  . Anxiety   . Bronchitis, acute   . CAD (coronary artery disease)   . Cancer [facility])    Renal s/p nephrectomy  . Chronic pain syndrome   . Diabetes mellitus type II, controlled ([place])   . Diabetes type 2, controlled ([place])   . Diabetic peripheral neuropathy ([place])   . Encounter for long-term (current) use of other medications   . GERD (gastroesophageal reflux disease)   . Hypertension   . 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 Mixed hyperlipidemia   . Obesity   . Osteoporosis   . Primary osteoarthritis of both knees   . S/P carpal tunnel release   . Sleep apnea    does not use  CPAP  . Tobacco abuse   . Vertigo    There were no vitals taken for this visit.  Opioid Risk Score:   Fall Risk Score:  `1  Depression screen PHQ 2/9  Depression screen [facility] 2/9 07/28/2016 05/06/2015 09/25/2014  Decreased Interest 0 0 0  Down, Depressed, Hopeless 0 0 0  PHQ - 2 Score 0 0 0  Altered sleeping - 3 -  Tired, decreased energy - 0 -  Change in appetite - 3 -  Feeling bad or failure about yourself  - 0 -  Trouble concentrating - 2 -  Moving slowly or fidgety/restless - 0 -  Suicidal thoughts - 0 -  PHQ-9 Score - 8 -  Some recent data might be hidden    Review of Systems  Constitutional: Positive for appetite change.  HENT: Negative.   Eyes: Negative.   Respiratory: Positive for cough.   Cardiovascular: Negative.   Gastrointestinal: Negative.   Endocrine: Negative.   Genitourinary: Negative.   Musculoskeletal: Positive for joint swelling.  Skin: Negative.   Allergic/Immunologic: Negative.   Neurological: Negative.   Hematological: Bruises/bleeds easily.  Psychiatric/Behavioral: Negative.   All other systems reviewed and are negative.      Objective:   Physical Exam  Constitutional: She is oriented to person, place, and time. She appears well-developed and well-nourished.  HENT:  Head: Normocephalic and atraumatic.  Eyes: Conjunctivae and EOM are normal. Pupils are equal, round, and reactive to light.  Musculoskeletal: She exhibits no edema or deformity.       Lumbar back: She exhibits decreased range of motion. She exhibits no tenderness and no deformity.  Normal lumbar flexion, limited lumbar extension, normal lumbar lateral bending. Negative straight leg raising. No tenderness palpation in the lumbar paraspinal area or over the greater trochanters of the hip or over the gluteal musculature.  Neurological: She is alert and oriented to person, place, and time.  Psychiatric: She has a normal mood and affect.  Nursing note and vitals  reviewed.         Assessment & Plan:  1. Lumbar spinal stenosis with chronic lumbar pain, has functioned well without side effects from tramadol 50 mg 2 tablets twice a day as well as Cymbalta 30 mg per day. She has no signs of serotonin syndrome.  Follow-up in 6 months, continue to remain active, have asked her to do some partial squats in addition to her current exercise  program.

## 2016-12-25 ENCOUNTER — Ambulatory Visit (HOSPITAL_COMMUNITY)
Admission: RE | Admit: 2016-12-25 | Discharge: 2016-12-25 | Disposition: A | Discharge status: Home / Self Care | Payer: Medicare Other | Source: Ambulatory Visit | Attending: Cardiovascular Disease | Admitting: Cardiovascular Disease

## 2016-12-25 DIAGNOSIS — Z87891 Personal history of nicotine dependence: Secondary | ICD-10-CM | POA: Diagnosis not present

## 2016-12-25 DIAGNOSIS — E785 Hyperlipidemia, unspecified: Secondary | ICD-10-CM | POA: Insufficient documentation

## 2016-12-25 DIAGNOSIS — E119 Type 2 diabetes mellitus without complications: Secondary | ICD-10-CM | POA: Diagnosis not present

## 2016-12-25 DIAGNOSIS — I739 Peripheral vascular disease, unspecified: Secondary | ICD-10-CM

## 2016-12-25 DIAGNOSIS — I10 Essential (primary) hypertension: Secondary | ICD-10-CM | POA: Diagnosis not present

## 2016-12-25 DIAGNOSIS — I2510 Atherosclerotic heart disease of native coronary artery without angina pectoris: Secondary | ICD-10-CM | POA: Diagnosis not present

## 2016-12-25 DIAGNOSIS — I6523 Occlusion and stenosis of bilateral carotid arteries: Secondary | ICD-10-CM | POA: Diagnosis not present

## 2016-12-25 DIAGNOSIS — I779 Disorder of arteries and arterioles, unspecified: Secondary | ICD-10-CM | POA: Diagnosis not present

## 2017-01-11 ENCOUNTER — Other Ambulatory Visit: Payer: Self-pay | Admitting: Internal Medicine

## 2017-01-17 ENCOUNTER — Other Ambulatory Visit: Payer: Self-pay | Admitting: Cardiovascular Disease

## 2017-02-05 ENCOUNTER — Encounter: Payer: Self-pay | Admitting: Internal Medicine

## 2017-02-05 DIAGNOSIS — Z85528 Personal history of other malignant neoplasm of kidney: Secondary | ICD-10-CM | POA: Insufficient documentation

## 2017-02-05 NOTE — Patient Instructions (Addendum)
  Test(s) ordered today. Your results will be released to MyChart (or called to you) after review, usually within 72hours after test completion. If any changes need to be made, you will be notified at that same time.   No immunizations administered today.   Medications reviewed and updated.  /  No changes recommended at this time.    

## 2017-02-05 NOTE — Progress Notes (Signed)
Subjective:    Patient ID: Theresa Cline, female    DOB: [birth year], 66 y.o.   MRN: [id number]  HPI The patient is here for follow up.  CAD, Hypertension: She is taking her medication daily. She is compliant with a low sodium diet.  She denies chest pain, palpitations, edema, shortness of breath and regular headaches. She is exercising regularly - walking.  She does not monitor her blood pressure at home.    Diabetes: She is taking her medication daily as prescribed. She is compliant with a diabetic diet. She is walking regularly. She monitors her sugars and they have been running 93, 97, 107, 127 has been the highest. She is up-to-date with an ophthalmology examination.   Diabetic neuropathy:  She is taking cymbalta daily.  She checks her feet daily and denies foot lesions.   Hyperlipidemia: She is taking her medication daily. She is compliant with a low fat/cholesterol diet. She is exercising regularly - walking. She denies myalgias.   She walks with a cane.  She has intermittent vertigo and neuropathy and sometime loses her balance.   Medications and allergies reviewed with patient and updated if appropriate.  Patient Active Problem List   Diagnosis Date Noted  . History of renal cell carcinoma 02/05/2017  . Dyspnea on exertion 07/29/2016  . Lightheadedness 07/29/2016  . Anemia, iron deficiency: Severe 07/29/2016  . Symptomatic anemia 07/28/2016  . DM type 2, controlled, with complication ([place]) 95/62/1308  . Osteopenia 03/15/2016  . Facet arthropathy, lumbosacral ([place]) 02/24/2016  . GERD (gastroesophageal reflux disease) 02/09/2016  . Acoustic neuroma ([place]) 02/09/2016  . Right knee DJD 01/18/2012  . Trochanteric bursitis of both hips 12/11/2011  . Spinal stenosis, lumbar region, without neurogenic claudication 12/11/2011  . Syncope 07/17/2011  . EDEMA 07/12/2010  . Dizziness and giddiness 11/06/2008  . Diabetic neuropathy associated with type 2 diabetes mellitus ([place])  10/15/2007  . Mixed hyperlipidemia 10/15/2007  . Chronic pain syndrome 10/15/2007  . Essential hypertension 10/15/2007  . Coronary atherosclerosis 10/15/2007  . Osteoarthrosis, unspecified whether generalized or localized, involving lower leg 10/15/2007    Current Outpatient Prescriptions on File Prior to Visit  Medication Sig Dispense Refill  . ACCU-CHEK FASTCLIX LANCETS MISC USE TO TEST BLOOD SUGAR THREE TIMES A DAY AND EVERY NIGHT AT BEDTIME 102 each 2  . ACCU-CHEK SMARTVIEW test strip USE TO TEST BLOOD SUGAR THREE TIMES A DAY AND EVERY NIGHT AT BEDTIME 100 each 2  . aspirin EC 81 MG tablet Take 81 mg by mouth at bedtime.     Marland Kitchen atorvastatin (LIPITOR) 40 MG tablet TAKE 1 TABLET BY MOUTH ONCE DAILY 90 tablet 1  . BIOTIN PO Take 1 tablet by mouth daily with breakfast.    . Cholecalciferol (VITAMIN D) 2000 UNITS CAPS Take 2,000 Units by mouth daily with breakfast.     . diclofenac sodium (VOLTAREN) 1 % GEL Apply 2 g topically 4 (four) times daily. (Patient taking differently: Apply 2 g topically 4 (four) times daily as needed (for pain). ) 3 Tube 2  . DULoxetine (CYMBALTA) 30 MG capsule Take 1 capsule (30 mg total) by mouth daily. 30 capsule 5  . hydrALAZINE (APRESOLINE) 25 MG tablet TAKE 3 TABLETS BY MOUTH THREE TIMES A DAY 270 tablet 5  . lidocaine (XYLOCAINE) 5 % ointment APPLY TOPICALLY THREE TIMES A DAY AS NEEDED (Patient taking differently: APPLY TOPICALLY THREE TIMES A DAY AS NEEDED for nerve pain) 100 g 1  . metFORMIN (GLUCOPHAGE) 500 MG  tablet Take 1 tablet (500 mg total) by mouth daily with breakfast. 90 tablet 3  . metFORMIN (GLUCOPHAGE) 500 MG tablet TAKE 1 TABLET BY MOUTH TWICE DAILY WITH A MEAL 180 tablet 2  . metoprolol succinate (TOPROL-XL) 100 MG 24 hr tablet TAKE 1 TABLET BY MOUTH EVERY DAY 30 tablet 9  . nitroGLYCERIN (NITROLINGUAL) 0.4 MG/SPRAY spray Place 1 spray under the tongue every 5 (five) minutes x 3 doses as needed for chest pain. 12 g 1  . omeprazole (PRILOSEC) 20  MG capsule TAKE 1 CAPSULE BY MOUTH EVERY DAY 90 capsule 2  . traMADol (ULTRAM) 50 MG tablet Take 2 tablets (100 mg total) by mouth 2 (two) times daily. 120 tablet 5  . valsartan-hydrochlorothiazide (DIOVAN-HCT) 320-25 MG tablet TAKE 1 TABLET BY MOUTH ONCE DAILY 30 tablet 5   No current facility-administered medications on file prior to visit.     Past Medical History:  Diagnosis Date  . Anemia, iron deficiency: Severe 07/29/2016  . Anxiety   . Bronchitis, acute   . CAD (coronary artery disease)   . Cancer [facility])    Renal s/p nephrectomy  . CARPAL TUNNEL RELEASE, BILATERAL, HX OF 08/09/1998   Qualifier: Diagnosis of  By: Amil Amen MD, Benjamine Mola    . Chronic pain syndrome   . Diabetes mellitus type II, controlled ([place])   . Diabetes type 2, controlled ([place])   . Diabetic peripheral neuropathy ([place])   . Encounter for long-term (current) use of other medications   . GERD (gastroesophageal reflux disease)   . Hypertension   . 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 Mixed hyperlipidemia   . Obesity   . Osteoporosis   . Primary osteoarthritis of both knees   . S/P carpal tunnel release   . Sleep apnea    does not use CPAP  . Tobacco abuse   . Vertigo     Past Surgical History:  Procedure Laterality Date  . BREAST LUMPECTOMY WITH RADIOACTIVE SEED LOCALIZATION Left 11/30/2015   Procedure: BREAST LUMPECTOMY WITH RADIOACTIVE SEED LOCALIZATION;  Surgeon: Erroll Luna, MD;  Location: [place];  Service: General;  Laterality: Left;  . CARPAL TUNNEL RELEASE Right 08/09/1998  . CARPAL TUNNEL RELEASE Left 09/08/1998  . CESAREAN SECTION     x3  . CORONARY ANGIOPLASTY WITH STENT PLACEMENT  2004   Normal LV function  . LIPOMA EXCISION Right    Right foot  . Resection of Left Acoustic Neuroma  1999  . TOTAL ABDOMINAL HYSTERECTOMY W/ BILATERAL SALPINGOOPHORECTOMY  04/12/1988   For fibroid tumors; [place], [place]    Social History   Social History  . Marital status:  Divorced    Spouse name: N/A  . Number of children: N/A  . Years of education: N/A   Occupational History  . 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 Smoking status: Former Smoker    Packs/day: 0.25    Years: 50.00    Types: Cigarettes  . Smokeless tobacco: Never Used     Comment: quit 07/2016  . Alcohol use No  . Drug use: No  . Sexual activity: Not on file   Other Topics Concern  . Not on file   Social History Narrative   Divorced   Disability mainly for back and chronic intermittent vertigo - since tumor removed - no surgical correction for back   Lives alone, keeps  her grandson, 58 yr old daughter is a mother    Family History  Problem Relation Age of Onset  . Leukemia Mother     CLL  . Diabetes type II Mother   . Other Mother     Hypercholesterolemia  . Osteoarthritis Mother   . COPD Mother   . Hypertension Mother   . Hodgkin's lymphoma Father   . Diabetes type II Sister   . Osteoporosis Sister   . Paranoid behavior Brother     Unsure if diagnosed with Schizophrenia  . Diabetes type II Sister   . Hypertension Sister   . Diabetes type II Sister   . Hypertension Sister   . Schizophrenia Sister   . Heart disease Sister     Enlarged heart  . Glaucoma Sister   . Alcohol abuse Sister   . Glaucoma Sister   . Alcohol abuse Sister   . Bipolar disorder Daughter   . Lupus Daughter     SLE  . Healthy Daughter     Review of Systems  Constitutional: Negative for fever.  Respiratory: Negative for cough, shortness of breath and wheezing.   Cardiovascular: Negative for chest pain, palpitations and leg swelling.  Neurological: Negative for light-headedness and headaches.       Objective:   Vitals:   02/07/17 0839  BP: 128/74  Pulse: 75  Resp: 16  Temp: 98.5 F (36.9 C)   Wt Readings from Last 3 Encounters:  02/07/17 145 lb (65.8 kg)  12/01/16 148 lb (67.1 kg)  08/11/16 138 lb (62.6 kg)   Body mass  index is 25.69 kg/m.   Physical Exam    Constitutional: Appears well-developed and well-nourished. No distress.  HENT:  Head: Normocephalic and atraumatic.  Neck: Neck supple. No tracheal deviation present. No thyromegaly present.  No cervical lymphadenopathy Cardiovascular: Normal rate, regular rhythm and normal heart sounds.   No murmur heard. No carotid bruit .  No edema Pulmonary/Chest: Effort normal and breath sounds normal. No respiratory distress. No has no wheezes. No rales.  Skin: Skin is warm and dry. Not diaphoretic.  Psychiatric: Normal mood and affect. Behavior is normal.      Assessment & Plan:    See Problem List for Assessment and Plan of chronic medical problems.

## 2017-02-07 ENCOUNTER — Encounter: Payer: Self-pay | Admitting: Internal Medicine

## 2017-02-07 ENCOUNTER — Ambulatory Visit (INDEPENDENT_AMBULATORY_CARE_PROVIDER_SITE_OTHER): Payer: Medicare Other | Admitting: Internal Medicine

## 2017-02-07 ENCOUNTER — Other Ambulatory Visit (INDEPENDENT_AMBULATORY_CARE_PROVIDER_SITE_OTHER): Payer: Medicare Other

## 2017-02-07 VITALS — BP 128/74 | HR 75 | Temp 98.5°F | Resp 16 | Wt 145.0 lb

## 2017-02-07 DIAGNOSIS — E782 Mixed hyperlipidemia: Secondary | ICD-10-CM

## 2017-02-07 DIAGNOSIS — I2510 Atherosclerotic heart disease of native coronary artery without angina pectoris: Secondary | ICD-10-CM

## 2017-02-07 DIAGNOSIS — E118 Type 2 diabetes mellitus with unspecified complications: Secondary | ICD-10-CM

## 2017-02-07 DIAGNOSIS — E1141 Type 2 diabetes mellitus with diabetic mononeuropathy: Secondary | ICD-10-CM | POA: Diagnosis not present

## 2017-02-07 DIAGNOSIS — I10 Essential (primary) hypertension: Secondary | ICD-10-CM

## 2017-02-07 LAB — COMPREHENSIVE METABOLIC PANEL
ALT: 19 U/L (ref 0–35)
AST: 24 U/L (ref 0–37)
Albumin: 4.1 g/dL (ref 3.5–5.2)
Alkaline Phosphatase: 59 U/L (ref 39–117)
BUN: 25 mg/dL — AB (ref 6–23)
CHLORIDE: 100 meq/L (ref 96–112)
CO2: 34 mEq/L — ABNORMAL HIGH (ref 19–32)
Calcium: 10.4 mg/dL (ref 8.4–10.5)
Creatinine, Ser: 1.03 mg/dL (ref 0.40–1.20)
GFR: 68.49 mL/min (ref >60.00)
Glucose, Bld: 111 mg/dL — ABNORMAL HIGH (ref 70–99)
POTASSIUM: 4.2 meq/L (ref 3.5–5.1)
SODIUM: 138 meq/L (ref 135–145)
Total Bilirubin: 0.3 mg/dL (ref 0.2–1.2)
Total Protein: 7.5 g/dL (ref 6.0–8.3)

## 2017-02-07 LAB — LIPID PANEL
CHOLESTEROL: 157 mg/dL (ref 0–200)
HDL: 53.2 mg/dL (ref >39.00)
LDL Cholesterol: 88 mg/dL (ref 0–99)
NONHDL: 103.31
Total CHOL/HDL Ratio: 3
Triglycerides: 75 mg/dL (ref 0.0–149.0)
VLDL: 15 mg/dL (ref 0.0–40.0)

## 2017-02-07 LAB — CBC WITH DIFFERENTIAL/PLATELET
BASOS PCT: 1 % (ref 0.0–3.0)
Basophils Absolute: 0.1 10*3/uL (ref 0.0–0.1)
EOS PCT: 3 % (ref 0.0–5.0)
Eosinophils Absolute: 0.2 10*3/uL (ref 0.0–0.7)
HEMATOCRIT: 39.6 % (ref 36.0–46.0)
HEMOGLOBIN: 12.9 g/dL (ref 12.0–15.0)
Lymphocytes Relative: 21.5 % (ref 12.0–46.0)
Lymphs Abs: 1.6 10*3/uL (ref 0.7–4.0)
MCHC: 32.6 g/dL (ref 30.0–36.0)
MCV: 89.4 fl (ref 78.0–100.0)
MONO ABS: 0.8 10*3/uL (ref 0.1–1.0)
MONOS PCT: 10.4 % (ref 3.0–12.0)
Neutro Abs: 4.8 10*3/uL (ref 1.4–7.7)
Neutrophils Relative %: 64.1 % (ref 43.0–77.0)
Platelets: 208 10*3/uL (ref 150.0–400.0)
RBC: 4.43 Mil/uL (ref 3.87–5.11)
RDW: 14.4 % (ref 11.5–15.5)
WBC: 7.6 10*3/uL (ref 4.0–10.5)

## 2017-02-07 LAB — HEMOGLOBIN A1C: HEMOGLOBIN A1C: 6.7 % — AB (ref 4.6–6.5)

## 2017-02-07 NOTE — Assessment & Plan Note (Signed)
No angina or sob Continue current medications

## 2017-02-07 NOTE — Assessment & Plan Note (Addendum)
BP well controlled Current regimen effective and well tolerated Continue current medications at current doses cmp  

## 2017-02-07 NOTE — Assessment & Plan Note (Signed)
Check a1c Sugars well controlled at home Low sugar / carb diet Stressed regular exercise

## 2017-02-07 NOTE — Assessment & Plan Note (Signed)
Following with podiatry Taking cymbalta Checking feet daily

## 2017-02-07 NOTE — Progress Notes (Signed)
Pre visit review using our clinic review tool, if applicable. No additional management support is needed unless otherwise documented below in the visit note. 

## 2017-02-07 NOTE — Assessment & Plan Note (Signed)
Check lipid panel  Continue daily statin Regular exercise and healthy diet encouraged  

## 2017-02-16 IMAGING — NM NM MYOCAR MULTI W/ SPECT
3 series · 18 of 18 positions shown · non-contrast
Comparison: none

[Series 1: rest_(id)_sa · 6.5mm · 6.51mm/px · 6 of 64 frames shown]
[frame 6/64]
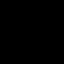
[frame 16/64]
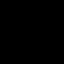
[frame 27/64]
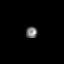
[frame 38/64]
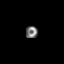
[frame 48/64]
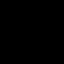
[frame 59/64]
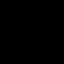

[Series 1: stress_(id)_sa · 6.5mm · 6.51mm/px · 6 of 512 frames shown (1 of 2)]
[frame 43/512]
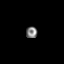
[frame 128/512]
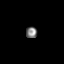
[frame 214/512]
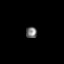
[frame 299/512]
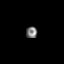
[frame 384/512]
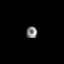
[frame 470/512]
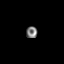

[Series 1: stress_(id)_sa · 6.5mm · 6.51mm/px · 6 of 64 frames shown (2 of 2)]
[frame 6/64]
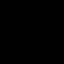
[frame 16/64]
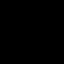
[frame 27/64]
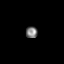
[frame 38/64]
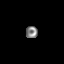
[frame 48/64]
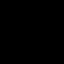
[frame 59/64]
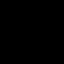

[18 of 18 positions shown; findings below may reference images not displayed]

Canned report from images found in remote index.

Refer to host system for actual result text.

## 2017-02-19 ENCOUNTER — Other Ambulatory Visit: Payer: Self-pay | Admitting: *Deleted

## 2017-02-19 MED ORDER — METOPROLOL SUCCINATE ER 100 MG PO TB24: 100.0000 mg | ORAL_TABLET | Freq: Every day | ORAL | 1 refills | Status: DC

## 2017-02-19 MED ORDER — METFORMIN HCL 500 MG PO TABS: 500.0000 mg | ORAL_TABLET | Freq: Every day | ORAL | 1 refills | Status: DC

## 2017-02-19 MED ORDER — HYDRALAZINE HCL 25 MG PO TABS: ORAL_TABLET | ORAL | 5 refills | Status: DC

## 2017-02-19 MED ORDER — OMEPRAZOLE 20 MG PO CPDR: DELAYED_RELEASE_CAPSULE | ORAL | 1 refills | Status: DC

## 2017-02-19 MED ORDER — VALSARTAN-HYDROCHLOROTHIAZIDE 320-25 MG PO TABS: 1.0000 | ORAL_TABLET | Freq: Every day | ORAL | 1 refills | Status: DC

## 2017-02-19 MED ORDER — DULOXETINE HCL 30 MG PO CPEP: 30.0000 mg | ORAL_CAPSULE | Freq: Every day | ORAL | 1 refills | Status: DC

## 2017-02-19 NOTE — Telephone Encounter (Signed)
Tramadol comes from Dr Letta Pate- she needs to get this from him.
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
## 2017-02-19 NOTE — Telephone Encounter (Signed)
Notified pt w/MD response.../lmb 

## 2017-02-27 DIAGNOSIS — M1712 Unilateral primary osteoarthritis, left knee: Secondary | ICD-10-CM | POA: Diagnosis not present

## 2017-03-08 MED ORDER — ATORVASTATIN CALCIUM 40 MG PO TABS: 40.0000 mg | ORAL_TABLET | Freq: Every day | ORAL | 1 refills | Status: DC

## 2017-03-08 NOTE — Addendum Note (Signed)
Addended by: Earnstine Regal on: 03/08/2017 03:34 PM   Modules accepted: Orders

## 2017-03-08 NOTE — Telephone Encounter (Signed)
Rec'd call pt states she called last week still need her lipitor & tramadol. Inform pt per MD tramadol must be filled by dr. Sherre Poot, but I can send in the lipitor...Theresa Cline

## 2017-03-09 ENCOUNTER — Other Ambulatory Visit: Payer: Self-pay | Admitting: *Deleted

## 2017-03-09 MED ORDER — TRAMADOL HCL 50 MG PO TABS: 100.0000 mg | ORAL_TABLET | Freq: Two times a day (BID) | ORAL | 5 refills | Status: DC

## 2017-03-21 ENCOUNTER — Ambulatory Visit (INDEPENDENT_AMBULATORY_CARE_PROVIDER_SITE_OTHER): Payer: Medicare Other | Admitting: *Deleted

## 2017-03-21 VITALS — BP 119/72 | HR 61 | Resp 20 | Ht 63.0 in | Wt 148.0 lb

## 2017-03-21 DIAGNOSIS — Z23 Encounter for immunization: Secondary | ICD-10-CM | POA: Diagnosis not present

## 2017-03-21 DIAGNOSIS — Z0000 Encounter for general adult medical examination without abnormal findings: Secondary | ICD-10-CM | POA: Diagnosis not present

## 2017-03-21 NOTE — Progress Notes (Addendum)
Subjective:   Theresa Cline is a 66 y.o. female who presents for an Initial Medicare Annual Wellness Visit.  Review of Systems    No ROS.  Medicare Wellness Visit. Additional risk factors are reflected in the social history.   Sleep patterns: has frequent nighttime awakenings and sleeps 4 hours nightly. Patient reports insomnia issues, discussed recommended sleep tips and stress reduction tips, education was attached to patient's AVS.    Home Safety/Smoke Alarms: Feels safe in home. Smoke alarms in place.  Living environment; residence and Firearm Safety: 1-story house/ trailer, equipment: Cane, Type: Wide [employer], no firearms. Lives in [place] complex, has family support Seat Belt Safety/Bike Helmet: Wears seat belt.   Counseling:   Eye Exam- appointment yearly Dental- appointment yearly  Female:   Pap- N/A     Mammo- Last 08/20/15, recall 2 years     Dexa scan- Last 03/15/16       CCS- Last 08/21/16, recall 10 years    Objective:    There were no vitals filed for this visit. There is no height or weight on file to calculate BMI.   Current Medications (verified) Outpatient Encounter Prescriptions as of 03/21/2017  Medication Sig  . ACCU-CHEK FASTCLIX LANCETS MISC USE TO TEST BLOOD SUGAR THREE TIMES A DAY AND EVERY NIGHT AT BEDTIME  . ACCU-CHEK SMARTVIEW test strip USE TO TEST BLOOD SUGAR THREE TIMES A DAY AND EVERY NIGHT AT BEDTIME  . aspirin EC 81 MG tablet Take 81 mg by mouth at bedtime.   Marland Kitchen atorvastatin (LIPITOR) 40 MG tablet Take 1 tablet (40 mg total) by mouth daily.  Marland Kitchen BIOTIN PO Take 1 tablet by mouth daily with breakfast.  . Cholecalciferol (VITAMIN D) 2000 UNITS CAPS Take 2,000 Units by mouth daily with breakfast.   . diclofenac sodium (VOLTAREN) 1 % GEL Apply 2 g topically 4 (four) times daily. (Patient taking differently: Apply 2 g topically 4 (four) times daily as needed (for pain). )  . DULoxetine (CYMBALTA) 30 MG capsule Take 1 capsule (30 mg  total) by mouth daily.  . hydrALAZINE (APRESOLINE) 25 MG tablet TAKE 3 TABLETS BY MOUTH THREE TIMES A DAY  . lidocaine (XYLOCAINE) 5 % ointment APPLY TOPICALLY THREE TIMES A DAY AS NEEDED (Patient taking differently: APPLY TOPICALLY THREE TIMES A DAY AS NEEDED for nerve pain)  . metFORMIN (GLUCOPHAGE) 500 MG tablet TAKE 1 TABLET BY MOUTH TWICE DAILY WITH A MEAL  . metFORMIN (GLUCOPHAGE) 500 MG tablet Take 1 tablet (500 mg total) by mouth daily with breakfast.  . metoprolol succinate (TOPROL-XL) 100 MG 24 hr tablet Take 1 tablet (100 mg total) by mouth daily. Take with or immediately following a meal.  . nitroGLYCERIN (NITROLINGUAL) 0.4 MG/SPRAY spray Place 1 spray under the tongue every 5 (five) minutes x 3 doses as needed for chest pain.  Marland Kitchen omeprazole (PRILOSEC) 20 MG capsule TAKE 1 CAPSULE BY MOUTH EVERY DAY  . traMADol (ULTRAM) 50 MG tablet Take 2 tablets (100 mg total) by mouth 2 (two) times daily.  . valsartan-hydrochlorothiazide (DIOVAN-HCT) 320-25 MG tablet Take 1 tablet by mouth daily.   No facility-administered encounter medications on file as of 03/21/2017.     Allergies (verified) Adhesive [tape]; Gabapentin; and Lyrica [pregabalin]   History: Past Medical History:  Diagnosis Date  . Anemia, iron deficiency: Severe 07/29/2016  . Anxiety   . Bronchitis, acute   . CAD (coronary artery disease)   . Cancer [facility])    Renal s/p  nephrectomy  . CARPAL TUNNEL RELEASE, BILATERAL, HX OF 08/09/1998   Qualifier: Diagnosis of  By: Amil Amen MD, Benjamine Mola    . Chronic pain syndrome   . Diabetes mellitus type II, controlled ([place])   . Diabetes type 2, controlled ([place])   . Diabetic peripheral neuropathy ([place])   . Encounter for long-term (current) use of other medications   . GERD (gastroesophageal reflux disease)   . Hypertension   . 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 Mixed hyperlipidemia   . Obesity   . Osteoporosis   . Primary osteoarthritis of both knees   . S/P carpal tunnel  release   . Sleep apnea    does not use CPAP  . Tobacco abuse   . Vertigo    Past Surgical History:  Procedure Laterality Date  . BREAST LUMPECTOMY WITH RADIOACTIVE SEED LOCALIZATION Left 11/30/2015   Procedure: BREAST LUMPECTOMY WITH RADIOACTIVE SEED LOCALIZATION;  Surgeon: Erroll Luna, MD;  Location: [place];  Service: General;  Laterality: Left;  . CARPAL TUNNEL RELEASE Right 08/09/1998  . CARPAL TUNNEL RELEASE Left 09/08/1998  . CESAREAN SECTION     x3  . CORONARY ANGIOPLASTY WITH STENT PLACEMENT  2004   Normal LV function  . LIPOMA EXCISION Right    Right foot  . Resection of Left Acoustic Neuroma  1999  . TOTAL ABDOMINAL HYSTERECTOMY W/ BILATERAL SALPINGOOPHORECTOMY  04/12/1988   For fibroid tumors; [place], [place]   Family History  Problem Relation Age of Onset  . Leukemia Mother        CLL  . Diabetes type II Mother   . Other Mother        Hypercholesterolemia  . Osteoarthritis Mother   . COPD Mother   . Hypertension Mother   . Hodgkin's lymphoma Father   . Diabetes type II Sister   . Osteoporosis Sister   . Paranoid behavior Brother        Unsure if diagnosed with Schizophrenia  . Diabetes type II Sister   . Hypertension Sister   . Diabetes type II Sister   . Hypertension Sister   . Schizophrenia Sister   . Heart disease Sister        Enlarged heart  . Glaucoma Sister   . Alcohol abuse Sister   . Glaucoma Sister   . Alcohol abuse Sister   . Bipolar disorder Daughter   . Lupus Daughter        SLE  . Healthy Daughter    Social History   Occupational History  . 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 Smoking status: Former Smoker    Packs/day: 0.25    Years: 50.00    Types: Cigarettes  . Smokeless tobacco: Never Used     Comment: quit 07/2016  . Alcohol use No  . Drug use: No  . Sexual activity: Not on file    Tobacco Counseling Counseling given: Not  Answered   Activities of Daily Living In your present state of health, do you have any difficulty performing the following activities: 07/28/2016  Hearing? N  Vision? N  Difficulty concentrating or making decisions? N  Walking or climbing stairs? Y  Dressing or bathing? N  Doing errands, shopping? N  Some recent data might be hidden    Immunizations and Health Maintenance Immunization History  Administered Date(s) Administered  . Influenza Whole 10/15/2007  .  Influenza, High Dose Seasonal PF 07/28/2016  . Influenza,inj,Quad PF,36+ Mos 09/25/2014  . Pneumococcal Conjugate-13 02/09/2016  . Pneumococcal Polysaccharide-23 08/09/2006, 01/19/2010  . Td 08/09/2006  . Tdap 01/22/2015   Health Maintenance Due  Topic Date Due  . PNA vac Low Risk Adult (2 of 2 - PPSV23) 02/08/2017    Patient Care Team: Binnie Rail, MD as PCP - General (Internal Medicine)  Indicate any recent Medical Services you may have received from other than [facility] providers in the past year (date may be approximate).     Assessment:   This is a routine wellness examination for [place]. Physical assessment deferred to PCP.   Hearing/Vision screen No exam data present  Dietary issues and exercise activities discussed:   Diet (meal preparation, eat out, water intake, caffeinated beverages, dairy products, fruits and vegetables): in general, a "healthy" diet  , well balanced, diabetic, low fat/ cholesterol, low salt      Reviewed heart healthy diabetic diets, encouraged patient to increase daily water intake.  Goals    None     Depression Screen PHQ 2/9 Scores 07/28/2016 05/06/2015 09/25/2014  PHQ - 2 Score 0 0 0  PHQ- 9 Score - 8 -    Fall Risk Fall Risk  07/28/2016 06/23/2016 02/24/2016 11/26/2015 11/05/2015  Falls in the past year? Yes No Yes No No  Number falls in past yr: 2 or more - 2 or more - -  Injury with Fall? No - Yes - -  Risk Factor Category  - - - - -  Risk for fall due to : Impaired  balance/gait;History of fall(s) - - - -    Cognitive Function:       Ad8 score reviewed for issues:  Issues making decisions:no  Less interest in hobbies / activities:no  Repeats questions, stories (family complaining):no  Trouble using ordinary gadgets (microwave, computer, phone):no  Forgets the month or year:no   Mismanaging finances: no  Remembering appts:no  Daily problems with thinking and/or memory: no Ad8 score is=0  Screening Tests Health Maintenance  Topic Date Due  . PNA vac Low Risk Adult (2 of 2 - PPSV23) 02/08/2017  . INFLUENZA VACCINE  05/09/2017  . OPHTHALMOLOGY EXAM  07/11/2017  . HEMOGLOBIN A1C  08/10/2017  . FOOT EXAM  08/11/2017  . MAMMOGRAM  08/19/2017  . DEXA SCAN  03/15/2018  . TETANUS/TDAP  01/21/2025  . COLONOSCOPY  08/21/2026  . Hepatitis C Screening  Completed      Plan:    Continue to eat heart healthy diet (full of fruits, vegetables, whole grains, lean protein, water--limit salt, fat, and sugar intake) and increase physical activity as tolerated.  Continue doing brain stimulating activities (puzzles, reading, adult coloring books, staying active) to keep memory sharp.   I have personally reviewed and noted the following in the patient's chart:   . Medical and social history . Use of alcohol, tobacco or illicit drugs  . Current medications and supplements . Functional ability and status . Nutritional status . Physical activity . Advanced directives . List of other physicians . Vitals . Screenings to include cognitive, depression, and falls . Referrals and appointments  In addition, I have reviewed and discussed with patient certain preventive protocols, quality metrics, and best practice recommendations. A written personalized care plan for preventive services as well as general preventive health recommendations were provided to patient.     Michiel Cowboy, RN   03/21/2017    Medical screening  examination/treatment/procedure(s) were performed  by non-physician practitioner and as supervising physician I was immediately available for consultation/collaboration. I agree with above. Binnie Rail, MD

## 2017-03-21 NOTE — Progress Notes (Signed)
Pre visit review using our clinic review tool, if applicable. No additional management support is needed unless otherwise documented below in the visit note. 

## 2017-03-21 NOTE — Patient Instructions (Signed)
Continue to eat heart healthy diet (full of fruits, vegetables, whole grains, lean protein, water--limit salt, fat, and sugar intake) and increase physical activity as tolerated.  Continue doing brain stimulating activities (puzzles, reading, adult coloring books, staying active) to keep memory sharp.   Diphenhydramine capsules or tablets [Insomnia] What is this medicine? DIPHENHYDRAMINE (dye fen HYE dra meen) is an antihistamine. This medicine is used to treat occasional sleeplesness. This medicine may be used for other purposes; ask your health care provider or pharmacist if you have questions. COMMON BRAND NAME(S): Aid to Sleep, Compoz Nighttime Sleep Aid, Nighttime Sleep Aid, Nytol, Simply Sleep, Sleep Tabs, Sominex, Unisom, Vicks Qlearquil Nighttime Allergy Relief, Vicks ZzzQuil Nightime Sleep-Aid What should I tell my health care provider before I take this medicine? They need to know if you have any of these conditions: -glaucoma -high blood pressure or heart disease -liver disease -lung or breathing disease, like asthma -pain or trouble passing urine -prostate trouble -ulcers or other stomach problems -an unusual or allergic reaction to diphenhydramine, other medicines foods, dyes, or preservatives such as sulfites -pregnant or trying to get pregnant -breast-feeding How should I use this medicine? Take this medicine by mouth with a full glass of water. Follow the directions on the prescription label. Do not take your medicine more often than directed. Talk to your pediatrician regarding the use of this medicine in children. While this drug may be prescribed for children as young as 33 years old for selected conditions, precautions do apply. Patients over 65 years old may have a stronger reaction and need a smaller dose. Overdosage: If you think you have taken too much of this medicine contact a poison control center or emergency room at once. NOTE: This medicine is only for you. Do  not share this medicine with others. What if I miss a dose? If you miss a dose, take it as soon as you can. If it is almost time for your next dose, take only that dose. Do not take double or extra doses. What may interact with this medicine? Do not take this medicine with any of the following medications: -MAOIs like Carbex, Eldepryl, Marplan, Nardil, and Parnate This medicine may also interact with the following medications: -alcohol -barbiturates like phenobarbital -medicines for bladder spasm like oxybutynin, tolterodine -medicines for blood pressure -medicines for depression, anxiety, or psychotic disturbances -medicines for movement abnormalities or Parkinson's disease -medicines for sleep -other medicines for cold, cough, or allergy -some medicines for the stomach like chlordiazepoxide, dicyclomine This list may not describe all possible interactions. Give your health care provider a list of all the medicines, herbs, non-prescription drugs, or dietary supplements you use. Also tell them if you smoke, drink alcohol, or use illegal drugs. Some items may interact with your medicine. What should I watch for while using this medicine? Visit your doctor or health care professional for regular check ups. Tell your doctor or health care professional if your symptoms do not start to get better or if they get worse. Your mouth may get dry. Chewing sugarless gum or sucking hard candy, and drinking plenty of water may help. Contact your doctor if the problem does not go away or is severe. This medicine may cause dry eyes and blurred vision. If you wear contact lenses you may feel some discomfort. Lubricating drops may help. See your eye doctor if the problem does not go away or is severe. You may get drowsy or dizzy. Do not drive, use machinery, or do anything that  needs mental alertness until you know how this medicine affects you. Do not stand or sit up quickly, especially if you are an older  patient. This reduces the risk of dizzy or fainting spells. Alcohol may interfere with the effect of this medicine. Avoid alcoholic drinks. What side effects may I notice from receiving this medicine? Side effects that you should report to your doctor or health care professional as soon as possible: -allergic reactions like skin rash, itching or hives, swelling of the face, lips, or tongue -changes in vision -confused, agitated, or nervous -fast, irregular heartbeat -tremor -trouble passing urine or change in the amount of urine -unusual bleeding or bruising -unusually weak or tired Side effects that usually do not require medical attention (report to your doctor or health care professional if they continue or are bothersome): -constipation, diarrhea -drowsy -headache -loss of appetite -stomach upset, vomiting -thick mucus This list may not describe all possible side effects. Call your doctor for medical advice about side effects. You may report side effects to FDA at 1-800-FDA-1088. Where should I keep my medicine? Keep out of the reach of children. Store at room temperature between 20 and 25 degrees C (68 and 77 degrees F). Keep container closed tightly. Throw away any unused medicine after the expiration date. NOTE: This sheet is a summary. It may not cover all possible information. If you have questions about this medicine, talk to your doctor, pharmacist, or health care provider.  2018 Elsevier/Gold Standard (2008-01-24 16:14:00)

## 2017-04-18 ENCOUNTER — Ambulatory Visit (INDEPENDENT_AMBULATORY_CARE_PROVIDER_SITE_OTHER): Payer: Medicare Other | Admitting: Podiatry

## 2017-04-18 NOTE — Progress Notes (Signed)
Erroneous encounter no show  

## 2017-05-27 NOTE — Progress Notes (Signed)
Subjective:    Patient ID: Theresa Cline, female    DOB: [birth year], 66 y.o.   MRN: [id number]  HPI The patient is here for follow up.  Diabetes with neuropathy: She is taking her medication daily as prescribed. She is compliant with a diabetic diet. She is exercising regularly - 3 times a week. She checks her feet daily and denies foot lesions. She is up-to-date with an ophthalmology examination.   Hypertension: She is taking her medication daily. She is compliant with a low sodium diet.  She denies chest pain, palpitations, edema, shortness of breath and regular headaches. She is exercising regularly.  She does monitor her blood pressure at home periodically and will do it regularly now.    Hyperlipidemia: She is taking her medication daily. She is compliant with a low fat/cholesterol diet. She is exercising regularly. She denies myalgias.   GERD:  She is taking her medication daily as prescribed.  She denies any GERD symptoms and feels her GERD is well controlled.    Vaginal discharge:  recently she has had a vaginal discharge that is brownish in color. She has itching, but denies swelling.  There is no foul odor.  She denies abdominal pain.  Medications and allergies reviewed with patient and updated if appropriate.  Patient Active Problem List   Diagnosis Date Noted  . History of renal cell carcinoma 02/05/2017  . Dyspnea on exertion 07/29/2016  . Lightheadedness 07/29/2016  . Symptomatic anemia 07/28/2016  . DM type 2, controlled, with complication ([place]) 10/93/2355  . Osteopenia 03/15/2016  . Facet arthropathy, lumbosacral ([place]) 02/24/2016  . GERD (gastroesophageal reflux disease) 02/09/2016  . Acoustic neuroma ([place]) 02/09/2016  . Right knee DJD 01/18/2012  . Trochanteric bursitis of both hips 12/11/2011  . Spinal stenosis, lumbar region, without neurogenic claudication 12/11/2011  . Syncope 07/17/2011  . EDEMA 07/12/2010  . Dizziness and giddiness 11/06/2008  .  Diabetic neuropathy associated with type 2 diabetes mellitus ([place]) 10/15/2007  . Mixed hyperlipidemia 10/15/2007  . Chronic pain syndrome 10/15/2007  . Essential hypertension 10/15/2007  . Coronary atherosclerosis 10/15/2007  . Osteoarthrosis, unspecified whether generalized or localized, involving lower leg 10/15/2007    Current Outpatient Prescriptions on File Prior to Visit  Medication Sig Dispense Refill  . ACCU-CHEK FASTCLIX LANCETS MISC USE TO TEST BLOOD SUGAR THREE TIMES A DAY AND EVERY NIGHT AT BEDTIME 102 each 2  . ACCU-CHEK SMARTVIEW test strip USE TO TEST BLOOD SUGAR THREE TIMES A DAY AND EVERY NIGHT AT BEDTIME 100 each 2  . aspirin EC 81 MG tablet Take 81 mg by mouth at bedtime.     Marland Kitchen atorvastatin (LIPITOR) 40 MG tablet Take 1 tablet (40 mg total) by mouth daily. 90 tablet 1  . BIOTIN PO Take 1 tablet by mouth daily with breakfast.    . Cholecalciferol (VITAMIN D) 2000 UNITS CAPS Take 2,000 Units by mouth daily with breakfast.     . DULoxetine (CYMBALTA) 30 MG capsule Take 1 capsule (30 mg total) by mouth daily. 90 capsule 1  . Fe Bisgly-Succ-C-Thre-B12-FA (IRON-150 PO) Take 1 tablet by mouth daily.    Marland Kitchen lidocaine (XYLOCAINE) 5 % ointment APPLY TOPICALLY THREE TIMES A DAY AS NEEDED (Patient taking differently: APPLY TOPICALLY THREE TIMES A DAY AS NEEDED for nerve pain) 100 g 1  . metFORMIN (GLUCOPHAGE) 500 MG tablet TAKE 1 TABLET BY MOUTH TWICE DAILY WITH A MEAL 180 tablet 2  . metoprolol succinate (TOPROL-XL) 100 MG 24 hr tablet Take  1 tablet (100 mg total) by mouth daily. Take with or immediately following a meal. 90 tablet 1  . Multiple Vitamin (MULTI-VITAMIN DAILY PO) Take 1 tablet by mouth daily.    . nitroGLYCERIN (NITROLINGUAL) 0.4 MG/SPRAY spray Place 1 spray under the tongue every 5 (five) minutes x 3 doses as needed for chest pain. 12 g 1  . omeprazole (PRILOSEC) 20 MG capsule TAKE 1 CAPSULE BY MOUTH EVERY DAY 90 capsule 1  . traMADol (ULTRAM) 50 MG tablet Take 2 tablets  (100 mg total) by mouth 2 (two) times daily. 120 tablet 5   No current facility-administered medications on file prior to visit.     Past Medical History:  Diagnosis Date  . Anemia, iron deficiency: Severe 07/29/2016  . Anxiety   . Bronchitis, acute   . CAD (coronary artery disease)   . Cancer [facility])    Renal s/p nephrectomy  . CARPAL TUNNEL RELEASE, BILATERAL, HX OF 08/09/1998   Qualifier: Diagnosis of  By: Amil Amen MD, Benjamine Mola    . Chronic pain syndrome   . Diabetes mellitus type II, controlled ([place])   . Diabetes type 2, controlled ([place])   . Diabetic peripheral neuropathy ([place])   . Encounter for long-term (current) use of other medications   . GERD (gastroesophageal reflux disease)   . Hypertension   . 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 Mixed hyperlipidemia   . Obesity   . Osteoporosis   . Primary osteoarthritis of both knees   . S/P carpal tunnel release   . Sleep apnea    does not use CPAP  . Tobacco abuse   . Vertigo     Past Surgical History:  Procedure Laterality Date  . BREAST LUMPECTOMY WITH RADIOACTIVE SEED LOCALIZATION Left 11/30/2015   Procedure: BREAST LUMPECTOMY WITH RADIOACTIVE SEED LOCALIZATION;  Surgeon: Erroll Luna, MD;  Location: [place];  Service: General;  Laterality: Left;  . CARPAL TUNNEL RELEASE Right 08/09/1998  . CARPAL TUNNEL RELEASE Left 09/08/1998  . CESAREAN SECTION     x3  . CORONARY ANGIOPLASTY WITH STENT PLACEMENT  2004   Normal LV function  . LIPOMA EXCISION Right    Right foot  . Resection of Left Acoustic Neuroma  1999  . TOTAL ABDOMINAL HYSTERECTOMY W/ BILATERAL SALPINGOOPHORECTOMY  04/12/1988   For fibroid tumors; [place], [place]    Social History   Social History  . Marital status: Divorced    Spouse name: N/A  . Number of children: N/A  . Years of education: N/A   Occupational History  . 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 Smoking  status: Former Smoker    Packs/day: 0.25    Years: 50.00    Types: Cigarettes  . Smokeless tobacco: Never Used     Comment: quit 07/2016  . Alcohol use No  . Drug use: No  . Sexual activity: No   Other Topics Concern  . None   Social History Narrative   Divorced   Disability mainly for back and chronic intermittent vertigo - since tumor removed - no surgical correction for back   Lives alone, keeps her grandson, 28 yr old daughter is a mother    Family History  Problem Relation Age of Onset  . Leukemia Mother        CLL  . Diabetes type II Mother   . Other Mother  Hypercholesterolemia  . Osteoarthritis Mother   . COPD Mother   . Hypertension Mother   . Hodgkin's lymphoma Father   . Diabetes type II Sister   . Osteoporosis Sister   . Paranoid behavior Brother        Unsure if diagnosed with Schizophrenia  . Diabetes type II Sister   . Hypertension Sister   . Diabetes type II Sister   . Hypertension Sister   . Schizophrenia Sister   . Heart disease Sister        Enlarged heart  . Glaucoma Sister   . Alcohol abuse Sister   . Glaucoma Sister   . Alcohol abuse Sister   . Bipolar disorder Daughter   . Lupus Daughter        SLE  . Healthy Daughter     Review of Systems  Constitutional: Negative for chills and fever.  HENT: Positive for postnasal drip and rhinorrhea.   Respiratory: Positive for cough (PND) and wheezing. Negative for shortness of breath.   Cardiovascular: Negative for chest pain, palpitations and leg swelling.  Gastrointestinal: Negative for abdominal pain and nausea.  Genitourinary: Positive for vaginal discharge (brownish). Negative for dysuria, frequency, hematuria, vaginal bleeding and vaginal pain.       Vulva itch, no vulva swelling  Neurological: Positive for headaches. Negative for light-headedness.       Objective:   Vitals:   05/28/17 1534  BP: 136/74  Pulse: 61  Resp: 16  Temp: 98 F (36.7 C)  SpO2: 94%   Wt Readings  from Last 3 Encounters:  05/28/17 141 lb (64 kg)  03/21/17 148 lb (67.1 kg)  02/07/17 145 lb (65.8 kg)   Body mass index is 24.98 kg/m.   Physical Exam    Constitutional: Appears well-developed and well-nourished. No distress.  HENT:  Head: Normocephalic and atraumatic.  Neck: Neck supple. No tracheal deviation present. No thyromegaly present.  No cervical lymphadenopathy Cardiovascular: Normal rate, regular rhythm and normal heart sounds.   No murmur heard. No carotid bruit .  No edema Pulmonary/Chest: Effort normal and breath sounds normal. No respiratory distress. No has no wheezes. No rales.  Skin: Skin is warm and dry. Not diaphoretic.  Psychiatric: Normal mood and affect. Behavior is normal.      Assessment & Plan:    See Problem List for Assessment and Plan of chronic medical problems.

## 2017-05-28 ENCOUNTER — Encounter: Payer: Self-pay | Admitting: Internal Medicine

## 2017-05-28 ENCOUNTER — Ambulatory Visit (INDEPENDENT_AMBULATORY_CARE_PROVIDER_SITE_OTHER): Payer: Medicare Other | Admitting: Internal Medicine

## 2017-05-28 VITALS — BP 136/74 | HR 61 | Temp 98.0°F | Resp 16 | Wt 141.0 lb

## 2017-05-28 DIAGNOSIS — I10 Essential (primary) hypertension: Secondary | ICD-10-CM

## 2017-05-28 DIAGNOSIS — E118 Type 2 diabetes mellitus with unspecified complications: Secondary | ICD-10-CM | POA: Diagnosis not present

## 2017-05-28 DIAGNOSIS — N898 Other specified noninflammatory disorders of vagina: Secondary | ICD-10-CM | POA: Insufficient documentation

## 2017-05-28 DIAGNOSIS — K219 Gastro-esophageal reflux disease without esophagitis: Secondary | ICD-10-CM | POA: Diagnosis not present

## 2017-05-28 DIAGNOSIS — E782 Mixed hyperlipidemia: Secondary | ICD-10-CM | POA: Diagnosis not present

## 2017-05-28 MED ORDER — HYDRALAZINE HCL 25 MG PO TABS: ORAL_TABLET | ORAL | 5 refills | Status: DC

## 2017-05-28 MED ORDER — LOSARTAN POTASSIUM-HCTZ 100-25 MG PO TABS: 1.0000 | ORAL_TABLET | Freq: Every day | ORAL | 3 refills | Status: DC

## 2017-05-28 MED ORDER — FLUCONAZOLE 150 MG PO TABS: 150.0000 mg | ORAL_TABLET | Freq: Once | ORAL | 0 refills | Status: AC

## 2017-05-28 NOTE — Assessment & Plan Note (Signed)
Lab Results  Component Value Date   HGBA1C 6.7 (H) 02/07/2017    Well controlled Continue regular exercise Continue diabetic diet Continue metformin

## 2017-05-28 NOTE — Assessment & Plan Note (Signed)
Cholesterol has been well controlled Continue statin 

## 2017-05-28 NOTE — Assessment & Plan Note (Signed)
BP Readings from Last 3 Encounters:  05/28/17 136/74  03/21/17 119/72  02/07/17 128/74    BP well controlled Current regimen effective and well tolerated Continue current medications at current doses cmp

## 2017-05-28 NOTE — Patient Instructions (Addendum)
  Test(s) ordered today. Your results will be released to [place] (or called to you) after review, usually within 72hours after test completion. If any changes need to be made, you will be notified at that same time.   Medications reviewed and updated.  Changes include stopping the valsartan-hctz and starting losartan-hctz.  Diflucan was given for a possible yeast infection - if you continue to have discharge please schedule an appointment with our nurse practitioner for a pelvic exam.   Your prescription(s) have been submitted to your pharmacy. Please take as directed and contact our office if you believe you are having problem(s) with the medication(s).   Please followup in 6 months

## 2017-05-28 NOTE — Assessment & Plan Note (Signed)
GERD controlled Continue daily medication  

## 2017-05-28 NOTE — Assessment & Plan Note (Addendum)
Brownish in color, positive itch Will treat empirically for a yeast infection w/ diflucan If no improvement she will need to return for a pelvic exam - she will need to see [facility]

## 2017-05-30 ENCOUNTER — Telehealth: Payer: Self-pay | Admitting: Internal Medicine

## 2017-05-30 DIAGNOSIS — M1712 Unilateral primary osteoarthritis, left knee: Secondary | ICD-10-CM | POA: Diagnosis not present

## 2017-05-30 MED ORDER — FLUCONAZOLE 150 MG PO TABS: 150.0000 mg | ORAL_TABLET | Freq: Every day | ORAL | 0 refills | Status: DC

## 2017-05-30 MED ORDER — METOPROLOL SUCCINATE ER 100 MG PO TB24: 100.0000 mg | ORAL_TABLET | Freq: Every day | ORAL | 1 refills | Status: DC

## 2017-05-30 NOTE — Telephone Encounter (Signed)
Verified chart MD miss and sent rx [facility] of [facility]. Resent [facility].../lmb

## 2017-05-30 NOTE — Telephone Encounter (Signed)
States Dr. Quay Burow was to have sent a script of [facility] at [facility].  Pharmacy has not received.  Patient is also requesting script for metoprolol to be sent as well.

## 2017-06-06 DIAGNOSIS — M1712 Unilateral primary osteoarthritis, left knee: Secondary | ICD-10-CM | POA: Diagnosis not present

## 2017-06-13 ENCOUNTER — Other Ambulatory Visit: Payer: Self-pay | Admitting: Physical Medicine & Rehabilitation

## 2017-06-13 DIAGNOSIS — M1712 Unilateral primary osteoarthritis, left knee: Secondary | ICD-10-CM | POA: Diagnosis not present

## 2017-06-21 ENCOUNTER — Encounter (HOSPITAL_COMMUNITY): Payer: Self-pay | Admitting: Emergency Medicine

## 2017-06-21 ENCOUNTER — Emergency Department (HOSPITAL_COMMUNITY): Payer: Medicare Other

## 2017-06-21 ENCOUNTER — Emergency Department (HOSPITAL_COMMUNITY)
Admission: EM | Admit: 2017-06-21 | Discharge: 2017-06-21 | Disposition: A | Discharge status: Home / Self Care | Payer: Medicare Other | Attending: Emergency Medicine | Admitting: Emergency Medicine

## 2017-06-21 DIAGNOSIS — Z79899 Other long term (current) drug therapy: Secondary | ICD-10-CM | POA: Insufficient documentation

## 2017-06-21 DIAGNOSIS — I10 Essential (primary) hypertension: Secondary | ICD-10-CM | POA: Diagnosis not present

## 2017-06-21 DIAGNOSIS — R109 Unspecified abdominal pain: Secondary | ICD-10-CM | POA: Diagnosis not present

## 2017-06-21 DIAGNOSIS — Z955 Presence of coronary angioplasty implant and graft: Secondary | ICD-10-CM | POA: Diagnosis not present

## 2017-06-21 DIAGNOSIS — Z87891 Personal history of nicotine dependence: Secondary | ICD-10-CM | POA: Insufficient documentation

## 2017-06-21 DIAGNOSIS — N12 Tubulo-interstitial nephritis, not specified as acute or chronic: Secondary | ICD-10-CM | POA: Diagnosis not present

## 2017-06-21 DIAGNOSIS — E119 Type 2 diabetes mellitus without complications: Secondary | ICD-10-CM | POA: Insufficient documentation

## 2017-06-21 DIAGNOSIS — Z7984 Long term (current) use of oral hypoglycemic drugs: Secondary | ICD-10-CM | POA: Insufficient documentation

## 2017-06-21 DIAGNOSIS — R1032 Left lower quadrant pain: Secondary | ICD-10-CM | POA: Diagnosis present

## 2017-06-21 DIAGNOSIS — I2510 Atherosclerotic heart disease of native coronary artery without angina pectoris: Secondary | ICD-10-CM | POA: Insufficient documentation

## 2017-06-21 DIAGNOSIS — Z7982 Long term (current) use of aspirin: Secondary | ICD-10-CM | POA: Insufficient documentation

## 2017-06-21 LAB — URINALYSIS, ROUTINE W REFLEX MICROSCOPIC
Bilirubin Urine: NEGATIVE
GLUCOSE, UA: NEGATIVE mg/dL
Ketones, ur: 20 mg/dL — AB
NITRITE: NEGATIVE
PH: 5 (ref 5.0–8.0)
PROTEIN: NEGATIVE mg/dL
Specific Gravity, Urine: 1.011 (ref 1.005–1.030)

## 2017-06-21 LAB — COMPREHENSIVE METABOLIC PANEL
ALT: 19 U/L (ref 14–54)
AST: 36 U/L (ref 15–41)
Albumin: 4.4 g/dL (ref 3.5–5.0)
Alkaline Phosphatase: 68 U/L (ref 38–126)
Anion gap: 12 (ref 5–15)
BILIRUBIN TOTAL: 0.8 mg/dL (ref 0.3–1.2)
BUN: 15 mg/dL (ref 6–20)
CALCIUM: 10.2 mg/dL (ref 8.9–10.3)
CHLORIDE: 95 mmol/L — AB (ref 101–111)
CO2: 29 mmol/L (ref 22–32)
CREATININE: 0.99 mg/dL (ref 0.44–1.00)
GFR, EST NON AFRICAN AMERICAN: 57 mL/min — AB (ref >60)
Glucose, Bld: 99 mg/dL (ref 65–99)
Potassium: 3.5 mmol/L (ref 3.5–5.1)
Sodium: 136 mmol/L (ref 135–145)
TOTAL PROTEIN: 8.1 g/dL (ref 6.5–8.1)

## 2017-06-21 LAB — CBC WITH DIFFERENTIAL/PLATELET
BASOS ABS: 0 10*3/uL (ref 0.0–0.1)
Basophils Relative: 0 %
EOS ABS: 0 10*3/uL (ref 0.0–0.7)
EOS PCT: 0 %
HCT: 41.2 % (ref 36.0–46.0)
HEMOGLOBIN: 13.7 g/dL (ref 12.0–15.0)
Lymphocytes Relative: 9 %
Lymphs Abs: 1.3 10*3/uL (ref 0.7–4.0)
MCH: 29.1 pg (ref 26.0–34.0)
MCHC: 33.3 g/dL (ref 30.0–36.0)
MCV: 87.7 fL (ref 78.0–100.0)
MONO ABS: 0.6 10*3/uL (ref 0.1–1.0)
Monocytes Relative: 4 %
NEUTROS PCT: 87 %
Neutro Abs: 12.6 10*3/uL — ABNORMAL HIGH (ref 1.7–7.7)
PLATELETS: 219 10*3/uL (ref 150–400)
RBC: 4.7 MIL/uL (ref 3.87–5.11)
RDW: 13.4 % (ref 11.5–15.5)
WBC: 14.5 10*3/uL — AB (ref 4.0–10.5)

## 2017-06-21 LAB — LIPASE, BLOOD: LIPASE: 17 U/L (ref 11–51)

## 2017-06-21 MED ORDER — MORPHINE SULFATE (PF) 4 MG/ML IV SOLN
6.0000 mg | Freq: Once | INTRAVENOUS | Status: AC
  Administered 2017-06-21: 6 mg via INTRAVENOUS
  Filled 2017-06-21: qty 2

## 2017-06-21 MED ORDER — CEPHALEXIN 500 MG PO CAPS: 500.0000 mg | ORAL_CAPSULE | Freq: Three times a day (TID) | ORAL | 0 refills | Status: AC

## 2017-06-21 MED ORDER — CEPHALEXIN 500 MG PO CAPS
500.0000 mg | ORAL_CAPSULE | Freq: Once | ORAL | Status: AC
Start: 2017-06-21 — End: 2017-06-21
  Administered 2017-06-21: 500 mg via ORAL
  Filled 2017-06-21: qty 1

## 2017-06-21 MED ORDER — SODIUM CHLORIDE 0.9 % IV BOLUS (SEPSIS)
500.0000 mL | Freq: Once | INTRAVENOUS | Status: AC
  Administered 2017-06-21: 500 mL via INTRAVENOUS

## 2017-06-21 NOTE — ED Notes (Signed)
Bed: WA06 Expected date:  Expected time:  Means of arrival:  Comments: held

## 2017-06-21 NOTE — ED Provider Notes (Signed)
[facility] DEPT Provider Note   CSN: [id number] Arrival date & time: 06/21/17  1055     History   Chief Complaint Chief Complaint  Patient presents with  . Flank Pain    HPI Theresa Cline is a 66 y.o. female.  HPI  66 year old female with a history of a right-sided nephrectomy for renal cancer presents with left lower abdominal pain and left flank pain. She states this started last night at around 7:30 PM. Started in her left lower abdomen and now it is starting to wrap around into her left back since this morning. The pain is constant and aching. She rates it as a 9/10. She's tried her tramadol which she chronically takes but has not helped. No fevers, nausea, vomiting, diarrhea, dysuria, frequency, or hematuria. Has never had a kidney stone before. There is no chest pain or shortness of breath or cough.  Past Medical History:  Diagnosis Date  . Anemia, iron deficiency: Severe 07/29/2016  . Anxiety   . Bronchitis, acute   . CAD (coronary artery disease)   . Cancer [facility])    Renal s/p nephrectomy  . CARPAL TUNNEL RELEASE, BILATERAL, HX OF 08/09/1998   Qualifier: Diagnosis of  By: Amil Amen MD, Benjamine Mola    . Chronic pain syndrome   . Diabetes mellitus type II, controlled ([place])   . Diabetes type 2, controlled ([place])   . Diabetic peripheral neuropathy ([place])   . Encounter for long-term (current) use of other medications   . GERD (gastroesophageal reflux disease)   . Hypertension   . 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 Mixed hyperlipidemia   . Obesity   . Osteoporosis   . Primary osteoarthritis of both knees   . S/P carpal tunnel release   . Sleep apnea    does not use CPAP  . Tobacco abuse   . Vertigo     Patient Active Problem List   Diagnosis Date Noted  . Vaginal discharge 05/28/2017  . History of renal cell carcinoma 02/05/2017  . Dyspnea on exertion 07/29/2016  . Lightheadedness 07/29/2016  . Symptomatic anemia 07/28/2016  . DM type 2, controlled,  with complication ([place]) 27/25/3664  . Osteopenia 03/15/2016  . Facet arthropathy, lumbosacral ([place]) 02/24/2016  . GERD (gastroesophageal reflux disease) 02/09/2016  . Acoustic neuroma ([place]) 02/09/2016  . Right knee DJD 01/18/2012  . Trochanteric bursitis of both hips 12/11/2011  . Spinal stenosis, lumbar region, without neurogenic claudication 12/11/2011  . Syncope 07/17/2011  . EDEMA 07/12/2010  . Dizziness and giddiness 11/06/2008  . Diabetic neuropathy associated with type 2 diabetes mellitus ([place]) 10/15/2007  . Mixed hyperlipidemia 10/15/2007  . Chronic pain syndrome 10/15/2007  . Essential hypertension 10/15/2007  . Coronary atherosclerosis 10/15/2007  . Osteoarthrosis, unspecified whether generalized or localized, involving lower leg 10/15/2007    Past Surgical History:  Procedure Laterality Date  . BREAST LUMPECTOMY WITH RADIOACTIVE SEED LOCALIZATION Left 11/30/2015   Procedure: BREAST LUMPECTOMY WITH RADIOACTIVE SEED LOCALIZATION;  Surgeon: Erroll Luna, MD;  Location: [facility];  Service: General;  Laterality: Left;  . CARPAL TUNNEL RELEASE Right 08/09/1998  . CARPAL TUNNEL RELEASE Left 09/08/1998  . CESAREAN SECTION     x3  . CORONARY ANGIOPLASTY WITH STENT PLACEMENT  2004   Normal LV function  . LIPOMA EXCISION Right    Right foot  . Resection of Left Acoustic Neuroma  1999  . TOTAL ABDOMINAL HYSTERECTOMY W/ BILATERAL SALPINGOOPHORECTOMY  04/12/1988   For fibroid tumors;  [place], [place]    OB History    No data available       Home Medications    Prior to Admission medications   Medication Sig Start Date End Date Taking? Authorizing Provider  aspirin EC 81 MG tablet Take 81 mg by mouth at bedtime.    Yes [provider]  atorvastatin (LIPITOR) 40 MG tablet Take 1 tablet (40 mg total) by mouth daily. 03/08/17  Yes Burns, Claudina Lick, MD  BIOTIN PO Take 1 tablet by mouth daily with breakfast.   Yes [provider]  Cholecalciferol  (VITAMIN D) 2000 UNITS CAPS Take 2,000 Units by mouth daily with breakfast.    Yes [provider]  DULoxetine (CYMBALTA) 30 MG capsule Take 1 capsule (30 mg total) by mouth daily. 02/19/17  Yes Burns, Claudina Lick, MD  Fe Bisgly-Succ-C-Thre-B12-FA (IRON-150 PO) Take 1 tablet by mouth daily.   Yes [provider]  hydrALAZINE (APRESOLINE) 25 MG tablet TAKE 3 TABLETS BY MOUTH THREE TIMES A DAY 05/28/17  Yes Burns, Claudina Lick, MD  losartan-hydrochlorothiazide (HYZAAR) 100-25 MG tablet Take 1 tablet by mouth daily. 05/28/17  Yes Burns, Claudina Lick, MD  metFORMIN (GLUCOPHAGE) 500 MG tablet TAKE 1 TABLET BY MOUTH TWICE DAILY WITH A MEAL 01/11/17  Yes Burns, Claudina Lick, MD  metoprolol succinate (TOPROL-XL) 100 MG 24 hr tablet Take 1 tablet (100 mg total) by mouth daily. Take with or immediately following a meal. 05/30/17  Yes Burns, Claudina Lick, MD  Multiple Vitamin (MULTI-VITAMIN DAILY PO) Take 1 tablet by mouth daily.   Yes [provider]  omeprazole (PRILOSEC) 20 MG capsule TAKE 1 CAPSULE BY MOUTH EVERY DAY 02/19/17  Yes Burns, Claudina Lick, MD  traMADol (ULTRAM) 50 MG tablet TAKE 2 TABLETS BY MOUTH TWICE DAILY 06/14/17  Yes Kirsteins, Luanna Salk, MD  ACCU-CHEK FASTCLIX LANCETS MISC USE TO TEST BLOOD SUGAR THREE TIMES A DAY AND EVERY NIGHT AT BEDTIME 10/16/16   Burns, Claudina Lick, MD  ACCU-CHEK SMARTVIEW test strip USE TO TEST BLOOD SUGAR THREE TIMES A DAY AND EVERY NIGHT AT BEDTIME 10/16/16   Binnie Rail, MD  cephALEXin (KEFLEX) 500 MG capsule Take 1 capsule (500 mg total) by mouth 3 (three) times daily. 06/21/17 07/01/17  Sherwood Gambler, MD  fluconazole (DIFLUCAN) 150 MG tablet Take 1 tablet (150 mg total) by mouth daily. Patient not taking: Reported on 06/21/2017 05/30/17   Binnie Rail, MD  lidocaine (XYLOCAINE) 5 % ointment APPLY TOPICALLY THREE TIMES A DAY AS NEEDED Patient not taking: Reported on 06/21/2017 05/08/16   Charlett Blake, MD  nitroGLYCERIN (NITROLINGUAL) 0.4 MG/SPRAY spray Place 1 spray under  the tongue every 5 (five) minutes x 3 doses as needed for chest pain. 11/22/16   Binnie Rail, MD    Family History Family History  Problem Relation Age of Onset  . Leukemia Mother        CLL  . Diabetes type II Mother   . Other Mother        Hypercholesterolemia  . Osteoarthritis Mother   . COPD Mother   . Hypertension Mother   . Hodgkin's lymphoma Father   . Diabetes type II Sister   . Osteoporosis Sister   . Paranoid behavior Brother        Unsure if diagnosed with Schizophrenia  . Diabetes type II Sister   . Hypertension Sister   . Diabetes type II Sister   . Hypertension Sister   . Schizophrenia Sister   .  Heart disease Sister        Enlarged heart  . Glaucoma Sister   . Alcohol abuse Sister   . Glaucoma Sister   . Alcohol abuse Sister   . Bipolar disorder Daughter   . Lupus Daughter        SLE  . Healthy Daughter     Social History Social History  Substance Use Topics  . Smoking status: Former Smoker    Packs/day: 0.25    Years: 50.00    Types: Cigarettes  . Smokeless tobacco: Never Used     Comment: quit 07/2016  . Alcohol use No     Allergies   Adhesive [tape]; Gabapentin; and Lyrica [pregabalin]   Review of Systems Review of Systems  Constitutional: Negative for fever.  Respiratory: Negative for cough and shortness of breath.   Cardiovascular: Negative for chest pain.  Gastrointestinal: Positive for abdominal pain. Negative for blood in stool, diarrhea, nausea and vomiting.  Genitourinary: Positive for flank pain. Negative for dysuria, frequency and hematuria.  Musculoskeletal: Positive for back pain.  All other systems reviewed and are negative.    Physical Exam Updated Vital Signs BP (!) 210/91 (BP Location: Right Arm)   Pulse 82   Temp 98.1 F (36.7 C) (Oral)   Resp 14   SpO2 100%   Physical Exam  Constitutional: She is oriented to person, place, and time. She appears well-developed and well-nourished.  Appears uncomfortable, in  pain  HENT:  Head: Normocephalic and atraumatic.  Right Ear: External ear normal.  Left Ear: External ear normal.  Nose: Nose normal.  Eyes: Right eye exhibits no discharge. Left eye exhibits no discharge.  Cardiovascular: Normal rate, regular rhythm and normal heart sounds.   Pulmonary/Chest: Effort normal and breath sounds normal.  Abdominal: Soft. There is tenderness in the left lower quadrant. There is CVA tenderness (left sided).  Neurological: She is alert and oriented to person, place, and time.  Skin: Skin is warm and dry. She is not diaphoretic.  Nursing note and vitals reviewed.    ED Treatments / Results  Labs (all labs ordered are listed, but only abnormal results are displayed) Labs Reviewed  URINALYSIS, ROUTINE W REFLEX MICROSCOPIC - Abnormal; Notable for the following:       Result Value   APPearance HAZY (*)    Hgb urine dipstick MODERATE (*)    Ketones, ur 20 (*)    Leukocytes, UA LARGE (*)    Bacteria, UA RARE (*)    Squamous Epithelial / LPF 0-5 (*)    All other components within normal limits  COMPREHENSIVE METABOLIC PANEL - Abnormal; Notable for the following:    Chloride 95 (*)    GFR calc non Af Amer 57 (*)    All other components within normal limits  CBC WITH DIFFERENTIAL/PLATELET - Abnormal; Notable for the following:    WBC 14.5 (*)    Neutro Abs 12.6 (*)    All other components within normal limits  URINE CULTURE  LIPASE, BLOOD    EKG  EKG Interpretation None       Radiology Ct Renal Stone Study  Result Date: 06/21/2017 CLINICAL DATA:  Left flank pain radiating into left lower quadrant EXAM: CT ABDOMEN AND PELVIS WITHOUT CONTRAST TECHNIQUE: Multidetector CT imaging of the abdomen and pelvis was performed following the standard protocol without oral or intravenous contrast material administration. COMPARISON:  July 29, 2016 FINDINGS: Lower chest: There is mild scarring in the lung bases. No lung base edema  or consolidation. There is a  small hiatal hernia. Hepatobiliary: No focal liver lesions are appreciable on this noncontrast enhanced study. There is a Riedel's lobe on the right, an anatomic variant. Gallbladder wall is not appreciably thickened. There is no biliary duct dilatation. Pancreas: There is no evident pancreatic mass or inflammatory focus. Spleen: No splenic lesions are evident. Adrenals/Urinary Tract: Adrenals appear unremarkable bilaterally. Right kidney is surgically absent. There are surgical clips in the right perinephric region, stable. There is no mass in the right perinephric region. Left kidney shows no evident mass or hydronephrosis. There is a small focus of calcification in a peripheral renal artery branch. There is no renal calculus on the left. There is no ureteral calculus appreciable on the left. Urinary bladder is midline with wall thickness within normal limits. Stomach/Bowel: Rectum is mildly distended with stool and air. There is no appreciable bowel wall or mesenteric thickening. There is no evident bowel obstruction. No free air or portal venous air. Vascular/Lymphatic: There is atherosclerotic calcification in the aorta and common iliac arteries. There is also calcification in each internal iliac artery. There is dilatation of the distal abdominal aorta with a maximum transverse diameter of 2.8 x 2.7 cm. There is no periaortic fluid. Major mesenteric vessels appear patent on this noncontrast enhanced study, although there is moderate atherosclerotic calcification at the origins of the major mesenteric vessels. There is no evident adenopathy in the abdomen or pelvis. Reproductive: Uterus is absent.  There is no pelvic mass. Other: There is no periappendiceal region inflammation. There is no ascites or abscess in the abdomen or pelvis. Musculoskeletal: There is degenerative change in the lumbar spine. There is moderate spinal stenosis at L4-5 due to diffuse disc protrusion and bony hypertrophy. There are no  blastic or lytic bone lesions. No intramuscular or abdominal wall lesion is evident. IMPRESSION: 1. Status post right nephrectomy. No lesion is seen in the right perinephric region. Bowel extends into the right perinephric region. 2. No left-sided hydronephrosis. No left-sided renal or ureteral calculus evident. Urinary bladder is midline with wall thickness within normal limits. 3. No bowel obstruction. No bowel wall thickening. No abscess. No periappendiceal region inflammation. 4. Aortoiliac atherosclerosis. There is calcification in proximal major mesenteric vessels. Note that there is moderate calcification at the origin of the left renal artery. The degree of calcification in the proximal left renal artery raises question of hypertension on a renovascular basis. 5. Mild dilatation of the distal aorta with a maximum transverse diameter 2.8 x 2.7 cm. Ectatic abdominal aorta at risk for aneurysm development. Recommend followup by ultrasound in 5 years. This recommendation follows ACR consensus guidelines: White Paper of the ACR Incidental Findings Committee II on Vascular Findings. J Am Coll Radiol 2013; 10:789-794. 6.  Spinal stenosis at L4-5, multifactorial. Aortic Atherosclerosis (ICD10-I70.0). Electronically Signed   By: Lowella Grip III M.D.   On: 06/21/2017 16:56    Procedures Procedures (including critical care time)  Medications Ordered in ED Medications  sodium chloride 0.9 % bolus 500 mL (not administered)  cephALEXin (KEFLEX) capsule 500 mg (not administered)  sodium chloride 0.9 % bolus 500 mL (500 mLs Intravenous New Bag/Given 06/21/17 1653)  morphine 4 MG/ML injection 6 mg (6 mg Intravenous Given 06/21/17 1653)     Initial Impression / Assessment and Plan / ED Course  I have reviewed the triage vital signs and the nursing notes.  Pertinent labs & imaging results that were available during my care of the patient were reviewed by  me and considered in my medical decision making  (see chart for details).      Ct scan does not how any acute pathology that would explain her pain. This is likely pyelonephritis, likely one-sided because of her prior nephrectomy. However she is otherwise well appearing and has no fevers or vomiting. Pain is much better after IV morphine. I offered to help change her oral pain medication but she states that she has significant nausea and intolerance to hydrocodone and oxycodone. She prefers no change in medicines but will try Tylenol. I will give her oral Keflex. She needs to follow-up with her PCP in the next few days and discussed strict return precautions.   Final Clinical Impressions(s) / ED Diagnoses   Final diagnoses:  Pyelonephritis    New Prescriptions New Prescriptions   CEPHALEXIN (KEFLEX) 500 MG CAPSULE    Take 1 capsule (500 mg total) by mouth 3 (three) times daily.     Sherwood Gambler, MD 06/21/17 [phone]

## 2017-06-21 NOTE — ED Notes (Signed)
Pt tried to void for urine specimen but could not void more than 5cc.

## 2017-06-21 NOTE — ED Triage Notes (Signed)
Patient c/o left flank pain that radiates to LLQ since last night. Patient denies n/v/d or urinary problems.

## 2017-06-21 NOTE — ED Notes (Signed)
Pt wants to wait until she gets fluids in her before giving urine specimen because she does not feel that she can void at this time.

## 2017-06-22 ENCOUNTER — Encounter: Payer: Medicare Other | Attending: Physical Medicine & Rehabilitation

## 2017-06-22 ENCOUNTER — Ambulatory Visit: Payer: Medicare Other | Admitting: Physical Medicine & Rehabilitation

## 2017-06-22 DIAGNOSIS — Z6826 Body mass index (BMI) 26.0-26.9, adult: Secondary | ICD-10-CM | POA: Insufficient documentation

## 2017-06-22 DIAGNOSIS — Z833 Family history of diabetes mellitus: Secondary | ICD-10-CM | POA: Insufficient documentation

## 2017-06-22 DIAGNOSIS — Z87891 Personal history of nicotine dependence: Secondary | ICD-10-CM | POA: Insufficient documentation

## 2017-06-22 DIAGNOSIS — Z806 Family history of leukemia: Secondary | ICD-10-CM | POA: Insufficient documentation

## 2017-06-22 DIAGNOSIS — E669 Obesity, unspecified: Secondary | ICD-10-CM | POA: Insufficient documentation

## 2017-06-22 DIAGNOSIS — D509 Iron deficiency anemia, unspecified: Secondary | ICD-10-CM | POA: Insufficient documentation

## 2017-06-22 DIAGNOSIS — E1142 Type 2 diabetes mellitus with diabetic polyneuropathy: Secondary | ICD-10-CM | POA: Insufficient documentation

## 2017-06-22 DIAGNOSIS — Z811 Family history of alcohol abuse and dependence: Secondary | ICD-10-CM | POA: Insufficient documentation

## 2017-06-22 DIAGNOSIS — K219 Gastro-esophageal reflux disease without esophagitis: Secondary | ICD-10-CM | POA: Insufficient documentation

## 2017-06-22 DIAGNOSIS — I10 Essential (primary) hypertension: Secondary | ICD-10-CM | POA: Insufficient documentation

## 2017-06-22 DIAGNOSIS — M48061 Spinal stenosis, lumbar region without neurogenic claudication: Secondary | ICD-10-CM | POA: Insufficient documentation

## 2017-06-22 DIAGNOSIS — Z905 Acquired absence of kidney: Secondary | ICD-10-CM | POA: Insufficient documentation

## 2017-06-22 DIAGNOSIS — Z8262 Family history of osteoporosis: Secondary | ICD-10-CM | POA: Insufficient documentation

## 2017-06-22 DIAGNOSIS — Z818 Family history of other mental and behavioral disorders: Secondary | ICD-10-CM | POA: Insufficient documentation

## 2017-06-22 DIAGNOSIS — M170 Bilateral primary osteoarthritis of knee: Secondary | ICD-10-CM | POA: Insufficient documentation

## 2017-06-22 DIAGNOSIS — G4730 Sleep apnea, unspecified: Secondary | ICD-10-CM | POA: Insufficient documentation

## 2017-06-22 DIAGNOSIS — G8929 Other chronic pain: Secondary | ICD-10-CM | POA: Insufficient documentation

## 2017-06-22 DIAGNOSIS — Z85528 Personal history of other malignant neoplasm of kidney: Secondary | ICD-10-CM | POA: Insufficient documentation

## 2017-06-22 DIAGNOSIS — Z8249 Family history of ischemic heart disease and other diseases of the circulatory system: Secondary | ICD-10-CM | POA: Insufficient documentation

## 2017-06-22 DIAGNOSIS — Z825 Family history of asthma and other chronic lower respiratory diseases: Secondary | ICD-10-CM | POA: Insufficient documentation

## 2017-06-22 DIAGNOSIS — E782 Mixed hyperlipidemia: Secondary | ICD-10-CM | POA: Insufficient documentation

## 2017-06-22 DIAGNOSIS — M810 Age-related osteoporosis without current pathological fracture: Secondary | ICD-10-CM | POA: Insufficient documentation

## 2017-06-22 DIAGNOSIS — I2510 Atherosclerotic heart disease of native coronary artery without angina pectoris: Secondary | ICD-10-CM | POA: Insufficient documentation

## 2017-06-22 DIAGNOSIS — Z79899 Other long term (current) drug therapy: Secondary | ICD-10-CM | POA: Insufficient documentation

## 2017-06-22 DIAGNOSIS — Z8261 Family history of arthritis: Secondary | ICD-10-CM | POA: Insufficient documentation

## 2017-06-23 LAB — URINE CULTURE

## 2017-07-03 NOTE — Progress Notes (Signed)
Subjective:    Patient ID: Theresa Cline, female    DOB: [birth year], 66 y.o.   MRN: [id number]  HPI She is here for follow up from the hospital.  She went to the ED on 06/21/17 for left lower abdominal pain and left sided flank pain.  It started the night prior.  The pain started in the left lower abdomen and radiated up to the left back.  The pain was constant and aching.  The pain is 9/10.  The tramadol that the takes chronically has not helped.  She denies fever, nausea, dysuria, urinary frequency, hematuria and diarrhea.  She never had kidney stones.  Her BP was very elevated.  She had tenderness on exam in the [facility] and left CVA.  Her UA showed a UTI and her WBC was elevated to 14.5.  She received morphine, IVF, and keflex.  A Ct scan did not show any acute pathology.  She was diagnosed with pyelonephritis.  She was discharged on Keflex 500 mg TID x 10 days.  She completed the antibiotic. Four nights ago she had pain in her back and was unable to tell if it was from chronic pain in her back or kidney infection.  It went away.  She denies fever, chills, dysuria, urinary frequency, hematuria, abdominal pain or flank pain.  She sees urology this week.   She has chronic back pain.  She usually does not drive.  She has a handicap sticker and needs it renewed.    Medications and allergies reviewed with patient and updated if appropriate.  Patient Active Problem List   Diagnosis Date Noted  . Vaginal discharge 05/28/2017  . History of renal cell carcinoma 02/05/2017  . Dyspnea on exertion 07/29/2016  . Lightheadedness 07/29/2016  . Symptomatic anemia 07/28/2016  . DM type 2, controlled, with complication ([place]) 95/62/1308  . Osteopenia 03/15/2016  . Facet arthropathy, lumbosacral ([place]) 02/24/2016  . GERD (gastroesophageal reflux disease) 02/09/2016  . Acoustic neuroma ([place]) 02/09/2016  . Right knee DJD 01/18/2012  . Trochanteric bursitis of both hips 12/11/2011  . Spinal stenosis,  lumbar region, without neurogenic claudication 12/11/2011  . Syncope 07/17/2011  . EDEMA 07/12/2010  . Dizziness and giddiness 11/06/2008  . Diabetic neuropathy associated with type 2 diabetes mellitus ([place]) 10/15/2007  . Mixed hyperlipidemia 10/15/2007  . Chronic pain syndrome 10/15/2007  . Essential hypertension 10/15/2007  . Coronary atherosclerosis 10/15/2007  . Osteoarthrosis, unspecified whether generalized or localized, involving lower leg 10/15/2007    Current Outpatient Prescriptions on File Prior to Visit  Medication Sig Dispense Refill  . ACCU-CHEK FASTCLIX LANCETS MISC USE TO TEST BLOOD SUGAR THREE TIMES A DAY AND EVERY NIGHT AT BEDTIME 102 each 2  . ACCU-CHEK SMARTVIEW test strip USE TO TEST BLOOD SUGAR THREE TIMES A DAY AND EVERY NIGHT AT BEDTIME 100 each 2  . aspirin EC 81 MG tablet Take 81 mg by mouth at bedtime.     Marland Kitchen atorvastatin (LIPITOR) 40 MG tablet Take 1 tablet (40 mg total) by mouth daily. 90 tablet 1  . BIOTIN PO Take 1 tablet by mouth daily with breakfast.    . Cholecalciferol (VITAMIN D) 2000 UNITS CAPS Take 2,000 Units by mouth daily with breakfast.     . DULoxetine (CYMBALTA) 30 MG capsule Take 1 capsule (30 mg total) by mouth daily. 90 capsule 1  . Fe Bisgly-Succ-C-Thre-B12-FA (IRON-150 PO) Take 1 tablet by mouth daily.    . fluconazole (DIFLUCAN) 150 MG tablet Take 1 tablet (  150 mg total) by mouth daily. 1 tablet 0  . hydrALAZINE (APRESOLINE) 25 MG tablet TAKE 3 TABLETS BY MOUTH THREE TIMES A DAY 270 tablet 5  . lidocaine (XYLOCAINE) 5 % ointment APPLY TOPICALLY THREE TIMES A DAY AS NEEDED 100 g 1  . losartan-hydrochlorothiazide (HYZAAR) 100-25 MG tablet Take 1 tablet by mouth daily. 90 tablet 3  . metFORMIN (GLUCOPHAGE) 500 MG tablet TAKE 1 TABLET BY MOUTH TWICE DAILY WITH A MEAL 180 tablet 2  . metoprolol succinate (TOPROL-XL) 100 MG 24 hr tablet Take 1 tablet (100 mg total) by mouth daily. Take with or immediately following a meal. 90 tablet 1  . Multiple  Vitamin (MULTI-VITAMIN DAILY PO) Take 1 tablet by mouth daily.    . nitroGLYCERIN (NITROLINGUAL) 0.4 MG/SPRAY spray Place 1 spray under the tongue every 5 (five) minutes x 3 doses as needed for chest pain. 12 g 1  . omeprazole (PRILOSEC) 20 MG capsule TAKE 1 CAPSULE BY MOUTH EVERY DAY 90 capsule 1  . traMADol (ULTRAM) 50 MG tablet TAKE 2 TABLETS BY MOUTH TWICE DAILY 120 tablet 4   No current facility-administered medications on file prior to visit.     Past Medical History:  Diagnosis Date  . Anemia, iron deficiency: Severe 07/29/2016  . Anxiety   . Bronchitis, acute   . CAD (coronary artery disease)   . Cancer [facility])    Renal s/p nephrectomy  . CARPAL TUNNEL RELEASE, BILATERAL, HX OF 08/09/1998   Qualifier: Diagnosis of  By: Amil Amen MD, Benjamine Mola    . Chronic pain syndrome   . Diabetes mellitus type II, controlled ([place])   . Diabetes type 2, controlled ([place])   . Diabetic peripheral neuropathy ([place])   . Encounter for long-term (current) use of other medications   . GERD (gastroesophageal reflux disease)   . Hypertension   . 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 Mixed hyperlipidemia   . Obesity   . Osteoporosis   . Primary osteoarthritis of both knees   . S/P carpal tunnel release   . Sleep apnea    does not use CPAP  . Tobacco abuse   . Vertigo     Past Surgical History:  Procedure Laterality Date  . BREAST LUMPECTOMY WITH RADIOACTIVE SEED LOCALIZATION Left 11/30/2015   Procedure: BREAST LUMPECTOMY WITH RADIOACTIVE SEED LOCALIZATION;  Surgeon: Erroll Luna, MD;  Location: [facility];  Service: General;  Laterality: Left;  . CARPAL TUNNEL RELEASE Right 08/09/1998  . CARPAL TUNNEL RELEASE Left 09/08/1998  . CESAREAN SECTION     x3  . CORONARY ANGIOPLASTY WITH STENT PLACEMENT  2004   Normal LV function  . LIPOMA EXCISION Right    Right foot  . Resection of Left Acoustic Neuroma  1999  . TOTAL ABDOMINAL HYSTERECTOMY W/ BILATERAL SALPINGOOPHORECTOMY   04/12/1988   For fibroid tumors; [place], [place]    Social History   Social History  . Marital status: Divorced    Spouse name: N/A  . Number of children: N/A  . Years of education: N/A   Occupational History  . 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 Smoking status: Former Smoker    Packs/day: 0.25    Years: 50.00    Types: Cigarettes  . Smokeless tobacco: Never Used     Comment: quit 07/2016  . Alcohol use No  . Drug use: No  .  Sexual activity: No   Other Topics Concern  . Not on file   Social History Narrative   Divorced   Disability mainly for back and chronic intermittent vertigo - since tumor removed - no surgical correction for back   Lives alone, keeps her grandson, 15 yr old daughter is a mother    Family History  Problem Relation Age of Onset  . Leukemia Mother        CLL  . Diabetes type II Mother   . Other Mother        Hypercholesterolemia  . Osteoarthritis Mother   . COPD Mother   . Hypertension Mother   . Hodgkin's lymphoma Father   . Diabetes type II Sister   . Osteoporosis Sister   . Paranoid behavior Brother        Unsure if diagnosed with Schizophrenia  . Diabetes type II Sister   . Hypertension Sister   . Diabetes type II Sister   . Hypertension Sister   . Schizophrenia Sister   . Heart disease Sister        Enlarged heart  . Glaucoma Sister   . Alcohol abuse Sister   . Glaucoma Sister   . Alcohol abuse Sister   . Bipolar disorder Daughter   . Lupus Daughter        SLE  . Healthy Daughter     Review of Systems  Constitutional: Negative for appetite change, chills and fever.  Gastrointestinal: Negative for abdominal pain and nausea.  Genitourinary: Negative for difficulty urinating, dysuria, flank pain, frequency and hematuria.  Neurological: Negative for light-headedness and headaches.       Objective:   Vitals:   07/04/17 1002  BP: 130/82  Pulse: 70  Resp: 16    Temp: 98.4 F (36.9 C)  SpO2: 93%   Filed Weights   07/04/17 1002  Weight: 138 lb (62.6 kg)   Body mass index is 24.45 kg/m.  Wt Readings from Last 3 Encounters:  07/04/17 138 lb (62.6 kg)  05/28/17 141 lb (64 kg)  03/21/17 148 lb (67.1 kg)     Physical Exam Constitutional: Appears well-developed and well-nourished. No distress.  HENT:  Head: Normocephalic and atraumatic.  Cardiovascular: Normal rate, regular rhythm and normal heart sounds.   No murmur heard.   No edema Pulmonary/Chest: Effort normal and breath sounds normal. No respiratory distress. No has no wheezes. No rales.  Abd: soft, non tender Skin: Skin is warm and dry. Not diaphoretic.  Psychiatric: Normal mood and affect. Behavior is normal.    Labs reviewed   CT Renal Stone Study CLINICAL DATA:  Left flank pain radiating into left lower quadrant  EXAM: CT ABDOMEN AND PELVIS WITHOUT CONTRAST  TECHNIQUE: Multidetector CT imaging of the abdomen and pelvis was performed following the standard protocol without oral or intravenous contrast material administration.  COMPARISON:  July 29, 2016  FINDINGS: Lower chest: There is mild scarring in the lung bases. No lung base edema or consolidation. There is a small hiatal hernia.  Hepatobiliary: No focal liver lesions are appreciable on this noncontrast enhanced study. There is a Riedel's lobe on the right, an anatomic variant. Gallbladder wall is not appreciably thickened. There is no biliary duct dilatation.  Pancreas: There is no evident pancreatic mass or inflammatory focus.  Spleen: No splenic lesions are evident.  Adrenals/Urinary Tract: Adrenals appear unremarkable bilaterally. Right kidney is surgically absent. There are surgical clips in the right perinephric region, stable. There is no mass in the  right perinephric region. Left kidney shows no evident mass or hydronephrosis. There is a small focus of calcification in a peripheral renal  artery branch. There is no renal calculus on the left. There is no ureteral calculus appreciable on the left. Urinary bladder is midline with wall thickness within normal limits.  Stomach/Bowel: Rectum is mildly distended with stool and air. There is no appreciable bowel wall or mesenteric thickening. There is no evident bowel obstruction. No free air or portal venous air.  Vascular/Lymphatic: There is atherosclerotic calcification in the aorta and common iliac arteries. There is also calcification in each internal iliac artery. There is dilatation of the distal abdominal aorta with a maximum transverse diameter of 2.8 x 2.7 cm. There is no periaortic fluid. Major mesenteric vessels appear patent on this noncontrast enhanced study, although there is moderate atherosclerotic calcification at the origins of the major mesenteric vessels. There is no evident adenopathy in the abdomen or pelvis.  Reproductive: Uterus is absent.  There is no pelvic mass.  Other: There is no periappendiceal region inflammation. There is no ascites or abscess in the abdomen or pelvis.  Musculoskeletal: There is degenerative change in the lumbar spine. There is moderate spinal stenosis at L4-5 due to diffuse disc protrusion and bony hypertrophy. There are no blastic or lytic bone lesions. No intramuscular or abdominal wall lesion is evident.  IMPRESSION: 1. Status post right nephrectomy. No lesion is seen in the right perinephric region. Bowel extends into the right perinephric region.  2. No left-sided hydronephrosis. No left-sided renal or ureteral calculus evident. Urinary bladder is midline with wall thickness within normal limits.  3. No bowel obstruction. No bowel wall thickening. No abscess. No periappendiceal region inflammation.  4. Aortoiliac atherosclerosis. There is calcification in proximal major mesenteric vessels. Note that there is moderate calcification at the origin of the left renal  artery. The degree of calcification in the proximal left renal artery raises question of hypertension on a renovascular basis.  5. Mild dilatation of the distal aorta with a maximum transverse diameter 2.8 x 2.7 cm. Ectatic abdominal aorta at risk for aneurysm development. Recommend followup by ultrasound in 5 years. This recommendation follows ACR consensus guidelines: White Paper of the ACR Incidental Findings Committee II on Vascular Findings. J Am Coll Radiol 2013; 10:789-794.  6.  Spinal stenosis at L4-5, multifactorial.  Aortic Atherosclerosis (ICD10-I70.0).  Electronically Signed   By: Lowella Grip III M.D.   On: 06/21/2017 16:56        Assessment & Plan:   See Problem List for Assessment and Plan of chronic medical problems.

## 2017-07-04 ENCOUNTER — Ambulatory Visit (INDEPENDENT_AMBULATORY_CARE_PROVIDER_SITE_OTHER): Payer: Medicare Other | Admitting: Internal Medicine

## 2017-07-04 ENCOUNTER — Encounter: Payer: Self-pay | Admitting: Internal Medicine

## 2017-07-04 VITALS — BP 130/82 | HR 70 | Temp 98.4°F | Resp 16 | Wt 138.0 lb

## 2017-07-04 DIAGNOSIS — Z23 Encounter for immunization: Secondary | ICD-10-CM

## 2017-07-04 DIAGNOSIS — I10 Essential (primary) hypertension: Secondary | ICD-10-CM | POA: Diagnosis not present

## 2017-07-04 DIAGNOSIS — N12 Tubulo-interstitial nephritis, not specified as acute or chronic: Secondary | ICD-10-CM

## 2017-07-04 NOTE — Assessment & Plan Note (Signed)
Completed antibiotic No concerning symptoms Will not recheck urine, but advised her if she has any symptoms of a UTI which we reviewed we need to check her urine - advised these may be subtle Has urology follow up

## 2017-07-04 NOTE — Assessment & Plan Note (Signed)
BP elevated in ED - she did not take her meds that day BP well controlled Current regimen effective and well tolerated Continue current medications at current doses

## 2017-07-05 DIAGNOSIS — Z85528 Personal history of other malignant neoplasm of kidney: Secondary | ICD-10-CM | POA: Diagnosis not present

## 2017-07-05 DIAGNOSIS — C641 Malignant neoplasm of right kidney, except renal pelvis: Secondary | ICD-10-CM | POA: Diagnosis not present

## 2017-07-12 ENCOUNTER — Encounter: Payer: Self-pay | Admitting: Physical Medicine & Rehabilitation

## 2017-07-12 ENCOUNTER — Encounter: Payer: Medicare Other | Attending: Physical Medicine & Rehabilitation

## 2017-07-12 ENCOUNTER — Ambulatory Visit (HOSPITAL_BASED_OUTPATIENT_CLINIC_OR_DEPARTMENT_OTHER): Payer: Medicare Other | Admitting: Physical Medicine & Rehabilitation

## 2017-07-12 VITALS — BP 148/68 | HR 70

## 2017-07-12 DIAGNOSIS — M48061 Spinal stenosis, lumbar region without neurogenic claudication: Secondary | ICD-10-CM

## 2017-07-12 DIAGNOSIS — G8929 Other chronic pain: Secondary | ICD-10-CM | POA: Insufficient documentation

## 2017-07-12 DIAGNOSIS — Z825 Family history of asthma and other chronic lower respiratory diseases: Secondary | ICD-10-CM | POA: Insufficient documentation

## 2017-07-12 DIAGNOSIS — Z806 Family history of leukemia: Secondary | ICD-10-CM | POA: Insufficient documentation

## 2017-07-12 DIAGNOSIS — E782 Mixed hyperlipidemia: Secondary | ICD-10-CM | POA: Diagnosis not present

## 2017-07-12 DIAGNOSIS — Z6826 Body mass index (BMI) 26.0-26.9, adult: Secondary | ICD-10-CM | POA: Insufficient documentation

## 2017-07-12 DIAGNOSIS — Z87891 Personal history of nicotine dependence: Secondary | ICD-10-CM | POA: Insufficient documentation

## 2017-07-12 DIAGNOSIS — Z818 Family history of other mental and behavioral disorders: Secondary | ICD-10-CM | POA: Insufficient documentation

## 2017-07-12 DIAGNOSIS — I2510 Atherosclerotic heart disease of native coronary artery without angina pectoris: Secondary | ICD-10-CM | POA: Diagnosis not present

## 2017-07-12 DIAGNOSIS — M810 Age-related osteoporosis without current pathological fracture: Secondary | ICD-10-CM | POA: Insufficient documentation

## 2017-07-12 DIAGNOSIS — K219 Gastro-esophageal reflux disease without esophagitis: Secondary | ICD-10-CM | POA: Diagnosis not present

## 2017-07-12 DIAGNOSIS — Z8262 Family history of osteoporosis: Secondary | ICD-10-CM | POA: Insufficient documentation

## 2017-07-12 DIAGNOSIS — Z905 Acquired absence of kidney: Secondary | ICD-10-CM | POA: Diagnosis not present

## 2017-07-12 DIAGNOSIS — E669 Obesity, unspecified: Secondary | ICD-10-CM | POA: Diagnosis not present

## 2017-07-12 DIAGNOSIS — G4730 Sleep apnea, unspecified: Secondary | ICD-10-CM | POA: Diagnosis not present

## 2017-07-12 DIAGNOSIS — Z8261 Family history of arthritis: Secondary | ICD-10-CM | POA: Insufficient documentation

## 2017-07-12 DIAGNOSIS — Z811 Family history of alcohol abuse and dependence: Secondary | ICD-10-CM | POA: Diagnosis not present

## 2017-07-12 DIAGNOSIS — D509 Iron deficiency anemia, unspecified: Secondary | ICD-10-CM | POA: Insufficient documentation

## 2017-07-12 DIAGNOSIS — Z79899 Other long term (current) drug therapy: Secondary | ICD-10-CM | POA: Insufficient documentation

## 2017-07-12 DIAGNOSIS — Z833 Family history of diabetes mellitus: Secondary | ICD-10-CM | POA: Diagnosis not present

## 2017-07-12 DIAGNOSIS — E1142 Type 2 diabetes mellitus with diabetic polyneuropathy: Secondary | ICD-10-CM | POA: Diagnosis not present

## 2017-07-12 DIAGNOSIS — Z85528 Personal history of other malignant neoplasm of kidney: Secondary | ICD-10-CM | POA: Diagnosis not present

## 2017-07-12 DIAGNOSIS — Z8249 Family history of ischemic heart disease and other diseases of the circulatory system: Secondary | ICD-10-CM | POA: Insufficient documentation

## 2017-07-12 DIAGNOSIS — M170 Bilateral primary osteoarthritis of knee: Secondary | ICD-10-CM | POA: Insufficient documentation

## 2017-07-12 DIAGNOSIS — I10 Essential (primary) hypertension: Secondary | ICD-10-CM | POA: Diagnosis not present

## 2017-07-12 NOTE — Progress Notes (Signed)
Subjective:    Patient ID: Theresa Cline, female    DOB: 08-17-1951, 66 y.o.   MRN: [id number]  HPI  66 year old female with history of lumbar spinal stenosis. She has had chronic pain. She also has a history of right renal cell carcinoma in 2010 status post nephrectomy which was picked up as an incidental finding on one of her lumbar MRIs Back pain well controlled with tramadol Moving to [place] next month  HgbA1c 6.1, but feels like feet are more numb  Patient rides recumbent exercise bike 30 minutes twice a day Pain Inventory Average Pain 2 Pain Right Now 2 My pain is aching  In the last 24 hours, has pain interfered with the following? General activity 2 Relation with others 4 Enjoyment of life 6 What TIME of day is your pain at its worst? . Sleep (in general) Poor  Pain is worse with: bending, sitting and standing Pain improves with: heat/ice Relief from Meds: 5  Mobility ability to climb steps?  no do you drive?  no  Function retired  Neuro/Psych trouble walking dizziness confusion anxiety loss of taste or smell  Prior Studies Any changes since last visit?  no  Physicians involved in your care Any changes since last visit?  no   Family History  Problem Relation Age of Onset  . Leukemia Mother        CLL  . Diabetes type II Mother   . Other Mother        Hypercholesterolemia  . Osteoarthritis Mother   . COPD Mother   . Hypertension Mother   . Hodgkin's lymphoma Father   . Diabetes type II Sister   . Osteoporosis Sister   . Paranoid behavior Brother        Unsure if diagnosed with Schizophrenia  . Diabetes type II Sister   . Hypertension Sister   . Diabetes type II Sister   . Hypertension Sister   . Schizophrenia Sister   . Heart disease Sister        Enlarged heart  . Glaucoma Sister   . Alcohol abuse Sister   . Glaucoma Sister   . Alcohol abuse Sister   . Bipolar disorder Daughter   . Lupus Daughter        SLE  . Healthy Daughter      Social History   Social History  . Marital status: Divorced    Spouse name: N/A  . Number of children: N/A  . Years of education: N/A   Occupational History  . 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 Smoking status: Former Smoker    Packs/day: 0.25    Years: 50.00    Types: Cigarettes  . Smokeless tobacco: Never Used     Comment: quit 07/2016  . Alcohol use No  . Drug use: No  . Sexual activity: No   Other Topics Concern  . None   Social History Narrative   Divorced   Disability mainly for back and chronic intermittent vertigo - since tumor removed - no surgical correction for back   Lives alone, keeps her grandson, 98 yr old daughter is a mother   Past Surgical History:  Procedure Laterality Date  . BREAST LUMPECTOMY WITH RADIOACTIVE SEED LOCALIZATION Left 11/30/2015   Procedure: BREAST LUMPECTOMY WITH RADIOACTIVE SEED LOCALIZATION;  Surgeon: Erroll Luna, MD;  Location: [facility];  Service: General;  Laterality: Left;  . CARPAL TUNNEL RELEASE Right 08/09/1998  . CARPAL TUNNEL RELEASE Left 09/08/1998  . CESAREAN SECTION     x3  . CORONARY ANGIOPLASTY WITH STENT PLACEMENT  2004   Normal LV function  . LIPOMA EXCISION Right    Right foot  . Resection of Left Acoustic Neuroma  1999  . TOTAL ABDOMINAL HYSTERECTOMY W/ BILATERAL SALPINGOOPHORECTOMY  04/12/1988   For fibroid tumors; [facility], [place]   Past Medical History:  Diagnosis Date  . Anemia, iron deficiency: Severe 07/29/2016  . Anxiety   . Bronchitis, acute   . CAD (coronary artery disease)   . Cancer [facility])    Renal s/p nephrectomy  . CARPAL TUNNEL RELEASE, BILATERAL, HX OF 08/09/1998   Qualifier: Diagnosis of  By: Amil Amen MD, Benjamine Mola    . Chronic pain syndrome   . Diabetes mellitus type II, controlled ([place])   . Diabetes type 2, controlled ([place])   . Diabetic peripheral neuropathy ([place])   . Encounter for long-term (current) use of  other medications   . GERD (gastroesophageal reflux disease)   . Hypertension   . 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 Mixed hyperlipidemia   . Obesity   . Osteoporosis   . Primary osteoarthritis of both knees   . S/P carpal tunnel release   . Sleep apnea    does not use CPAP  . Tobacco abuse   . Vertigo    There were no vitals taken for this visit.  Opioid Risk Score:   Fall Risk Score:  `1  Depression screen PHQ 2/9  Depression screen [facility] 2/9 07/04/2017 03/21/2017 07/28/2016 05/06/2015 09/25/2014  Decreased Interest 0 0 0 0 0  Down, Depressed, Hopeless 0 0 0 0 0  PHQ - 2 Score 0 0 0 0 0  Altered sleeping - - - 3 -  Tired, decreased energy - - - 0 -  Change in appetite - - - 3 -  Feeling bad or failure about yourself  - - - 0 -  Trouble concentrating - - - 2 -  Moving slowly or fidgety/restless - - - 0 -  Suicidal thoughts - - - 0 -  PHQ-9 Score - - - 8 -  Some recent data might be hidden     Review of Systems  Constitutional: Negative.   HENT: Negative.   Eyes: Negative.   Respiratory: Negative.   Cardiovascular: Negative.   Gastrointestinal: Positive for nausea.  Endocrine: Negative.   Genitourinary: Negative.   Musculoskeletal: Positive for joint swelling.  Skin: Negative.   Allergic/Immunologic: Negative.   Neurological: Negative.   Hematological: Negative.   Psychiatric/Behavioral: Negative.   All other systems reviewed and are negative.      Objective:   Physical Exam  Constitutional: She is oriented to person, place, and time. She appears well-developed and well-nourished.  HENT:  Head: Normocephalic and atraumatic.  Eyes: Pupils are equal, round, and reactive to light. Conjunctivae and EOM are normal.  Neurological: She is alert and oriented to person, place, and time.  Skin: Skin is warm and dry.  Nursing note and vitals reviewed.  small varicosities in the Feet bilaterally but otherwise no skin lesions, no calluses. Intact sensation to  pinprick and light touch bilateral lower extremities. Motor strength is 5/5 bilateral hip flexor, knee extensor, ankle dorsiflexor       Assessment & Plan:  1.  Lumbar spinal stenosis without radiculopathy or myelopathy. Her chronic low back pain has been  well controlled with tramadol 100 mg twice a day  The patient has had flareups in the past, which were relieved by lumbar medial branch blocks. Patient is moving to [place], [place], I have given her the name of a former physical medicine rehabilitation colleague, Dr. Drexel Iha who is in [place]  2. Diabetes with peripheral neuropathy, PCP is prescribing duloxetine 30 mg per day

## 2017-07-12 NOTE — Patient Instructions (Signed)
Dr. Drexel Iha, physical medicine and rehabilitation [place], [place]

## 2017-07-23 ENCOUNTER — Other Ambulatory Visit: Payer: Self-pay | Admitting: Adult Health

## 2017-07-23 ENCOUNTER — Ambulatory Visit
Admission: RE | Admit: 2017-07-23 | Discharge: 2017-07-23 | Disposition: A | Discharge status: Home / Self Care | Payer: Medicare Other | Source: Ambulatory Visit | Attending: Adult Health | Admitting: Adult Health

## 2017-07-23 DIAGNOSIS — C641 Malignant neoplasm of right kidney, except renal pelvis: Secondary | ICD-10-CM

## 2017-07-23 DIAGNOSIS — I10 Essential (primary) hypertension: Secondary | ICD-10-CM | POA: Diagnosis not present

## 2017-07-25 NOTE — Progress Notes (Signed)
Diabetes  patient states that she is a diabetic has neurological symptoms and reduced circulation and has lesions that become very painful.   Review of Systems  All other systems reviewed and are negative.      Objective:   Physical Exam  Nursing note and vitals reviewed. Constitutional: She appears well-nourished.  Musculoskeletal: Normal range of motion.  Neurological: She is alert.  Skin: Skin is warm.   Patient is keratotic lesions fifth digit left over right and fifth metatarsal bilateral.   lesions are painful when pressed. Are diminished bilateral  Assessment:     At risk diabetic with lesion formation and structural malalignment    Plan:     H&P performed and discussion about daily inspections reviewed. I then debrided the lesions on both feet which was tolerated well with no iatrogenic bleeding noted. Will start the paperwork for diabetic shoes today.

## 2017-08-20 NOTE — Progress Notes (Signed)
Subjective:    Patient ID: Theresa Cline, female    DOB: 06-Sep-1951, 66 y.o.   MRN: [id number]  HPI She is here for follow up.  Abnormal chest x-ray:  She has a chest xray done by urology last month due to her history of renal cell carcinoma.  There was an abnormality and they recommended that she consider seeing a pulmonologist.  There was a question of prominence of the right anterior right seventh rib end versus underlying parenchymal lung lesion.  She has cough/ wheeze for one month.  No change in severity-her symptoms have just been persistent.  She wheezes occasionally when she feels the congestion in her throat that is she is not able to bring up.  She denies any significant nasal congestion.  She does have occasional postnasal drip.  She denies any seasonal allergies.  The cough is primarily dry.  She denies fever/chills.      Diabetes: She is taking her medication daily as prescribed. She is compliant with a diabetic diet. She is exercising regularly - walking 3/week.  She was concerned that her last A1c was slightly elevated and does not understand why it had gone up.  Hypertension: She is taking her medication daily. She is compliant with a low sodium diet.  She denies chest pain, palpitations, edema, shortness of breath and regular headaches. She is exercising regularly.     Medications and allergies reviewed with patient and updated if appropriate.  Patient Active Problem List   Diagnosis Date Noted  . Abnormal chest xray 08/22/2017  . Pyelonephritis 07/04/2017  . Vaginal discharge 05/28/2017  . History of renal cell carcinoma 02/05/2017  . Dyspnea on exertion 07/29/2016  . Lightheadedness 07/29/2016  . Symptomatic anemia 07/28/2016  . DM type 2, controlled, with complication ([place]) 79/11/4095  . Osteopenia 03/15/2016  . Facet arthropathy, lumbosacral 02/24/2016  . GERD (gastroesophageal reflux disease) 02/09/2016  . Acoustic neuroma ([place]) 02/09/2016  . Right  knee DJD 01/18/2012  . Trochanteric bursitis of both hips 12/11/2011  . Spinal stenosis, lumbar region, without neurogenic claudication 12/11/2011  . Syncope 07/17/2011  . EDEMA 07/12/2010  . Dizziness and giddiness 11/06/2008  . Diabetic neuropathy associated with type 2 diabetes mellitus ([place]) 10/15/2007  . Mixed hyperlipidemia 10/15/2007  . Chronic pain syndrome 10/15/2007  . Essential hypertension 10/15/2007  . Coronary atherosclerosis 10/15/2007  . Osteoarthrosis, unspecified whether generalized or localized, involving lower leg 10/15/2007    Current Outpatient Medications on File Prior to Visit  Medication Sig Dispense Refill  . ACCU-CHEK FASTCLIX LANCETS MISC USE TO TEST BLOOD SUGAR THREE TIMES A DAY AND EVERY NIGHT AT BEDTIME 102 each 2  . ACCU-CHEK SMARTVIEW test strip USE TO TEST BLOOD SUGAR THREE TIMES A DAY AND EVERY NIGHT AT BEDTIME 100 each 2  . aspirin EC 81 MG tablet Take 81 mg by mouth at bedtime.     Marland Kitchen atorvastatin (LIPITOR) 40 MG tablet Take 1 tablet (40 mg total) by mouth daily. 90 tablet 1  . BIOTIN PO Take 1 tablet by mouth daily with breakfast.    . Cholecalciferol (VITAMIN D) 2000 UNITS CAPS Take 2,000 Units by mouth daily with breakfast.     . DULoxetine (CYMBALTA) 30 MG capsule Take 1 capsule (30 mg total) by mouth daily. 90 capsule 1  . Fe Bisgly-Succ-C-Thre-B12-FA (IRON-150 PO) Take 1 tablet by mouth daily.    . fluconazole (DIFLUCAN) 150 MG tablet Take 1 tablet (150 mg total) by mouth daily. 1 tablet 0  .  hydrALAZINE (APRESOLINE) 25 MG tablet TAKE 3 TABLETS BY MOUTH THREE TIMES A DAY 270 tablet 5  . lidocaine (XYLOCAINE) 5 % ointment APPLY TOPICALLY THREE TIMES A DAY AS NEEDED 100 g 1  . losartan-hydrochlorothiazide (HYZAAR) 100-25 MG tablet Take 1 tablet by mouth daily. 90 tablet 3  . metFORMIN (GLUCOPHAGE) 500 MG tablet TAKE 1 TABLET BY MOUTH TWICE DAILY WITH A MEAL 180 tablet 2  . metoprolol succinate (TOPROL-XL) 100 MG 24 hr tablet Take 1 tablet (100 mg  total) by mouth daily. Take with or immediately following a meal. 90 tablet 1  . Multiple Vitamin (MULTI-VITAMIN DAILY PO) Take 1 tablet by mouth daily.    . nitroGLYCERIN (NITROLINGUAL) 0.4 MG/SPRAY spray Place 1 spray under the tongue every 5 (five) minutes x 3 doses as needed for chest pain. 12 g 1  . omeprazole (PRILOSEC) 20 MG capsule TAKE 1 CAPSULE BY MOUTH EVERY DAY 90 capsule 1  . traMADol (ULTRAM) 50 MG tablet TAKE 2 TABLETS BY MOUTH TWICE DAILY 120 tablet 4   No current facility-administered medications on file prior to visit.     Past Medical History:  Diagnosis Date  . Anemia, iron deficiency: Severe 07/29/2016  . Anxiety   . Bronchitis, acute   . CAD (coronary artery disease)   . Cancer [facility])    Renal s/p nephrectomy  . CARPAL TUNNEL RELEASE, BILATERAL, HX OF 08/09/1998   Qualifier: Diagnosis of  By: Amil Amen MD, Benjamine Mola    . Chronic pain syndrome   . Diabetes mellitus type II, controlled ([place])   . Diabetes type 2, controlled ([place])   . Diabetic peripheral neuropathy ([place])   . Encounter for long-term (current) use of other medications   . GERD (gastroesophageal reflux disease)   . Hypertension   . 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 Mixed hyperlipidemia   . Obesity   . Osteoporosis   . Primary osteoarthritis of both knees   . S/P carpal tunnel release   . Sleep apnea    does not use CPAP  . Tobacco abuse   . Vertigo     Past Surgical History:  Procedure Laterality Date  . CARPAL TUNNEL RELEASE Right 08/09/1998  . CARPAL TUNNEL RELEASE Left 09/08/1998  . CESAREAN SECTION     x3  . CORONARY ANGIOPLASTY WITH STENT PLACEMENT  2004   Normal LV function  . LIPOMA EXCISION Right    Right foot  . Resection of Left Acoustic Neuroma  1999  . TOTAL ABDOMINAL HYSTERECTOMY W/ BILATERAL SALPINGOOPHORECTOMY  04/12/1988   For fibroid tumors; [place], [place]    Social History   Socioeconomic History  . Marital status: Divorced    Spouse name: None  . Number  of children: None  . Years of education: None  . Highest education level: None  Social Needs  . Financial resource strain: None  . Food insecurity - worry: None  . Food insecurity - inability: None  . Transportation needs - medical: None  . Transportation needs - non-medical: None  Occupational History  . Occupation: Barrister's clerk in hotel    Comment: Disabled  . Occupation: Retail    Comment: Disabled  Tobacco Use  . Smoking status: Former Smoker    Packs/day: 0.25    Years: 50.00    Pack years: 12.50    Types: Cigarettes  . Smokeless tobacco: Never Used  . Tobacco comment: quit 07/2016  Substance and Sexual Activity  . Alcohol use: No  Alcohol/week: 0.0 oz  . Drug use: No  . Sexual activity: No  Other Topics Concern  . None  Social History Narrative   Divorced   Disability mainly for back and chronic intermittent vertigo - since tumor removed - no surgical correction for back   Lives alone, keeps her grandson, 17 yr old daughter is a mother    Family History  Problem Relation Age of Onset  . Leukemia Mother        CLL  . Diabetes type II Mother   . Other Mother        Hypercholesterolemia  . Osteoarthritis Mother   . COPD Mother   . Hypertension Mother   . Hodgkin's lymphoma Father   . Diabetes type II Sister   . Osteoporosis Sister   . Paranoid behavior Brother        Unsure if diagnosed with Schizophrenia  . Diabetes type II Sister   . Hypertension Sister   . Diabetes type II Sister   . Hypertension Sister   . Schizophrenia Sister   . Heart disease Sister        Enlarged heart  . Glaucoma Sister   . Alcohol abuse Sister   . Glaucoma Sister   . Alcohol abuse Sister   . Bipolar disorder Daughter   . Lupus Daughter        SLE  . Healthy Daughter     Review of Systems  Constitutional: Negative for chills and fever.  HENT: Positive for postnasal drip (intermittent). Negative for congestion and sore throat.   Respiratory: Positive for cough  (sometimes brings phlegm up) and wheezing. Negative for shortness of breath.   Cardiovascular: Negative for chest pain, palpitations and leg swelling.  Gastrointestinal: Negative for nausea.       No gerd  Neurological: Positive for headaches (intermittent, last week, none this week). Negative for light-headedness.       Objective:   Vitals:   08/22/17 1130  BP: 134/76  Pulse: 68  Resp: 16  Temp: 97.9 F (36.6 C)  SpO2: 94%   Filed Weights   08/22/17 1130  Weight: 139 lb (63 kg)   Body mass index is 24.62 kg/m.  Wt Readings from Last 3 Encounters:  08/22/17 139 lb (63 kg)  07/04/17 138 lb (62.6 kg)  05/28/17 141 lb (64 kg)     Physical Exam Constitutional: Appears well-developed and well-nourished. No distress.  HENT:  Head: Normocephalic and atraumatic.  Neck: Neck supple. No tracheal deviation present. No thyromegaly present.  No cervical lymphadenopathy Cardiovascular: Normal rate, regular rhythm and normal heart sounds.   No murmur heard.   No edema Pulmonary/Chest: Effort normal and breath sounds normal. No respiratory distress. No has no wheezes. No rales.  Skin: Skin is warm and dry. Not diaphoretic.  Psychiatric: Normal mood and affect. Behavior is normal.        Assessment & Plan:   See Problem List for Assessment and Plan of chronic medical problems.

## 2017-08-22 ENCOUNTER — Ambulatory Visit (INDEPENDENT_AMBULATORY_CARE_PROVIDER_SITE_OTHER)
Admission: RE | Admit: 2017-08-22 | Discharge: 2017-08-22 | Disposition: A | Discharge status: Home / Self Care | Payer: Medicare Other | Source: Ambulatory Visit | Attending: Internal Medicine | Admitting: Internal Medicine

## 2017-08-22 ENCOUNTER — Other Ambulatory Visit (INDEPENDENT_AMBULATORY_CARE_PROVIDER_SITE_OTHER): Payer: Medicare Other

## 2017-08-22 ENCOUNTER — Encounter: Payer: Self-pay | Admitting: Internal Medicine

## 2017-08-22 ENCOUNTER — Ambulatory Visit (INDEPENDENT_AMBULATORY_CARE_PROVIDER_SITE_OTHER): Payer: Medicare Other | Admitting: Internal Medicine

## 2017-08-22 VITALS — BP 134/76 | HR 68 | Temp 97.9°F | Resp 16 | Wt 139.0 lb

## 2017-08-22 DIAGNOSIS — E118 Type 2 diabetes mellitus with unspecified complications: Secondary | ICD-10-CM

## 2017-08-22 DIAGNOSIS — J449 Chronic obstructive pulmonary disease, unspecified: Secondary | ICD-10-CM | POA: Diagnosis not present

## 2017-08-22 DIAGNOSIS — R9389 Abnormal findings on diagnostic imaging of other specified body structures: Secondary | ICD-10-CM

## 2017-08-22 DIAGNOSIS — I10 Essential (primary) hypertension: Secondary | ICD-10-CM | POA: Diagnosis not present

## 2017-08-22 LAB — COMPREHENSIVE METABOLIC PANEL
ALK PHOS: 51 U/L (ref 39–117)
ALT: 11 U/L (ref 0–35)
AST: 19 U/L (ref 0–37)
Albumin: 4.1 g/dL (ref 3.5–5.2)
BUN: 13 mg/dL (ref 6–23)
CALCIUM: 10.6 mg/dL — AB (ref 8.4–10.5)
CO2: 35 mEq/L — ABNORMAL HIGH (ref 19–32)
Chloride: 97 mEq/L (ref 96–112)
Creatinine, Ser: 1.04 mg/dL (ref 0.40–1.20)
GFR: 67.62 mL/min (ref >60.00)
GLUCOSE: 80 mg/dL (ref 70–99)
POTASSIUM: 4.4 meq/L (ref 3.5–5.1)
Sodium: 137 mEq/L (ref 135–145)
TOTAL PROTEIN: 7.3 g/dL (ref 6.0–8.3)
Total Bilirubin: 0.5 mg/dL (ref 0.2–1.2)

## 2017-08-22 LAB — HEMOGLOBIN A1C: HEMOGLOBIN A1C: 5.9 % (ref 4.6–6.5)

## 2017-08-22 NOTE — Assessment & Plan Note (Signed)
Recheck CXR today - may need Ct scan of chest Referral ordered for pulmonary

## 2017-08-22 NOTE — Assessment & Plan Note (Signed)
BP well controlled Current regimen effective and well tolerated Continue current medications at current doses cmp  

## 2017-08-22 NOTE — Patient Instructions (Addendum)
Have a chest xray today.  We will call you with the results.   Have blood work done today.    A referral was ordered for pulmonary (lung) doctors.

## 2017-08-22 NOTE — Assessment & Plan Note (Signed)
Check a1c Low sugar / carb diet Stressed regular exercise   

## 2017-09-17 ENCOUNTER — Institutional Professional Consult (permissible substitution): Payer: Medicare Other | Admitting: Pulmonary Disease

## 2017-09-21 ENCOUNTER — Telehealth: Payer: Self-pay | Admitting: Internal Medicine

## 2017-09-21 NOTE — Telephone Encounter (Signed)
Copied from [place]. Topic: Quick Communication - See Telephone Encounter >> Sep 21, 2017  3:15 PM Robina Ade, Helene Kelp D wrote: CRM for notification. See Telephone encounter for: 09/21/17. Patient called and said she got a letter from her insurance saying that her medication losartan-hydrochlorothiazide (HYZAAR) 100-25 MG tablet was recalled. I asked the patient if she contacted the pharmacy and she said no because she got the letter and she needs something different called in.

## 2017-09-21 NOTE — Telephone Encounter (Signed)
Will send separate losartan and hctz for now.  Pending - send to desired pharmacy.. Let her know.

## 2017-09-21 NOTE — Telephone Encounter (Signed)
Recall on pt's Hyzaar

## 2017-09-24 MED ORDER — LOSARTAN POTASSIUM 100 MG PO TABS: 100.0000 mg | ORAL_TABLET | Freq: Every day | ORAL | 3 refills | Status: DC

## 2017-09-24 MED ORDER — HYDROCHLOROTHIAZIDE 25 MG PO TABS: 25.0000 mg | ORAL_TABLET | Freq: Every day | ORAL | 3 refills | Status: DC

## 2017-09-24 NOTE — Telephone Encounter (Signed)
Notified pt w/MD response. Sent new rx's to [facility]...Johny Chess

## 2017-09-26 ENCOUNTER — Ambulatory Visit (INDEPENDENT_AMBULATORY_CARE_PROVIDER_SITE_OTHER): Payer: Medicare Other | Admitting: Internal Medicine

## 2017-09-26 ENCOUNTER — Encounter: Payer: Self-pay | Admitting: Internal Medicine

## 2017-09-26 VITALS — BP 90/60 | HR 80 | Ht 63.0 in | Wt 139.0 lb

## 2017-09-26 DIAGNOSIS — F17210 Nicotine dependence, cigarettes, uncomplicated: Secondary | ICD-10-CM | POA: Insufficient documentation

## 2017-09-26 DIAGNOSIS — R0602 Shortness of breath: Secondary | ICD-10-CM | POA: Diagnosis not present

## 2017-09-26 DIAGNOSIS — R9389 Abnormal findings on diagnostic imaging of other specified body structures: Secondary | ICD-10-CM

## 2017-09-26 DIAGNOSIS — R0609 Other forms of dyspnea: Secondary | ICD-10-CM | POA: Diagnosis not present

## 2017-09-26 NOTE — Assessment & Plan Note (Signed)
>   3 min discussion I reviewed the Fletcher curve with the patient that basically indicates  if you quit smoking when your best day FEV1 is still  preserved (as is probably still  the case here)  it is highly unlikely you will progress to severe disease and informed the patient there was  no medication on the market that has proven to alter the curve/ its downward trajectory  or the likelihood of progression of their disease(unlike other chronic medical conditions such as atheroclerosis where we do think we can change the natural hx with risk reducing meds)    Therefore stopping smoking and maintaining abstinence are  the most important aspects of care, not choice of inhalers or for that matter, doctors.   Treatment other than smoking cessation  is entirely directed by severity of symptoms and focused also on reducing exacerbations, not attempting to change the natural history of the disease.    Since symptoms are so minimal and she is not prone at this point to aecopd no rx required at this point, just focus on committing to quit

## 2017-09-26 NOTE — Patient Instructions (Addendum)
Try to cut back on your smoking as much as you can    Please schedule a follow up office visit in 6 weeks, call sooner if needed with pfts on return  Add:  Discuss low dose ct next ov

## 2017-09-26 NOTE — Progress Notes (Signed)
Subjective:     Patient ID: Theresa Cline, female   DOB: [birth year],    MRN: [id number]  HPI   8 yobf  Active smoker s/p R Nephrectomy 2011 s need for any additional rx  referred to pulmonary clinic 09/26/2017 by Dr   Billey Gosling for abn cxr.   09/26/2017 1st [facility] office visit/ Offie Waide   Chief Complaint  Patient presents with  . Pulmonary Consult    Referred by Dr. Marzetta Board burns for eval of abnormal cxr. She states she has noticed some DOE for the past 4-5 months "I assumed it's b/c I am a smoker". She gets winded with heavy exertion or when she gets in a hurry. She also c/o non prod cough for the past few months.   indolent onset mild doe x 5 m = MMRC2 = can't walk a nl pace on a flat grade s sob but does fine slow and flat / somewhat limited by vertigo x 1999 and uses 4 pronged walker, minimal  real change from baseline x 5 months and assoc miniimal cough which is not productive and does not disturb her sleep.   No obvious day to day or daytime variability or assoc excess/ purulent sputum or mucus plugs or hemoptysis or cp or chest tightness, subjective wheeze or overt sinus or hb symptoms. No unusual exposure hx or h/o childhood pna/ asthma or knowledge of premature birth.  Sleeping ok flat without nocturnal  or early am exacerbation  of respiratory  c/o's or need for noct saba. Also denies any obvious fluctuation of symptoms with weather or environmental changes or other aggravating or alleviating factors except as outlined above   Current Allergies, Complete Past Medical History, Past Surgical History, Family History, and Social History were reviewed in [employer] record.  ROS  The following are not active complaints unless bolded Hoarseness, sore throat, dysphagia, dental problems, itching, sneezing,  nasal congestion or discharge of excess mucus or purulent secretions, ear ache,   fever, chills, sweats, unintended wt loss or wt gain, classically  pleuritic or exertional cp,  orthopnea pnd or leg swelling, presyncope, palpitations, abdominal pain, anorexia, nausea, vomiting, diarrhea  or change in bowel habits or change in bladder habits, change in stools or change in urine, dysuria, hematuria,  rash, arthralgias/ chronic back pain , visual complaints, headache, numbness, weakness or ataxia or problems with walking or coordination,  change in mood/affect or memory.        Current Meds  Medication Sig  . ACCU-CHEK FASTCLIX LANCETS MISC USE TO TEST BLOOD SUGAR THREE TIMES A DAY AND EVERY NIGHT AT BEDTIME  . ACCU-CHEK SMARTVIEW test strip USE TO TEST BLOOD SUGAR THREE TIMES A DAY AND EVERY NIGHT AT BEDTIME  . aspirin EC 81 MG tablet Take 81 mg by mouth at bedtime.   Marland Kitchen atorvastatin (LIPITOR) 40 MG tablet Take 1 tablet (40 mg total) by mouth daily.  Marland Kitchen BIOTIN PO Take 1 tablet by mouth daily with breakfast.  . Cholecalciferol (VITAMIN D) 2000 UNITS CAPS Take 2,000 Units by mouth daily with breakfast.   . DULoxetine (CYMBALTA) 30 MG capsule Take 1 capsule (30 mg total) by mouth daily.  Marland Kitchen Fe Bisgly-Succ-C-Thre-B12-FA (IRON-150 PO) Take 1 tablet by mouth daily.  . hydrALAZINE (APRESOLINE) 25 MG tablet TAKE 3 TABLETS BY MOUTH THREE TIMES A DAY  . lidocaine (XYLOCAINE) 5 % ointment APPLY TOPICALLY THREE TIMES A DAY AS NEEDED  . losartan (COZAAR) 100 MG tablet Take 1 tablet (  100 mg total) by mouth daily.  . metFORMIN (GLUCOPHAGE) 500 MG tablet TAKE 1 TABLET BY MOUTH TWICE DAILY WITH A MEAL  . metoprolol succinate (TOPROL-XL) 100 MG 24 hr tablet Take 1 tablet (100 mg total) by mouth daily. Take with or immediately following a meal.  . Multiple Vitamin (MULTI-VITAMIN DAILY PO) Take 1 tablet by mouth daily.  . nitroGLYCERIN (NITROLINGUAL) 0.4 MG/SPRAY spray Place 1 spray under the tongue every 5 (five) minutes x 3 doses as needed for chest pain.  Marland Kitchen omeprazole (PRILOSEC) 20 MG capsule TAKE 1 CAPSULE BY MOUTH EVERY DAY  . traMADol (ULTRAM) 50 MG tablet  TAKE 2 TABLETS BY MOUTH TWICE DAILY           Review of Systems     Objective:   Physical Exam Pleasant amb bf nad   Wt Readings from Last 3 Encounters:  09/26/17 139 lb (63 kg)  08/22/17 139 lb (63 kg)  07/04/17 138 lb (62.6 kg)     Vital signs reviewed - Note on arrival 02 sats  98% on RA     HEENT: nl dentition, turbinates bilaterally, and oropharynx.   external ear canals obst by wax bilaterally without cough reflex   NECK :  without JVD/Nodes/TM/ nl carotid upstrokes bilaterally   LUNGS: no acc muscle use,  Nl contour chest with insp pops, squeaks both bases and minimal exp rhonchi maneuvers   CV:  RRR  no s3 or murmur or increase in P2, and no edema   ABD:  soft and nontender with  Pos late insp hoover's sign in the supine position. No bruits or organomegaly appreciated, bowel sounds nl  MS:  Nl gait/ ext warm without deformities, calf tenderness, cyanosis or clubbing No obvious joint restrictions   SKIN: warm and dry without lesions    NEURO:  alert, approp, nl sensorium with  no motor or cerebellar deficits apparent.      I personally reviewed images and agree with radiology impression as follows:  CXR:   08/22/17 1. No focal pulmonary nodule. The previous abnormality  likely represented a nipple shadow. 2. Stable changes of COPD. 3. Aortic atherosclerosis. 4. Enlarged pulmonary artery is, likely pulmonary arterial hypertension.     Assessment:

## 2017-09-26 NOTE — Assessment & Plan Note (Addendum)
cxr reviewed > She dose appear to have mild PH /copd c/w cor pulmonale so main goal in short term is stop smoking (see separate a/p)  And return for baseline pfts.  No further cxr's needed for now   Low-dose CT lung cancer screening is recommended for patients who are 71-68 years of age with a 30+ pack-year history of smoking, and who are currently smoking or quit <=15 years ago so she does meet the criteria but for now wants to work on stopping smoking to reduce risk. Will re-visit this issue at f/u ov

## 2017-09-26 NOTE — Assessment & Plan Note (Addendum)
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 > rx for both for now is stop smoking rather than adding medications at this point based on cost/benefit (see cig smoking a/p)  Discussed in detail all the  indications, usual  risks and alternatives  relative to the benefits with patient who agrees to proceed with conservative f/u as outlined    Total time devoted to counseling  > 50 % of initial 60 min office visit:  review case with pt/ discussion of options/alternatives/ personally creating written customized instructions  in presence of pt  then going over those specific  Instructions directly with the pt including how to use all of the meds but in particular covering each new medication in detail and the difference between the maintenance= "automatic" meds and the prns using an action plan format for the latter (If this problem/symptom => do that organization reading Left to right).  Please see AVS from this visit for a full list of these instructions which I personally wrote for this pt and  are unique to this visit.

## 2017-09-27 ENCOUNTER — Other Ambulatory Visit: Payer: Self-pay | Admitting: Internal Medicine

## 2017-10-03 ENCOUNTER — Other Ambulatory Visit: Payer: Self-pay | Admitting: Internal Medicine

## 2017-10-22 DIAGNOSIS — H43813 Vitreous degeneration, bilateral: Secondary | ICD-10-CM | POA: Diagnosis not present

## 2017-10-31 ENCOUNTER — Other Ambulatory Visit: Payer: Self-pay | Admitting: Internal Medicine

## 2017-10-31 ENCOUNTER — Other Ambulatory Visit: Payer: Self-pay | Admitting: Physical Medicine & Rehabilitation

## 2017-11-01 ENCOUNTER — Other Ambulatory Visit: Payer: Self-pay | Admitting: Emergency Medicine

## 2017-11-01 MED ORDER — NITROGLYCERIN 0.4 MG/SPRAY TL SOLN: 1.0000 | 1 refills | Status: DC | PRN

## 2017-11-08 ENCOUNTER — Ambulatory Visit (INDEPENDENT_AMBULATORY_CARE_PROVIDER_SITE_OTHER): Payer: Medicare Other | Admitting: Internal Medicine

## 2017-11-08 ENCOUNTER — Encounter: Payer: Self-pay | Admitting: Internal Medicine

## 2017-11-08 VITALS — BP 128/70 | HR 62 | Ht 63.0 in | Wt 142.0 lb

## 2017-11-08 DIAGNOSIS — R0602 Shortness of breath: Secondary | ICD-10-CM | POA: Diagnosis not present

## 2017-11-08 DIAGNOSIS — F17210 Nicotine dependence, cigarettes, uncomplicated: Secondary | ICD-10-CM

## 2017-11-08 DIAGNOSIS — J449 Chronic obstructive pulmonary disease, unspecified: Secondary | ICD-10-CM | POA: Diagnosis not present

## 2017-11-08 DIAGNOSIS — I10 Essential (primary) hypertension: Secondary | ICD-10-CM

## 2017-11-08 LAB — PULMONARY FUNCTION TEST
DL/VA % PRED: 84 %
DL/VA: 3.94 ml/min/mmHg/L
DLCO UNC % PRED: 54 %
DLCO UNC: 12.49 ml/min/mmHg
FEF 25-75 PRE: 0.29 L/s
FEF 25-75 Post: 0.47 L/sec
FEF2575-%Change-Post: 65 %
FEF2575-%PRED-PRE: 17 %
FEF2575-%Pred-Post: 28 %
FEV1-%Change-Post: 19 %
FEV1-%Pred-Post: 46 %
FEV1-%Pred-Pre: 39 %
FEV1-Post: 0.83 L
FEV1-Pre: 0.69 L
FEV1FVC-%Change-Post: 0 %
FEV1FVC-%Pred-Pre: 63 %
FEV6-%Change-Post: 19 %
FEV6-%PRED-POST: 73 %
FEV6-%Pred-Pre: 61 %
FEV6-POST: 1.63 L
FEV6-Pre: 1.36 L
FEV6FVC-%CHANGE-POST: 0 %
FEV6FVC-%PRED-POST: 101 %
FEV6FVC-%Pred-Pre: 102 %
FVC-%Change-Post: 20 %
FVC-%Pred-Post: 73 %
FVC-%Pred-Pre: 60 %
FVC-Post: 1.67 L
FVC-Pre: 1.39 L
PRE FEV1/FVC RATIO: 50 %
Post FEV1/FVC ratio: 49 %
Post FEV6/FVC ratio: 97 %
Pre FEV6/FVC Ratio: 98 %
RV % pred: 159 %
RV: 3.39 L
TLC % PRED: 101 %
TLC: 4.95 L

## 2017-11-08 NOTE — Patient Instructions (Signed)
Take toprol 100 mg one half twice daily   Only use your albuterol as a rescue medication to be used if you can't catch your breath by resting or doing a relaxed purse lip breathing pattern.  - The less you use it, the better it will work when you need it. - Ok to use up to 2 puffs  every 4 hours if you must but call for immediate appointment if use goes up over your usual need - Don't leave home without it !!  (think of it like the spare tire for your car)   If you start taking the albuterol more than twice daily > return here for trial of a stronger inhaler

## 2017-11-08 NOTE — Progress Notes (Signed)
Subjective:     Patient ID: Theresa Cline, female   DOB: 1951/07/08,    MRN: [id number]     Brief patient profile:  31 yobf  Active smoker s/p R Nephrectomy 2011 s need for any additional rx  referred to pulmonary clinic 09/26/2017 by Dr   Billey Gosling for abn cxr with GOLD III copd 11/08/2017     History of Present Illness  09/26/2017 1st [facility] office visit/ Halyn Flaugher   Chief Complaint  Patient presents with  . Pulmonary Consult    Referred by Dr. Marzetta Board burns for eval of abnormal cxr. She states she has noticed some DOE for the past 4-5 months "I assumed it's b/c I am a smoker". She gets winded with heavy exertion or when she gets in a hurry. She also c/o non prod cough for the past few months.   indolent onset mild doe x 5 m = MMRC2 = can't walk a nl pace on a flat grade s sob but does fine slow and flat / somewhat limited by vertigo x 1999 and uses 4 pronged walker, minimal  real change from baseline x 5 months and assoc miniimal cough which is not productive and does not disturb her sleep. rec Try to cut back on your smoking as much as you can    11/08/2017  f/u ov/Brandis Matsuura re:  GOLD III copd / still smokin g Chief Complaint  Patient presents with  . Follow-up    PFT's done. She has not smoked in a few days. Breathing is doing well.  She has not had to use her albuterol inhaler.   Dyspnea:  Really limited more by balance and not feeling she needs the saba at all  Cough: min in am only/ mucoid Sleep: ok  No obvious day to day or daytime variability or assoc excess/ purulent sputum or mucus plugs or hemoptysis or cp or chest tightness, subjective wheeze or overt sinus or hb symptoms. No unusual exposure hx or h/o childhood pna/ asthma or knowledge of premature birth.  Sleeping ok flat without nocturnal  or early am exacerbation  of respiratory  c/o's or need for noct saba. Also denies any obvious fluctuation of symptoms with weather or environmental changes or other aggravating  or alleviating factors except as outlined above   Current Allergies, Complete Past Medical History, Past Surgical History, Family History, and Social History were reviewed in [employer] record.  ROS  The following are not active complaints unless bolded Hoarseness, sore throat, dysphagia, dental problems, itching, sneezing,  nasal congestion or discharge of excess mucus or purulent secretions, ear ache,   fever, chills, sweats, unintended wt loss or wt gain, classically pleuritic or exertional cp,  orthopnea pnd or leg swelling, presyncope, palpitations, abdominal pain, anorexia, nausea, vomiting, diarrhea  or change in bowel habits or change in bladder habits, change in stools or change in urine, dysuria, hematuria,  rash, arthralgias, visual complaints, headache, numbness, weakness or ataxia or problems with walking or coordination,  change in mood/affect or memory.        Current Meds  Medication Sig  . ACCU-CHEK FASTCLIX LANCETS MISC USE TO TEST BLOOD SUGAR THREE TIMES A DAY AND EVERY NIGHT AT BEDTIME  . ACCU-CHEK SMARTVIEW test strip USE TO TEST BLOOD SUGAR THREE TIMES A DAY AND EVERY NIGHT AT BEDTIME  . aspirin EC 81 MG tablet Take 81 mg by mouth at bedtime.   Marland Kitchen atorvastatin (LIPITOR) 40 MG tablet Take 1 tablet (40  mg total) by mouth daily.  Marland Kitchen atorvastatin (LIPITOR) 40 MG tablet TAKE 1 TABLET BY MOUTH ONCE DAILY  . BIOTIN PO Take 1 tablet by mouth daily with breakfast.  . Cholecalciferol (VITAMIN D) 2000 UNITS CAPS Take 2,000 Units by mouth daily with breakfast.   . DULoxetine (CYMBALTA) 30 MG capsule TAKE 1 CAPSULE BY MOUTH EVERY DAY  . Fe Bisgly-Succ-C-Thre-B12-FA (IRON-150 PO) Take 1 tablet by mouth daily.  . hydrALAZINE (APRESOLINE) 25 MG tablet TAKE 3 TABLETS BY MOUTH THREE TIMES A DAY  . lidocaine (XYLOCAINE) 5 % ointment APPLY TOPICALLY THREE TIMES A DAY AS NEEDED  . losartan-hydrochlorothiazide (HYZAAR) 100-25 MG tablet Take 1 tablet by mouth daily.  .  metFORMIN (GLUCOPHAGE) 500 MG tablet TAKE 1 TABLET BY MOUTH TWICE DAILY WITH A MEAL  . metoprolol succinate (TOPROL-XL) 100 MG 24 hr tablet TAKE 1 TABLET BY MOUTH EVERY DAY. TAKE WITH OR IMMEDIATELY FOLLOWING A MEAL  . Multiple Vitamin (MULTI-VITAMIN DAILY PO) Take 1 tablet by mouth daily.  . nitroGLYCERIN (NITROLINGUAL) 0.4 MG/SPRAY spray Place 1 spray under the tongue every 5 (five) minutes x 3 doses as needed for chest pain.  Marland Kitchen omeprazole (PRILOSEC) 20 MG capsule TAKE 1 CAPSULE BY MOUTH EVERY DAY  . PROAIR HFA 108 (90 Base) MCG/ACT inhaler INHALE 2 PUFFS BY MOUTH EVERY 6 HOURS AS NEEDED FOR WHEEZING OR SHORTNESS OF BREATH  . traMADol (ULTRAM) 50 MG tablet TAKE 2 TABLETS BY MOUTH TWICE DAILY                        Objective:   Physical Exam  Pleasant bf / slow ambulation with 4 pronged walker     11/08/2017        142  09/26/17 139 lb (63 kg)  08/22/17 139 lb (63 kg)  07/04/17 138 lb (62.6 kg)    Vital signs reviewed - Note on arrival 02 sats  96% on RA         HEENT: nl dentition, turbinates bilaterally, and oropharynx.   external ear canals both obst by wax   NECK :  without JVD/Nodes/TM/ nl carotid upstrokes bilaterally   LUNGS: no acc muscle use,  Nl contour chest with a few insp pops/squeaks bilaterally with min exp rhonchi    CV:  RRR  no s3 or murmur or increase in P2, and no edema   ABD:  soft and nontender with  Late insp  hoover's  in the supine position. No bruits or organomegaly appreciated, bowel sounds nl  MS:  Nl gait/ ext warm without deformities, calf tenderness, cyanosis or clubbing No obvious joint restrictions   SKIN: warm and dry without lesions    NEURO:  alert, approp, nl sensorium with  no motor or cerebellar deficits apparent.         I personally reviewed images and agree with radiology impression as follows:  CXR:   08/22/17 1. No focal pulmonary nodule. The previous abnormality  likely represented a nipple shadow. 2. Stable  changes of COPD. 3. Aortic atherosclerosis. 4. Enlarged pulmonary artery c/w  pulmonary arterial hypertension.     Assessment:

## 2017-11-08 NOTE — Progress Notes (Signed)
PFT completed today 11/08/17.  

## 2017-11-12 ENCOUNTER — Encounter: Payer: Self-pay | Admitting: Internal Medicine

## 2017-11-12 DIAGNOSIS — J449 Chronic obstructive pulmonary disease, unspecified: Secondary | ICD-10-CM | POA: Insufficient documentation

## 2017-11-12 NOTE — Assessment & Plan Note (Signed)
>   3 min Discussed the risks and costs (both direct and indirect)  of smoking relative to the benefits of quitting but patient unwilling to commit at this point to a specific quit date.    Also I took an extended  opportunity with this patient to outline the consequences of continued cigarette use  in airway disorders based on all the data we have from the multiple national lung health studies (perfomed over decades at millions of dollars in cost)  indicating that smoking cessation, not choice of inhalers or physicians, is the most important aspect of her care

## 2017-11-12 NOTE — Assessment & Plan Note (Addendum)
PFT's  11/08/2017  FEV1 0.83 (46 % ) ratio 49  p 19 % improvement from saba p nothing prior to study with DLCO  54 % corrects to 84  % for alv volume    I had an extended final summary discussion with the patient reviewing all relevant studies completed to date and  lasting 15 to 20 minutes of a 25 minute visit on the following issues:    As I explained to this patient in detail:  although there is clearly significant copd  present, it may not be clinically relevant:   it does not appear to be limiting activity tolerance any more than a set of worn tires limits someone from driving a car  around a parking lot.  A new set of Michelins might look good but would have no perceived impact on the performance of the car and would not be worth the cost.  That is to say:  she is so sedentary I don't recommend aggressive pulmonary rx at this point unless limiting symptoms arise or acute exacerbations become as issue, neither of which is the case now.  I asked the patient to contact this office at any time in the future should either of these problems arise.   Each maintenance medication was reviewed in detail including most importantly the difference between maintenance and as needed and under what circumstances the prns are to be used.  Please see AVS for specific  Instructions which are unique to this visit and I personally typed out  which were reviewed in detail in writing with the patient and a copy provided.     Pulmonary f/u can be prn

## 2017-11-12 NOTE — Assessment & Plan Note (Signed)
Given that she has so much reversibility on pfts:  ,  It would be preferable to use bystolic, the most beta -1  selective Beta blocker available in sample form, with bisoprolol the most selective generic choice  on the market, at least on a trial basis, to make sure the spillover Beta 2 effects of the less specific Beta blockers are not contributing to this patient's symptoms.  For now all I rec was change lopressor from 100 mg to 50 mg bid to level off the B2 spillover at a lower level than once daily dosing may cause > Follow up per Primary Care planned

## 2017-11-23 ENCOUNTER — Telehealth: Payer: Self-pay | Admitting: Internal Medicine

## 2017-11-23 NOTE — Telephone Encounter (Signed)
Spoke with patient regarding her Medicare Wellness Visit. Received call from [facility] stating that patient needs to schedule AWV. Informed patient that her last AWV was in June 2018. Patient stated that she will be moving to [place] and will complete her wellness visit once she has established with a new provider. SF

## 2017-11-24 NOTE — Progress Notes (Signed)
In   Subjective:    Patient ID: Theresa Cline, female    DOB: [birth year], 67 y.o.   MRN: [id number]  HPI The patient is here for follow up.  CAD, Hypertension: She is taking her medication daily. She is compliant with a low sodium diet.  She has palpitations on occasion - her last episode was last week.  It lasted 30 minutes.  She had associated chest pain and took nitroglycerin and it helped.  She will see cardiology soon-she is due for a visit.  She last took nitroglycerin 5 years ago.  She is not experience any similar symptoms since last week.  She denies edema, shortness of breath and regular headaches. She is exercising regularly - uses floor bike/cycle.     Diabetes: She is taking her medication daily as prescribed. She is compliant with a diabetic diet. She is exercising regularly.  She checks her feet daily and denies foot lesions. She is up-to-date with an ophthalmology examination - has an appointment today.   GERD:  She is taking her medication daily as prescribed.  She denies any GERD symptoms and feels her GERD is well controlled.   Hyperlipidemia: She is taking her medication daily. She is compliant with a low fat/cholesterol diet. She is exercising regularly. She denies myalgias.   She will be moving to [place] this spring.  Medications and allergies reviewed with patient and updated if appropriate.  Patient Active Problem List   Diagnosis Date Noted  . COPD GOLD III still smoking 11/12/2017  . Cigarette smoker 09/26/2017  . Abnormal chest xray 08/22/2017  . History of renal cell carcinoma 02/05/2017  . Dyspnea on exertion 07/29/2016  . Lightheadedness 07/29/2016  . DM type 2, controlled, with complication ([place]) 52/77/8242  . Osteopenia 03/15/2016  . Facet arthropathy, lumbosacral 02/24/2016  . GERD (gastroesophageal reflux disease) 02/09/2016  . 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 Right knee DJD 01/18/2012  . Trochanteric bursitis of both hips 12/11/2011  .  Spinal stenosis, lumbar region, without neurogenic claudication 12/11/2011  . Syncope 07/17/2011  . EDEMA 07/12/2010  . Dizziness and giddiness 11/06/2008  . Diabetic neuropathy associated with type 2 diabetes mellitus ([place]) 10/15/2007  . Mixed hyperlipidemia 10/15/2007  . Chronic pain syndrome 10/15/2007  . Essential hypertension 10/15/2007  . Coronary atherosclerosis 10/15/2007  . Osteoarthrosis, unspecified whether generalized or localized, involving lower leg 10/15/2007    Current Outpatient Medications on File Prior to Visit  Medication Sig Dispense Refill  . ACCU-CHEK FASTCLIX LANCETS MISC USE TO TEST BLOOD SUGAR THREE TIMES A DAY AND EVERY NIGHT AT BEDTIME 102 each 2  . ACCU-CHEK SMARTVIEW test strip USE TO TEST BLOOD SUGAR THREE TIMES A DAY AND EVERY NIGHT AT BEDTIME 100 each 2  . aspirin EC 81 MG tablet Take 81 mg by mouth at bedtime.     Marland Kitchen atorvastatin (LIPITOR) 40 MG tablet TAKE 1 TABLET BY MOUTH ONCE DAILY 90 tablet 0  . BIOTIN PO Take 1 tablet by mouth daily with breakfast.    . Cholecalciferol (VITAMIN D) 2000 UNITS CAPS Take 2,000 Units by mouth daily with breakfast.     . DULoxetine (CYMBALTA) 30 MG capsule TAKE 1 CAPSULE BY MOUTH EVERY DAY 30 capsule 0  . Fe Bisgly-Succ-C-Thre-B12-FA (IRON-150 PO) Take 1 tablet by mouth daily.    . hydrALAZINE (APRESOLINE) 25 MG tablet TAKE 3 TABLETS BY MOUTH THREE TIMES A DAY 270 tablet 5  . lidocaine (XYLOCAINE) 5 % ointment APPLY TOPICALLY THREE TIMES A DAY  AS NEEDED 100 g 1  . losartan-hydrochlorothiazide (HYZAAR) 100-25 MG tablet Take 1 tablet by mouth daily.    . metFORMIN (GLUCOPHAGE) 500 MG tablet TAKE 1 TABLET BY MOUTH TWICE DAILY WITH A MEAL 180 tablet 2  . metoprolol succinate (TOPROL-XL) 100 MG 24 hr tablet TAKE 1 TABLET BY MOUTH EVERY DAY. TAKE WITH OR IMMEDIATELY FOLLOWING A MEAL (Patient taking differently: TAKE 1/2 TABLET BY MOUTH TWICE DAILY. TAKE WITH OR IMMEDIATELY FOLLOWING A MEAL) 90 tablet 1  . Multiple Vitamin  (MULTI-VITAMIN DAILY PO) Take 1 tablet by mouth daily.    . nitroGLYCERIN (NITROLINGUAL) 0.4 MG/SPRAY spray Place 1 spray under the tongue every 5 (five) minutes x 3 doses as needed for chest pain. 12 g 1  . omeprazole (PRILOSEC) 20 MG capsule TAKE 1 CAPSULE BY MOUTH EVERY DAY 90 capsule 1  . PROAIR HFA 108 (90 Base) MCG/ACT inhaler INHALE 2 PUFFS BY MOUTH EVERY 6 HOURS AS NEEDED FOR WHEEZING OR SHORTNESS OF BREATH 17 each 1  . traMADol (ULTRAM) 50 MG tablet TAKE 2 TABLETS BY MOUTH TWICE DAILY 120 tablet 0   No current facility-administered medications on file prior to visit.     Past Medical History:  Diagnosis Date  . Anemia, iron deficiency: Severe 07/29/2016  . Anxiety   . Bronchitis, acute   . CAD (coronary artery disease)   . Cancer [facility])    Renal s/p nephrectomy  . CARPAL TUNNEL RELEASE, BILATERAL, HX OF 08/09/1998   Qualifier: Diagnosis of  By: Amil Amen MD, Benjamine Mola    . Chronic pain syndrome   . Diabetes mellitus type II, controlled ([place])   . Diabetes type 2, controlled ([place])   . Diabetic peripheral neuropathy ([place])   . Encounter for long-term (current) use of other medications   . GERD (gastroesophageal reflux disease)   . Hypertension   . 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 Mixed hyperlipidemia   . Obesity   . Osteoporosis   . Primary osteoarthritis of both knees   . S/P carpal tunnel release   . Sleep apnea    does not use CPAP  . Tobacco abuse   . Vertigo     Past Surgical History:  Procedure Laterality Date  . BREAST LUMPECTOMY WITH RADIOACTIVE SEED LOCALIZATION Left 11/30/2015   Procedure: BREAST LUMPECTOMY WITH RADIOACTIVE SEED LOCALIZATION;  Surgeon: Erroll Luna, MD;  Location: [facility];  Service: General;  Laterality: Left;  . CARPAL TUNNEL RELEASE Right 08/09/1998  . CARPAL TUNNEL RELEASE Left 09/08/1998  . CESAREAN SECTION     x3  . CORONARY ANGIOPLASTY WITH STENT PLACEMENT  2004   Normal LV function  . LIPOMA EXCISION Right     Right foot  . Resection of Left Acoustic Neuroma  1999  . TOTAL ABDOMINAL HYSTERECTOMY W/ BILATERAL SALPINGOOPHORECTOMY  04/12/1988   For fibroid tumors; [place], [place]    Social History   Socioeconomic History  . Marital status: Divorced    Spouse name: None  . Number of children: None  . Years of education: None  . Highest education level: None  Social Needs  . Financial resource strain: None  . Food insecurity - worry: None  . Food insecurity - inability: None  . Transportation needs - medical: None  . Transportation needs - non-medical: None  Occupational History  . Occupation: Barrister's clerk in hotel    Comment: Disabled  . Occupation: Retail    Comment: Disabled  Tobacco Use  . Smoking  status: Former Smoker    Packs/day: 1.50    Years: 50.00    Pack years: 75.00    Types: Cigarettes    Last attempt to quit: 11/05/2017    Years since quitting: 0.0  . Smokeless tobacco: Never Used  . Tobacco comment: 09/26/17- smoking 1 ppwk//lmr  Substance and Sexual Activity  . Alcohol use: No    Alcohol/week: 0.0 oz  . Drug use: No  . Sexual activity: No  Other Topics Concern  . None  Social History Narrative   Divorced   Disability mainly for back and chronic intermittent vertigo - since tumor removed - no surgical correction for back   Lives alone, keeps her grandson, 72 yr old daughter is a mother    Family History  Problem Relation Age of Onset  . Leukemia Mother        CLL  . Diabetes type II Mother   . Other Mother        Hypercholesterolemia  . Osteoarthritis Mother   . COPD Mother   . Hypertension Mother   . Hodgkin's lymphoma Father   . Diabetes type II Sister   . Osteoporosis Sister   . Paranoid behavior Brother        Unsure if diagnosed with Schizophrenia  . Diabetes type II Sister   . Hypertension Sister   . Diabetes type II Sister   . Hypertension Sister   . Schizophrenia Sister   . Heart disease Sister        Enlarged heart  . Glaucoma Sister    . Alcohol abuse Sister   . Glaucoma Sister   . Alcohol abuse Sister   . Bipolar disorder Daughter   . Lupus Daughter        SLE  . Healthy Daughter     Review of Systems  Constitutional: Negative for chills and fever.  Respiratory: Positive for shortness of breath (only with walking long distances). Negative for cough and wheezing.   Cardiovascular: Positive for chest pain (one episode in past 5 years - took nitro) and palpitations (rare). Negative for leg swelling.  Neurological: Negative for light-headedness, numbness and headaches.       Objective:   Vitals:   11/26/17 0845  BP: (!) 144/88  Pulse: 84  Resp: 16  Temp: 98.4 F (36.9 C)  SpO2: 92%   BP Readings from Last 3 Encounters:  11/26/17 (!) 144/88  11/08/17 128/70  09/26/17 90/60    Wt Readings from Last 3 Encounters:  11/26/17 142 lb (64.4 kg)  11/08/17 142 lb (64.4 kg)  09/26/17 139 lb (63 kg)   Body mass index is 25.15 kg/m.   Physical Exam    Constitutional: Appears well-developed and well-nourished. No distress.  HENT:  Head: Normocephalic and atraumatic.  Neck: Neck supple. No tracheal deviation present. No thyromegaly present.  No cervical lymphadenopathy Cardiovascular: Normal rate, regular rhythm and normal heart sounds.   No murmur heard. No carotid bruit .  No edema Pulmonary/Chest: Effort normal and breath sounds normal. No respiratory distress. No has no wheezes. No rales.  Skin: Skin is warm and dry. Not diaphoretic.  Psychiatric: Normal mood and affect. Behavior is normal.    Diabetic Foot Exam - Simple   Simple Foot Form Diabetic Foot exam was performed with the following findings:  Yes 11/26/2017  9:08 AM  Visual Inspection Sensation Testing Intact to touch and monofilament testing bilaterally:  Yes Pulse Check Posterior Tibialis and Dorsalis pulse intact bilaterally:  Yes Comments  Minimally decreased sensation bottom of feet, minor callus formation between first and second  toes       Assessment & Plan:    See Problem List for Assessment and Plan of chronic medical problems.

## 2017-11-24 NOTE — Patient Instructions (Addendum)
  Test(s) ordered today. Your results will be released to [place] (or called to you) after review, usually within 72hours after test completion. If any changes need to be made, you will be notified at that same time.  Medications reviewed and updated.  No changes recommended at this time.   Good luck on your move.  Call us if you need anything.

## 2017-11-26 ENCOUNTER — Other Ambulatory Visit (INDEPENDENT_AMBULATORY_CARE_PROVIDER_SITE_OTHER): Payer: Medicare Other

## 2017-11-26 ENCOUNTER — Encounter: Payer: Self-pay | Admitting: Internal Medicine

## 2017-11-26 ENCOUNTER — Ambulatory Visit (INDEPENDENT_AMBULATORY_CARE_PROVIDER_SITE_OTHER): Payer: Medicare Other | Admitting: Internal Medicine

## 2017-11-26 VITALS — BP 144/88 | HR 84 | Temp 98.4°F | Resp 16 | Wt 142.0 lb

## 2017-11-26 DIAGNOSIS — E782 Mixed hyperlipidemia: Secondary | ICD-10-CM

## 2017-11-26 DIAGNOSIS — E118 Type 2 diabetes mellitus with unspecified complications: Secondary | ICD-10-CM

## 2017-11-26 DIAGNOSIS — I10 Essential (primary) hypertension: Secondary | ICD-10-CM

## 2017-11-26 DIAGNOSIS — M48061 Spinal stenosis, lumbar region without neurogenic claudication: Secondary | ICD-10-CM

## 2017-11-26 DIAGNOSIS — I2510 Atherosclerotic heart disease of native coronary artery without angina pectoris: Secondary | ICD-10-CM | POA: Diagnosis not present

## 2017-11-26 DIAGNOSIS — K219 Gastro-esophageal reflux disease without esophagitis: Secondary | ICD-10-CM

## 2017-11-26 DIAGNOSIS — H16223 Keratoconjunctivitis sicca, not specified as Sjogren's, bilateral: Secondary | ICD-10-CM | POA: Diagnosis not present

## 2017-11-26 LAB — CBC WITH DIFFERENTIAL/PLATELET
BASOS PCT: 0.5 % (ref 0.0–3.0)
Basophils Absolute: 0 10*3/uL (ref 0.0–0.1)
EOS PCT: 2.5 % (ref 0.0–5.0)
Eosinophils Absolute: 0.2 10*3/uL (ref 0.0–0.7)
HEMATOCRIT: 36.4 % (ref 36.0–46.0)
HEMOGLOBIN: 11.8 g/dL — AB (ref 12.0–15.0)
LYMPHS PCT: 24.6 % (ref 12.0–46.0)
Lymphs Abs: 1.9 10*3/uL (ref 0.7–4.0)
MCHC: 32.6 g/dL (ref 30.0–36.0)
MCV: 88.9 fl (ref 78.0–100.0)
MONOS PCT: 10 % (ref 3.0–12.0)
Monocytes Absolute: 0.8 10*3/uL (ref 0.1–1.0)
Neutro Abs: 4.9 10*3/uL (ref 1.4–7.7)
Neutrophils Relative %: 62.4 % (ref 43.0–77.0)
Platelets: 208 10*3/uL (ref 150.0–400.0)
RBC: 4.09 Mil/uL (ref 3.87–5.11)
RDW: 13.8 % (ref 11.5–15.5)
WBC: 7.8 10*3/uL (ref 4.0–10.5)

## 2017-11-26 LAB — LIPID PANEL
CHOLESTEROL: 125 mg/dL (ref 0–200)
HDL: 50.8 mg/dL (ref >39.00)
LDL Cholesterol: 60 mg/dL (ref 0–99)
NONHDL: 74.32
Total CHOL/HDL Ratio: 2
Triglycerides: 72 mg/dL (ref 0.0–149.0)
VLDL: 14.4 mg/dL (ref 0.0–40.0)

## 2017-11-26 LAB — COMPREHENSIVE METABOLIC PANEL
ALBUMIN: 4 g/dL (ref 3.5–5.2)
ALK PHOS: 57 U/L (ref 39–117)
ALT: 9 U/L (ref 0–35)
AST: 16 U/L (ref 0–37)
BUN: 24 mg/dL — AB (ref 6–23)
CALCIUM: 9.9 mg/dL (ref 8.4–10.5)
CHLORIDE: 97 meq/L (ref 96–112)
CO2: 33 mEq/L — ABNORMAL HIGH (ref 19–32)
Creatinine, Ser: 0.98 mg/dL (ref 0.40–1.20)
GFR: 72.36 mL/min (ref >60.00)
Glucose, Bld: 96 mg/dL (ref 70–99)
POTASSIUM: 3.3 meq/L — AB (ref 3.5–5.1)
Sodium: 138 mEq/L (ref 135–145)
TOTAL PROTEIN: 7.2 g/dL (ref 6.0–8.3)
Total Bilirubin: 0.3 mg/dL (ref 0.2–1.2)

## 2017-11-26 LAB — HEMOGLOBIN A1C: Hgb A1c MFr Bld: 6.2 % (ref 4.6–6.5)

## 2017-11-26 NOTE — Assessment & Plan Note (Signed)
BP well controlled Current regimen effective and well tolerated Continue current medications at current doses cmp  

## 2017-11-26 NOTE — Assessment & Plan Note (Signed)
Check lipid panel  Continue daily statin Regular exercise and healthy diet encouraged  

## 2017-11-26 NOTE — Assessment & Plan Note (Signed)
GERD controlled Continue daily medication  

## 2017-11-26 NOTE — Assessment & Plan Note (Signed)
Taking Cymbalta and tramadol Follows with pain management-management per them

## 2017-11-26 NOTE — Assessment & Plan Note (Signed)
Check a1c Low sugar / carb diet Stressed regular exercise   

## 2017-11-26 NOTE — Assessment & Plan Note (Signed)
Last week she did have an episode of palpitations associated with chest pain.  She took one nitroglycerin and this relieved her symptoms.  She has not had any recurrent symptoms She last took nitroglycerin 5 years ago She is due for follow-up with cardiology and will schedule an appointment She has another episode she needs to call cardiology immediately

## 2017-11-28 ENCOUNTER — Other Ambulatory Visit: Payer: Self-pay | Admitting: Physical Medicine & Rehabilitation

## 2017-11-29 ENCOUNTER — Telehealth: Payer: Self-pay | Admitting: Internal Medicine

## 2017-11-29 DIAGNOSIS — G8929 Other chronic pain: Secondary | ICD-10-CM

## 2017-11-29 DIAGNOSIS — M549 Dorsalgia, unspecified: Principal | ICD-10-CM

## 2017-11-29 NOTE — Telephone Encounter (Signed)
Copied from [place] [id number]. Topic: Referral - Request >> Nov 29, 2017  3:31 PM Cleaster Corin, [place] wrote: Reason for CRM: pt. Calling to request a referral for pain management to be sent to [facility]. Pt. Can be reached at  [phone]

## 2017-11-30 NOTE — Telephone Encounter (Signed)
Yes I will order - need to confirm reason - ? Pain in back

## 2017-12-04 NOTE — Telephone Encounter (Signed)
Spoke with pt, referral is for back pain.

## 2017-12-04 NOTE — Telephone Encounter (Signed)
ordered

## 2017-12-06 DIAGNOSIS — I788 Other diseases of capillaries: Secondary | ICD-10-CM | POA: Diagnosis not present

## 2017-12-06 DIAGNOSIS — L810 Postinflammatory hyperpigmentation: Secondary | ICD-10-CM | POA: Diagnosis not present

## 2017-12-06 DIAGNOSIS — L821 Other seborrheic keratosis: Secondary | ICD-10-CM | POA: Diagnosis not present

## 2017-12-07 ENCOUNTER — Ambulatory Visit: Payer: Medicare Other | Admitting: Podiatry

## 2017-12-20 ENCOUNTER — Encounter: Payer: Medicare Other | Attending: Physical Medicine & Rehabilitation

## 2017-12-20 ENCOUNTER — Encounter: Payer: Self-pay | Admitting: Physical Medicine & Rehabilitation

## 2017-12-20 ENCOUNTER — Ambulatory Visit (HOSPITAL_BASED_OUTPATIENT_CLINIC_OR_DEPARTMENT_OTHER): Payer: Medicare Other | Admitting: Physical Medicine & Rehabilitation

## 2017-12-20 ENCOUNTER — Other Ambulatory Visit: Payer: Self-pay | Admitting: Internal Medicine

## 2017-12-20 VITALS — BP 142/95 | HR 61 | Resp 14

## 2017-12-20 DIAGNOSIS — I10 Essential (primary) hypertension: Secondary | ICD-10-CM | POA: Insufficient documentation

## 2017-12-20 DIAGNOSIS — M48061 Spinal stenosis, lumbar region without neurogenic claudication: Secondary | ICD-10-CM | POA: Diagnosis not present

## 2017-12-20 DIAGNOSIS — G894 Chronic pain syndrome: Secondary | ICD-10-CM | POA: Diagnosis not present

## 2017-12-20 DIAGNOSIS — Z9889 Other specified postprocedural states: Secondary | ICD-10-CM | POA: Diagnosis not present

## 2017-12-20 DIAGNOSIS — K219 Gastro-esophageal reflux disease without esophagitis: Secondary | ICD-10-CM | POA: Diagnosis not present

## 2017-12-20 DIAGNOSIS — Z811 Family history of alcohol abuse and dependence: Secondary | ICD-10-CM | POA: Diagnosis not present

## 2017-12-20 DIAGNOSIS — Z833 Family history of diabetes mellitus: Secondary | ICD-10-CM | POA: Diagnosis not present

## 2017-12-20 DIAGNOSIS — Z806 Family history of leukemia: Secondary | ICD-10-CM | POA: Diagnosis not present

## 2017-12-20 DIAGNOSIS — Z79891 Long term (current) use of opiate analgesic: Secondary | ICD-10-CM | POA: Insufficient documentation

## 2017-12-20 DIAGNOSIS — Z87891 Personal history of nicotine dependence: Secondary | ICD-10-CM | POA: Insufficient documentation

## 2017-12-20 DIAGNOSIS — E119 Type 2 diabetes mellitus without complications: Secondary | ICD-10-CM | POA: Insufficient documentation

## 2017-12-20 DIAGNOSIS — Z818 Family history of other mental and behavioral disorders: Secondary | ICD-10-CM | POA: Insufficient documentation

## 2017-12-20 DIAGNOSIS — Z5181 Encounter for therapeutic drug level monitoring: Secondary | ICD-10-CM | POA: Diagnosis not present

## 2017-12-20 DIAGNOSIS — Z8249 Family history of ischemic heart disease and other diseases of the circulatory system: Secondary | ICD-10-CM | POA: Insufficient documentation

## 2017-12-20 DIAGNOSIS — Z825 Family history of asthma and other chronic lower respiratory diseases: Secondary | ICD-10-CM | POA: Diagnosis not present

## 2017-12-20 DIAGNOSIS — I2510 Atherosclerotic heart disease of native coronary artery without angina pectoris: Secondary | ICD-10-CM | POA: Insufficient documentation

## 2017-12-20 MED ORDER — TRAMADOL HCL 50 MG PO TABS: 100.0000 mg | ORAL_TABLET | Freq: Two times a day (BID) | ORAL | 5 refills | Status: DC

## 2017-12-20 NOTE — Progress Notes (Signed)
Subjective:    Patient ID: Theresa Cline, female    DOB: 06/26/1951, 67 y.o.   MRN: [id number]  HPI 67 yo pt with lumbar spinal stenosis who has been on chronic analgesics, tramadol. Pt was supposed to move to [place] to stay with her daughter but this has been delayed due to her grandson's  Illness.  CC lower back pain , no radiation into legs  Pt feels that tramadol is providing good pain relief there is no adverse reaction to this medication.  Denies any nausea vomiting or constipation  Pain Inventory Average Pain 6 Pain Right Now 6 My pain is constant, burning and stabbing  In the last 24 hours, has pain interfered with the following? General activity 6 Relation with others 5 Enjoyment of life 9 What TIME of day is your pain at its worst? all Sleep (in general) Poor  Pain is worse with: bending and some activites Pain improves with: therapy/exercise and medication Relief from Meds: 8  Mobility walk with assistance use a cane ability to climb steps?  no do you drive?  no  Function retired  Neuro/Psych weakness trouble walking dizziness confusion anxiety loss of taste or smell  Prior Studies Any changes since last visit?  no  Physicians involved in your care Any changes since last visit?  no   Family History  Problem Relation Age of Onset  . Leukemia Mother        CLL  . Diabetes type II Mother   . Other Mother        Hypercholesterolemia  . Osteoarthritis Mother   . COPD Mother   . Hypertension Mother   . Hodgkin's lymphoma Father   . Diabetes type II Sister   . Osteoporosis Sister   . Paranoid behavior Brother        Unsure if diagnosed with Schizophrenia  . Diabetes type II Sister   . Hypertension Sister   . Diabetes type II Sister   . Hypertension Sister   . Schizophrenia Sister   . Heart disease Sister        Enlarged heart  . Glaucoma Sister   . Alcohol abuse Sister   . Glaucoma Sister   . Alcohol abuse Sister   . Bipolar  disorder Daughter   . Lupus Daughter        SLE  . Healthy Daughter    Social History   Socioeconomic History  . Marital status: Divorced    Spouse name: None  . Number of children: None  . Years of education: None  . Highest education level: None  Social Needs  . Financial resource strain: None  . Food insecurity - worry: None  . Food insecurity - inability: None  . Transportation needs - medical: None  . Transportation needs - non-medical: None  Occupational History  . Occupation: Barrister's clerk in hotel    Comment: Disabled  . Occupation: Retail    Comment: Disabled  Tobacco Use  . Smoking status: Former Smoker    Packs/day: 1.50    Years: 50.00    Pack years: 75.00    Types: Cigarettes    Last attempt to quit: 11/05/2017    Years since quitting: 0.1  . Smokeless tobacco: Never Used  . Tobacco comment: 09/26/17- smoking 1 ppwk//lmr  Substance and Sexual Activity  . Alcohol use: No    Alcohol/week: 0.0 oz  . Drug use: No  . Sexual activity: No  Other Topics Concern  . None  Social History  Narrative   Divorced   Disability mainly for back and chronic intermittent vertigo - since tumor removed - no surgical correction for back   Lives alone, keeps her grandson, 76 yr old daughter is a mother   Past Surgical History:  Procedure Laterality Date  . BREAST LUMPECTOMY WITH RADIOACTIVE SEED LOCALIZATION Left 11/30/2015   Procedure: BREAST LUMPECTOMY WITH RADIOACTIVE SEED LOCALIZATION;  Surgeon: Erroll Luna, MD;  Location: [place];  Service: General;  Laterality: Left;  . CARPAL TUNNEL RELEASE Right 08/09/1998  . CARPAL TUNNEL RELEASE Left 09/08/1998  . CESAREAN SECTION     x3  . CORONARY ANGIOPLASTY WITH STENT PLACEMENT  2004   Normal LV function  . LIPOMA EXCISION Right    Right foot  . Resection of Left Acoustic Neuroma  1999  . TOTAL ABDOMINAL HYSTERECTOMY W/ BILATERAL SALPINGOOPHORECTOMY  04/12/1988   For fibroid tumors; [place], [place]    Past Medical History:  Diagnosis Date  . Anemia, iron deficiency: Severe 07/29/2016  . Anxiety   . Bronchitis, acute   . CAD (coronary artery disease)   . Cancer [facility])    Renal s/p nephrectomy  . CARPAL TUNNEL RELEASE, BILATERAL, HX OF 08/09/1998   Qualifier: Diagnosis of  By: Amil Amen MD, Benjamine Mola    . Chronic pain syndrome   . Diabetes mellitus type II, controlled ([place])   . Diabetes type 2, controlled ([place])   . Diabetic peripheral neuropathy ([place])   . Encounter for long-term (current) use of other medications   . GERD (gastroesophageal reflux disease)   . Hypertension   . 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 Mixed hyperlipidemia   . Obesity   . Osteoporosis   . Primary osteoarthritis of both knees   . S/P carpal tunnel release   . Sleep apnea    does not use CPAP  . Tobacco abuse   . Vertigo    BP (!) 142/95 (BP Location: Right Arm, Patient Position: Sitting, Cuff Size: Normal)   Opioid Risk Score:   Fall Risk Score:  `1  Depression screen PHQ 2/9  Depression screen [facility] 2/9 07/04/2017 03/21/2017 07/28/2016 05/06/2015 09/25/2014  Decreased Interest 0 0 0 0 0  Down, Depressed, Hopeless 0 0 0 0 0  PHQ - 2 Score 0 0 0 0 0  Altered sleeping - - - 3 -  Tired, decreased energy - - - 0 -  Change in appetite - - - 3 -  Feeling bad or failure about yourself  - - - 0 -  Trouble concentrating - - - 2 -  Moving slowly or fidgety/restless - - - 0 -  Suicidal thoughts - - - 0 -  PHQ-9 Score - - - 8 -  Some recent data might be hidden    Review of Systems  HENT: Negative.   Eyes: Negative.   Respiratory: Positive for apnea, cough, shortness of breath and wheezing.   Gastrointestinal: Positive for nausea.  Endocrine: Negative.   Genitourinary: Negative.   Musculoskeletal: Positive for arthralgias, back pain and gait problem.  Skin: Negative.   Allergic/Immunologic: Negative.   Neurological: Positive for dizziness and weakness.  Psychiatric/Behavioral: Positive for  confusion. The patient is nervous/anxious.        Objective:   Physical Exam  General no acute distress Mood and affect are appropriate Motor strength is 5/5 bilateral hip flexor knee extensor ankle dorsiflexor Ambulates with a cane no evidence of toe drag or knee instability Back has  limited range of motion decreased to 25% extension 25% lateral bending and 50% flexion at the lumbar spine. Lumbar spine has tenderness along the L4 L5-S1 paraspinal region.       Assessment & Plan:   #1.  Lumbar spinal stenosis without evidence of neurogenic claudication or radiculopathy.  Indication for chronic opioid: lumbar spinal stenosis Medication and dose: Tramadol 50mg  2 po BID # pills per month: 120 Last UDS date: 12/20/17 Pain contract signed (Y/N): Y Date narcotic database last reviewed (include red flags): 12/20/2017

## 2017-12-20 NOTE — Patient Instructions (Signed)
Please cancel appt if you move

## 2017-12-26 ENCOUNTER — Other Ambulatory Visit: Payer: Self-pay | Admitting: Internal Medicine

## 2017-12-27 LAB — TOXASSURE SELECT,+ANTIDEPR,UR

## 2017-12-31 ENCOUNTER — Telehealth: Payer: Self-pay | Admitting: *Deleted

## 2017-12-31 NOTE — Telephone Encounter (Signed)
Urine drug screen for this encounter is consistent for prescribed medication 

## 2018-01-02 ENCOUNTER — Ambulatory Visit (INDEPENDENT_AMBULATORY_CARE_PROVIDER_SITE_OTHER): Payer: Medicare Other | Admitting: Podiatry

## 2018-01-02 ENCOUNTER — Encounter: Payer: Self-pay | Admitting: Podiatry

## 2018-01-02 VITALS — BP 170/75 | HR 67

## 2018-01-02 DIAGNOSIS — M2041 Other hammer toe(s) (acquired), right foot: Secondary | ICD-10-CM

## 2018-01-02 DIAGNOSIS — R2681 Unsteadiness on feet: Secondary | ICD-10-CM

## 2018-01-02 DIAGNOSIS — E1140 Type 2 diabetes mellitus with diabetic neuropathy, unspecified: Secondary | ICD-10-CM

## 2018-01-02 DIAGNOSIS — L84 Corns and callosities: Secondary | ICD-10-CM

## 2018-01-02 DIAGNOSIS — E1149 Type 2 diabetes mellitus with other diabetic neurological complication: Secondary | ICD-10-CM

## 2018-01-02 DIAGNOSIS — W19XXXA Unspecified fall, initial encounter: Secondary | ICD-10-CM

## 2018-01-03 ENCOUNTER — Encounter: Payer: Self-pay | Admitting: Podiatry

## 2018-01-03 NOTE — Progress Notes (Signed)
This patient presents the office with chief complaint of painful callus on the bottom of her second toe right foot.  She says that this callus becomes painful, which requires her to trim the callus herself.  This patient is diabetic and she  is concerned about her toe.  She also says she has a tendency to fall and she is concerned about her feet.  She believes she may be needing a pair of diabetic footgear.  She presents the office today for an evaluation and treatment of her diabetic feet.   General Appearance  Alert, conversant and in no acute stress.  Vascular  Dorsalis pedis and posterior tibial  pulses are palpable  bilaterally.  Capillary return is within normal limits  bilaterally. Temperature is within normal limits  bilaterally.  Neurologic  Senn-Weinstein monofilament wire test within normal limits  bilaterally. Muscle power within normal limits bilaterally.  Nails Thick disfigured discolored nails with subungual debris  from hallux to fifth toes bilaterally. No evidence of bacterial infection or drainage bilaterally.  Orthopedic  No limitations of motion of motion feet .  No crepitus or effusions noted.  HAV  B/L.  Contracture second toes both feet with right greater than left.  Skin  normotropic skin with no porokeratosis noted bilaterally.  No signs of infections or ulcers noted.  There is callus noted under middle phalanx second toe right foot.  Callys secondary contracted digit second toe right.  Patient says she has tendency to fall.  Therefore I recommended to be evaluated by the pedorthist for her history of falls.   Gardiner Barefoot DPM

## 2018-01-10 ENCOUNTER — Ambulatory Visit: Payer: Medicare Other | Admitting: Orthotics

## 2018-01-10 DIAGNOSIS — E1140 Type 2 diabetes mellitus with diabetic neuropathy, unspecified: Secondary | ICD-10-CM

## 2018-01-10 DIAGNOSIS — E1149 Type 2 diabetes mellitus with other diabetic neurological complication: Secondary | ICD-10-CM

## 2018-01-10 DIAGNOSIS — W19XXXA Unspecified fall, initial encounter: Secondary | ICD-10-CM

## 2018-01-10 DIAGNOSIS — R2681 Unsteadiness on feet: Secondary | ICD-10-CM

## 2018-01-10 NOTE — Progress Notes (Signed)
Patient to seen today for evaluation and scanning for [facility]. Patient has hx of falling, in 2017 resulting in injury to hand; patient has fear of falling and is diabetic w/ diabetic neuropathy;  Patient reports issues with proprioception.  She is on medication that has a common side effect of dizziness; and a history of HTN  Gait analysis reveals unsteadiness on feet after making a turn around and the Timed Up and Go test reveals an increased risk of falling.   Casted for [employer] size [phone]

## 2018-01-18 ENCOUNTER — Telehealth: Payer: Self-pay | Admitting: Internal Medicine

## 2018-01-18 MED ORDER — ACCU-CHEK FASTCLIX LANCETS MISC: 2 refills | Status: DC

## 2018-01-18 MED ORDER — GLUCOSE BLOOD VI STRP: ORAL_STRIP | 2 refills | Status: DC

## 2018-01-18 NOTE — Telephone Encounter (Signed)
Copied from [place] [id number]. Topic: Quick Communication - Rx Refill/Question >> Jan 18, 2018  9:39 AM Carolyn Stare wrote: Medication   ACCU-CHEK FASTCLIX LANCETS MISC   ACCU-CHEK SMARTVIEW test strip   Has the patient contacted their pharmacy yes    Preferred Pharmacy  [email or web address]   Agent: Please be advised that RX refills may take up to 3 business days. We ask that you follow-up with your pharmacy.

## 2018-01-29 ENCOUNTER — Ambulatory Visit (INDEPENDENT_AMBULATORY_CARE_PROVIDER_SITE_OTHER): Payer: Medicare Other | Admitting: Orthotics

## 2018-01-29 DIAGNOSIS — E1149 Type 2 diabetes mellitus with other diabetic neurological complication: Secondary | ICD-10-CM

## 2018-01-29 DIAGNOSIS — R2681 Unsteadiness on feet: Secondary | ICD-10-CM | POA: Diagnosis not present

## 2018-01-29 DIAGNOSIS — W19XXXA Unspecified fall, initial encounter: Secondary | ICD-10-CM

## 2018-01-29 DIAGNOSIS — E1140 Type 2 diabetes mellitus with diabetic neuropathy, unspecified: Secondary | ICD-10-CM | POA: Diagnosis not present

## 2018-01-29 DIAGNOSIS — M2041 Other hammer toe(s) (acquired), right foot: Secondary | ICD-10-CM

## 2018-01-29 NOTE — Progress Notes (Signed)
Patient came in today to pick up [name] (Bilateral).   Patient was able to don brace independently and brace was check for fit to custom.   Patient was observed walking with brace and gait was improved as well as stability.   Patient was advised of care and wearing instructions.  Advised to notify practice if there were any issues; especially skin irritation.  

## 2018-02-04 ENCOUNTER — Other Ambulatory Visit: Payer: Medicare Other | Admitting: Orthotics

## 2018-03-14 ENCOUNTER — Other Ambulatory Visit: Payer: Self-pay | Admitting: Internal Medicine

## 2018-04-03 ENCOUNTER — Ambulatory Visit: Payer: Medicare Other | Admitting: Podiatry

## 2018-04-10 ENCOUNTER — Other Ambulatory Visit: Payer: Self-pay | Admitting: Internal Medicine

## 2018-05-09 ENCOUNTER — Other Ambulatory Visit: Payer: Self-pay | Admitting: Internal Medicine

## 2018-05-22 ENCOUNTER — Ambulatory Visit: Payer: Medicare Other | Admitting: Podiatry

## 2018-05-31 ENCOUNTER — Ambulatory Visit (INDEPENDENT_AMBULATORY_CARE_PROVIDER_SITE_OTHER): Payer: Medicare Other | Admitting: Podiatry

## 2018-05-31 ENCOUNTER — Encounter: Payer: Self-pay | Admitting: Podiatry

## 2018-05-31 DIAGNOSIS — E1149 Type 2 diabetes mellitus with other diabetic neurological complication: Secondary | ICD-10-CM

## 2018-05-31 DIAGNOSIS — L84 Corns and callosities: Secondary | ICD-10-CM

## 2018-05-31 DIAGNOSIS — E1140 Type 2 diabetes mellitus with diabetic neuropathy, unspecified: Secondary | ICD-10-CM | POA: Diagnosis not present

## 2018-05-31 DIAGNOSIS — M2041 Other hammer toe(s) (acquired), right foot: Secondary | ICD-10-CM | POA: Diagnosis not present

## 2018-05-31 NOTE — Progress Notes (Signed)
Complaint:  Visit Type: Patient returns to my office for continued preventative foot care services. Complaint: Patient states" my calluses both feet are painful walking and wearing her shoes.  Patient has been diagnosed with DM with no foot complications. The patient presents for preventative foot care services. No changes to ROS  Podiatric Exam: Vascular: dorsalis pedis and posterior tibial pulses are palpable bilateral. Capillary return is immediate. Temperature gradient is WNL. Skin turgor WNL  Sensorium: Normal Semmes Weinstein monofilament test. Normal tactile sensation bilaterally. Nail Exam: Normal nails noted with no evidence of bacterial or fungal infection. Ulcer Exam: There is no evidence of ulcer or pre-ulcerative changes or infection. Orthopedic Exam: Muscle tone and strength are WNL. No limitations in general ROM. No crepitus or effusions noted. Hammer toe second right.  .   Skin: No Porokeratosis. 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  Treatment & Plan Procedures and Treatment: Consent by patient was obtained for treatment procedures.   Debridement of mycotic and hypertrophic toenails, 1 through 5 bilateral and clearing of subungual debris. No ulceration, no infection noted.  Return Visit-Office Procedure: Patient instructed to return to the office for a follow up visit 6 months for continued evaluation and treatment.    Gardiner Barefoot DPM

## 2018-06-11 ENCOUNTER — Other Ambulatory Visit: Payer: Self-pay | Admitting: Physical Medicine & Rehabilitation

## 2018-06-20 ENCOUNTER — Telehealth: Payer: Self-pay | Admitting: Internal Medicine

## 2018-06-20 ENCOUNTER — Ambulatory Visit: Payer: Medicare Other | Admitting: Physical Medicine & Rehabilitation

## 2018-06-20 ENCOUNTER — Ambulatory Visit: Payer: Medicare Other

## 2018-06-20 DIAGNOSIS — Z1231 Encounter for screening mammogram for malignant neoplasm of breast: Secondary | ICD-10-CM

## 2018-06-20 NOTE — Telephone Encounter (Signed)
Copied from [place] [id number]. Topic: Referral - Request >> Jun 20, 2018 12:15 PM Theresa Cline wrote: Reason for CRM:   Pt calling because she would like to schedule a mammogram.  Pt doesn't have a preference of where this is done and isn't sure if she needs a referral to go about this. Pt can be reached at [phone].

## 2018-06-20 NOTE — Telephone Encounter (Signed)
Not sure if she needs a referral -- I did order the test -- she can call to schedule as well.       [facility]  Schedule an appointment by calling [phone]

## 2018-06-20 NOTE — Telephone Encounter (Signed)
LVM informing patient.

## 2018-06-21 ENCOUNTER — Encounter: Payer: Self-pay | Admitting: Physical Medicine & Rehabilitation

## 2018-06-21 ENCOUNTER — Ambulatory Visit (HOSPITAL_BASED_OUTPATIENT_CLINIC_OR_DEPARTMENT_OTHER): Payer: Medicare Other | Admitting: Physical Medicine & Rehabilitation

## 2018-06-21 ENCOUNTER — Encounter: Payer: Medicare Other | Attending: Physical Medicine & Rehabilitation

## 2018-06-21 ENCOUNTER — Other Ambulatory Visit: Payer: Self-pay

## 2018-06-21 VITALS — BP 139/79 | HR 69 | Ht 63.0 in | Wt 132.5 lb

## 2018-06-21 DIAGNOSIS — Z79891 Long term (current) use of opiate analgesic: Secondary | ICD-10-CM | POA: Insufficient documentation

## 2018-06-21 DIAGNOSIS — I10 Essential (primary) hypertension: Secondary | ICD-10-CM | POA: Insufficient documentation

## 2018-06-21 DIAGNOSIS — F419 Anxiety disorder, unspecified: Secondary | ICD-10-CM | POA: Diagnosis not present

## 2018-06-21 DIAGNOSIS — Z5181 Encounter for therapeutic drug level monitoring: Secondary | ICD-10-CM

## 2018-06-21 DIAGNOSIS — G894 Chronic pain syndrome: Secondary | ICD-10-CM | POA: Diagnosis not present

## 2018-06-21 DIAGNOSIS — E669 Obesity, unspecified: Secondary | ICD-10-CM | POA: Diagnosis not present

## 2018-06-21 DIAGNOSIS — M48061 Spinal stenosis, lumbar region without neurogenic claudication: Secondary | ICD-10-CM | POA: Insufficient documentation

## 2018-06-21 DIAGNOSIS — E1140 Type 2 diabetes mellitus with diabetic neuropathy, unspecified: Secondary | ICD-10-CM | POA: Insufficient documentation

## 2018-06-21 DIAGNOSIS — M810 Age-related osteoporosis without current pathological fracture: Secondary | ICD-10-CM | POA: Insufficient documentation

## 2018-06-21 DIAGNOSIS — M170 Bilateral primary osteoarthritis of knee: Secondary | ICD-10-CM | POA: Insufficient documentation

## 2018-06-21 DIAGNOSIS — Z6823 Body mass index (BMI) 23.0-23.9, adult: Secondary | ICD-10-CM | POA: Diagnosis not present

## 2018-06-21 DIAGNOSIS — G4730 Sleep apnea, unspecified: Secondary | ICD-10-CM | POA: Diagnosis not present

## 2018-06-21 DIAGNOSIS — E782 Mixed hyperlipidemia: Secondary | ICD-10-CM | POA: Insufficient documentation

## 2018-06-21 DIAGNOSIS — K219 Gastro-esophageal reflux disease without esophagitis: Secondary | ICD-10-CM | POA: Diagnosis not present

## 2018-06-21 DIAGNOSIS — I2510 Atherosclerotic heart disease of native coronary artery without angina pectoris: Secondary | ICD-10-CM | POA: Diagnosis not present

## 2018-06-21 DIAGNOSIS — Z87891 Personal history of nicotine dependence: Secondary | ICD-10-CM | POA: Diagnosis not present

## 2018-06-21 MED ORDER — TRAMADOL HCL 50 MG PO TABS: 100.0000 mg | ORAL_TABLET | Freq: Two times a day (BID) | ORAL | 0 refills | Status: DC

## 2018-06-21 NOTE — Patient Instructions (Signed)
Tramadol try decreasingto 1 tablet twice a day. Please call to tell us how you are doing

## 2018-06-21 NOTE — Progress Notes (Signed)
Subjective:    Patient ID: Theresa Cline, female    DOB: [birth year], 67 y.o.   MRN: [id number]  HPI  67 year old female with history of lumbar spinal stenosis.  She walks with a cane.  She has diabetic neuropathy.  She has a prior history of renal carcinoma noted incidentally on a lumbar MRI she is status post nephrectomy Pain is doing better!!  Walking 92min BID! Lost some weight  No pain  today,   Pain Inventory Average Pain 0 Pain Right Now 0 My pain is no pain today  In the last 24 hours, has pain interfered with the following? General activity 0 Relation with others 0 Enjoyment of life 0 What TIME of day is your pain at its worst? no pain Sleep (in general) NA  Pain is worse with: some activites Pain improves with: medication Relief from Meds: 10  Mobility use a cane how many minutes can you walk? 60 ability to climb steps?  no do you drive?  no  Function retired  Neuro/Psych numbness trouble walking dizziness loss of taste or smell  Prior Studies Any changes since last visit?  no  Physicians involved in your care Any changes since last visit?  no   Family History  Problem Relation Age of Onset  . Leukemia Mother        CLL  . Diabetes type II Mother   . Other Mother        Hypercholesterolemia  . Osteoarthritis Mother   . COPD Mother   . Hypertension Mother   . Hodgkin's lymphoma Father   . Diabetes type II Sister   . Osteoporosis Sister   . Paranoid behavior Brother        Unsure if diagnosed with Schizophrenia  . Diabetes type II Sister   . Hypertension Sister   . Diabetes type II Sister   . Hypertension Sister   . Schizophrenia Sister   . Heart disease Sister        Enlarged heart  . Glaucoma Sister   . Alcohol abuse Sister   . Glaucoma Sister   . Alcohol abuse Sister   . Bipolar disorder Daughter   . Lupus Daughter        SLE  . Healthy Daughter    Social History   Socioeconomic History  . Marital status: Divorced      Spouse name: Not on file  . Number of children: Not on file  . Years of education: Not on file  . Highest education level: Not on file  Occupational History  . Occupation: Barrister's clerk in hotel    Comment: Disabled  . Occupation: Retail    Comment: Disabled  Social Needs  . Financial resource strain: Not on file  . Food insecurity:    Worry: Not on file    Inability: Not on file  . Transportation needs:    Medical: Not on file    Non-medical: Not on file  Tobacco Use  . Smoking status: Former Smoker    Packs/day: 1.50    Years: 50.00    Pack years: 75.00    Types: Cigarettes    Last attempt to quit: 11/05/2017    Years since quitting: 0.6  . Smokeless tobacco: Never Used  . Tobacco comment: 09/26/17- smoking 1 ppwk//lmr  Substance and Sexual Activity  . Alcohol use: No    Alcohol/week: 0.0 standard drinks  . Drug use: No  . Sexual activity: Never  Lifestyle  . Physical activity:  Days per week: Not on file    Minutes per session: Not on file  . Stress: Not on file  Relationships  . Social connections:    Talks on phone: Not on file    Gets together: Not on file    Attends religious service: Not on file    Active member of club or organization: Not on file    Attends meetings of clubs or organizations: Not on file    Relationship status: Not on file  Other Topics Concern  . Not on file  Social History Narrative   Divorced   Disability mainly for back and chronic intermittent vertigo - since tumor removed - no surgical correction for back   Lives alone, keeps her grandson, 66 yr old daughter is a mother   Past Surgical History:  Procedure Laterality Date  . BREAST LUMPECTOMY WITH RADIOACTIVE SEED LOCALIZATION Left 11/30/2015   Procedure: BREAST LUMPECTOMY WITH RADIOACTIVE SEED LOCALIZATION;  Surgeon: Erroll Luna, MD;  Location: [place];  Service: General;  Laterality: Left;  . CARPAL TUNNEL RELEASE Right 08/09/1998  . CARPAL TUNNEL  RELEASE Left 09/08/1998  . CESAREAN SECTION     x3  . CORONARY ANGIOPLASTY WITH STENT PLACEMENT  2004   Normal LV function  . LIPOMA EXCISION Right    Right foot  . Resection of Left Acoustic Neuroma  1999  . TOTAL ABDOMINAL HYSTERECTOMY W/ BILATERAL SALPINGOOPHORECTOMY  04/12/1988   For fibroid tumors; [place], [place]   Past Medical History:  Diagnosis Date  . Anemia, iron deficiency: Severe 07/29/2016  . Anxiety   . Bronchitis, acute   . CAD (coronary artery disease)   . Cancer [facility])    Renal s/p nephrectomy  . CARPAL TUNNEL RELEASE, BILATERAL, HX OF 08/09/1998   Qualifier: Diagnosis of  By: Amil Amen MD, Benjamine Mola    . Chronic pain syndrome   . Diabetes mellitus type II, controlled ([place])   . Diabetes type 2, controlled ([place])   . Diabetic peripheral neuropathy ([place])   . Encounter for long-term (current) use of other medications   . GERD (gastroesophageal reflux disease)   . Hypertension   . 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 Mixed hyperlipidemia   . Obesity   . Osteoporosis   . Primary osteoarthritis of both knees   . S/P carpal tunnel release   . Sleep apnea    does not use CPAP  . Tobacco abuse   . Vertigo    BP 139/79   Pulse 69   Ht 5\' 3"  (1.6 m)   Wt 132 lb 8 oz (60.1 kg)   SpO2 90%   BMI 23.47 kg/m   Opioid Risk Score:   Fall Risk Score:  `1  Depression screen PHQ 2/9  Depression screen [facility] 2/9 06/21/2018 07/04/2017 03/21/2017 07/28/2016 05/06/2015 09/25/2014  Decreased Interest 0 0 0 0 0 0  Down, Depressed, Hopeless 0 0 0 0 0 0  PHQ - 2 Score 0 0 0 0 0 0  Altered sleeping - - - - 3 -  Tired, decreased energy - - - - 0 -  Change in appetite - - - - 3 -  Feeling bad or failure about yourself  - - - - 0 -  Trouble concentrating - - - - 2 -  Moving slowly or fidgety/restless - - - - 0 -  Suicidal thoughts - - - - 0 -  PHQ-9 Score - - - - 8 -  Some recent data might be hidden    Review of Systems  Constitutional: Positive for diaphoresis and  unexpected weight change.  HENT: Negative.   Eyes: Negative.   Respiratory: Positive for apnea.   Cardiovascular: Positive for leg swelling.  Gastrointestinal: Negative.   Endocrine: Negative.   Genitourinary: Negative.   Musculoskeletal: Positive for gait problem.  Skin: Negative.   Neurological: Positive for dizziness and numbness.  Hematological: Negative.   Psychiatric/Behavioral: Negative.   All other systems reviewed and are negative.      Objective:   Physical Exam  Constitutional: She is oriented to person, place, and time. She appears well-developed and well-nourished. No distress.  HENT:  Head: Normocephalic and atraumatic.  Eyes: Pupils are equal, round, and reactive to light. EOM are normal.  Neck: Normal range of motion.  Neurological: She is alert and oriented to person, place, and time.  Skin: Skin is warm and dry. She is not diaphoretic.  Psychiatric: She has a normal mood and affect.  Nursing note and vitals reviewed. Motor strength is 5/5 bilateral hip flexor knee extensor ankle dorsiflexion she ambulates with a cane outside the home but she is able ambulate without a cane without evidence of toe drag or knee instability.        Assessment & Plan:  1.  Lumbar spinal stenosis doing well with home exercise program which consists of walking 1 hour/day. She has continued on tramadol 100 mill grams twice daily but I think based on her improvement in pain with her increased physical activity I would recommend going down to 50 mill grams twice daily.  She is written prescription for 100 twice daily but she will reduce this on her own to 50 twice daily so that the 1 month supply should last 2 months.  We may consider further reductions after that time.

## 2018-06-26 ENCOUNTER — Ambulatory Visit: Payer: Medicare Other

## 2018-06-28 ENCOUNTER — Telehealth: Payer: Self-pay | Admitting: *Deleted

## 2018-06-28 LAB — TOXASSURE SELECT,+ANTIDEPR,UR

## 2018-06-28 NOTE — Telephone Encounter (Signed)
Urine drug screen for this encounter is consistent for prescribed medication 

## 2018-07-16 ENCOUNTER — Other Ambulatory Visit: Payer: Self-pay | Admitting: Internal Medicine

## 2018-07-23 NOTE — Progress Notes (Addendum)
Subjective:   Theresa Cline is a 67 y.o. female who presents for Medicare Annual (Subsequent) preventive examination.  Review of Systems:  No ROS.  Medicare Wellness Visit. Additional risk factors are reflected in the social history.  Cardiac Risk Factors include: advanced age (>42men, >74 women);diabetes mellitus;hypertension Sleep patterns: feels rested on waking, gets up 1 times nightly to void and sleeps 7-8 hours nightly.    Home Safety/Smoke Alarms: Feels safe in home. Smoke alarms in place.  Living environment; residence and Adult nurse: apartment, equipment: Cane, Type: Wide Base [employer] and [employer], Type: Tub Seat/Chair, no firearms. Resides in a senior apartment complex. Lives alone, no needs for DME, limited support system Seat Belt Safety/Bike Helmet: Wears seat belt.      Objective:     Vitals: BP 122/82   Pulse 61   Resp 18   Ht 5\' 3"  (1.6 m)   Wt 132 lb (59.9 kg)   SpO2 98%   BMI 23.38 kg/m   Body mass index is 23.38 kg/m.  Advanced Directives 07/24/2018 06/21/2017 03/21/2017 07/28/2016 06/23/2016 02/24/2016 11/26/2015  Does Patient Have a Medical Advance Directive? No No No No No No No  Does patient want to make changes to medical advance directive? Yes (ED - Information included in AVS) - - - - - -  Would patient like information on creating a medical advance directive? - No - Patient declined Yes (ED - Information included in AVS) No - patient declined information - - -    Tobacco Social History   Tobacco Use  Smoking Status Former Smoker  . Packs/day: 1.50  . Years: 50.00  . Pack years: 75.00  . Types: Cigarettes  . Last attempt to quit: 11/05/2017  . Years since quitting: 0.7  Smokeless Tobacco Never Used  Tobacco Comment   09/26/17- smoking 1 ppwk//lmr     Counseling given: Not Answered Comment: 09/26/17- smoking 1 ppwk//lmr  Past Medical History:  Diagnosis Date  . Anemia, iron deficiency: Severe 07/29/2016  . Anxiety   .  Bronchitis, acute   . CAD (coronary artery disease)   . Cancer [facility])    Renal s/p nephrectomy  . CARPAL TUNNEL RELEASE, BILATERAL, HX OF 08/09/1998   Qualifier: Diagnosis of  By: Amil Amen MD, Benjamine Mola    . Chronic pain syndrome   . Diabetes mellitus type II, controlled ([place])   . Diabetes type 2, controlled ([place])   . Diabetic peripheral neuropathy ([place])   . Encounter for long-term (current) use of other medications   . GERD (gastroesophageal reflux disease)   . Hypertension   . 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 Mixed hyperlipidemia   . Obesity   . Osteoporosis   . Primary osteoarthritis of both knees   . S/P carpal tunnel release   . Sleep apnea    does not use CPAP  . Tobacco abuse   . Vertigo    Past Surgical History:  Procedure Laterality Date  . BREAST LUMPECTOMY WITH RADIOACTIVE SEED LOCALIZATION Left 11/30/2015   Procedure: BREAST LUMPECTOMY WITH RADIOACTIVE SEED LOCALIZATION;  Surgeon: Erroll Luna, MD;  Location: [facility];  Service: General;  Laterality: Left;  . CARPAL TUNNEL RELEASE Right 08/09/1998  . CARPAL TUNNEL RELEASE Left 09/08/1998  . CESAREAN SECTION     x3  . CORONARY ANGIOPLASTY WITH STENT PLACEMENT  2004   Normal LV function  . LIPOMA EXCISION Right    Right foot  . Resection  of Left Acoustic Neuroma  1999  . TOTAL ABDOMINAL HYSTERECTOMY W/ BILATERAL SALPINGOOPHORECTOMY  04/12/1988   For fibroid tumors; [place], [place]   Family History  Problem Relation Age of Onset  . Leukemia Mother        CLL  . Diabetes type II Mother   . Other Mother        Hypercholesterolemia  . Osteoarthritis Mother   . COPD Mother   . Hypertension Mother   . Hodgkin's lymphoma Father   . Diabetes type II Sister   . Osteoporosis Sister   . Paranoid behavior Brother        Unsure if diagnosed with Schizophrenia  . Diabetes type II Sister   . Hypertension Sister   . Diabetes type II Sister   . Hypertension Sister   . Schizophrenia Sister     . Heart disease Sister        Enlarged heart  . Glaucoma Sister   . Alcohol abuse Sister   . Glaucoma Sister   . Alcohol abuse Sister   . Bipolar disorder Daughter   . Lupus Daughter        SLE  . Healthy Daughter    Social History   Socioeconomic History  . Marital status: Divorced    Spouse name: Not on file  . Number of children: 1  . Years of education: Not on file  . Highest education level: Not on file  Occupational History  . Occupation: Barrister's clerk in hotel    Comment: Disabled  . Occupation: Retail    Comment: Disabled  Social Needs  . Financial resource strain: Not very hard  . Food insecurity:    Worry: Never true    Inability: Never true  . Transportation needs:    Medical: No    Non-medical: No  Tobacco Use  . Smoking status: Former Smoker    Packs/day: 1.50    Years: 50.00    Pack years: 75.00    Types: Cigarettes    Last attempt to quit: 11/05/2017    Years since quitting: 0.7  . Smokeless tobacco: Never Used  . Tobacco comment: 09/26/17- smoking 1 ppwk//lmr  Substance and Sexual Activity  . Alcohol use: No    Alcohol/week: 0.0 standard drinks  . Drug use: No  . Sexual activity: Never  Lifestyle  . Physical activity:    Days per week: 4 days    Minutes per session: 40 min  . Stress: Not at all  Relationships  . Social connections:    Talks on phone: More than three times a week    Gets together: More than three times a week    Attends religious service: 1 to 4 times per year    Active member of club or organization: Yes    Attends meetings of clubs or organizations: More than 4 times per year    Relationship status: Divorced  Other Topics Concern  . Not on file  Social History Narrative   Divorced   Disability mainly for back and chronic intermittent vertigo - since tumor removed - no surgical correction for back   Lives alone, keeps her grandson, 83 yr old daughter is a mother    Outpatient Encounter Medications as of 07/24/2018   Medication Sig  . ACCU-CHEK FASTCLIX LANCETS MISC USE TO TEST BLOOD SUGAR THREE TIMES A DAY AND EVERY NIGHT AT BEDTIME  . aspirin EC 81 MG tablet Take 81 mg by mouth at bedtime.   Marland Kitchen atorvastatin (LIPITOR) 40  MG tablet TAKE 1 TABLET BY MOUTH ONCE DAILY  . BIOTIN PO Take 1 tablet by mouth daily with breakfast.  . Cholecalciferol (VITAMIN D) 2000 UNITS CAPS Take 2,000 Units by mouth daily with breakfast.   . DULoxetine (CYMBALTA) 30 MG capsule TAKE 1 CAPSULE BY MOUTH ONCE DAILY ** OFFICE VISIT NEEDED FOR FURTHER REFILLS**  . glucose blood (ACCU-CHEK SMARTVIEW) test strip USE TO TEST BLOOD SUGAR THREE TIMES A DAY AND EVERY NIGHT AT BEDTIME  . hydrALAZINE (APRESOLINE) 25 MG tablet TAKE 3 TABLETS BY MOUTH THREE TIMES A DAY -- Office visit needed for further refills  . lidocaine (XYLOCAINE) 5 % ointment APPLY TOPICALLY THREE TIMES A DAY AS NEEDED  . losartan-hydrochlorothiazide (HYZAAR) 100-25 MG tablet TAKE 1 TABLET BY MOUTH ONCE DAILY  . metFORMIN (GLUCOPHAGE) 500 MG tablet TAKE 1 TABLET BY MOUTH TWICE DAILY WITH A MEAL  . metoprolol succinate (TOPROL-XL) 100 MG 24 hr tablet TAKE 1/2 TABLET BY MOUTH TWICE DAILY. TAKE WITH OR IMMEDIATELY FOLLOWING A MEAL  . Multiple Vitamin (MULTI-VITAMIN DAILY PO) Take 1 tablet by mouth daily.  . nitroGLYCERIN (NITROLINGUAL) 0.4 MG/SPRAY spray Place 1 spray under the tongue every 5 (five) minutes x 3 doses as needed for chest pain.  Marland Kitchen omeprazole (PRILOSEC) 20 MG capsule TAKE 1 CAPSULE BY MOUTH EVERY DAY  . PROAIR HFA 108 (90 Base) MCG/ACT inhaler INHALE 2 PUFFS BY MOUTH EVERY 6 HOURS AS NEEDED FOR WHEEZING OR SHORTNESS OF BREATH  . traMADol (ULTRAM) 50 MG tablet Take 2 tablets (100 mg total) by mouth 2 (two) times daily.  . vitamin E (VITAMIN E) 400 UNIT capsule Take 400 Units by mouth daily.  . [DISCONTINUED] omeprazole (PRILOSEC) 20 MG capsule TAKE 1 CAPSULE BY MOUTH EVERY DAY (Patient not taking: Reported on 07/24/2018)   No facility-administered encounter  medications on file as of 07/24/2018.     Activities of Daily Living In your present state of health, do you have any difficulty performing the following activities: 07/24/2018  Hearing? N  Vision? N  Difficulty concentrating or making decisions? N  Walking or climbing stairs? N  Dressing or bathing? N  Doing errands, shopping? N  Preparing Food and eating ? N  Using the Toilet? N  In the past six months, have you accidently leaked urine? N  Do you have problems with loss of bowel control? N  Managing your Medications? N  Managing your Finances? N  Housekeeping or managing your Housekeeping? N  Some recent data might be hidden    Patient Care Team: Binnie Rail, MD as PCP - General (Internal Medicine)    Assessment:   This is a routine wellness examination for [place]. Physical assessment deferred to PCP.   Exercise Activities and Dietary recommendations Current Exercise Habits: Home exercise routine;Structured exercise class, Type of exercise: walking;calisthenics, Exercise limited by: orthopedic condition(s)  Diet (meal preparation, eat out, water intake, caffeinated beverages, dairy products, fruits and vegetables): in general, a "healthy" diet  , well balanced   Reviewed heart healthy and diabetic diet. Encouraged patient to increase daily water and healthy fluid intake. Relevant patient education assigned to patient using Emmi.  Goals    . Be creative and make jewerly    . Patient Stated     Stay as healthy and as independent as possible. Continue to eat healthy and exercise.       Fall Risk Fall Risk  07/24/2018 06/21/2018 12/20/2017 07/12/2017 07/04/2017  Falls in the past year? No No No No  No  Number falls in past yr: - - - - -  Injury with Fall? - - - - -  Risk Factor Category  - - - - -  Risk for fall due to : - - - - -   Depression Screen PHQ 2/9 Scores 07/24/2018 06/21/2018 07/04/2017 03/21/2017  PHQ - 2 Score 0 0 0 0  PHQ- 9 Score - - - -     Cognitive  Function MMSE - Mini Mental State Exam 07/24/2018  Orientation to time 5  Orientation to Place 5  Registration 3  Attention/ Calculation 5  Recall 2  Language- name 2 objects 2  Language- repeat 1  Language- follow 3 step command 3  Language- read & follow direction 1  Write a sentence 1  Copy design 1  Total score 29        Immunization History  Administered Date(s) Administered  . Influenza Whole 10/15/2007  . Influenza, High Dose Seasonal PF 07/28/2016, 07/04/2017, 07/24/2018  . Influenza,inj,Quad PF,6+ Mos 09/25/2014  . Pneumococcal Conjugate-13 02/09/2016  . Pneumococcal Polysaccharide-23 08/09/2006, 01/19/2010, 03/21/2017  . Td 08/09/2006  . Tdap 01/22/2015   Screening Tests Health Maintenance  Topic Date Due  . OPHTHALMOLOGY EXAM  07/11/2017  . MAMMOGRAM  08/19/2017  . DEXA SCAN  03/15/2018  . HEMOGLOBIN A1C  05/26/2018  . FOOT EXAM  11/26/2018  . TETANUS/TDAP  01/21/2025  . COLONOSCOPY  08/21/2026  . INFLUENZA VACCINE  Completed  . Hepatitis C Screening  Completed  . PNA vac Low Risk Adult  Completed      Plan:     Continue doing brain stimulating activities (puzzles, reading, adult coloring books, staying active) to keep memory sharp.   Continue to eat heart healthy diet (full of fruits, vegetables, whole grains, lean protein, water--limit salt, fat, and sugar intake) and increase physical activity as tolerated.  I have personally reviewed and noted the following in the patient's chart:   . Medical and social history . Use of alcohol, tobacco or illicit drugs  . Current medications and supplements . Functional ability and status . Nutritional status . Physical activity . Advanced directives . List of other physicians . Vitals . Screenings to include cognitive, depression, and falls . Referrals and appointments  In addition, I have reviewed and discussed with patient certain preventive protocols, quality metrics, and best practice  recommendations. A written personalized care plan for preventive services as well as general preventive health recommendations were provided to patient.     Michiel Cowboy, RN  07/24/2018   Medical screening examination/treatment/procedure(s) were performed by non-physician practitioner and as supervising physician I was immediately available for consultation/collaboration. I agree with above. Binnie Rail, MD

## 2018-07-24 ENCOUNTER — Ambulatory Visit (INDEPENDENT_AMBULATORY_CARE_PROVIDER_SITE_OTHER): Payer: Medicare Other | Admitting: *Deleted

## 2018-07-24 VITALS — BP 122/82 | HR 61 | Resp 18 | Ht 63.0 in | Wt 132.0 lb

## 2018-07-24 DIAGNOSIS — Z0000 Encounter for general adult medical examination without abnormal findings: Secondary | ICD-10-CM

## 2018-07-24 DIAGNOSIS — Z23 Encounter for immunization: Secondary | ICD-10-CM | POA: Diagnosis not present

## 2018-07-24 DIAGNOSIS — E2839 Other primary ovarian failure: Secondary | ICD-10-CM

## 2018-07-24 NOTE — Patient Instructions (Addendum)
Continue doing brain stimulating activities (puzzles, reading, adult coloring books, staying active) to keep memory sharp.   Continue to eat heart healthy diet (full of fruits, vegetables, whole grains, lean protein, water--limit salt, fat, and sugar intake) and increase physical activity as tolerated.   [name] , Thank you for taking time to come for your Medicare Wellness Visit. I appreciate your ongoing commitment to your health goals. Please review the following plan we discussed and let me know if I can assist you in the future.   These are the goals we discussed: Goals    . Be creative and make jewerly    . Patient Stated     Stay as healthy and as independent as possible.       This is a list of the screening recommended for you and due dates:  Health Maintenance  Topic Date Due  . Eye exam for diabetics  07/11/2017  . Mammogram  08/19/2017  . DEXA scan (bone density measurement)  03/15/2018  . Flu Shot  05/09/2018  . Hemoglobin A1C  05/26/2018  . Complete foot exam   11/26/2018  . Tetanus Vaccine  01/21/2025  . Colon Cancer Screening  08/21/2026  .  Hepatitis C: One time screening is recommended by Center for Disease Control  (CDC) for  adults born from [birth year] through 1967.   Completed  . Pneumonia vaccines  Completed   Influenza Virus Vaccine injection What is this medicine? INFLUENZA VIRUS VACCINE (in floo EN zuh VAHY ruhs vak SEEN) helps to reduce the risk of getting influenza also known as the flu. The vaccine only helps protect you against some strains of the flu. This medicine may be used for other purposes; ask your health care provider or pharmacist if you have questions. COMMON BRAND NAME(S): Afluria, Agriflu, Alfuria, FLUAD, Fluarix, Fluarix Quadrivalent, Flublok, Flublok Quadrivalent, FLUCELVAX, Flulaval, Fluvirin, Fluzone, Fluzone High-Dose, Fluzone Intradermal What should I tell my health care provider before I take this medicine? They need to know if you  have any of these conditions: -bleeding disorder like hemophilia -fever or infection -Guillain-Barre syndrome or other neurological problems -immune system problems -infection with the human immunodeficiency virus (HIV) or AIDS -low blood platelet counts -multiple sclerosis -an unusual or allergic reaction to influenza virus vaccine, latex, other medicines, foods, dyes, or preservatives. Different brands of vaccines contain different allergens. Some may contain latex or eggs. Talk to your doctor about your allergies to make sure that you get the right vaccine. -pregnant or trying to get pregnant -breast-feeding How should I use this medicine? This vaccine is for injection into a muscle or under the skin. It is given by a health care professional. A copy of Vaccine Information Statements will be given before each vaccination. Read this sheet carefully each time. The sheet may change frequently. Talk to your healthcare provider to see which vaccines are right for you. Some vaccines should not be used in all age groups. Overdosage: If you think you have taken too much of this medicine contact a poison control center or emergency room at once. NOTE: This medicine is only for you. Do not share this medicine with others. What if I miss a dose? This does not apply. What may interact with this medicine? -chemotherapy or radiation therapy -medicines that lower your immune system like etanercept, anakinra, infliximab, and adalimumab -medicines that treat or prevent blood clots like warfarin -phenytoin -steroid medicines like prednisone or cortisone -theophylline -vaccines This list may not describe all possible interactions.  Give your health care provider a list of all the medicines, herbs, non-prescription drugs, or dietary supplements you use. Also tell them if you smoke, drink alcohol, or use illegal drugs. Some items may interact with your medicine. What should I watch for while using this  medicine? Report any side effects that do not go away within 3 days to your doctor or health care professional. Call your health care provider if any unusual symptoms occur within 6 weeks of receiving this vaccine. You may still catch the flu, but the illness is not usually as bad. You cannot get the flu from the vaccine. The vaccine will not protect against colds or other illnesses that may cause fever. The vaccine is needed every year. What side effects may I notice from receiving this medicine? Side effects that you should report to your doctor or health care professional as soon as possible: -allergic reactions like skin rash, itching or hives, swelling of the face, lips, or tongue Side effects that usually do not require medical attention (report to your doctor or health care professional if they continue or are bothersome): -fever -headache -muscle aches and pains -pain, tenderness, redness, or swelling at the injection site -tiredness This list may not describe all possible side effects. Call your doctor for medical advice about side effects. You may report side effects to FDA at 1-800-FDA-1088. Where should I keep my medicine? The vaccine will be given by a health care professional in a clinic, pharmacy, doctor's office, or other health care setting. You will not be given vaccine doses to store at home. NOTE: This sheet is a summary. It may not cover all possible information. If you have questions about this medicine, talk to your doctor, pharmacist, or health care provider.  2018 Elsevier/Gold Standard (2015-04-16 10:07:28)  Health Maintenance, Female Adopting a healthy lifestyle and getting preventive care can go a long way to promote health and wellness. Talk with your health care provider about what schedule of regular examinations is right for you. This is a good chance for you to check in with your provider about disease prevention and staying healthy. In between checkups, there  are plenty of things you can do on your own. Experts have done a lot of research about which lifestyle changes and preventive measures are most likely to keep you healthy. Ask your health care provider for more information. Weight and diet Eat a healthy diet  Be sure to include plenty of vegetables, fruits, low-fat dairy products, and lean protein.  Do not eat a lot of foods high in solid fats, added sugars, or salt.  Get regular exercise. This is one of the most important things you can do for your health. ? Most adults should exercise for at least 150 minutes each week. The exercise should increase your heart rate and make you sweat (moderate-intensity exercise). ? Most adults should also do strengthening exercises at least twice a week. This is in addition to the moderate-intensity exercise.  Maintain a healthy weight  Body mass index (BMI) is a measurement that can be used to identify possible weight problems. It estimates body fat based on height and weight. Your health care provider can help determine your BMI and help you achieve or maintain a healthy weight.  For females 58 years of age and older: ? A BMI below 18.5 is considered underweight. ? A BMI of 18.5 to 24.9 is normal. ? A BMI of 25 to 29.9 is considered overweight. ? A BMI of 30  and above is considered obese.  Watch levels of cholesterol and blood lipids  You should start having your blood tested for lipids and cholesterol at 69 years of age, then have this test every 5 years.  You may need to have your cholesterol levels checked more often if: ? Your lipid or cholesterol levels are high. ? You are older than 69 years of age. ? You are at high risk for heart disease.  Cancer screening Lung Cancer  Lung cancer screening is recommended for adults 70-48 years old who are at high risk for lung cancer because of a history of smoking.  A yearly low-dose CT scan of the lungs is recommended for people who: ? Currently  smoke. ? Have quit within the past 15 years. ? Have at least a 30-pack-year history of smoking. A pack year is smoking an average of one pack of cigarettes a day for 1 year.  Yearly screening should continue until it has been 15 years since you quit.  Yearly screening should stop if you develop a health problem that would prevent you from having lung cancer treatment.  Breast Cancer  Practice breast self-awareness. This means understanding how your breasts normally appear and feel.  It also means doing regular breast self-exams. Let your health care provider know about any changes, no matter how small.  If you are in your 20s or 30s, you should have a clinical breast exam (CBE) by a health care provider every 1-3 years as part of a regular health exam.  If you are 76 or older, have a CBE every year. Also consider having a breast X-ray (mammogram) every year.  If you have a family history of breast cancer, talk to your health care provider about genetic screening.  If you are at high risk for breast cancer, talk to your health care provider about having an MRI and a mammogram every year.  Breast cancer gene (BRCA) assessment is recommended for women who have family members with BRCA-related cancers. BRCA-related cancers include: ? Breast. ? Ovarian. ? Tubal. ? Peritoneal cancers.  Results of the assessment will determine the need for genetic counseling and BRCA1 and BRCA2 testing.  Cervical Cancer Your health care provider may recommend that you be screened regularly for cancer of the pelvic organs (ovaries, uterus, and vagina). This screening involves a pelvic examination, including checking for microscopic changes to the surface of your cervix (Pap test). You may be encouraged to have this screening done every 3 years, beginning at age 87.  For women ages 25-65, health care providers may recommend pelvic exams and Pap testing every 3 years, or they may recommend the Pap and pelvic  exam, combined with testing for human papilloma virus (HPV), every 5 years. Some types of HPV increase your risk of cervical cancer. Testing for HPV may also be done on women of any age with unclear Pap test results.  Other health care providers may not recommend any screening for nonpregnant women who are considered low risk for pelvic cancer and who do not have symptoms. Ask your health care provider if a screening pelvic exam is right for you.  If you have had past treatment for cervical cancer or a condition that could lead to cancer, you need Pap tests and screening for cancer for at least 20 years after your treatment. If Pap tests have been discontinued, your risk factors (such as having a new sexual partner) need to be reassessed to determine if screening should resume. Some  women have medical problems that increase the chance of getting cervical cancer. In these cases, your health care provider may recommend more frequent screening and Pap tests.  Colorectal Cancer  This type of cancer can be detected and often prevented.  Routine colorectal cancer screening usually begins at 69 years of age and continues through 69 years of age.  Your health care provider may recommend screening at an earlier age if you have risk factors for colon cancer.  Your health care provider may also recommend using home test kits to check for hidden blood in the stool.  A small camera at the end of a tube can be used to examine your colon directly (sigmoidoscopy or colonoscopy). This is done to check for the earliest forms of colorectal cancer.  Routine screening usually begins at age 38.  Direct examination of the colon should be repeated every 5-10 years through 69 years of age. However, you may need to be screened more often if early forms of precancerous polyps or small growths are found.  Skin Cancer  Check your skin from head to toe regularly.  Tell your health care provider about any new moles or  changes in moles, especially if there is a change in a mole's shape or color.  Also tell your health care provider if you have a mole that is larger than the size of a pencil eraser.  Always use sunscreen. Apply sunscreen liberally and repeatedly throughout the day.  Protect yourself by wearing long sleeves, pants, a wide-brimmed hat, and sunglasses whenever you are outside.  Heart disease, diabetes, and high blood pressure  High blood pressure causes heart disease and increases the risk of stroke. High blood pressure is more likely to develop in: ? People who have blood pressure in the high end of the normal range (130-139/85-89 mm Hg). ? People who are overweight or obese. ? People who are African American.  If you are 91-54 years of age, have your blood pressure checked every 3-5 years. If you are 33 years of age or older, have your blood pressure checked every year. You should have your blood pressure measured twice-once when you are at a hospital or clinic, and once when you are not at a hospital or clinic. Record the average of the two measurements. To check your blood pressure when you are not at a hospital or clinic, you can use: ? An automated blood pressure machine at a pharmacy. ? A home blood pressure monitor.  If you are between 72 years and 26 years old, ask your health care provider if you should take aspirin to prevent strokes.  Have regular diabetes screenings. This involves taking a blood sample to check your fasting blood sugar level. ? If you are at a normal weight and have a low risk for diabetes, have this test once every three years after 69 years of age. ? If you are overweight and have a high risk for diabetes, consider being tested at a younger age or more often. Preventing infection Hepatitis B  If you have a higher risk for hepatitis B, you should be screened for this virus. You are considered at high risk for hepatitis B if: ? You were born in a country where  hepatitis B is common. Ask your health care provider which countries are considered high risk. ? Your parents were born in a high-risk country, and you have not been immunized against hepatitis B (hepatitis B vaccine). ? You have HIV or AIDS. ?  You use needles to inject street drugs. ? You live with someone who has hepatitis B. ? You have had sex with someone who has hepatitis B. ? You get hemodialysis treatment. ? You take certain medicines for conditions, including cancer, organ transplantation, and autoimmune conditions.  Hepatitis C  Blood testing is recommended for: ? Everyone born from [birth year] through 1967. ? Anyone with known risk factors for hepatitis C.  Sexually transmitted infections (STIs)  You should be screened for sexually transmitted infections (STIs) including gonorrhea and chlamydia if: ? You are sexually active and are younger than 69 years of age. ? You are older than 69 years of age and your health care provider tells you that you are at risk for this type of infection. ? Your sexual activity has changed since you were last screened and you are at an increased risk for chlamydia or gonorrhea. Ask your health care provider if you are at risk.  If you do not have HIV, but are at risk, it may be recommended that you take a prescription medicine daily to prevent HIV infection. This is called pre-exposure prophylaxis (PrEP). You are considered at risk if: ? You are sexually active and do not regularly use condoms or know the HIV status of your partner(s). ? You take drugs by injection. ? You are sexually active with a partner who has HIV.  Talk with your health care provider about whether you are at high risk of being infected with HIV. If you choose to begin PrEP, you should first be tested for HIV. You should then be tested every 3 months for as long as you are taking PrEP. Pregnancy  If you are premenopausal and you may become pregnant, ask your health care provider  about preconception counseling.  If you may become pregnant, take 400 to 800 micrograms (mcg) of folic acid every day.  If you want to prevent pregnancy, talk to your health care provider about birth control (contraception). Osteoporosis and menopause  Osteoporosis is a disease in which the bones lose minerals and strength with aging. This can result in serious bone fractures. Your risk for osteoporosis can be identified using a bone density scan.  If you are 8 years of age or older, or if you are at risk for osteoporosis and fractures, ask your health care provider if you should be screened.  Ask your health care provider whether you should take a calcium or vitamin D supplement to lower your risk for osteoporosis.  Menopause may have certain physical symptoms and risks.  Hormone replacement therapy may reduce some of these symptoms and risks. Talk to your health care provider about whether hormone replacement therapy is right for you. Follow these instructions at home:  Schedule regular health, dental, and eye exams.  Stay current with your immunizations.  Do not use any tobacco products including cigarettes, chewing tobacco, or electronic cigarettes.  If you are pregnant, do not drink alcohol.  If you are breastfeeding, limit how much and how often you drink alcohol.  Limit alcohol intake to no more than 1 drink per day for nonpregnant women. One drink equals 12 ounces of beer, 5 ounces of wine, or 1 ounces of hard liquor.  Do not use street drugs.  Do not share needles.  Ask your health care provider for help if you need support or information about quitting drugs.  Tell your health care provider if you often feel depressed.  Tell your health care provider if you have ever  been abused or do not feel safe at home. This information is not intended to replace advice given to you by your health care provider. Make sure you discuss any questions you have with your health care  provider. Document Released: 04/10/2011 Document Revised: 03/02/2016 Document Reviewed: 06/29/2015 Elsevier Interactive Patient Education  [employer].

## 2018-08-01 ENCOUNTER — Ambulatory Visit
Admission: RE | Admit: 2018-08-01 | Discharge: 2018-08-01 | Disposition: A | Discharge status: Home / Self Care | Payer: Medicare Other | Source: Ambulatory Visit | Attending: Internal Medicine | Admitting: Internal Medicine

## 2018-08-01 DIAGNOSIS — Z1231 Encounter for screening mammogram for malignant neoplasm of breast: Secondary | ICD-10-CM | POA: Diagnosis not present

## 2018-08-07 ENCOUNTER — Ambulatory Visit (INDEPENDENT_AMBULATORY_CARE_PROVIDER_SITE_OTHER)
Admission: RE | Admit: 2018-08-07 | Discharge: 2018-08-07 | Disposition: A | Discharge status: Home / Self Care | Payer: Medicare Other | Source: Ambulatory Visit | Attending: Internal Medicine | Admitting: Internal Medicine

## 2018-08-07 DIAGNOSIS — E2839 Other primary ovarian failure: Secondary | ICD-10-CM

## 2018-08-13 ENCOUNTER — Other Ambulatory Visit: Payer: Self-pay | Admitting: Physical Medicine & Rehabilitation

## 2018-08-13 ENCOUNTER — Other Ambulatory Visit: Payer: Self-pay | Admitting: Internal Medicine

## 2018-08-13 NOTE — Progress Notes (Signed)
Subjective:    Patient ID: Theresa Cline, female    DOB: 11/12/1950, 67 y.o.   MRN: [id number]  HPI The patient is here for follow up.  She is having left shoulder pain.  She was diagnosed with bursitis a while back.  Last night it was aching and she had difficulty sleeping.    CAD, Hypertension: She is taking her medication daily. She is compliant with a low sodium diet.  She denies chest pain, palpitations, edema, shortness of breath and regular headaches. She is exercising regularly-uses floor peddling machine and walks a little.  She does not monitor her blood pressure at home.    Diabetes: She is taking her medication daily as prescribed. She is compliant with a diabetic diet. She is exercising regularly.  She is up-to-date with an ophthalmology examination and has an appt coming up.   Hyperlipidemia: She is taking her medication daily. She is compliant with a low fat/cholesterol diet. She is exercising regularly. She denies myalgias.   GERD:  She is taking her medication daily as prescribed.  She denies any GERD symptoms and feels her GERD is well controlled.   Weight loss:  She is eating the same and is exercising the same.  She does not weigh herself at home. She does not feel she should have lost weight - there have not been any changes.  She does eat more vegetables over the summer.     Medications and allergies reviewed with patient and updated if appropriate.  Patient Active Problem List   Diagnosis Date Noted  . COPD GOLD III still smoking 11/12/2017  . Cigarette smoker 09/26/2017  . Abnormal chest xray 08/22/2017  . History of renal cell carcinoma 02/05/2017  . Dyspnea on exertion 07/29/2016  . Lightheadedness 07/29/2016  . DM type 2, controlled, with complication ([place]) 19/37/9024  . Osteopenia 03/15/2016  . Facet arthropathy, lumbosacral 02/24/2016  . GERD (gastroesophageal reflux disease) 02/09/2016  . Acoustic neuroma ([place]) 02/09/2016  . Right knee DJD  01/18/2012  . Trochanteric bursitis of both hips 12/11/2011  . Spinal stenosis, lumbar region, without neurogenic claudication 12/11/2011  . Syncope 07/17/2011  . Dizziness and giddiness 11/06/2008  . Diabetic neuropathy associated with type 2 diabetes mellitus ([place]) 10/15/2007  . Mixed hyperlipidemia 10/15/2007  . Essential hypertension 10/15/2007  . Coronary atherosclerosis 10/15/2007  . Osteoarthrosis, unspecified whether generalized or localized, involving lower leg 10/15/2007    Current Outpatient Medications on File Prior to Visit  Medication Sig Dispense Refill  . ACCU-CHEK FASTCLIX LANCETS MISC USE TO TEST BLOOD SUGAR THREE TIMES A DAY AND EVERY NIGHT AT BEDTIME 102 each 2  . ACCU-CHEK SMARTVIEW test strip USE TO TEST BLOOD SUGAR THREE TIMES A DAY AND EVERY NIGHT AT BEDTIME 100 each 1  . aspirin EC 81 MG tablet Take 81 mg by mouth at bedtime.     Marland Kitchen atorvastatin (LIPITOR) 40 MG tablet TAKE 1 TABLET BY MOUTH ONCE DAILY 90 tablet 0  . BIOTIN PO Take 1 tablet by mouth daily with breakfast.    . Cholecalciferol (VITAMIN D) 2000 UNITS CAPS Take 2,000 Units by mouth daily with breakfast.     . DULoxetine (CYMBALTA) 30 MG capsule TAKE 1 CAPSULE BY MOUTH ONCE DAILY ** OFFICE VISIT NEEDED FOR FURTHER REFILLS** 30 capsule 0  . hydrALAZINE (APRESOLINE) 25 MG tablet TAKE 3 TABLETS BY MOUTH THREE TIMES A DAY **OFFICE VISIT NEED FOR FURTHER REFILLS** 270 tablet 0  . lidocaine (XYLOCAINE) 5 % ointment APPLY  TOPICALLY THREE TIMES A DAY AS NEEDED 100 g 1  . losartan-hydrochlorothiazide (HYZAAR) 100-25 MG tablet TAKE 1 TABLET BY MOUTH ONCE DAILY 228 tablet 1  . metFORMIN (GLUCOPHAGE) 500 MG tablet TAKE 1 TABLET BY MOUTH TWICE DAILY WITH A MEAL 180 tablet 1  . metoprolol succinate (TOPROL-XL) 100 MG 24 hr tablet TAKE 1/2 TABLET BY MOUTH TWICE DAILY. TAKE WITH OR IMMEDIATELY FOLLOWING A MEAL 90 tablet 0  . Multiple Vitamin (MULTI-VITAMIN DAILY PO) Take 1 tablet by mouth daily.    . nitroGLYCERIN  (NITROLINGUAL) 0.4 MG/SPRAY spray Place 1 spray under the tongue every 5 (five) minutes x 3 doses as needed for chest pain. 12 g 1  . omeprazole (PRILOSEC) 20 MG capsule TAKE 1 CAPSULE BY MOUTH EVERY DAY 90 capsule 1  . PROAIR HFA 108 (90 Base) MCG/ACT inhaler INHALE 2 PUFFS BY MOUTH EVERY 6 HOURS AS NEEDED FOR WHEEZING OR SHORTNESS OF BREATH 17 each 1  . traMADol (ULTRAM) 50 MG tablet Take 2 tablets (100 mg total) by mouth 2 (two) times daily. 120 tablet 0  . vitamin E (VITAMIN E) 400 UNIT capsule Take 400 Units by mouth daily.     No current facility-administered medications on file prior to visit.     Past Medical History:  Diagnosis Date  . Anemia, iron deficiency: Severe 07/29/2016  . Anxiety   . Bronchitis, acute   . CAD (coronary artery disease)   . Cancer [facility])    Renal s/p nephrectomy  . CARPAL TUNNEL RELEASE, BILATERAL, HX OF 08/09/1998   Qualifier: Diagnosis of  By: Amil Amen MD, Benjamine Mola    . Chronic pain syndrome   . Diabetes mellitus type II, controlled ([place])   . Diabetes type 2, controlled ([place])   . Diabetic peripheral neuropathy ([place])   . Encounter for long-term (current) use of other medications   . GERD (gastroesophageal reflux disease)   . Hypertension   . 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 Mixed hyperlipidemia   . Obesity   . Osteoporosis   . Primary osteoarthritis of both knees   . S/P carpal tunnel release   . Sleep apnea    does not use CPAP  . Tobacco abuse   . Vertigo     Past Surgical History:  Procedure Laterality Date  . BREAST LUMPECTOMY Left 2017   benign  . BREAST LUMPECTOMY WITH RADIOACTIVE SEED LOCALIZATION Left 11/30/2015   Procedure: BREAST LUMPECTOMY WITH RADIOACTIVE SEED LOCALIZATION;  Surgeon: Erroll Luna, MD;  Location: [facility];  Service: General;  Laterality: Left;  . CARPAL TUNNEL RELEASE Right 08/09/1998  . CARPAL TUNNEL RELEASE Left 09/08/1998  . CESAREAN SECTION     x3  . CORONARY ANGIOPLASTY WITH  STENT PLACEMENT  2004   Normal LV function  . LIPOMA EXCISION Right    Right foot  . Resection of Left Acoustic Neuroma  1999  . TOTAL ABDOMINAL HYSTERECTOMY W/ BILATERAL SALPINGOOPHORECTOMY  04/12/1988   For fibroid tumors; [place], [place]    Social History   Socioeconomic History  . Marital status: Divorced    Spouse name: Not on file  . Number of children: 1  . Years of education: Not on file  . Highest education level: Not on file  Occupational History  . Occupation: Barrister's clerk in hotel    Comment: Disabled  . Occupation: Retail    Comment: Disabled  Social Needs  . Financial resource strain: Not very hard  . Food insecurity:  Worry: Never true    Inability: Never true  . Transportation needs:    Medical: No    Non-medical: No  Tobacco Use  . Smoking status: Former Smoker    Packs/day: 1.50    Years: 50.00    Pack years: 75.00    Types: Cigarettes    Last attempt to quit: 11/05/2017    Years since quitting: 0.7  . Smokeless tobacco: Never Used  . Tobacco comment: 09/26/17- smoking 1 ppwk//lmr  Substance and Sexual Activity  . Alcohol use: No    Alcohol/week: 0.0 standard drinks  . Drug use: No  . Sexual activity: Never  Lifestyle  . Physical activity:    Days per week: 4 days    Minutes per session: 40 min  . Stress: Not at all  Relationships  . Social connections:    Talks on phone: More than three times a week    Gets together: More than three times a week    Attends religious service: 1 to 4 times per year    Active member of club or organization: Yes    Attends meetings of clubs or organizations: More than 4 times per year    Relationship status: Divorced  Other Topics Concern  . Not on file  Social History Narrative   Divorced   Disability mainly for back and chronic intermittent vertigo - since tumor removed - no surgical correction for back   Lives alone, keeps her grandson, 56 yr old daughter is a mother    Family History  Problem  Relation Age of Onset  . Leukemia Mother        CLL  . Diabetes type II Mother   . Other Mother        Hypercholesterolemia  . Osteoarthritis Mother   . COPD Mother   . Hypertension Mother   . Hodgkin's lymphoma Father   . Diabetes type II Sister   . Osteoporosis Sister   . Paranoid behavior Brother        Unsure if diagnosed with Schizophrenia  . Diabetes type II Sister   . Hypertension Sister   . Diabetes type II Sister   . Hypertension Sister   . Schizophrenia Sister   . Heart disease Sister        Enlarged heart  . Glaucoma Sister   . Alcohol abuse Sister   . Glaucoma Sister   . Alcohol abuse Sister   . Bipolar disorder Daughter   . Lupus Daughter        SLE  . Healthy Daughter     Review of Systems  Constitutional: Positive for unexpected weight change. Negative for appetite change, chills, diaphoresis and fever.  Respiratory: Negative for cough, shortness of breath and wheezing.   Cardiovascular: Negative for chest pain, palpitations and leg swelling.  Gastrointestinal: Negative for abdominal pain, blood in stool, constipation, diarrhea and nausea.  Genitourinary: Negative for dysuria and hematuria.  Musculoskeletal: Positive for arthralgias and back pain.  Neurological: Positive for headaches (occasional).       Objective:   Vitals:   08/14/18 0847  BP: 130/84  Pulse: 68  Resp: 19  Temp: 97.9 F (36.6 C)  SpO2: 98%   BP Readings from Last 3 Encounters:  08/14/18 130/84  07/24/18 122/82  06/21/18 139/79   Wt Readings from Last 3 Encounters:  08/14/18 126 lb (57.2 kg)  07/24/18 132 lb (59.9 kg)  06/21/18 132 lb 8 oz (60.1 kg)  11/26/2017     142  lbs  Body mass index is 22.32 kg/m.   Physical Exam    Constitutional: Appears well-developed and well-nourished. No distress.  HENT:  Head: Normocephalic and atraumatic.  Neck: Neck supple. No tracheal deviation present. No thyromegaly present.  No cervical lymphadenopathy Cardiovascular: Normal  rate, regular rhythm and normal heart sounds.   No murmur heard. No carotid bruit .  No edema Pulmonary/Chest: Effort normal and breath sounds normal. No respiratory distress. No has no wheezes. No rales.  Skin: Skin is warm and dry. Not diaphoretic.  Psychiatric: Normal mood and affect. Behavior is normal.      Assessment & Plan:    See Problem List for Assessment and Plan of chronic medical problems.

## 2018-08-14 ENCOUNTER — Other Ambulatory Visit (INDEPENDENT_AMBULATORY_CARE_PROVIDER_SITE_OTHER): Payer: Medicare Other

## 2018-08-14 ENCOUNTER — Encounter: Payer: Self-pay | Admitting: Internal Medicine

## 2018-08-14 ENCOUNTER — Ambulatory Visit (INDEPENDENT_AMBULATORY_CARE_PROVIDER_SITE_OTHER): Payer: Medicare Other | Admitting: Internal Medicine

## 2018-08-14 VITALS — BP 130/84 | HR 68 | Temp 97.9°F | Resp 19 | Ht 63.0 in | Wt 126.0 lb

## 2018-08-14 DIAGNOSIS — K219 Gastro-esophageal reflux disease without esophagitis: Secondary | ICD-10-CM

## 2018-08-14 DIAGNOSIS — E118 Type 2 diabetes mellitus with unspecified complications: Secondary | ICD-10-CM

## 2018-08-14 DIAGNOSIS — R634 Abnormal weight loss: Secondary | ICD-10-CM

## 2018-08-14 DIAGNOSIS — I2510 Atherosclerotic heart disease of native coronary artery without angina pectoris: Secondary | ICD-10-CM

## 2018-08-14 DIAGNOSIS — E782 Mixed hyperlipidemia: Secondary | ICD-10-CM | POA: Diagnosis not present

## 2018-08-14 DIAGNOSIS — I10 Essential (primary) hypertension: Secondary | ICD-10-CM | POA: Diagnosis not present

## 2018-08-14 DIAGNOSIS — M25512 Pain in left shoulder: Secondary | ICD-10-CM | POA: Insufficient documentation

## 2018-08-14 LAB — CBC WITH DIFFERENTIAL/PLATELET
Basophils Absolute: 0 10*3/uL (ref 0.0–0.1)
Basophils Relative: 0.8 % (ref 0.0–3.0)
Eosinophils Absolute: 0.2 10*3/uL (ref 0.0–0.7)
Eosinophils Relative: 2.5 % (ref 0.0–5.0)
HEMATOCRIT: 39.4 % (ref 36.0–46.0)
HEMOGLOBIN: 12.9 g/dL (ref 12.0–15.0)
LYMPHS PCT: 28.3 % (ref 12.0–46.0)
Lymphs Abs: 1.8 10*3/uL (ref 0.7–4.0)
MCHC: 32.8 g/dL (ref 30.0–36.0)
MCV: 89.2 fl (ref 78.0–100.0)
MONOS PCT: 12.1 % — AB (ref 3.0–12.0)
Monocytes Absolute: 0.8 10*3/uL (ref 0.1–1.0)
Neutro Abs: 3.5 10*3/uL (ref 1.4–7.7)
Neutrophils Relative %: 56.3 % (ref 43.0–77.0)
Platelets: 195 10*3/uL (ref 150.0–400.0)
RBC: 4.42 Mil/uL (ref 3.87–5.11)
RDW: 14.3 % (ref 11.5–15.5)
WBC: 6.3 10*3/uL (ref 4.0–10.5)

## 2018-08-14 LAB — COMPREHENSIVE METABOLIC PANEL
ALT: 9 U/L (ref 0–35)
AST: 16 U/L (ref 0–37)
Albumin: 4.3 g/dL (ref 3.5–5.2)
Alkaline Phosphatase: 45 U/L (ref 39–117)
BUN: 19 mg/dL (ref 6–23)
CALCIUM: 10 mg/dL (ref 8.4–10.5)
CO2: 34 meq/L — AB (ref 19–32)
Chloride: 99 mEq/L (ref 96–112)
Creatinine, Ser: 0.98 mg/dL (ref 0.40–1.20)
GFR: 72.21 mL/min (ref >60.00)
GLUCOSE: 77 mg/dL (ref 70–99)
POTASSIUM: 4.3 meq/L (ref 3.5–5.1)
Sodium: 140 mEq/L (ref 135–145)
Total Bilirubin: 0.3 mg/dL (ref 0.2–1.2)
Total Protein: 7.5 g/dL (ref 6.0–8.3)

## 2018-08-14 LAB — LIPID PANEL
Cholesterol: 135 mg/dL (ref 0–200)
HDL: 51.2 mg/dL (ref >39.00)
LDL CALC: 72 mg/dL (ref 0–99)
NONHDL: 84.28
Total CHOL/HDL Ratio: 3
Triglycerides: 63 mg/dL (ref 0.0–149.0)
VLDL: 12.6 mg/dL (ref 0.0–40.0)

## 2018-08-14 LAB — TSH: TSH: 1.18 u[IU]/mL (ref 0.35–4.50)

## 2018-08-14 LAB — HEMOGLOBIN A1C: HEMOGLOBIN A1C: 6.1 % (ref 4.6–6.5)

## 2018-08-14 MED ORDER — DICLOFENAC SODIUM 1 % TD GEL: 4.0000 g | Freq: Four times a day (QID) | TRANSDERMAL | 5 refills | Status: AC

## 2018-08-14 NOTE — Assessment & Plan Note (Signed)
Check lipid panel  Continue daily statin Regular exercise and healthy diet encouraged  

## 2018-08-14 NOTE — Assessment & Plan Note (Signed)
GERD controlled Continue daily medication  

## 2018-08-14 NOTE — Assessment & Plan Note (Signed)
BP well controlled Current regimen effective and well tolerated Continue current medications at current doses cmp  

## 2018-08-14 NOTE — Assessment & Plan Note (Signed)
Taking metformin - ? May need to discontinue Check a1c Low sugar / carb diet Continue regular exercise

## 2018-08-14 NOTE — Patient Instructions (Addendum)
  Tests ordered today. Your results will be released to [facility] (or called to you) after review, usually within 72hours after test completion. If any changes need to be made, you will be notified at that same time.  Medications reviewed and updated.  Changes include :   Trying diclofenac gel on your shoulder and other joints with arthritis  Your prescription(s) have been submitted to your pharmacy. Please take as directed and contact our office if you believe you are having problem(s) with the medication(s).   Please followup in 1 month

## 2018-08-14 NOTE — Assessment & Plan Note (Signed)
Weight for no obvious reason - eating the same and exercising the same  -- has been eating more veges Increase intake Monitor weight at home Will f/u in 1 month  Check labs today - tsh, cbc, cmp Will need further eval in one month if she continues to lose weight

## 2018-08-14 NOTE — Assessment & Plan Note (Signed)
No chest pain, palps, SOB Continue current medication

## 2018-08-14 NOTE — Assessment & Plan Note (Signed)
H/o bursitis in left shoulder Has seen sports med and will see him again Trial of diclofenac gel - sent to pharmacy

## 2018-09-10 ENCOUNTER — Other Ambulatory Visit: Payer: Self-pay | Admitting: Internal Medicine

## 2018-09-15 NOTE — Progress Notes (Signed)
Subjective:    Patient ID: Theresa Cline, female    DOB: 01-14-1951, 67 y.o.   MRN: [id number]  HPI The patient is here for follow up.  Hypertension: She is taking her medication daily. She is compliant with a low sodium diet.  She denies chest pain, palpitations, edema, shortness of breath and regular headaches. She is exercising - walking.     Diabetes: She is taking her medication daily as prescribed. She is compliant with a diabetic diet. She sometimes forgets to eat and gets shakey.  She is exercising regularly.    Weight loss:  At her last visit she had lost weight w/o any cause.  She does walking.   Spinal stenosis in lower back:  She is following with pain management and takes tramadol.  She is also taking Cymbalta, which helps.    Medications and allergies reviewed with patient and updated if appropriate.  Patient Active Problem List   Diagnosis Date Noted  . Weight loss 08/14/2018  . Left shoulder pain 08/14/2018  . COPD GOLD III still smoking 11/12/2017  . Cigarette smoker 09/26/2017  . Abnormal chest xray 08/22/2017  . History of renal cell carcinoma 02/05/2017  . Dyspnea on exertion 07/29/2016  . Lightheadedness 07/29/2016  . DM type 2, controlled, with complication ([place]) 40/34/7425  . Osteopenia 03/15/2016  . Facet arthropathy, lumbosacral 02/24/2016  . GERD (gastroesophageal reflux disease) 02/09/2016  . Acoustic neuroma ([place]) 02/09/2016  . Right knee DJD 01/18/2012  . Trochanteric bursitis of both hips 12/11/2011  . Spinal stenosis, lumbar region, without neurogenic claudication 12/11/2011  . Syncope 07/17/2011  . Dizziness and giddiness 11/06/2008  . Diabetic neuropathy associated with type 2 diabetes mellitus ([place]) 10/15/2007  . Mixed hyperlipidemia 10/15/2007  . Essential hypertension 10/15/2007  . Coronary atherosclerosis 10/15/2007  . Osteoarthrosis, unspecified whether generalized or localized, involving lower leg 10/15/2007    Current  Outpatient Medications on File Prior to Visit  Medication Sig Dispense Refill  . ACCU-CHEK FASTCLIX LANCETS MISC USE TO TEST BLOOD SUGAR THREE TIMES A DAY AND EVERY NIGHT AT BEDTIME 102 each 2  . ACCU-CHEK SMARTVIEW test strip USE TO TEST BLOOD SUGAR THREE TIMES A DAY AND EVERY NIGHT AT BEDTIME 100 each 1  . aspirin EC 81 MG tablet Take 81 mg by mouth at bedtime.     Marland Kitchen atorvastatin (LIPITOR) 40 MG tablet TAKE 1 TABLET BY MOUTH ONCE DAILY 90 tablet 0  . BIOTIN PO Take 1 tablet by mouth daily with breakfast.    . Cholecalciferol (VITAMIN D) 2000 UNITS CAPS Take 2,000 Units by mouth daily with breakfast.     . diclofenac sodium (VOLTAREN) 1 % GEL Apply 4 g topically 4 (four) times daily. 100 g 5  . DULoxetine (CYMBALTA) 30 MG capsule TAKE 1 CAPSULE BY MOUTH ONCE DAILY ** OFFICE VISIT NEEDED FOR FURTHER REFILLS** 30 capsule 0  . hydrALAZINE (APRESOLINE) 25 MG tablet TAKE 3 TABLETS BY MOUTH THREE TIMES A DAY **OFFICE VISIT NEED FOR FURTHER REFILLS** 270 tablet 0  . lidocaine (XYLOCAINE) 5 % ointment APPLY TOPICALLY THREE TIMES A DAY AS NEEDED 100 g 1  . losartan-hydrochlorothiazide (HYZAAR) 100-25 MG tablet TAKE 1 TABLET BY MOUTH ONCE DAILY 228 tablet 1  . metFORMIN (GLUCOPHAGE) 500 MG tablet TAKE 1 TABLET BY MOUTH TWICE DAILY WITH A MEAL 180 tablet 1  . metoprolol succinate (TOPROL-XL) 100 MG 24 hr tablet TAKE 1/2 TABLET BY MOUTH TWICE DAILY. TAKE WITH OR IMMEDIATELY FOLLOWING A MEAL 90  tablet 0  . Multiple Vitamin (MULTI-VITAMIN DAILY PO) Take 1 tablet by mouth daily.    . nitroGLYCERIN (NITROLINGUAL) 0.4 MG/SPRAY spray Place 1 spray under the tongue every 5 (five) minutes x 3 doses as needed for chest pain. 12 g 1  . omeprazole (PRILOSEC) 20 MG capsule TAKE 1 CAPSULE BY MOUTH EVERY DAY 90 capsule 1  . PROAIR HFA 108 (90 Base) MCG/ACT inhaler INHALE 2 PUFFS BY MOUTH EVERY 6 HOURS AS NEEDED FOR WHEEZING OR SHORTNESS OF BREATH 17 each 1  . traMADol (ULTRAM) 50 MG tablet Take 1 tablet (50 mg total) by  mouth 2 (two) times daily. 60 tablet 3  . vitamin E (VITAMIN E) 400 UNIT capsule Take 400 Units by mouth daily.     No current facility-administered medications on file prior to visit.     Past Medical History:  Diagnosis Date  . Anemia, iron deficiency: Severe 07/29/2016  . Anxiety   . Bronchitis, acute   . CAD (coronary artery disease)   . Cancer [facility])    Renal s/p nephrectomy  . CARPAL TUNNEL RELEASE, BILATERAL, HX OF 08/09/1998   Qualifier: Diagnosis of  By: Amil Amen MD, Benjamine Mola    . Chronic pain syndrome   . Diabetes mellitus type II, controlled ([place])   . Diabetes type 2, controlled ([place])   . Diabetic peripheral neuropathy ([place])   . Encounter for long-term (current) use of other medications   . GERD (gastroesophageal reflux disease)   . Hypertension   . 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 Mixed hyperlipidemia   . Obesity   . Osteoporosis   . Primary osteoarthritis of both knees   . S/P carpal tunnel release   . Sleep apnea    does not use CPAP  . Tobacco abuse   . Vertigo     Past Surgical History:  Procedure Laterality Date  . BREAST LUMPECTOMY Left 2017   benign  . BREAST LUMPECTOMY WITH RADIOACTIVE SEED LOCALIZATION Left 11/30/2015   Procedure: BREAST LUMPECTOMY WITH RADIOACTIVE SEED LOCALIZATION;  Surgeon: Erroll Luna, MD;  Location: [place];  Service: General;  Laterality: Left;  . CARPAL TUNNEL RELEASE Right 08/09/1998  . CARPAL TUNNEL RELEASE Left 09/08/1998  . CESAREAN SECTION     x3  . CORONARY ANGIOPLASTY WITH STENT PLACEMENT  2004   Normal LV function  . LIPOMA EXCISION Right    Right foot  . Resection of Left Acoustic Neuroma  1999  . TOTAL ABDOMINAL HYSTERECTOMY W/ BILATERAL SALPINGOOPHORECTOMY  04/12/1988   For fibroid tumors; [place], [place]    Social History   Socioeconomic History  . Marital status: Divorced    Spouse name: Not on file  . Number of children: 1  . Years of education: Not on file  . Highest  education level: Not on file  Occupational History  . Occupation: Barrister's clerk in hotel    Comment: Disabled  . Occupation: Retail    Comment: Disabled  Social Needs  . Financial resource strain: Not very hard  . Food insecurity:    Worry: Never true    Inability: Never true  . Transportation needs:    Medical: No    Non-medical: No  Tobacco Use  . Smoking status: Former Smoker    Packs/day: 1.50    Years: 50.00    Pack years: 75.00    Types: Cigarettes    Last attempt to quit: 11/05/2017    Years since quitting: 0.8  .  Smokeless tobacco: Never Used  . Tobacco comment: 09/26/17- smoking 1 ppwk//lmr  Substance and Sexual Activity  . Alcohol use: No    Alcohol/week: 0.0 standard drinks  . Drug use: No  . Sexual activity: Never  Lifestyle  . Physical activity:    Days per week: 4 days    Minutes per session: 40 min  . Stress: Not at all  Relationships  . Social connections:    Talks on phone: More than three times a week    Gets together: More than three times a week    Attends religious service: 1 to 4 times per year    Active member of club or organization: Yes    Attends meetings of clubs or organizations: More than 4 times per year    Relationship status: Divorced  Other Topics Concern  . Not on file  Social History Narrative   Divorced   Disability mainly for back and chronic intermittent vertigo - since tumor removed - no surgical correction for back   Lives alone, keeps her grandson, 7 yr old daughter is a mother    Family History  Problem Relation Age of Onset  . Leukemia Mother        CLL  . Diabetes type II Mother   . Other Mother        Hypercholesterolemia  . Osteoarthritis Mother   . COPD Mother   . Hypertension Mother   . Hodgkin's lymphoma Father   . Diabetes type II Sister   . Osteoporosis Sister   . Paranoid behavior Brother        Unsure if diagnosed with Schizophrenia  . Diabetes type II Sister   . Hypertension Sister   . Diabetes type  II Sister   . Hypertension Sister   . Schizophrenia Sister   . Heart disease Sister        Enlarged heart  . Glaucoma Sister   . Alcohol abuse Sister   . Glaucoma Sister   . Alcohol abuse Sister   . Bipolar disorder Daughter   . Lupus Daughter        SLE  . Healthy Daughter     Review of Systems  Constitutional: Negative for fever.  Respiratory: Negative for cough, shortness of breath and wheezing.   Cardiovascular: Negative for chest pain, palpitations and leg swelling.  Gastrointestinal: Negative for abdominal pain.  Neurological: Positive for headaches (occ). Negative for light-headedness.       Objective:   Vitals:   09/16/18 0817  BP: (!) 162/78  Pulse: 77  Resp: 16  Temp: 98.1 F (36.7 C)  SpO2: 92%   BP Readings from Last 3 Encounters:  09/16/18 (!) 162/78  08/14/18 130/84  07/24/18 122/82   Wt Readings from Last 3 Encounters:  09/16/18 126 lb 12.8 oz (57.5 kg)  08/14/18 126 lb (57.2 kg)  07/24/18 132 lb (59.9 kg)   Body mass index is 22.46 kg/m.   Physical Exam    Constitutional: Appears well-developed and well-nourished. No distress.  HENT:  Head: Normocephalic and atraumatic.  Neck: Neck supple. No tracheal deviation present. No thyromegaly present.  No cervical lymphadenopathy Cardiovascular: Normal rate, regular rhythm and normal heart sounds.   No murmur heard. No carotid bruit .  No edema Pulmonary/Chest: Effort normal and breath sounds normal. No respiratory distress. No has no wheezes. No rales.  Skin: Skin is warm and dry. Not diaphoretic.  Psychiatric: Normal mood and affect. Behavior is normal.  Assessment & Plan:    See Problem List for Assessment and Plan of chronic medical problems.

## 2018-09-15 NOTE — Patient Instructions (Addendum)
  Medications reviewed and updated.  Changes include :   Decrease metformin to once day.      Please followup in 6 months

## 2018-09-16 ENCOUNTER — Ambulatory Visit (INDEPENDENT_AMBULATORY_CARE_PROVIDER_SITE_OTHER): Payer: Medicare Other | Admitting: Internal Medicine

## 2018-09-16 ENCOUNTER — Encounter: Payer: Self-pay | Admitting: Internal Medicine

## 2018-09-16 VITALS — BP 162/78 | HR 77 | Temp 98.1°F | Resp 16 | Ht 63.0 in | Wt 126.8 lb

## 2018-09-16 DIAGNOSIS — M48061 Spinal stenosis, lumbar region without neurogenic claudication: Secondary | ICD-10-CM | POA: Diagnosis not present

## 2018-09-16 DIAGNOSIS — E118 Type 2 diabetes mellitus with unspecified complications: Secondary | ICD-10-CM | POA: Diagnosis not present

## 2018-09-16 DIAGNOSIS — R634 Abnormal weight loss: Secondary | ICD-10-CM | POA: Diagnosis not present

## 2018-09-16 DIAGNOSIS — I10 Essential (primary) hypertension: Secondary | ICD-10-CM | POA: Diagnosis not present

## 2018-09-16 MED ORDER — HYDRALAZINE HCL 25 MG PO TABS: ORAL_TABLET | ORAL | 1 refills | Status: DC

## 2018-09-16 MED ORDER — METFORMIN HCL 500 MG PO TABS: 500.0000 mg | ORAL_TABLET | Freq: Every day | ORAL | 1 refills | Status: DC

## 2018-09-16 MED ORDER — DULOXETINE HCL 30 MG PO CPEP: ORAL_CAPSULE | ORAL | 1 refills | Status: DC

## 2018-09-16 NOTE — Assessment & Plan Note (Signed)
Following with pain management Taking tramadol 50 mg twice daily Taking cymbalta continue

## 2018-09-16 NOTE — Assessment & Plan Note (Signed)
BP Readings from Last 3 Encounters:  09/16/18 (!) 162/78  08/14/18 130/84  07/24/18 122/82    BP elevated here today - did not take her medications this morning Continue current medications at current doses

## 2018-09-16 NOTE — Assessment & Plan Note (Signed)
Lab Results  Component Value Date   HGBA1C 6.1 08/14/2018   Well controlled Will dec metformin to once a day - sometimes forgets to eat and is symptomatic - stressed eating regularly F/u in 6 months

## 2018-09-16 NOTE — Assessment & Plan Note (Signed)
Weight stable She has monitored her weight at home - stable Increase food intake -continue to monitor Follow up in 6 months - sooner if weight drops

## 2018-10-11 ENCOUNTER — Other Ambulatory Visit: Payer: Self-pay | Admitting: Internal Medicine

## 2018-11-13 ENCOUNTER — Ambulatory Visit (INDEPENDENT_AMBULATORY_CARE_PROVIDER_SITE_OTHER): Payer: Medicare Other | Admitting: Internal Medicine

## 2018-11-13 ENCOUNTER — Encounter: Payer: Self-pay | Admitting: Internal Medicine

## 2018-11-13 VITALS — BP 132/78 | HR 77 | Temp 98.6°F | Resp 16 | Ht 63.0 in | Wt 132.1 lb

## 2018-11-13 DIAGNOSIS — J209 Acute bronchitis, unspecified: Secondary | ICD-10-CM

## 2018-11-13 MED ORDER — AZITHROMYCIN 250 MG PO TABS: ORAL_TABLET | ORAL | 0 refills | Status: DC

## 2018-11-13 MED ORDER — HYDROCODONE-HOMATROPINE 5-1.5 MG/5ML PO SYRP: 5.0000 mL | ORAL_SOLUTION | Freq: Three times a day (TID) | ORAL | 0 refills | Status: DC | PRN

## 2018-11-13 NOTE — Assessment & Plan Note (Signed)
Likely bacterial  Start zpak Prescription cough syrup otc cold medications Rest, fluid Call if no improvement

## 2018-11-13 NOTE — Progress Notes (Signed)
Subjective:    Patient ID: Theresa Cline, female    DOB: [birth year], 68 y.o.   MRN: [id number]  HPI She is here for an acute visit for cold symptoms.  Her symptoms started one week ago. She was around a sick child.   She is experiencing productive cough, sob, and wheeze.  Her appetite is low.  She denies fever/chills, sinus pain, nasal congestion, sore throat, GI symptoms and headaches.    She has taken mucinex w/o improvement in her symptoms  Medications and allergies reviewed with patient and updated if appropriate.  Patient Active Problem List   Diagnosis Date Noted  . Weight loss 08/14/2018  . Left shoulder pain 08/14/2018  . COPD GOLD III still smoking 11/12/2017  . Cigarette smoker 09/26/2017  . Abnormal chest xray 08/22/2017  . History of renal cell carcinoma 02/05/2017  . Dyspnea on exertion 07/29/2016  . Lightheadedness 07/29/2016  . DM type 2, controlled, with complication ([place]) 02/58/5277  . Osteopenia 03/15/2016  . Facet arthropathy, lumbosacral 02/24/2016  . GERD (gastroesophageal reflux disease) 02/09/2016  . Acoustic neuroma ([place]) 02/09/2016  . Right knee DJD 01/18/2012  . Trochanteric bursitis of both hips 12/11/2011  . Spinal stenosis, lumbar region, without neurogenic claudication 12/11/2011  . Syncope 07/17/2011  . Dizziness and giddiness 11/06/2008  . Diabetic neuropathy associated with type 2 diabetes mellitus ([place]) 10/15/2007  . Mixed hyperlipidemia 10/15/2007  . Essential hypertension 10/15/2007  . Coronary atherosclerosis 10/15/2007  . Osteoarthrosis, unspecified whether generalized or localized, involving lower leg 10/15/2007    Current Outpatient Medications on File Prior to Visit  Medication Sig Dispense Refill  . ACCU-CHEK FASTCLIX LANCETS MISC USE TO TEST BLOOD SUGAR THREE TIMES A DAY AND EVERY NIGHT AT BEDTIME 102 each 2  . ACCU-CHEK SMARTVIEW test strip USE TO TEST BLOOD SUGAR THREE TIMES A DAY AND EVERY NIGHT AT BEDTIME 100 each 1    . aspirin EC 81 MG tablet Take 81 mg by mouth at bedtime.     Marland Kitchen atorvastatin (LIPITOR) 40 MG tablet TAKE 1 TABLET BY MOUTH ONCE DAILY 90 tablet 1  . BIOTIN PO Take 1 tablet by mouth daily with breakfast.    . Cholecalciferol (VITAMIN D) 2000 UNITS CAPS Take 2,000 Units by mouth daily with breakfast.     . diclofenac sodium (VOLTAREN) 1 % GEL Apply 4 g topically 4 (four) times daily. 100 g 5  . DULoxetine (CYMBALTA) 30 MG capsule TAKE 1 CAPSULE BY MOUTH ONCE DAILY 90 capsule 1  . hydrALAZINE (APRESOLINE) 25 MG tablet TAKE 3 TABLETS BY MOUTH THREE TIMES A DAY 810 tablet 1  . lidocaine (XYLOCAINE) 5 % ointment APPLY TOPICALLY THREE TIMES A DAY AS NEEDED 100 g 1  . losartan-hydrochlorothiazide (HYZAAR) 100-25 MG tablet TAKE 1 TABLET BY MOUTH ONCE DAILY 228 tablet 1  . metFORMIN (GLUCOPHAGE) 500 MG tablet Take 1 tablet (500 mg total) by mouth daily with breakfast. 90 tablet 1  . metoprolol succinate (TOPROL-XL) 100 MG 24 hr tablet TAKE 1/2 TABLET BY MOUTH TWICE DAILY. TAKE WITH OR IMMEDIATELY FOLLOWING A MEAL 90 tablet 1  . Multiple Vitamin (MULTI-VITAMIN DAILY PO) Take 1 tablet by mouth daily.    . nitroGLYCERIN (NITROLINGUAL) 0.4 MG/SPRAY spray Place 1 spray under the tongue every 5 (five) minutes x 3 doses as needed for chest pain. 12 g 1  . omeprazole (PRILOSEC) 20 MG capsule TAKE 1 CAPSULE BY MOUTH EVERY DAY 90 capsule 1  . PROAIR HFA 108 (90 Base)  MCG/ACT inhaler INHALE 2 PUFFS BY MOUTH EVERY 6 HOURS AS NEEDED FOR WHEEZING OR SHORTNESS OF BREATH 17 each 1  . traMADol (ULTRAM) 50 MG tablet Take 1 tablet (50 mg total) by mouth 2 (two) times daily. 60 tablet 3  . vitamin E (VITAMIN E) 400 UNIT capsule Take 400 Units by mouth daily.     No current facility-administered medications on file prior to visit.     Past Medical History:  Diagnosis Date  . Anemia, iron deficiency: Severe 07/29/2016  . Anxiety   . Bronchitis, acute   . CAD (coronary artery disease)   . Cancer [facility])    Renal s/p  nephrectomy  . CARPAL TUNNEL RELEASE, BILATERAL, HX OF 08/09/1998   Qualifier: Diagnosis of  By: Amil Amen MD, Benjamine Mola    . Chronic pain syndrome   . Diabetes mellitus type II, controlled ([place])   . Diabetes type 2, controlled ([place])   . Diabetic peripheral neuropathy ([place])   . Encounter for long-term (current) use of other medications   . GERD (gastroesophageal reflux disease)   . Hypertension   . 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 Mixed hyperlipidemia   . Obesity   . Osteoporosis   . Primary osteoarthritis of both knees   . S/P carpal tunnel release   . Sleep apnea    does not use CPAP  . Tobacco abuse   . Vertigo     Past Surgical History:  Procedure Laterality Date  . BREAST LUMPECTOMY Left 2017   benign  . BREAST LUMPECTOMY WITH RADIOACTIVE SEED LOCALIZATION Left 11/30/2015   Procedure: BREAST LUMPECTOMY WITH RADIOACTIVE SEED LOCALIZATION;  Surgeon: Erroll Luna, MD;  Location: [place] [facility];  Service: General;  Laterality: Left;  . CARPAL TUNNEL RELEASE Right 08/09/1998  . CARPAL TUNNEL RELEASE Left 09/08/1998  . CESAREAN SECTION     x3  . CORONARY ANGIOPLASTY WITH STENT PLACEMENT  2004   Normal LV function  . LIPOMA EXCISION Right    Right foot  . Resection of Left Acoustic Neuroma  1999  . TOTAL ABDOMINAL HYSTERECTOMY W/ BILATERAL SALPINGOOPHORECTOMY  04/12/1988   For fibroid tumors; [place], [place]    Social History   Socioeconomic History  . Marital status: Divorced    Spouse name: Not on file  . Number of children: 1  . Years of education: Not on file  . Highest education level: Not on file  Occupational History  . Occupation: Barrister's clerk in hotel    Comment: Disabled  . Occupation: Retail    Comment: Disabled  Social Needs  . Financial resource strain: Not very hard  . Food insecurity:    Worry: Never true    Inability: Never true  . Transportation needs:    Medical: No    Non-medical: No  Tobacco Use  . Smoking status: Former  Smoker    Packs/day: 1.50    Years: 50.00    Pack years: 75.00    Types: Cigarettes    Last attempt to quit: 11/05/2017    Years since quitting: 1.0  . Smokeless tobacco: Never Used  . Tobacco comment: 09/26/17- smoking 1 ppwk//lmr  Substance and Sexual Activity  . Alcohol use: No    Alcohol/week: 0.0 standard drinks  . Drug use: No  . Sexual activity: Never  Lifestyle  . Physical activity:    Days per week: 4 days    Minutes per session: 40 min  . Stress: Not at all  Relationships  .  Social connections:    Talks on phone: More than three times a week    Gets together: More than three times a week    Attends religious service: 1 to 4 times per year    Active member of club or organization: Yes    Attends meetings of clubs or organizations: More than 4 times per year    Relationship status: Divorced  Other Topics Concern  . Not on file  Social History Narrative   Divorced   Disability mainly for back and chronic intermittent vertigo - since tumor removed - no surgical correction for back   Lives alone, keeps her grandson, 78 yr old daughter is a mother    Family History  Problem Relation Age of Onset  . Leukemia Mother        CLL  . Diabetes type II Mother   . Other Mother        Hypercholesterolemia  . Osteoarthritis Mother   . COPD Mother   . Hypertension Mother   . Hodgkin's lymphoma Father   . Diabetes type II Sister   . Osteoporosis Sister   . Paranoid behavior Brother        Unsure if diagnosed with Schizophrenia  . Diabetes type II Sister   . Hypertension Sister   . Diabetes type II Sister   . Hypertension Sister   . Schizophrenia Sister   . Heart disease Sister        Enlarged heart  . Glaucoma Sister   . Alcohol abuse Sister   . Glaucoma Sister   . Alcohol abuse Sister   . Bipolar disorder Daughter   . Lupus Daughter        SLE  . Healthy Daughter     Review of Systems  Constitutional: Negative for chills and fever.  HENT: Negative for  congestion, ear pain, sinus pain and sore throat.   Respiratory: Positive for cough (productive), shortness of breath and wheezing.   Gastrointestinal: Negative for diarrhea and nausea.  Musculoskeletal: Negative for myalgias.  Neurological: Negative for light-headedness and headaches.       Objective:   Vitals:   11/13/18 0802  BP: 132/78  Pulse: 77  Resp: 16  Temp: 98.6 F (37 C)  SpO2: 96%   Filed Weights   11/13/18 0802  Weight: 132 lb 1.9 oz (59.9 kg)   Body mass index is 23.4 kg/m.  Wt Readings from Last 3 Encounters:  11/13/18 132 lb 1.9 oz (59.9 kg)  09/16/18 126 lb 12.8 oz (57.5 kg)  08/14/18 126 lb (57.2 kg)     Physical Exam GENERAL APPEARANCE: Appears stated age, well appearing, NAD EYES: conjunctiva clear, no icterus HEENT: bilateral tympanic membranes and ear canals normal, oropharynx with no erythema, no thyromegaly, trachea midline, no cervical or supraclavicular lymphadenopathy LUNGS: unlabored breathing, good air entry bilaterally, fine wheeze on occasion with expiration, no crackles CARDIOVASCULAR: Normal S1,S2 without murmurs, no edema SKIN: warm, dry        Assessment & Plan:   See Problem List for Assessment and Plan of chronic medical problems.

## 2018-11-13 NOTE — Patient Instructions (Signed)
Take the antibiotic as prescribed - complete the entire course.  Use the cough syrup as needed.  Continue over the counter cold medication, advil and tylenol.  Increase your fluids and rest.    Call if no improvement    

## 2018-11-28 DIAGNOSIS — E103293 Type 1 diabetes mellitus with mild nonproliferative diabetic retinopathy without macular edema, bilateral: Secondary | ICD-10-CM | POA: Diagnosis not present

## 2018-11-28 LAB — HM DIABETES EYE EXAM

## 2018-11-29 ENCOUNTER — Encounter: Payer: Self-pay | Admitting: Internal Medicine

## 2018-11-29 ENCOUNTER — Ambulatory Visit: Payer: Medicare Other | Admitting: Podiatry

## 2018-12-09 ENCOUNTER — Other Ambulatory Visit: Payer: Self-pay | Admitting: Internal Medicine

## 2018-12-09 ENCOUNTER — Other Ambulatory Visit: Payer: Self-pay | Admitting: Physical Medicine & Rehabilitation

## 2018-12-10 ENCOUNTER — Encounter: Payer: Self-pay | Admitting: Internal Medicine

## 2018-12-11 ENCOUNTER — Ambulatory Visit (INDEPENDENT_AMBULATORY_CARE_PROVIDER_SITE_OTHER): Payer: Medicare Other | Admitting: Podiatry

## 2018-12-11 ENCOUNTER — Encounter: Payer: Self-pay | Admitting: Podiatry

## 2018-12-11 DIAGNOSIS — E1149 Type 2 diabetes mellitus with other diabetic neurological complication: Secondary | ICD-10-CM

## 2018-12-11 DIAGNOSIS — M2040 Other hammer toe(s) (acquired), unspecified foot: Secondary | ICD-10-CM | POA: Diagnosis not present

## 2018-12-11 DIAGNOSIS — E1140 Type 2 diabetes mellitus with diabetic neuropathy, unspecified: Secondary | ICD-10-CM | POA: Diagnosis not present

## 2018-12-11 DIAGNOSIS — M2010 Hallux valgus (acquired), unspecified foot: Secondary | ICD-10-CM

## 2018-12-11 NOTE — Progress Notes (Signed)
This patient presents to the office with chief complaint of long thick nails and diabetic feet.  This patient  says there  is  no pain and discomfort in her  feet.  This patient says there are long thick painful nails.  These nails are painful walking and wearing shoes.  Patient has no history of infection or drainage from both feet.  Patient is unable to  self treat his own nails . This patient presents  to the office today for treatment of the  long nails and a foot evaluation due to history of  Diabetes. She presents to the office for her annual diabetic exam.  General Appearance  Alert, conversant and in no acute stress.  Vascular  Dorsalis pedis  are palpable  bilaterally.  Posterior tibial pulses are weak due to ankle swelling.   Capillary return is within normal limits  bilaterally. Temperature is within normal limits  bilaterally.  Neurologic  Senn-Weinstein monofilament wire test within normal limits  bilaterally. Muscle power within normal limits bilaterally.  Nails Thick disfigured discolored nails with subungual debris  from hallux to fifth toes bilaterally. No evidence of bacterial infection or drainage bilaterally.  Orthopedic  No limitations of motion of motion feet .  No crepitus or effusions noted. HAV 1st MPJ  B/L.  Hammer toes second  B/l..  Skin  normotropic skin with no porokeratosis noted bilaterally.  No signs of infections or ulcers noted.      Diabetes with no complications.  HAV and hammer toe  B/L.  IE  Debride nails x 10.  A diabetic foot exam was performed and there is no evidence of any vascular or neurologic pathology.  Discussed her bunion and hammer toe pathology.    RTC 1 year.   Gardiner Barefoot DPM

## 2018-12-16 DIAGNOSIS — H43812 Vitreous degeneration, left eye: Secondary | ICD-10-CM | POA: Diagnosis not present

## 2018-12-16 DIAGNOSIS — H25812 Combined forms of age-related cataract, left eye: Secondary | ICD-10-CM | POA: Diagnosis not present

## 2018-12-16 DIAGNOSIS — H25811 Combined forms of age-related cataract, right eye: Secondary | ICD-10-CM | POA: Diagnosis not present

## 2018-12-16 DIAGNOSIS — Z01818 Encounter for other preprocedural examination: Secondary | ICD-10-CM | POA: Diagnosis not present

## 2018-12-20 ENCOUNTER — Other Ambulatory Visit: Payer: Self-pay

## 2018-12-20 ENCOUNTER — Encounter: Payer: Self-pay | Admitting: Physical Medicine & Rehabilitation

## 2018-12-20 ENCOUNTER — Ambulatory Visit (HOSPITAL_BASED_OUTPATIENT_CLINIC_OR_DEPARTMENT_OTHER): Payer: Medicare Other | Admitting: Physical Medicine & Rehabilitation

## 2018-12-20 ENCOUNTER — Encounter: Payer: Medicare Other | Attending: Physical Medicine & Rehabilitation

## 2018-12-20 VITALS — BP 137/87 | HR 72 | Ht 63.0 in | Wt 130.0 lb

## 2018-12-20 DIAGNOSIS — Z6823 Body mass index (BMI) 23.0-23.9, adult: Secondary | ICD-10-CM | POA: Insufficient documentation

## 2018-12-20 DIAGNOSIS — E1142 Type 2 diabetes mellitus with diabetic polyneuropathy: Secondary | ICD-10-CM | POA: Insufficient documentation

## 2018-12-20 DIAGNOSIS — I10 Essential (primary) hypertension: Secondary | ICD-10-CM | POA: Diagnosis not present

## 2018-12-20 DIAGNOSIS — M48061 Spinal stenosis, lumbar region without neurogenic claudication: Secondary | ICD-10-CM | POA: Insufficient documentation

## 2018-12-20 DIAGNOSIS — Z833 Family history of diabetes mellitus: Secondary | ICD-10-CM | POA: Diagnosis not present

## 2018-12-20 DIAGNOSIS — Z8262 Family history of osteoporosis: Secondary | ICD-10-CM | POA: Insufficient documentation

## 2018-12-20 DIAGNOSIS — Z87891 Personal history of nicotine dependence: Secondary | ICD-10-CM | POA: Insufficient documentation

## 2018-12-20 DIAGNOSIS — E669 Obesity, unspecified: Secondary | ICD-10-CM | POA: Diagnosis not present

## 2018-12-20 DIAGNOSIS — G4730 Sleep apnea, unspecified: Secondary | ICD-10-CM | POA: Insufficient documentation

## 2018-12-20 DIAGNOSIS — Z8249 Family history of ischemic heart disease and other diseases of the circulatory system: Secondary | ICD-10-CM | POA: Diagnosis not present

## 2018-12-20 NOTE — Patient Instructions (Addendum)
Try regular tylenol 2 tablets during the day and tylenol pm 1 tablet at night You can also try a benadryl 25mg  tablet with a tylenol at night  Call if this is not helpful and we can resume tramadol  Use can in home as well as out of house

## 2018-12-20 NOTE — Progress Notes (Signed)
Subjective:    Patient ID: Theresa Cline, female    DOB: 06-21-1951, 68 y.o.   MRN: [id number]  HPI 68 year old female with history of lumbar spinal stenosis.  She has lost weight and started exercising and is overall doing much better. She has decided not to move to [place] Current pain medications Tramadol 50mg   BID Pt feels like she may not need this anymore Poor sleep denies anxiety, bladder wakes her with nocturia [place] this am but no LOC.  Occ rectal pain, discussed f/u with PCP +/- gastroenterology Denies bowel problems   Pain Inventory Average Pain 0 Pain Right Now 0 My pain is na  In the last 24 hours, has pain interfered with the following? General activity 0 Relation with others 0 Enjoyment of life 0 What TIME of day is your pain at its worst? na Sleep (in general) Poor  Pain is worse with: na Pain improves with: na Relief from Meds: na  Mobility use a cane ability to climb steps?  no do you drive?  no  Function retired  Neuro/Psych numbness trouble walking dizziness  Prior Studies Any changes since last visit?  no  Physicians involved in your care Any changes since last visit?  no   Family History  Problem Relation Age of Onset  . Leukemia Mother        CLL  . Diabetes type II Mother   . Other Mother        Hypercholesterolemia  . Osteoarthritis Mother   . COPD Mother   . Hypertension Mother   . Hodgkin's lymphoma Father   . Diabetes type II Sister   . Osteoporosis Sister   . Paranoid behavior Brother        Unsure if diagnosed with Schizophrenia  . Diabetes type II Sister   . Hypertension Sister   . Diabetes type II Sister   . Hypertension Sister   . Schizophrenia Sister   . Heart disease Sister        Enlarged heart  . Glaucoma Sister   . Alcohol abuse Sister   . Glaucoma Sister   . Alcohol abuse Sister   . Bipolar disorder Daughter   . Lupus Daughter        SLE  . Healthy Daughter    Social History    Socioeconomic History  . Marital status: Divorced    Spouse name: Not on file  . Number of children: 1  . Years of education: Not on file  . Highest education level: Not on file  Occupational History  . Occupation: Barrister's clerk in hotel    Comment: Disabled  . Occupation: Retail    Comment: Disabled  Social Needs  . Financial resource strain: Not very hard  . Food insecurity:    Worry: Never true    Inability: Never true  . Transportation needs:    Medical: No    Non-medical: No  Tobacco Use  . Smoking status: Former Smoker    Packs/day: 1.50    Years: 50.00    Pack years: 75.00    Types: Cigarettes    Last attempt to quit: 11/05/2017    Years since quitting: 1.1  . Smokeless tobacco: Never Used  . Tobacco comment: 09/26/17- smoking 1 ppwk//lmr  Substance and Sexual Activity  . Alcohol use: No    Alcohol/week: 0.0 standard drinks  . Drug use: No  . Sexual activity: Never  Lifestyle  . Physical activity:    Days per week: 4 days  Minutes per session: 40 min  . Stress: Not at all  Relationships  . Social connections:    Talks on phone: More than three times a week    Gets together: More than three times a week    Attends religious service: 1 to 4 times per year    Active member of club or organization: Yes    Attends meetings of clubs or organizations: More than 4 times per year    Relationship status: Divorced  Other Topics Concern  . Not on file  Social History Narrative   Divorced   Disability mainly for back and chronic intermittent vertigo - since tumor removed - no surgical correction for back   Lives alone, keeps her grandson, 31 yr old daughter is a mother   Past Surgical History:  Procedure Laterality Date  . BREAST LUMPECTOMY Left 2017   benign  . BREAST LUMPECTOMY WITH RADIOACTIVE SEED LOCALIZATION Left 11/30/2015   Procedure: BREAST LUMPECTOMY WITH RADIOACTIVE SEED LOCALIZATION;  Surgeon: Erroll Luna, MD;  Location: [facility];   Service: General;  Laterality: Left;  . CARPAL TUNNEL RELEASE Right 08/09/1998  . CARPAL TUNNEL RELEASE Left 09/08/1998  . CESAREAN SECTION     x3  . CORONARY ANGIOPLASTY WITH STENT PLACEMENT  2004   Normal LV function  . LIPOMA EXCISION Right    Right foot  . Resection of Left Acoustic Neuroma  1999  . TOTAL ABDOMINAL HYSTERECTOMY W/ BILATERAL SALPINGOOPHORECTOMY  04/12/1988   For fibroid tumors; [place], [place]   Past Medical History:  Diagnosis Date  . Anemia, iron deficiency: Severe 07/29/2016  . Anxiety   . Bronchitis, acute   . CAD (coronary artery disease)   . Cancer [facility])    Renal s/p nephrectomy  . CARPAL TUNNEL RELEASE, BILATERAL, HX OF 08/09/1998   Qualifier: Diagnosis of  By: Amil Amen MD, Benjamine Mola    . Chronic pain syndrome   . Diabetes mellitus type II, controlled ([place])   . Diabetes type 2, controlled ([place])   . Diabetic peripheral neuropathy ([place])   . Encounter for long-term (current) use of other medications   . GERD (gastroesophageal reflux disease)   . Hypertension   . 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 Mixed hyperlipidemia   . Obesity   . Osteoporosis   . Primary osteoarthritis of both knees   . S/P carpal tunnel release   . Sleep apnea    does not use CPAP  . Tobacco abuse   . Vertigo    BP 137/87   Pulse 72   Ht 5\' 3"  (1.6 m)   Wt 130 lb (59 kg)   SpO2 95%   BMI 23.03 kg/m   Opioid Risk Score:   Fall Risk Score:  `1  Depression screen PHQ 2/9  Depression screen [facility] 2/9 07/24/2018 06/21/2018 07/04/2017 03/21/2017 07/28/2016 05/06/2015 09/25/2014  Decreased Interest 0 0 0 0 0 0 0  Down, Depressed, Hopeless 0 0 0 0 0 0 0  PHQ - 2 Score 0 0 0 0 0 0 0  Altered sleeping - - - - - 3 -  Tired, decreased energy - - - - - 0 -  Change in appetite - - - - - 3 -  Feeling bad or failure about yourself  - - - - - 0 -  Trouble concentrating - - - - - 2 -  Moving slowly or fidgety/restless - - - - - 0 -  Suicidal thoughts - - - - -  0 -  PHQ-9 Score  - - - - - 8 -  Some recent data might be hidden     Review of Systems  Constitutional: Negative.   HENT: Negative.   Eyes: Negative.   Respiratory: Positive for apnea.   Cardiovascular: Negative.   Gastrointestinal: Negative.   Endocrine: Negative.   Genitourinary: Negative.   Musculoskeletal: Negative.   Skin: Negative.   Allergic/Immunologic: Negative.   Neurological: Positive for dizziness and numbness.  Hematological: Negative.   Psychiatric/Behavioral: The patient is nervous/anxious.   All other systems reviewed and are negative.      Objective:   Physical Exam Vitals signs and nursing note reviewed.  Constitutional:      Appearance: Normal appearance.  HENT:     Head: Normocephalic and atraumatic.     Nose: No congestion or rhinorrhea.     Mouth/Throat:     Mouth: Mucous membranes are moist.     Pharynx: Oropharynx is clear.  Eyes:     Extraocular Movements: Extraocular movements intact.     Conjunctiva/sclera: Conjunctivae normal.     Pupils: Pupils are equal, round, and reactive to light.  Musculoskeletal:        General: No swelling or tenderness.  Neurological:     General: No focal deficit present.     Mental Status: She is alert and oriented to person, place, and time.     Sensory: No sensory deficit.  Psychiatric:        Mood and Affect: Mood normal.        Behavior: Behavior normal.   Motor strength is 5/5 bilateral deltoid bicep tricep grip hip flexor knee extensor ankle dorsiflexion Gait uses a quad cane no evidence of toe drag or knee instability. Negative straight leg raising Lumbar mobility 75% flexion extension lateral bending and rotation.         Assessment & Plan:  1.  History of lumbar spinal stenosis overall doing very well in terms of her pain complaints she has had a couple falls in the last 2 weeks she has had no loss of consciousness she just loses her balance.  We discussed that she should use her cane inside the house and not  just when she goes outside.  She will continue with her home exercise program.  She does not have any vestibular symptoms to necessitate referral to vestibular therapy.  She is walking 30 minutes/day for exercise. We will discontinue her tramadol this could certainly contribute to fall risk.  Her pain is fairly well controlled and may be able to get by with Tylenol only.  We discussed at night she could try taking a Benadryl 25 mg to help with sleep or by Tylenol p.m. for nighttime use Physical medicine and rehab follow-up on PRN basis

## 2019-02-05 ENCOUNTER — Other Ambulatory Visit: Payer: Self-pay

## 2019-02-05 NOTE — Patient Outreach (Signed)
[facility]) Care Management  02/05/2019  Theresa Cline [birth year] [id number]   Medication Adherence call to Theresa Cline Compliant Voice message left with a call back number. Theresa Cline is showing past due under [place].   [facility] Management Direct Dial [phone]  Fax [phone] [email or web address]

## 2019-02-06 ENCOUNTER — Other Ambulatory Visit: Payer: Self-pay | Admitting: Internal Medicine

## 2019-03-07 ENCOUNTER — Other Ambulatory Visit: Payer: Self-pay | Admitting: Internal Medicine

## 2019-03-07 ENCOUNTER — Other Ambulatory Visit: Payer: Self-pay | Admitting: Physical Medicine & Rehabilitation

## 2019-03-19 NOTE — Progress Notes (Signed)
Subjective:    Patient ID: Theresa Cline, female    DOB: Aug 28, 1951, 68 y.o.   MRN: [id number]  HPI The patient is here for follow up.  She is exercising regularly -  Mini bike - on the floor.     Hypertension: She is taking her medication daily. She is compliant with a low sodium diet.  She denies chest pain, palpitations, edema, shortness of breath and regular headaches. She does monitor her blood pressure at home - 130's/70's.    Diabetes with neuropathy: She is taking her medication daily as prescribed. She is fairly compliant with a diabetic diet.  She monitors her sugars and they have been running 70-low 100's. She has numbness/tingling in her feet and denies foot lesions. She is up-to-date with an ophthalmology examination.   Hyperlipidemia: She is taking her medication daily. She is compliant with a low fat/cholesterol diet. She denies myalgias.   GERD:  She is taking her medication daily as prescribed.  She denies any GERD symptoms and feels her GERD is well controlled.  She is unsure if she still needs medication on a daily basis.  Spinal stenosis:  She follows with pain mangmenet and takes tramadol.  She takes cymbalta.  She takes the tramadol prn only.  Her pain is intermittent.    COPD: She does have shortness of breath with exertion.  She also has some coughing and wheezing.  She does use her albuterol inhaler 3-4 times a week and it does help.  She is never been on a daily inhaler.  She is still smoking and knows that she should stop.  She does exercise regularly with a mini bike on the floor, but is walking less.   Medications and allergies reviewed with patient and updated if appropriate.  Patient Active Problem List   Diagnosis Date Noted  . Weight loss 08/14/2018  . Left shoulder pain 08/14/2018  . COPD GOLD III still smoking 11/12/2017  . Cigarette smoker 09/26/2017  . Abnormal chest xray 08/22/2017  . History of renal cell carcinoma 02/05/2017  . Dyspnea  on exertion 07/29/2016  . Lightheadedness 07/29/2016  . DM type 2, controlled, with complication ([place]) 23/76/2831  . Osteopenia 03/15/2016  . Facet arthropathy, lumbosacral 02/24/2016  . GERD (gastroesophageal reflux disease) 02/09/2016  . 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 Right knee DJD 01/18/2012  . Trochanteric bursitis of both hips 12/11/2011  . Spinal stenosis, lumbar region, without neurogenic claudication 12/11/2011  . Syncope 07/17/2011  . Dizziness and giddiness 11/06/2008  . 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 Mixed hyperlipidemia 10/15/2007  . Essential hypertension 10/15/2007  . Coronary atherosclerosis 10/15/2007  . Osteoarthrosis, unspecified whether generalized or localized, involving lower leg 10/15/2007    Current Outpatient Medications on File Prior to Visit  Medication Sig Dispense Refill  . ACCU-CHEK FASTCLIX LANCETS MISC USE TO TEST BLOOD SUGAR THREE TIMES A DAY AND EVERY NIGHT AT BEDTIME 102 each 1  . ACCU-CHEK SMARTVIEW test strip USE TO TEST BLOOD SUGAR THREE TIMES A DAY AND EVERY NIGHT AT BEDTIME 100 each 1  . aspirin EC 81 MG tablet Take 81 mg by mouth at bedtime.     Marland Kitchen atorvastatin (LIPITOR) 40 MG tablet TAKE 1 TABLET BY MOUTH ONCE DAILY 90 tablet 1  . BIOTIN PO Take 1 tablet by mouth daily with breakfast.    . Cholecalciferol (VITAMIN D) 2000 UNITS CAPS Take 2,000 Units by mouth daily with breakfast.     .  diclofenac sodium (VOLTAREN) 1 % GEL Apply 4 g topically 4 (four) times daily. 100 g 5  . DULoxetine (CYMBALTA) 30 MG capsule TAKE 1 CAPSULE BY MOUTH ONCE DAILY 90 capsule 1  . hydrALAZINE (APRESOLINE) 25 MG tablet TAKE 3 TABLETS BY MOUTH THREE TIMES A DAY 810 tablet 1  . lidocaine (XYLOCAINE) 5 % ointment APPLY TOPICALLY THREE TIMES A DAY AS NEEDED 100 g 1  . losartan-hydrochlorothiazide (HYZAAR) 100-25 MG tablet Take 1 tablet by mouth daily. Need OV for more refills. 30 tablet 0  . metoprolol succinate  (TOPROL-XL) 100 MG 24 hr tablet TAKE 1/2 TABLET BY MOUTH TWICE DAILY. TAKE WITH OR IMMEDIATELY FOLLOWING A MEAL 90 tablet 1  . Multiple Vitamin (MULTI-VITAMIN DAILY PO) Take 1 tablet by mouth daily.    Marland Kitchen omeprazole (PRILOSEC) 20 MG capsule TAKE 1 CAPSULE BY MOUTH EVERY DAY 90 capsule 3  . PROAIR HFA 108 (90 Base) MCG/ACT inhaler INHALE 2 PUFFS BY MOUTH EVERY 6 HOURS AS NEEDED FOR WHEEZING OR SHORTNESS OF BREATH 17 each 1  . traMADol (ULTRAM) 50 MG tablet TAKE 1 TABLET BY MOUTH 2 TIMES A DAY 60 tablet 2  . vitamin E (VITAMIN E) 400 UNIT capsule Take 400 Units by mouth daily.    Marland Kitchen XIIDRA 5 % SOLN      No current facility-administered medications on file prior to visit.     Past Medical History:  Diagnosis Date  . Anemia, iron deficiency: Severe 07/29/2016  . Anxiety   . Bronchitis, acute   . CAD (coronary artery disease)   . Cancer [facility])    Renal s/p nephrectomy  . CARPAL TUNNEL RELEASE, BILATERAL, HX OF 08/09/1998   Qualifier: Diagnosis of  By: Amil Amen MD, Benjamine Mola    . Chronic pain syndrome   . Diabetes mellitus type II, controlled ([place])   . Diabetes type 2, controlled ([place])   . Diabetic peripheral neuropathy ([place])   . Encounter for long-term (current) use of other medications   . GERD (gastroesophageal reflux disease)   . Hypertension   . 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 Mixed hyperlipidemia   . Obesity   . Osteoporosis   . Primary osteoarthritis of both knees   . S/P carpal tunnel release   . Sleep apnea    does not use CPAP  . Tobacco abuse   . Vertigo     Past Surgical History:  Procedure Laterality Date  . BREAST LUMPECTOMY Left 2017   benign  . BREAST LUMPECTOMY WITH RADIOACTIVE SEED LOCALIZATION Left 11/30/2015   Procedure: BREAST LUMPECTOMY WITH RADIOACTIVE SEED LOCALIZATION;  Surgeon: Erroll Luna, MD;  Location: [facility];  Service: General;  Laterality: Left;  . CARPAL TUNNEL RELEASE Right 08/09/1998  . CARPAL TUNNEL RELEASE Left  09/08/1998  . CESAREAN SECTION     x3  . CORONARY ANGIOPLASTY WITH STENT PLACEMENT  2004   Normal LV function  . LIPOMA EXCISION Right    Right foot  . Resection of Left Acoustic Neuroma  1999  . TOTAL ABDOMINAL HYSTERECTOMY W/ BILATERAL SALPINGOOPHORECTOMY  04/12/1988   For fibroid tumors; [place], [place]    Social History   Socioeconomic History  . Marital status: Divorced    Spouse name: Not on file  . Number of children: 1  . Years of education: Not on file  . Highest education level: Not on file  Occupational History  . Occupation: Barrister's clerk in hotel    Comment: Disabled  .  Occupation: Retail    Comment: Disabled  Social Needs  . Financial resource strain: Not very hard  . Food insecurity    Worry: Never true    Inability: Never true  . Transportation needs    Medical: No    Non-medical: No  Tobacco Use  . Smoking status: Current Every Day Smoker    Packs/day: 1.50    Years: 50.00    Pack years: 75.00    Types: Cigarettes    Last attempt to quit: 11/05/2017    Years since quitting: 1.3  . Smokeless tobacco: Never Used  . Tobacco comment: 09/26/17- smoking 1 ppwk//lmr  Substance and Sexual Activity  . Alcohol use: No    Alcohol/week: 0.0 standard drinks  . Drug use: No  . Sexual activity: Never  Lifestyle  . Physical activity    Days per week: 4 days    Minutes per session: 40 min  . Stress: Not at all  Relationships  . Social connections    Talks on phone: More than three times a week    Gets together: More than three times a week    Attends religious service: 1 to 4 times per year    Active member of club or organization: Yes    Attends meetings of clubs or organizations: More than 4 times per year    Relationship status: Divorced  Other Topics Concern  . Not on file  Social History Narrative   Divorced   Disability mainly for back and chronic intermittent vertigo - since tumor removed - no surgical correction for back   Lives alone, keeps her  grandson, 50 yr old daughter is a mother    Family History  Problem Relation Age of Onset  . Leukemia Mother        CLL  . Diabetes type II Mother   . Other Mother        Hypercholesterolemia  . Osteoarthritis Mother   . COPD Mother   . Hypertension Mother   . Hodgkin's lymphoma Father   . Diabetes type II Sister   . Osteoporosis Sister   . Paranoid behavior Brother        Unsure if diagnosed with Schizophrenia  . Diabetes type II Sister   . Hypertension Sister   . Diabetes type II Sister   . Hypertension Sister   . Schizophrenia Sister   . Heart disease Sister        Enlarged heart  . Glaucoma Sister   . Alcohol abuse Sister   . Glaucoma Sister   . Alcohol abuse Sister   . Bipolar disorder Daughter   . Lupus Daughter        SLE  . Healthy Daughter     Review of Systems  Constitutional: Negative for chills and fever.  Respiratory: Positive for cough (from smoking), shortness of breath and wheezing.   Cardiovascular: Negative for chest pain, palpitations and leg swelling.  Musculoskeletal: Negative for myalgias.  Neurological: Negative for light-headedness and headaches.       Objective:   Vitals:   03/20/19 0829  BP: (!) 150/90  Pulse: 63  Temp: 98.2 F (36.8 C)  SpO2: 95%   BP Readings from Last 3 Encounters:  03/20/19 (!) 150/90  12/20/18 137/87  11/13/18 132/78   Wt Readings from Last 3 Encounters:  03/20/19 125 lb (56.7 kg)  12/20/18 130 lb (59 kg)  11/13/18 132 lb 1.9 oz (59.9 kg)   Body mass index is 22.14 kg/m.  Physical Exam    Constitutional: Appears well-developed and well-nourished. No distress.  HENT:  Head: Normocephalic and atraumatic.  Neck: Neck supple. No tracheal deviation present. No thyromegaly present.  No cervical lymphadenopathy Cardiovascular: Normal rate, regular rhythm and normal heart sounds.   No murmur heard. No carotid bruit .  No edema Pulmonary/Chest: Effort normal and breath sounds normal. No respiratory  distress. No has no wheezes. No rales.  Skin: Skin is warm and dry. Not diaphoretic.  Psychiatric: Normal mood and affect. Behavior is normal.      Assessment & Plan:    See Problem List for Assessment and Plan of chronic medical problems.

## 2019-03-20 ENCOUNTER — Encounter: Payer: Self-pay | Admitting: Internal Medicine

## 2019-03-20 ENCOUNTER — Ambulatory Visit (INDEPENDENT_AMBULATORY_CARE_PROVIDER_SITE_OTHER): Payer: Medicare Other | Admitting: Internal Medicine

## 2019-03-20 ENCOUNTER — Other Ambulatory Visit: Payer: Self-pay

## 2019-03-20 ENCOUNTER — Other Ambulatory Visit (INDEPENDENT_AMBULATORY_CARE_PROVIDER_SITE_OTHER): Payer: Medicare Other

## 2019-03-20 VITALS — BP 150/90 | HR 63 | Temp 98.2°F | Ht 63.0 in | Wt 125.0 lb

## 2019-03-20 DIAGNOSIS — E118 Type 2 diabetes mellitus with unspecified complications: Secondary | ICD-10-CM

## 2019-03-20 DIAGNOSIS — E1141 Type 2 diabetes mellitus with diabetic mononeuropathy: Secondary | ICD-10-CM

## 2019-03-20 DIAGNOSIS — K219 Gastro-esophageal reflux disease without esophagitis: Secondary | ICD-10-CM | POA: Diagnosis not present

## 2019-03-20 DIAGNOSIS — M48061 Spinal stenosis, lumbar region without neurogenic claudication: Secondary | ICD-10-CM

## 2019-03-20 DIAGNOSIS — I10 Essential (primary) hypertension: Secondary | ICD-10-CM

## 2019-03-20 DIAGNOSIS — E782 Mixed hyperlipidemia: Secondary | ICD-10-CM

## 2019-03-20 DIAGNOSIS — J449 Chronic obstructive pulmonary disease, unspecified: Secondary | ICD-10-CM

## 2019-03-20 LAB — COMPREHENSIVE METABOLIC PANEL
ALT: 8 U/L (ref 0–35)
AST: 16 U/L (ref 0–37)
Albumin: 4.2 g/dL (ref 3.5–5.2)
Alkaline Phosphatase: 56 U/L (ref 39–117)
BUN: 17 mg/dL (ref 6–23)
CO2: 31 mEq/L (ref 19–32)
Calcium: 10.1 mg/dL (ref 8.4–10.5)
Chloride: 98 mEq/L (ref 96–112)
Creatinine, Ser: 0.97 mg/dL (ref 0.40–1.20)
GFR: 68.63 mL/min (ref >60.00)
Glucose, Bld: 82 mg/dL (ref 70–99)
Potassium: 3.7 mEq/L (ref 3.5–5.1)
Sodium: 137 mEq/L (ref 135–145)
Total Bilirubin: 0.4 mg/dL (ref 0.2–1.2)
Total Protein: 7.3 g/dL (ref 6.0–8.3)

## 2019-03-20 LAB — LIPID PANEL
Cholesterol: 130 mg/dL (ref 0–200)
HDL: 54.4 mg/dL (ref >39.00)
LDL Cholesterol: 67 mg/dL (ref 0–99)
NonHDL: 75.63
Total CHOL/HDL Ratio: 2
Triglycerides: 45 mg/dL (ref 0.0–149.0)
VLDL: 9 mg/dL (ref 0.0–40.0)

## 2019-03-20 LAB — HEMOGLOBIN A1C: Hgb A1c MFr Bld: 6.3 % (ref 4.6–6.5)

## 2019-03-20 MED ORDER — BREO ELLIPTA 100-25 MCG/INH IN AEPB: 1.0000 | INHALATION_SPRAY | Freq: Every day | RESPIRATORY_TRACT | 5 refills | Status: AC

## 2019-03-20 MED ORDER — AZELASTINE-FLUTICASONE 137-50 MCG/ACT NA SUSP: 1.0000 | Freq: Two times a day (BID) | NASAL | 5 refills | Status: AC

## 2019-03-20 MED ORDER — NITROGLYCERIN 0.4 MG/SPRAY TL SOLN: 1.0000 | 1 refills | Status: AC | PRN

## 2019-03-20 NOTE — Assessment & Plan Note (Signed)
Has seen pulmonary-can see them as needed Stressed smoking cessation Has intermittent cough, wheeze and shortness of breath with exertion Uses albuterol 3-4 times a week and it does help Recommend trial of daily inhaler-can try Breo to see if that helps with her shortness of breath with exertion Albuterol as needed

## 2019-03-20 NOTE — Assessment & Plan Note (Signed)
Continue Cymbalta She checks her feet daily

## 2019-03-20 NOTE — Patient Instructions (Addendum)
  Tests ordered today. Your results will be released to [facility] (or called to you) after review, usually within 72hours after test completion. If any changes need to be made, you will be notified at that same time.   Medications reviewed and updated.  Changes include :   Stop the metformin.  Continue to monitor your sugars daily.   Try Dymista nasal spray for your allergies.   Try Breo for your COPD/shortness of breath.     Your prescription(s) have been submitted to your pharmacy. Please take as directed and contact our office if you believe you are having problem(s) with the medication(s).  Please followup in 6 months

## 2019-03-20 NOTE — Assessment & Plan Note (Signed)
Controlled with daily omeprazole Advised to try to wean off slowly to see if she still needs it.

## 2019-03-20 NOTE — Assessment & Plan Note (Signed)
Check lipid panel  Continue daily statin Regular exercise and healthy diet encouraged  

## 2019-03-20 NOTE — Assessment & Plan Note (Signed)
Following with pain management Takes tramadol only as needed Takes Cymbalta daily Overall controlled

## 2019-03-20 NOTE — Assessment & Plan Note (Signed)
BP Readings from Last 3 Encounters:  03/20/19 (!) 150/90  12/20/18 137/87  11/13/18 132/78    BP controlled Current regimen effective and well tolerated Continue current medications at current doses cmp

## 2019-03-20 NOTE — Assessment & Plan Note (Signed)
Sugars very controlled at home Exercising regularly Will check a1c Will stop metformin and monitor off medication

## 2019-04-10 ENCOUNTER — Other Ambulatory Visit: Payer: Self-pay | Admitting: Internal Medicine

## 2019-04-10 ENCOUNTER — Other Ambulatory Visit: Payer: Self-pay | Admitting: Physical Medicine & Rehabilitation

## 2019-05-14 ENCOUNTER — Other Ambulatory Visit: Payer: Self-pay

## 2019-05-14 NOTE — Patient Outreach (Signed)
[facility]) Care Management  05/14/2019  Theresa Cline 1951-08-21 [id number]   Medication Adherence call to Theresa Cline Compliant Voice message left with a call back number. Theresa Cline is showing past due on Metformin 500 mg,Atorvastatin 40 mg and Losartan/Hctz 100/25 mg under [place].   [facility] Management Direct Dial [phone]  Fax [phone] [email or web address]

## 2019-06-09 ENCOUNTER — Other Ambulatory Visit: Payer: Self-pay

## 2019-06-09 DIAGNOSIS — Z20822 Contact with and (suspected) exposure to covid-19: Secondary | ICD-10-CM

## 2019-06-09 DIAGNOSIS — R6889 Other general symptoms and signs: Secondary | ICD-10-CM | POA: Diagnosis not present

## 2019-06-10 ENCOUNTER — Other Ambulatory Visit: Payer: Self-pay

## 2019-06-10 LAB — NOVEL CORONAVIRUS, NAA: SARS-CoV-2, NAA: NOT DETECTED

## 2019-06-10 NOTE — Patient Outreach (Signed)
[facility]) Care Management  06/10/2019  PRISCYLLA MENIFEE 1950-11-15 [id number]  Medication Adherence call to Mrs. [place] Compliant Voice message left with a call back number. Mrs.Scalf is showing past due on Metformin 500 mg ,Atorvastatin 40 mg and Losartan/Hctz 100/25 mg under [place].   [facility] Management Direct Dial [phone]  Fax [phone] [email or web address]

## 2019-06-17 ENCOUNTER — Other Ambulatory Visit: Payer: Self-pay | Admitting: Physical Medicine & Rehabilitation

## 2019-06-17 ENCOUNTER — Other Ambulatory Visit: Payer: Self-pay | Admitting: Internal Medicine

## 2019-06-18 ENCOUNTER — Encounter (HOSPITAL_COMMUNITY): Payer: Self-pay | Admitting: Emergency Medicine

## 2019-06-18 ENCOUNTER — Other Ambulatory Visit: Payer: Self-pay

## 2019-06-18 ENCOUNTER — Emergency Department (HOSPITAL_COMMUNITY): Payer: Medicare Other

## 2019-06-18 DIAGNOSIS — F17210 Nicotine dependence, cigarettes, uncomplicated: Secondary | ICD-10-CM | POA: Diagnosis not present

## 2019-06-18 DIAGNOSIS — E119 Type 2 diabetes mellitus without complications: Secondary | ICD-10-CM | POA: Insufficient documentation

## 2019-06-18 DIAGNOSIS — Z7982 Long term (current) use of aspirin: Secondary | ICD-10-CM | POA: Insufficient documentation

## 2019-06-18 DIAGNOSIS — I259 Chronic ischemic heart disease, unspecified: Secondary | ICD-10-CM | POA: Diagnosis not present

## 2019-06-18 DIAGNOSIS — M25062 Hemarthrosis, left knee: Secondary | ICD-10-CM | POA: Diagnosis not present

## 2019-06-18 DIAGNOSIS — Z8505 Personal history of malignant neoplasm of liver: Secondary | ICD-10-CM | POA: Insufficient documentation

## 2019-06-18 DIAGNOSIS — M25562 Pain in left knee: Secondary | ICD-10-CM | POA: Diagnosis present

## 2019-06-18 DIAGNOSIS — Z7984 Long term (current) use of oral hypoglycemic drugs: Secondary | ICD-10-CM | POA: Diagnosis not present

## 2019-06-18 DIAGNOSIS — I10 Essential (primary) hypertension: Secondary | ICD-10-CM | POA: Diagnosis not present

## 2019-06-18 DIAGNOSIS — Z951 Presence of aortocoronary bypass graft: Secondary | ICD-10-CM | POA: Diagnosis not present

## 2019-06-18 DIAGNOSIS — Z79899 Other long term (current) drug therapy: Secondary | ICD-10-CM | POA: Diagnosis not present

## 2019-06-18 DIAGNOSIS — M25462 Effusion, left knee: Secondary | ICD-10-CM | POA: Diagnosis not present

## 2019-06-18 NOTE — ED Triage Notes (Addendum)
Patient c/o pain and swelling to left knee this evening. Denies injury. Hx arthritis. Reports pain worsens with movement.

## 2019-06-18 NOTE — Telephone Encounter (Signed)
Tramadol not refilled, note 12/20/2018 states We will discontinue her tramadol this could certainly contribute to fall risk.

## 2019-06-19 ENCOUNTER — Emergency Department (HOSPITAL_COMMUNITY)
Admission: EM | Admit: 2019-06-19 | Discharge: 2019-06-19 | Disposition: A | Discharge status: Home / Self Care | Payer: Medicare Other | Attending: Emergency Medicine | Admitting: Emergency Medicine

## 2019-06-19 DIAGNOSIS — M25062 Hemarthrosis, left knee: Secondary | ICD-10-CM | POA: Diagnosis not present

## 2019-06-19 LAB — GRAM STAIN

## 2019-06-19 LAB — SYNOVIAL CELL COUNT + DIFF, W/ CRYSTALS
Crystals, Fluid: NONE SEEN
Lymphocytes-Synovial Fld: 15 % (ref 0–20)
Monocyte-Macrophage-Synovial Fluid: 5 % — ABNORMAL LOW (ref 50–90)
Neutrophil, Synovial: 80 % — ABNORMAL HIGH (ref 0–25)
WBC, Synovial: 6560 /mm3 — ABNORMAL HIGH (ref 0–200)

## 2019-06-19 MED ORDER — HYDROCODONE-ACETAMINOPHEN 5-325 MG PO TABS
1.0000 | ORAL_TABLET | Freq: Once | ORAL | Status: AC
  Administered 2019-06-19: 01:00:00 1 via ORAL
  Filled 2019-06-19: qty 1

## 2019-06-19 MED ORDER — LIDOCAINE-EPINEPHRINE 2 %-1:100000 IJ SOLN
20.0000 mL | Freq: Once | INTRAMUSCULAR | Status: AC
  Administered 2019-06-19: 4.5 mL via INTRADERMAL
  Filled 2019-06-19: qty 1

## 2019-06-19 NOTE — ED Provider Notes (Addendum)
[facility] DEPT Provider Note: Georgena Spurling, MD, FACEP  CSN: [id number] MRN: [id number] ARRIVAL: 06/18/19 at 2141 ROOM: [place]  Knee Pain   HISTORY OF PRESENT ILLNESS  06/19/19 12:27 AM Theresa Cline is a 68 y.o. female who awoke from a nap yesterday evening about 5:30 PM with pain and swelling in her left knee.  She denies trauma.  She rates her pain as a 10 out of 10, worse with movement or attempted weightbearing.  It is difficult for her to ambulate.  There is no associated fever, warmth or erythema.  She has no history of gout.    Past Medical History:  Diagnosis Date  . Anemia, iron deficiency: Severe 07/29/2016  . Anxiety   . Bronchitis, acute   . CAD (coronary artery disease)   . Cancer [facility])    Renal s/p nephrectomy  . CARPAL TUNNEL RELEASE, BILATERAL, HX OF 08/09/1998   Qualifier: Diagnosis of  By: Amil Amen MD, Benjamine Mola    . Chronic pain syndrome   . Diabetes mellitus type II, controlled ([place])   . Diabetes type 2, controlled ([place])   . Diabetic peripheral neuropathy ([place])   . Encounter for long-term (current) use of other medications   . GERD (gastroesophageal reflux disease)   . Hypertension   . 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 Mixed hyperlipidemia   . Obesity   . Osteoporosis   . Primary osteoarthritis of both knees   . S/P carpal tunnel release   . Sleep apnea    does not use CPAP  . Tobacco abuse   . Vertigo     Past Surgical History:  Procedure Laterality Date  . BREAST LUMPECTOMY Left 2017   benign  . BREAST LUMPECTOMY WITH RADIOACTIVE SEED LOCALIZATION Left 11/30/2015   Procedure: BREAST LUMPECTOMY WITH RADIOACTIVE SEED LOCALIZATION;  Surgeon: Erroll Luna, MD;  Location: [facility];  Service: General;  Laterality: Left;  . CARPAL TUNNEL RELEASE Right 08/09/1998  . CARPAL TUNNEL RELEASE Left 09/08/1998  . CESAREAN SECTION     x3  . CORONARY ANGIOPLASTY WITH STENT PLACEMENT  2004   Normal LV  function  . LIPOMA EXCISION Right    Right foot  . Resection of Left Acoustic Neuroma  1999  . TOTAL ABDOMINAL HYSTERECTOMY W/ BILATERAL SALPINGOOPHORECTOMY  04/12/1988   For fibroid tumors; [place], [place]    Family History  Problem Relation Age of Onset  . Leukemia Mother        CLL  . Diabetes type II Mother   . Other Mother        Hypercholesterolemia  . Osteoarthritis Mother   . COPD Mother   . Hypertension Mother   . Hodgkin's lymphoma Father   . Diabetes type II Sister   . Osteoporosis Sister   . Paranoid behavior Brother        Unsure if diagnosed with Schizophrenia  . Diabetes type II Sister   . Hypertension Sister   . Diabetes type II Sister   . Hypertension Sister   . Schizophrenia Sister   . Heart disease Sister        Enlarged heart  . Glaucoma Sister   . Alcohol abuse Sister   . Glaucoma Sister   . Alcohol abuse Sister   . Bipolar disorder Daughter   . Lupus Daughter        SLE  . Healthy Daughter     Social History   Tobacco  Use  . Smoking status: Current Every Day Smoker    Packs/day: 1.50    Years: 50.00    Pack years: 75.00    Types: Cigarettes    Last attempt to quit: 11/05/2017    Years since quitting: 1.6  . Smokeless tobacco: Never Used  . Tobacco comment: 09/26/17- smoking 1 ppwk//lmr  Substance Use Topics  . Alcohol use: No    Alcohol/week: 0.0 standard drinks  . Drug use: No    Prior to Admission medications   Medication Sig Start Date End Date Taking? Authorizing Provider  Accu-Chek FastClix Lancets MISC USE TO TEST BLOOD SUGAR THREE TIMES A DAY AND EVERY NIGHT AT BEDTIME 06/18/19   Burns, Claudina Lick, MD  ACCU-CHEK SMARTVIEW test strip USE TO TEST BLOOD SUGAR THREE TIMES A DAY AND EVERY NIGHT AT BEDTIME 03/10/19   Binnie Rail, MD  aspirin EC 81 MG tablet Take 81 mg by mouth at bedtime.     [provider]  atorvastatin (LIPITOR) 40 MG tablet TAKE 1 TABLET BY MOUTH ONCE DAILY Patient taking differently: Take 40 mg by mouth  daily.  04/14/19   Binnie Rail, MD  Azelastine-Fluticasone 137-50 MCG/ACT SUSP Place 1 spray into the nose 2 (two) times a day. 03/20/19   Binnie Rail, MD  BIOTIN PO Take 1 tablet by mouth daily with breakfast.    [provider]  Cholecalciferol (VITAMIN D) 2000 UNITS CAPS Take 2,000 Units by mouth daily with breakfast.     [provider]  diclofenac sodium (VOLTAREN) 1 % GEL Apply 4 g topically 4 (four) times daily. 08/14/18   Binnie Rail, MD  DULoxetine (CYMBALTA) 30 MG capsule TAKE 1 CAPSULE BY MOUTH ONCE DAILY Patient taking differently: Take 30 mg by mouth daily.  04/14/19   Burns, Claudina Lick, MD  fluticasone furoate-vilanterol (BREO ELLIPTA) 100-25 MCG/INH AEPB Inhale 1 puff into the lungs daily. 03/20/19   Binnie Rail, MD  hydrALAZINE (APRESOLINE) 25 MG tablet TAKE 3 TABLETS BY MOUTH 3 TIMES DAILY Patient taking differently: Take 75 mg by mouth 3 (three) times daily.  04/14/19   Binnie Rail, MD  lidocaine (XYLOCAINE) 5 % ointment APPLY TOPICALLY THREE TIMES A DAY AS NEEDED 05/08/16   Kirsteins, Luanna Salk, MD  losartan-hydrochlorothiazide (HYZAAR) 100-25 MG tablet TAKE 1 TABLET BY MOUTH ONCE DAILY 04/14/19   Binnie Rail, MD  metoprolol succinate (TOPROL-XL) 100 MG 24 hr tablet TAKE 1/2 TABLET BY MOUTH TWICE DAILY. TAKE WITH OR IMMEDIATELY FOLLOWING A MEAL **OFFICE VISIT NEED FOR FURTHER REFILLS** 04/14/19   Binnie Rail, MD  Multiple Vitamin (MULTI-VITAMIN DAILY PO) Take 1 tablet by mouth daily.    [provider]  nitroGLYCERIN (NITROLINGUAL) 0.4 MG/SPRAY spray Place 1 spray under the tongue every 5 (five) minutes x 3 doses as needed for chest pain. 03/20/19   Burns, Claudina Lick, MD  omeprazole (PRILOSEC) 20 MG capsule TAKE 1 CAPSULE BY MOUTH EVERY DAY 12/10/18   Burns, Claudina Lick, MD  PROAIR HFA 108 [phone] Base) MCG/ACT inhaler INHALE 2 PUFFS BY MOUTH EVERY 6 HOURS AS NEEDED FOR WHEEZING OR SHORTNESS OF BREATH Patient taking differently: Inhale 2 puffs into the lungs every  6 (six) hours as needed for wheezing or shortness of breath.  09/11/18   Binnie Rail, MD  traMADol (ULTRAM) 50 MG tablet TAKE 1 TABLET BY MOUTH TWICE DAILY 04/11/19   Kirsteins, Luanna Salk, MD  vitamin E (VITAMIN E) 400 UNIT capsule Take  400 Units by mouth daily.    [provider]  Feige Friar 5 % SOLN  11/29/18   [provider]    Allergies Adhesive [tape], Gabapentin, and Lyrica [pregabalin]   REVIEW OF SYSTEMS  Negative except as noted here or in the History of Present Illness.   PHYSICAL EXAMINATION  Initial Vital Signs Blood pressure (!) 166/90, pulse 87, temperature 99.3 F (37.4 C), resp. rate 17, SpO2 99 %.  Examination General: Well-developed, thin female in no acute distress; appearance consistent with age of record HENT: normocephalic; atraumatic Eyes: Normal appearance Neck: supple Heart: regular rate and rhythm Lungs: clear to auscultation bilaterally Abdomen: soft; nondistended; nontender; bowel sounds present Extremities: No acute deformities; tenderness and effusion of left knee without erythema or warmth; pain on passive movement of left knee; pulses normal Neurologic: Awake, alert and oriented; motor function intact in all extremities and symmetric; no facial droop Skin: Warm and dry Psychiatric: Normal mood and affect   RESULTS  Summary of this visit's results, reviewed by myself:   EKG Interpretation  Date/Time:    Ventricular Rate:    PR Interval:    QRS Duration:   QT Interval:    QTC Calculation:   R Axis:     Text Interpretation:        Laboratory Studies: Results for orders placed or performed during the hospital encounter of 06/19/19 (from the past 24 hour(s))  Synovial cell count + diff, w/ crystals     Status: Abnormal   Collection Time: 06/19/19  1:41 AM  Result Value Ref Range   Color, Synovial RED (A) YELLOW   Appearance-Synovial TURBID (A) CLEAR   Crystals, Fluid NO CRYSTALS SEEN    WBC, Synovial 6,560 (H) 0 - 200  /cu mm   Neutrophil, Synovial 80 (H) 0 - 25 %   Lymphocytes-Synovial Fld 15 0 - 20 %   Monocyte-Macrophage-Synovial Fluid 5 (L) 50 - 90 %   Imaging Studies: Dg Knee Complete 4 Views Left  Result Date: 06/18/2019 CLINICAL DATA:  Pain and swelling EXAM: LEFT KNEE - COMPLETE 4+ VIEW COMPARISON:  None. FINDINGS: No fracture or dislocation. There is a small knee joint effusion. There is diffuse osteopenia. Scattered vascular calcifications are noted. IMPRESSION: No definite acute osseous abnormality.  Small knee joint effusion. Electronically Signed   By: Prudencio Pair M.D.   On: 06/18/2019 22:42    ED COURSE and MDM  Nursing notes and initial vitals signs, including pulse oximetry, reviewed.  Vitals:   06/19/19 0112 06/19/19 0200 06/19/19 0300 06/19/19 0311  BP:  (!) 198/98 (!) 177/97 (!) 179/90  Pulse: 72 78 72 68  Resp:    18  Temp:    97.9 F (36.6 C)  TempSrc:    Oral  SpO2: 96% 99% 99% 99%   4:12 AM Patient's knee feeling much better after arthrocentesis.  The cause of her hemarthrosis is unclear as she denies trauma.  She may have had some minor trauma that caused bleeding.  There were no crystals to suggest gout or pseudogout and white count was only 6560, not consistent with a septic joint.  She has a walker at home to assist with ambulation.  She is a patient of Dr. Alma Friendly of [facility] and will follow up with him as needed.  PROCEDURES  .Joint Aspiration/Arthrocentesis  Date/Time: 06/19/2019 1:41 AM Performed by: Belva Koziel, MD Authorized by: Zerina Hallinan, MD   Consent:    Consent obtained:  Verbal   Consent given by:  Patient   Alternatives discussed:  No treatment Location:    Location:  Knee   Knee:  L knee Anesthesia (see MAR for exact dosages):    Anesthesia method:  Local infiltration   Local anesthetic:  Lidocaine 2% WITH epi Procedure details:    Preparation: Patient was prepped and draped in usual sterile fashion     Needle gauge:  18 G   Ultrasound  guidance: no     Approach:  Lateral   Aspirate characteristics:  Bloody   Steroid injected: no     Specimen collected: yes   Post-procedure details:    Dressing:  Adhesive bandage   Patient tolerance of procedure:  Tolerated well, no immediate complications   ED DIAGNOSES     ICD-10-CM   1. Hemarthrosis of left knee  M25.062        Farooq Petrovich, Jenny Reichmann, MD 06/19/19 0402    Shanon Rosser, MD 06/19/19 [phone]

## 2019-06-19 NOTE — ED Notes (Signed)
Laceration tray, 60cc luer lock syringe and 18g needle at bedside per verbal order from Dr.Molpus.

## 2019-06-20 ENCOUNTER — Encounter: Payer: Medicare Other | Attending: Physical Medicine & Rehabilitation | Admitting: Physical Medicine & Rehabilitation

## 2019-06-20 ENCOUNTER — Other Ambulatory Visit: Payer: Self-pay

## 2019-06-20 ENCOUNTER — Encounter: Payer: Self-pay | Admitting: Physical Medicine & Rehabilitation

## 2019-06-20 VITALS — BP 125/88 | HR 88 | Temp 98.5°F | Ht 63.0 in | Wt 130.8 lb

## 2019-06-20 DIAGNOSIS — Z5181 Encounter for therapeutic drug level monitoring: Secondary | ICD-10-CM

## 2019-06-20 DIAGNOSIS — G894 Chronic pain syndrome: Secondary | ICD-10-CM

## 2019-06-20 DIAGNOSIS — Z79891 Long term (current) use of opiate analgesic: Secondary | ICD-10-CM | POA: Diagnosis not present

## 2019-06-20 DIAGNOSIS — M48061 Spinal stenosis, lumbar region without neurogenic claudication: Secondary | ICD-10-CM | POA: Diagnosis not present

## 2019-06-20 NOTE — Patient Instructions (Signed)
Call Dr Drexel [facility] [place]

## 2019-06-20 NOTE — Progress Notes (Signed)
Subjective:    Patient ID: Theresa Cline, female    DOB: 12-Apr-1951, 68 y.o.   MRN: [id number]  HPI  68 year old female with history of lumbar spinal stenosis.  She also has a history of renal tumor requiring nephrectomy which was picked up as an incidental finding on her lumbar MRI 10 years ago. She has a history of diabetes with lower extremity numbness.  She does have lumbar stenosis at L4-L5 that is graded as severe on the most recent MRI in 2014. Despite this she has been very active lost 90 pounds, can walk for an hour straight Pt going to move to [place]   Packed up her own house in preparation of move.  Also took a 1 hr walk.  She woke up with a swollen left knee Seen in ED for left knee effusion, Xrays negative, arthro centesis  Still has numbness in feet, hgbA1C now 5.9 Cholesterol is normal Off diabetic meds after 90lb wt loss  Pain Inventory Average Pain 0 Pain Right Now 0 My pain is na  In the last 24 hours, has pain interfered with the following? General activity 0 Relation with others 0 Enjoyment of life 0 What TIME of day is your pain at its worst? na Sleep (in general) Fair  Pain is worse with: na Pain improves with: na Relief from Meds: na  Mobility use a cane how many minutes can you walk? 45-60  Function retired  Neuro/Psych numbness trouble walking dizziness confusion loss of taste or smell  Prior Studies Any changes since last visit?  no x-rays  Physicians involved in your care Orthopedist Dr. Veverly Fells   Family History  Problem Relation Age of Onset  . Leukemia Mother        CLL  . Diabetes type II Mother   . Other Mother        Hypercholesterolemia  . Osteoarthritis Mother   . COPD Mother   . Hypertension Mother   . Hodgkin's lymphoma Father   . Diabetes type II Sister   . Osteoporosis Sister   . Paranoid behavior Brother        Unsure if diagnosed with Schizophrenia  . Diabetes type II Sister   . Hypertension Sister    . Diabetes type II Sister   . Hypertension Sister   . Schizophrenia Sister   . Heart disease Sister        Enlarged heart  . Glaucoma Sister   . Alcohol abuse Sister   . Glaucoma Sister   . Alcohol abuse Sister   . Bipolar disorder Daughter   . Lupus Daughter        SLE  . Healthy Daughter    Social History   Socioeconomic History  . Marital status: Divorced    Spouse name: Not on file  . Number of children: 1  . Years of education: Not on file  . Highest education level: Not on file  Occupational History  . Occupation: Barrister's clerk in hotel    Comment: Disabled  . Occupation: Retail    Comment: Disabled  Social Needs  . Financial resource strain: Not very hard  . Food insecurity    Worry: Never true    Inability: Never true  . Transportation needs    Medical: No    Non-medical: No  Tobacco Use  . Smoking status: Current Every Day Smoker    Packs/day: 1.50    Years: 50.00    Pack years: 75.00    Types: Cigarettes  Last attempt to quit: 11/05/2017    Years since quitting: 1.6  . Smokeless tobacco: Never Used  . Tobacco comment: 09/26/17- smoking 1 ppwk//lmr  Substance and Sexual Activity  . Alcohol use: No    Alcohol/week: 0.0 standard drinks  . Drug use: No  . Sexual activity: Never  Lifestyle  . Physical activity    Days per week: 4 days    Minutes per session: 40 min  . Stress: Not at all  Relationships  . Social connections    Talks on phone: More than three times a week    Gets together: More than three times a week    Attends religious service: 1 to 4 times per year    Active member of club or organization: Yes    Attends meetings of clubs or organizations: More than 4 times per year    Relationship status: Divorced  Other Topics Concern  . Not on file  Social History Narrative   Divorced   Disability mainly for back and chronic intermittent vertigo - since tumor removed - no surgical correction for back   Lives alone, keeps her grandson, 27  yr old daughter is a mother   Past Surgical History:  Procedure Laterality Date  . BREAST LUMPECTOMY Left 2017   benign  . BREAST LUMPECTOMY WITH RADIOACTIVE SEED LOCALIZATION Left 11/30/2015   Procedure: BREAST LUMPECTOMY WITH RADIOACTIVE SEED LOCALIZATION;  Surgeon: Erroll Luna, MD;  Location: [facility];  Service: General;  Laterality: Left;  . CARPAL TUNNEL RELEASE Right 08/09/1998  . CARPAL TUNNEL RELEASE Left 09/08/1998  . CESAREAN SECTION     x3  . CORONARY ANGIOPLASTY WITH STENT PLACEMENT  2004   Normal LV function  . LIPOMA EXCISION Right    Right foot  . Resection of Left Acoustic Neuroma  1999  . TOTAL ABDOMINAL HYSTERECTOMY W/ BILATERAL SALPINGOOPHORECTOMY  04/12/1988   For fibroid tumors; [place], [place]   Past Medical History:  Diagnosis Date  . Anemia, iron deficiency: Severe 07/29/2016  . Anxiety   . Bronchitis, acute   . CAD (coronary artery disease)   . Cancer [facility])    Renal s/p nephrectomy  . CARPAL TUNNEL RELEASE, BILATERAL, HX OF 08/09/1998   Qualifier: Diagnosis of  By: Amil Amen MD, Benjamine Mola    . Chronic pain syndrome   . Diabetes mellitus type II, controlled ([place])   . Diabetes type 2, controlled ([place])   . Diabetic peripheral neuropathy ([place])   . Encounter for long-term (current) use of other medications   . GERD (gastroesophageal reflux disease)   . Hypertension   . 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 Mixed hyperlipidemia   . Obesity   . Osteoporosis   . Primary osteoarthritis of both knees   . S/P carpal tunnel release   . Sleep apnea    does not use CPAP  . Tobacco abuse   . Vertigo    There were no vitals taken for this visit.  Opioid Risk Score:   Fall Risk Score:  `1  Depression screen PHQ 2/9  Depression screen [facility] 2/9 07/24/2018 06/21/2018 07/04/2017 03/21/2017 07/28/2016 05/06/2015 09/25/2014  Decreased Interest 0 0 0 0 0 0 0  Down, Depressed, Hopeless 0 0 0 0 0 0 0  PHQ - 2 Score 0 0 0 0 0 0 0  Altered  sleeping - - - - - 3 -  Tired, decreased energy - - - - - 0 -  Change in  appetite - - - - - 3 -  Feeling bad or failure about yourself  - - - - - 0 -  Trouble concentrating - - - - - 2 -  Moving slowly or fidgety/restless - - - - - 0 -  Suicidal thoughts - - - - - 0 -  PHQ-9 Score - - - - - 8 -  Some recent data might be hidden     Review of Systems  Constitutional: Negative.   HENT: Negative.   Eyes: Negative.   Respiratory: Positive for apnea.   Cardiovascular: Negative.   Gastrointestinal: Positive for nausea.  Endocrine: Negative.   Genitourinary: Negative.   Musculoskeletal: Positive for gait problem.  Skin: Negative.   Neurological: Positive for numbness.  Hematological: Negative.   Psychiatric/Behavioral: Negative.   All other systems reviewed and are negative.      Objective:   Physical Exam Well-developed well-nourished female no acute distress Patient has normal sensation to pinprick and light touch bilateral lower limbs Negative straight leg raising Motor strength is 5/5 bilateral hip flexor knee extensor ankle dorsiflexor Lumbar spine has no tenderness palpation the patient has normal lumbar flexion extension lateral bending and rotation. Gait is without evidence of toe drag or knee instability.      Assessment & Plan:  1.  Lumbar spinal stenosis her low back pain is doing very well.  She has in the past had lumbar medial branch blocks and RF.  Has not required this for quite some time.  She is off of stronger narcotic analgesics and only takes about one half tramadol at night.  She has lost 90 pounds and has been more active physically as well.  2.  Lower extremity numbness she does have history of diabetic neuropathy although she is now off of medications with essentially normal hemoglobin A1c.  In addition she does have lumbar spinal stenosis at the 99991111 level which certainly can cause lower extremity numbness.  Fortunately she does not have neuropathic pain  at this time is with excess edema due to both gabapentin and Lyrica and these have not been prescribed for approximately 5 years  We did check a UDS today, results are pending.  She has not had any problems with UDS in the past.  And as stated her narcotic medication is tramadol 25 mg nightly prn [place] I have given her the name of Dr. Drexel Iha physical medicine rehab She will also look for new primary care in that area. I will see her back on a as needed basis

## 2019-06-23 ENCOUNTER — Ambulatory Visit: Payer: Medicare Other | Admitting: Physical Medicine & Rehabilitation

## 2019-06-24 DIAGNOSIS — M1712 Unilateral primary osteoarthritis, left knee: Secondary | ICD-10-CM | POA: Diagnosis not present

## 2019-06-24 DIAGNOSIS — M25562 Pain in left knee: Secondary | ICD-10-CM | POA: Diagnosis not present

## 2019-06-24 LAB — CULTURE, BODY FLUID W GRAM STAIN -BOTTLE: Culture: NO GROWTH

## 2019-06-24 LAB — TOXASSURE SELECT,+ANTIDEPR,UR

## 2019-06-25 ENCOUNTER — Telehealth: Payer: Self-pay | Admitting: *Deleted

## 2019-06-25 NOTE — Telephone Encounter (Signed)
Urine drug screen for this encounter is consistent for prescribed medication 

## 2019-06-26 NOTE — Progress Notes (Signed)
Subjective:    Patient ID: Theresa Cline, female    DOB: 06-Mar-1951, 68 y.o.   MRN: [id number]  HPI The patient is here for follow up.   She is moving to [place] this month.  She will move in with her family.  She is feeling great.     CAD, Hypertension: She is taking her medication daily. She is compliant with a low sodium diet.  She denies chest pain, palpitations, edema, shortness of breath and regular headaches. She does not monitor her blood pressure at home.    Diabetes with neuropathy: She is taking her medication daily as prescribed. She is compliant with a diabetic diet.  She monitors her sugars and they have been running 97 this morning. She denies numbness/tingling in her feet and foot lesions. She is up-to-date with an ophthalmology examination.   Hyperlipidemia: She is taking her medication daily. She is compliant with a low fat/cholesterol diet. She denies myalgias.   GERD:  She is taking her medication daily as prescribed.  She denies any GERD symptoms and feels her GERD is well controlled.   Spinal stenosis:  She follows with pain management.  She takes tramadol and cymbalta.    Left knee pain:  She saw orthopedics for a flair of her arthritis.  she had an effusion and the fluid was remved and had an infection last week.  She is taking tramadol at night for that.    COPD:  She uses the [facility] daily.  She uses her albuterol inhaler as needed.  She still smokes - she is only smoking 1 cig a week now and will quit officially when she moves.     Medications and allergies reviewed with patient and updated if appropriate.  Patient Active Problem List   Diagnosis Date Noted  . Left shoulder pain 08/14/2018  . COPD GOLD III still smoking 11/12/2017  . Cigarette smoker 09/26/2017  . Abnormal chest xray 08/22/2017  . History of renal cell carcinoma 02/05/2017  . Lightheadedness 07/29/2016  . DM type 2, controlled, with complication ([place]) [id number]  . Osteopenia  03/15/2016  . Facet arthropathy, lumbosacral 02/24/2016  . GERD (gastroesophageal reflux disease) 02/09/2016  . Acoustic neuroma ([place]) 02/09/2016  . Right knee DJD 01/18/2012  . Trochanteric bursitis of both hips 12/11/2011  . Spinal stenosis, lumbar region, without neurogenic claudication 12/11/2011  . Syncope 07/17/2011  . Dizziness and giddiness 11/06/2008  . Diabetic neuropathy associated with type 2 diabetes mellitus ([place]) 10/15/2007  . Mixed hyperlipidemia 10/15/2007  . Essential hypertension 10/15/2007  . Coronary atherosclerosis 10/15/2007  . Osteoarthrosis, unspecified whether generalized or localized, involving lower leg 10/15/2007    Current Outpatient Medications on File Prior to Visit  Medication Sig Dispense Refill  . Accu-Chek FastClix Lancets MISC USE TO TEST BLOOD SUGAR THREE TIMES A DAY AND EVERY NIGHT AT BEDTIME 102 each 0  . ACCU-CHEK SMARTVIEW test strip USE TO TEST BLOOD SUGAR THREE TIMES A DAY AND EVERY NIGHT AT BEDTIME 100 each 1  . aspirin EC 81 MG tablet Take 81 mg by mouth at bedtime.     Marland Kitchen atorvastatin (LIPITOR) 40 MG tablet TAKE 1 TABLET BY MOUTH ONCE DAILY (Patient taking differently: Take 40 mg by mouth daily. ) 90 tablet 0  . Azelastine-Fluticasone 137-50 MCG/ACT SUSP Place 1 spray into the nose 2 (two) times a day. 23 g 5  . BIOTIN PO Take 1 tablet by mouth daily with breakfast.    . Cholecalciferol (VITAMIN D)  2000 UNITS CAPS Take 2,000 Units by mouth daily with breakfast.     . diclofenac sodium (VOLTAREN) 1 % GEL Apply 4 g topically 4 (four) times daily. 100 g 5  . DULoxetine (CYMBALTA) 30 MG capsule TAKE 1 CAPSULE BY MOUTH ONCE DAILY (Patient taking differently: Take 30 mg by mouth daily. ) 90 capsule 0  . fluticasone furoate-vilanterol (BREO ELLIPTA) 100-25 MCG/INH AEPB Inhale 1 puff into the lungs daily. 28 each 5  . hydrALAZINE (APRESOLINE) 25 MG tablet TAKE 3 TABLETS BY MOUTH 3 TIMES DAILY (Patient taking differently: Take 75 mg by mouth 3 (three)  times daily. ) 810 tablet 0  . lidocaine (XYLOCAINE) 5 % ointment APPLY TOPICALLY THREE TIMES A DAY AS NEEDED 100 g 1  . losartan-hydrochlorothiazide (HYZAAR) 100-25 MG tablet TAKE 1 TABLET BY MOUTH ONCE DAILY 30 tablet 0  . metoprolol succinate (TOPROL-XL) 100 MG 24 hr tablet TAKE 1/2 TABLET BY MOUTH TWICE DAILY. TAKE WITH OR IMMEDIATELY FOLLOWING A MEAL **OFFICE VISIT NEED FOR FURTHER REFILLS** 90 tablet 0  . Multiple Vitamin (MULTI-VITAMIN DAILY PO) Take 1 tablet by mouth daily.    . nitroGLYCERIN (NITROLINGUAL) 0.4 MG/SPRAY spray Place 1 spray under the tongue every 5 (five) minutes x 3 doses as needed for chest pain. 12 g 1  . omeprazole (PRILOSEC) 20 MG capsule TAKE 1 CAPSULE BY MOUTH EVERY DAY 90 capsule 3  . PROAIR HFA 108 (90 Base) MCG/ACT inhaler INHALE 2 PUFFS BY MOUTH EVERY 6 HOURS AS NEEDED FOR WHEEZING OR SHORTNESS OF BREATH (Patient taking differently: Inhale 2 puffs into the lungs every 6 (six) hours as needed for wheezing or shortness of breath. ) 17 each 1  . traMADol (ULTRAM) 50 MG tablet TAKE 1 TABLET BY MOUTH TWICE DAILY 60 tablet 1  . vitamin E (VITAMIN E) 400 UNIT capsule Take 400 Units by mouth daily.    Marland Kitchen XIIDRA 5 % SOLN      No current facility-administered medications on file prior to visit.     Past Medical History:  Diagnosis Date  . Anemia, iron deficiency: Severe 07/29/2016  . Anxiety   . Bronchitis, acute   . CAD (coronary artery disease)   . Cancer [facility])    Renal s/p nephrectomy  . CARPAL TUNNEL RELEASE, BILATERAL, HX OF 08/09/1998   Qualifier: Diagnosis of  By: Amil Amen MD, Benjamine Mola    . Chronic pain syndrome   . Diabetes mellitus type II, controlled ([place])   . Diabetes type 2, controlled ([place])   . Diabetic peripheral neuropathy ([place])   . Encounter for long-term (current) use of other medications   . GERD (gastroesophageal reflux disease)   . Hypertension   . 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 Mixed hyperlipidemia   . Obesity   . Osteoporosis    . Primary osteoarthritis of both knees   . S/P carpal tunnel release   . Sleep apnea    does not use CPAP  . Tobacco abuse   . Vertigo     Past Surgical History:  Procedure Laterality Date  . BREAST LUMPECTOMY Left 2017   benign  . BREAST LUMPECTOMY WITH RADIOACTIVE SEED LOCALIZATION Left 11/30/2015   Procedure: BREAST LUMPECTOMY WITH RADIOACTIVE SEED LOCALIZATION;  Surgeon: Erroll Luna, MD;  Location: [facility];  Service: General;  Laterality: Left;  . CARPAL TUNNEL RELEASE Right 08/09/1998  . CARPAL TUNNEL RELEASE Left 09/08/1998  . CESAREAN SECTION     x3  . CORONARY  ANGIOPLASTY WITH STENT PLACEMENT  2004   Normal LV function  . LIPOMA EXCISION Right    Right foot  . Resection of Left Acoustic Neuroma  1999  . TOTAL ABDOMINAL HYSTERECTOMY W/ BILATERAL SALPINGOOPHORECTOMY  04/12/1988   For fibroid tumors; [place], [place]    Social History   Socioeconomic History  . Marital status: Divorced    Spouse name: Not on file  . Number of children: 1  . Years of education: Not on file  . Highest education level: Not on file  Occupational History  . Occupation: Barrister's clerk in hotel    Comment: Disabled  . Occupation: Retail    Comment: Disabled  Social Needs  . Financial resource strain: Not very hard  . Food insecurity    Worry: Never true    Inability: Never true  . Transportation needs    Medical: No    Non-medical: No  Tobacco Use  . Smoking status: Current Every Day Smoker    Packs/day: 1.50    Years: 50.00    Pack years: 75.00    Types: Cigarettes    Last attempt to quit: 11/05/2017    Years since quitting: 1.6  . Smokeless tobacco: Never Used  . Tobacco comment: 09/26/17- smoking 1 ppwk//lmr  Substance and Sexual Activity  . Alcohol use: No    Alcohol/week: 0.0 standard drinks  . Drug use: No  . Sexual activity: Never  Lifestyle  . Physical activity    Days per week: 4 days    Minutes per session: 40 min  . Stress: Not at all   Relationships  . Social connections    Talks on phone: More than three times a week    Gets together: More than three times a week    Attends religious service: 1 to 4 times per year    Active member of club or organization: Yes    Attends meetings of clubs or organizations: More than 4 times per year    Relationship status: Divorced  Other Topics Concern  . Not on file  Social History Narrative   Divorced   Disability mainly for back and chronic intermittent vertigo - since tumor removed - no surgical correction for back   Lives alone, keeps her grandson, 38 yr old daughter is a mother    Family History  Problem Relation Age of Onset  . Leukemia Mother        CLL  . Diabetes type II Mother   . Other Mother        Hypercholesterolemia  . Osteoarthritis Mother   . COPD Mother   . Hypertension Mother   . Hodgkin's lymphoma Father   . Diabetes type II Sister   . Osteoporosis Sister   . Paranoid behavior Brother        Unsure if diagnosed with Schizophrenia  . Diabetes type II Sister   . Hypertension Sister   . Diabetes type II Sister   . Hypertension Sister   . Schizophrenia Sister   . Heart disease Sister        Enlarged heart  . Glaucoma Sister   . Alcohol abuse Sister   . Glaucoma Sister   . Alcohol abuse Sister   . Bipolar disorder Daughter   . Lupus Daughter        SLE  . Healthy Daughter     Review of Systems  Constitutional: Negative for chills and fever.  Respiratory: Negative for cough, shortness of breath and wheezing.   Cardiovascular:  Negative for chest pain, palpitations and leg swelling.  Neurological: Negative for light-headedness and headaches.       Objective:   Vitals:   06/27/19 0842  BP: (!) 142/80  Pulse: 60  Resp: 16  Temp: 98.7 F (37.1 C)  SpO2: 98%   BP Readings from Last 3 Encounters:  06/27/19 (!) 142/80  06/20/19 125/88  06/19/19 (!) 151/91   Wt Readings from Last 3 Encounters:  06/27/19 137 lb (62.1 kg)  06/20/19 130  lb 12.8 oz (59.3 kg)  03/20/19 125 lb (56.7 kg)   Body mass index is 24.27 kg/m.   Physical Exam    Constitutional: Appears well-developed and well-nourished. No distress.  HENT:  Head: Normocephalic and atraumatic.  Neck: Neck supple. No tracheal deviation present. No thyromegaly present.  No cervical lymphadenopathy Cardiovascular: Normal rate, regular rhythm and normal heart sounds.  No murmur heard. No carotid bruit .  No edema Pulmonary/Chest: Effort normal and breath sounds normal. No respiratory distress. No has no wheezes. No rales.  Skin: Skin is warm and dry. Not diaphoretic.  Psychiatric: Normal mood and affect. Behavior is normal.      Assessment & Plan:    See Problem List for Assessment and Plan of chronic medical problems.

## 2019-06-26 NOTE — Patient Instructions (Addendum)
  Flu immunization administered today.    Medications reviewed and updated.  Changes include :   None

## 2019-06-27 ENCOUNTER — Ambulatory Visit (INDEPENDENT_AMBULATORY_CARE_PROVIDER_SITE_OTHER): Payer: Medicare Other | Admitting: Internal Medicine

## 2019-06-27 ENCOUNTER — Encounter: Payer: Self-pay | Admitting: Internal Medicine

## 2019-06-27 ENCOUNTER — Other Ambulatory Visit: Payer: Self-pay

## 2019-06-27 VITALS — BP 142/80 | HR 60 | Temp 98.7°F | Resp 16 | Ht 63.0 in | Wt 137.0 lb

## 2019-06-27 DIAGNOSIS — I2510 Atherosclerotic heart disease of native coronary artery without angina pectoris: Secondary | ICD-10-CM

## 2019-06-27 DIAGNOSIS — J449 Chronic obstructive pulmonary disease, unspecified: Secondary | ICD-10-CM

## 2019-06-27 DIAGNOSIS — E118 Type 2 diabetes mellitus with unspecified complications: Secondary | ICD-10-CM

## 2019-06-27 DIAGNOSIS — Z23 Encounter for immunization: Secondary | ICD-10-CM | POA: Diagnosis not present

## 2019-06-27 DIAGNOSIS — K219 Gastro-esophageal reflux disease without esophagitis: Secondary | ICD-10-CM

## 2019-06-27 DIAGNOSIS — E782 Mixed hyperlipidemia: Secondary | ICD-10-CM

## 2019-06-27 DIAGNOSIS — I10 Essential (primary) hypertension: Secondary | ICD-10-CM | POA: Diagnosis not present

## 2019-06-27 DIAGNOSIS — M48061 Spinal stenosis, lumbar region without neurogenic claudication: Secondary | ICD-10-CM

## 2019-06-27 NOTE — Assessment & Plan Note (Signed)
GERD controlled Continue daily medication  

## 2019-06-27 NOTE — Assessment & Plan Note (Signed)
Following with pain management Tramadol prn cymbalta daily

## 2019-06-27 NOTE — Assessment & Plan Note (Signed)
SOB is better with [facility] - continue daily, she is using her albuterol less She has quit smoking

## 2019-06-27 NOTE — Assessment & Plan Note (Signed)
Lab Results  Component Value Date   HGBA1C 6.3 03/20/2019   Diet controlled Will hold off on labs since she is moving Low sugar / carb diet Stressed regular exercise

## 2019-06-27 NOTE — Assessment & Plan Note (Signed)
Continue daily statin Regular exercise and healthy diet encouraged  

## 2019-06-27 NOTE — Assessment & Plan Note (Signed)
BP variable, but overall controlled Continue current medication at current doses

## 2019-06-27 NOTE — Assessment & Plan Note (Signed)
No concerning symptoms of CAD/Angina Following with cardiology - will establish with a new cardiologist after her move Continue current medications

## 2019-07-14 ENCOUNTER — Other Ambulatory Visit: Payer: Self-pay

## 2019-07-14 NOTE — Patient Outreach (Signed)
[facility]) Care Management  07/14/2019  Theresa Cline [birth year] [id number]   Medication Adherence call to [name] Compliant Voice message left with a call back number. [name] is showing past due on Atorvastatin 40 mg and Metformin 500 mg under [facility].   [facility] Management Direct Dial [phone]  Fax [phone] [email or web address]

## 2019-07-30 ENCOUNTER — Ambulatory Visit: Payer: Medicare Other

## 2019-08-14 ENCOUNTER — Other Ambulatory Visit: Payer: Self-pay | Admitting: Internal Medicine

## 2019-08-25 DIAGNOSIS — I10 Essential (primary) hypertension: Secondary | ICD-10-CM | POA: Diagnosis not present

## 2019-08-25 DIAGNOSIS — E119 Type 2 diabetes mellitus without complications: Secondary | ICD-10-CM | POA: Diagnosis not present

## 2019-08-25 DIAGNOSIS — M47816 Spondylosis without myelopathy or radiculopathy, lumbar region: Secondary | ICD-10-CM | POA: Diagnosis not present

## 2019-08-25 DIAGNOSIS — Z955 Presence of coronary angioplasty implant and graft: Secondary | ICD-10-CM | POA: Diagnosis not present

## 2019-08-25 DIAGNOSIS — E7800 Pure hypercholesterolemia, unspecified: Secondary | ICD-10-CM | POA: Diagnosis not present

## 2019-08-25 DIAGNOSIS — E785 Hyperlipidemia, unspecified: Secondary | ICD-10-CM | POA: Diagnosis not present

## 2019-08-25 DIAGNOSIS — Z79899 Other long term (current) drug therapy: Secondary | ICD-10-CM | POA: Diagnosis not present

## 2019-08-25 DIAGNOSIS — I2510 Atherosclerotic heart disease of native coronary artery without angina pectoris: Secondary | ICD-10-CM | POA: Diagnosis not present

## 2019-08-25 DIAGNOSIS — I252 Old myocardial infarction: Secondary | ICD-10-CM | POA: Diagnosis not present

## 2019-08-25 DIAGNOSIS — M545 Low back pain: Secondary | ICD-10-CM | POA: Diagnosis not present

## 2019-08-25 DIAGNOSIS — R55 Syncope and collapse: Secondary | ICD-10-CM | POA: Diagnosis not present

## 2019-08-26 DIAGNOSIS — I10 Essential (primary) hypertension: Secondary | ICD-10-CM | POA: Diagnosis not present

## 2019-08-26 DIAGNOSIS — E7800 Pure hypercholesterolemia, unspecified: Secondary | ICD-10-CM | POA: Diagnosis not present

## 2019-08-26 DIAGNOSIS — I2510 Atherosclerotic heart disease of native coronary artery without angina pectoris: Secondary | ICD-10-CM | POA: Diagnosis not present

## 2019-08-26 DIAGNOSIS — R55 Syncope and collapse: Secondary | ICD-10-CM | POA: Diagnosis not present

## 2019-08-26 DIAGNOSIS — E119 Type 2 diabetes mellitus without complications: Secondary | ICD-10-CM | POA: Diagnosis not present

## 2019-08-27 DIAGNOSIS — I10 Essential (primary) hypertension: Secondary | ICD-10-CM | POA: Diagnosis not present

## 2019-08-27 DIAGNOSIS — R55 Syncope and collapse: Secondary | ICD-10-CM | POA: Diagnosis not present

## 2019-08-27 DIAGNOSIS — I2510 Atherosclerotic heart disease of native coronary artery without angina pectoris: Secondary | ICD-10-CM | POA: Diagnosis not present

## 2019-08-27 DIAGNOSIS — E7800 Pure hypercholesterolemia, unspecified: Secondary | ICD-10-CM | POA: Diagnosis not present

## 2019-08-27 DIAGNOSIS — E119 Type 2 diabetes mellitus without complications: Secondary | ICD-10-CM | POA: Diagnosis not present

## 2019-09-19 ENCOUNTER — Ambulatory Visit: Payer: Medicare Other | Admitting: Internal Medicine

## 2019-09-25 ENCOUNTER — Other Ambulatory Visit: Payer: Self-pay | Admitting: Internal Medicine

## 2019-10-13 ENCOUNTER — Other Ambulatory Visit: Payer: Self-pay | Admitting: Internal Medicine

## 2019-11-13 ENCOUNTER — Other Ambulatory Visit: Payer: Self-pay | Admitting: Internal Medicine

## 2019-12-04 NOTE — Telephone Encounter (Signed)
error 

## 2020-01-01 ENCOUNTER — Encounter: Payer: Self-pay | Admitting: General Practice

## 2020-01-17 ENCOUNTER — Other Ambulatory Visit: Payer: Self-pay | Admitting: Internal Medicine

## 2020-04-14 ENCOUNTER — Other Ambulatory Visit: Payer: Self-pay | Admitting: Internal Medicine
